# Patient Record
Sex: Female | Born: 1940
Health system: Southern US, Community
[De-identification: ages and names within clinical notes are randomized; demographics above are authoritative.]

## PROBLEM LIST (undated history)

## (undated) DIAGNOSIS — I480 Paroxysmal atrial fibrillation: Secondary | ICD-10-CM

## (undated) DIAGNOSIS — F419 Anxiety disorder, unspecified: Secondary | ICD-10-CM

## (undated) DIAGNOSIS — E079 Disorder of thyroid, unspecified: Secondary | ICD-10-CM

## (undated) DIAGNOSIS — J189 Pneumonia, unspecified organism: Secondary | ICD-10-CM

## (undated) DIAGNOSIS — F329 Major depressive disorder, single episode, unspecified: Secondary | ICD-10-CM

## (undated) DIAGNOSIS — G629 Polyneuropathy, unspecified: Secondary | ICD-10-CM

## (undated) DIAGNOSIS — C9110 Chronic lymphocytic leukemia of B-cell type not having achieved remission: Secondary | ICD-10-CM

## (undated) DIAGNOSIS — R112 Nausea with vomiting, unspecified: Secondary | ICD-10-CM

## (undated) DIAGNOSIS — F32A Depression, unspecified: Secondary | ICD-10-CM

## (undated) DIAGNOSIS — H3530 Unspecified macular degeneration: Secondary | ICD-10-CM

## (undated) DIAGNOSIS — I671 Cerebral aneurysm, nonruptured: Secondary | ICD-10-CM

## (undated) DIAGNOSIS — Z9889 Other specified postprocedural states: Secondary | ICD-10-CM

## (undated) DIAGNOSIS — M1990 Unspecified osteoarthritis, unspecified site: Secondary | ICD-10-CM

## (undated) HISTORY — DX: Paroxysmal atrial fibrillation: I48.0

## (undated) HISTORY — PX: COLONOSCOPY: SHX174

## (undated) HISTORY — DX: Cerebral aneurysm, nonruptured: I67.1

## (undated) HISTORY — PX: THYROIDECTOMY: SHX17

## (undated) HISTORY — PX: CATARACT EXTRACTION: SUR2

## (undated) HISTORY — PX: ANEURYSM COILING: SHX5349

## (undated) HISTORY — DX: Polyneuropathy, unspecified: G62.9

## (undated) HISTORY — DX: Unspecified macular degeneration: H35.30

## (undated) HISTORY — PX: CERVICAL CONE BIOPSY: SUR198

## (undated) HISTORY — PX: TONSILLECTOMY: SUR1361

---

## 1998-04-28 ENCOUNTER — Other Ambulatory Visit: Admission: RE | Admit: 1998-04-28 | Discharge: 1998-04-28 | Payer: Self-pay | Admitting: Obstetrics and Gynecology

## 1998-06-12 ENCOUNTER — Encounter: Payer: Self-pay | Admitting: Obstetrics and Gynecology

## 1998-06-13 ENCOUNTER — Other Ambulatory Visit: Admission: RE | Admit: 1998-06-13 | Discharge: 1998-06-13 | Payer: Self-pay | Admitting: Obstetrics and Gynecology

## 1998-06-14 ENCOUNTER — Ambulatory Visit (HOSPITAL_COMMUNITY): Admission: RE | Admit: 1998-06-14 | Discharge: 1998-06-14 | Payer: Self-pay | Admitting: Obstetrics and Gynecology

## 1998-09-11 ENCOUNTER — Other Ambulatory Visit: Admission: RE | Admit: 1998-09-11 | Discharge: 1998-09-11 | Payer: Self-pay | Admitting: Obstetrics and Gynecology

## 1998-12-14 ENCOUNTER — Other Ambulatory Visit: Admission: RE | Admit: 1998-12-14 | Discharge: 1998-12-14 | Payer: Self-pay | Admitting: Obstetrics and Gynecology

## 1999-03-12 ENCOUNTER — Other Ambulatory Visit: Admission: RE | Admit: 1999-03-12 | Discharge: 1999-03-12 | Payer: Self-pay | Admitting: Obstetrics and Gynecology

## 1999-06-05 ENCOUNTER — Other Ambulatory Visit: Admission: RE | Admit: 1999-06-05 | Discharge: 1999-06-05 | Payer: Self-pay | Admitting: Obstetrics and Gynecology

## 1999-07-04 ENCOUNTER — Encounter: Payer: Self-pay | Admitting: Obstetrics and Gynecology

## 1999-07-04 ENCOUNTER — Encounter: Admission: RE | Admit: 1999-07-04 | Discharge: 1999-07-04 | Payer: Self-pay | Admitting: Obstetrics and Gynecology

## 1999-12-24 ENCOUNTER — Other Ambulatory Visit: Admission: RE | Admit: 1999-12-24 | Discharge: 1999-12-24 | Payer: Self-pay | Admitting: Obstetrics and Gynecology

## 2000-06-09 ENCOUNTER — Other Ambulatory Visit: Admission: RE | Admit: 2000-06-09 | Discharge: 2000-06-09 | Payer: Self-pay | Admitting: Obstetrics and Gynecology

## 2000-07-31 ENCOUNTER — Encounter: Payer: Self-pay | Admitting: Obstetrics and Gynecology

## 2000-07-31 ENCOUNTER — Encounter: Admission: RE | Admit: 2000-07-31 | Discharge: 2000-07-31 | Payer: Self-pay | Admitting: Obstetrics and Gynecology

## 2000-08-05 ENCOUNTER — Encounter: Payer: Self-pay | Admitting: Obstetrics and Gynecology

## 2000-08-05 ENCOUNTER — Encounter: Admission: RE | Admit: 2000-08-05 | Discharge: 2000-08-05 | Payer: Self-pay | Admitting: Obstetrics and Gynecology

## 2001-02-12 ENCOUNTER — Emergency Department (HOSPITAL_COMMUNITY): Admission: EM | Admit: 2001-02-12 | Discharge: 2001-02-12 | Payer: Self-pay | Admitting: Emergency Medicine

## 2001-02-12 ENCOUNTER — Encounter: Payer: Self-pay | Admitting: Emergency Medicine

## 2001-02-15 ENCOUNTER — Emergency Department (HOSPITAL_COMMUNITY): Admission: EM | Admit: 2001-02-15 | Discharge: 2001-02-15 | Payer: Self-pay | Admitting: Emergency Medicine

## 2001-06-10 ENCOUNTER — Other Ambulatory Visit: Admission: RE | Admit: 2001-06-10 | Discharge: 2001-06-10 | Payer: Self-pay | Admitting: Obstetrics and Gynecology

## 2001-08-03 ENCOUNTER — Encounter: Payer: Self-pay | Admitting: *Deleted

## 2001-08-03 ENCOUNTER — Encounter: Admission: RE | Admit: 2001-08-03 | Discharge: 2001-08-03 | Payer: Self-pay | Admitting: *Deleted

## 2002-08-10 ENCOUNTER — Encounter: Payer: Self-pay | Admitting: Obstetrics and Gynecology

## 2002-08-10 ENCOUNTER — Encounter: Admission: RE | Admit: 2002-08-10 | Discharge: 2002-08-10 | Payer: Self-pay | Admitting: Obstetrics and Gynecology

## 2003-08-12 ENCOUNTER — Encounter: Admission: RE | Admit: 2003-08-12 | Discharge: 2003-08-12 | Payer: Self-pay | Admitting: Obstetrics and Gynecology

## 2004-08-14 ENCOUNTER — Encounter: Admission: RE | Admit: 2004-08-14 | Discharge: 2004-08-14 | Payer: Self-pay | Admitting: Obstetrics and Gynecology

## 2004-08-16 ENCOUNTER — Ambulatory Visit (HOSPITAL_COMMUNITY): Admission: RE | Admit: 2004-08-16 | Discharge: 2004-08-16 | Payer: Self-pay | Admitting: Gastroenterology

## 2005-08-15 ENCOUNTER — Encounter: Admission: RE | Admit: 2005-08-15 | Discharge: 2005-08-15 | Payer: Self-pay | Admitting: Obstetrics and Gynecology

## 2005-12-02 ENCOUNTER — Encounter: Admission: RE | Admit: 2005-12-02 | Discharge: 2005-12-02 | Payer: Self-pay | Admitting: Geriatric Medicine

## 2006-08-18 ENCOUNTER — Encounter: Admission: RE | Admit: 2006-08-18 | Discharge: 2006-08-18 | Payer: Self-pay | Admitting: Obstetrics and Gynecology

## 2007-08-04 ENCOUNTER — Ambulatory Visit (HOSPITAL_COMMUNITY): Admission: RE | Admit: 2007-08-04 | Discharge: 2007-08-04 | Payer: Self-pay | Admitting: Neurological Surgery

## 2007-08-10 ENCOUNTER — Encounter: Admission: RE | Admit: 2007-08-10 | Discharge: 2007-08-10 | Payer: Self-pay | Admitting: Neurological Surgery

## 2007-08-19 ENCOUNTER — Inpatient Hospital Stay (HOSPITAL_COMMUNITY): Admission: AD | Admit: 2007-08-19 | Discharge: 2007-08-21 | Payer: Self-pay | Admitting: Radiology

## 2007-08-28 ENCOUNTER — Encounter: Admission: RE | Admit: 2007-08-28 | Discharge: 2007-08-28 | Payer: Self-pay | Admitting: Obstetrics and Gynecology

## 2007-09-07 ENCOUNTER — Encounter: Admission: RE | Admit: 2007-09-07 | Discharge: 2007-09-07 | Payer: Self-pay | Admitting: Radiology

## 2008-04-06 ENCOUNTER — Encounter: Admission: RE | Admit: 2008-04-06 | Discharge: 2008-04-06 | Payer: Self-pay | Admitting: Radiology

## 2008-04-07 ENCOUNTER — Ambulatory Visit (HOSPITAL_COMMUNITY): Admission: RE | Admit: 2008-04-07 | Discharge: 2008-04-07 | Payer: Self-pay | Admitting: Radiology

## 2008-08-09 ENCOUNTER — Encounter: Admission: RE | Admit: 2008-08-09 | Discharge: 2008-08-09 | Payer: Self-pay | Admitting: Obstetrics and Gynecology

## 2008-12-21 ENCOUNTER — Encounter: Admission: RE | Admit: 2008-12-21 | Discharge: 2008-12-21 | Payer: Self-pay | Admitting: Internal Medicine

## 2009-06-02 LAB — HM PAP SMEAR

## 2009-08-03 ENCOUNTER — Encounter: Admission: RE | Admit: 2009-08-03 | Discharge: 2009-08-29 | Payer: Self-pay | Admitting: Ophthalmology

## 2009-08-10 ENCOUNTER — Encounter: Admission: RE | Admit: 2009-08-10 | Discharge: 2009-08-10 | Payer: Self-pay | Admitting: Obstetrics and Gynecology

## 2009-08-18 ENCOUNTER — Encounter: Admission: RE | Admit: 2009-08-18 | Discharge: 2009-08-18 | Payer: Self-pay | Admitting: Obstetrics and Gynecology

## 2009-12-13 ENCOUNTER — Encounter: Admission: RE | Admit: 2009-12-13 | Discharge: 2009-12-13 | Payer: Self-pay | Admitting: Radiology

## 2009-12-13 ENCOUNTER — Ambulatory Visit (HOSPITAL_COMMUNITY): Admission: RE | Admit: 2009-12-13 | Discharge: 2009-12-13 | Payer: Self-pay | Admitting: Radiology

## 2010-01-17 ENCOUNTER — Encounter: Payer: Self-pay | Admitting: Cardiovascular Disease

## 2010-01-18 ENCOUNTER — Encounter: Payer: Self-pay | Admitting: Cardiovascular Disease

## 2010-01-25 ENCOUNTER — Encounter: Payer: Self-pay | Admitting: Cardiovascular Disease

## 2010-02-14 ENCOUNTER — Encounter: Admission: RE | Admit: 2010-02-14 | Discharge: 2010-02-14 | Payer: Self-pay | Admitting: Obstetrics and Gynecology

## 2010-05-18 ENCOUNTER — Encounter: Payer: Self-pay | Admitting: Cardiovascular Disease

## 2010-05-31 ENCOUNTER — Ambulatory Visit: Payer: Self-pay | Admitting: Cardiovascular Disease

## 2010-05-31 DIAGNOSIS — I10 Essential (primary) hypertension: Secondary | ICD-10-CM

## 2010-05-31 DIAGNOSIS — I4891 Unspecified atrial fibrillation: Secondary | ICD-10-CM | POA: Insufficient documentation

## 2010-08-20 ENCOUNTER — Encounter
Admission: RE | Admit: 2010-08-20 | Discharge: 2010-08-20 | Payer: Self-pay | Source: Home / Self Care | Attending: Obstetrics and Gynecology | Admitting: Obstetrics and Gynecology

## 2010-10-04 NOTE — Assessment & Plan Note (Signed)
Summary: new afib   Visit Type:  Initial Consult Primary Provider:  Dr. Rodrigo Ran  CC:  Atrial Fibrillation.  History of Present Illness: This is a 70 year-old woman presenting for initial evaluation of paroxysmal atrial fibrillation. She has been followed by Frans[place] , M.D. at the [facility], but spends a significant amount of time in [place] and would like to establish care here as well. I follow her husband as well.  She has had 2 episodes of atrial fib with RVR dating back to May 2011. She has been anticoagulated with pradaxa and is treated with AV nodal blockers after intolerance to Multaq. 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   The patient feels well. She denies chest pain, dyspnea, orthopnea, PND, edema, or palpitations. She is active but not engaged in regular exercise.    Current Medications (verified): 1)  Toprol Xl 50 Mg Xr24h-Tab (Metoprolol Succinate) .... Take 1 Tablet By Mouth Two Times A Day 2)  Prempro 0.625-5 Mg Tabs (Conj Estrog-Medroxyprogest Ace) .... Take 1 Tablet By Mouth Once A Day 3)  Hydrochlorothiazide 25 Mg Tabs (Hydrochlorothiazide) .... Take One Tablet By Mouth Daily. 4)  Lisinopril 10 Mg Tabs (Lisinopril) .... Take One Tablet By Mouth Daily 5)  Pravastatin Sodium 20 Mg Tabs (Pravastatin Sodium) .... Take One Tablet By Mouth Daily At Bedtime 6)  Lorazepam 1 Mg Tabs (Lorazepam) .... Take 1 Tab By Mouth At Bedtime 7)  Pradaxa 150 Mg Caps (Dabigatran Etexilate Mesylate) .... Take 1 Capsule By Mouth Two Times A Day 8)  Cartia Xt 180 Mg Xr24h-Cap (Diltiazem Hcl Coated Beads) .... Take 1 Capsule By Mouth Once A Day 9)  Ocuvite  Tabs (Multiple Vitamins-Minerals) .... Take 1 Tablet By Mouth Once A Day 10)  Caltrate 600+d 600-400 Mg-Unit Tabs (Calcium Carbonate-Vitamin D) .... Take 1 Tablet By Mouth Two Times A Day 11)  Fish Oil 1000 Mg Caps (Omega-3  Fatty Acids) .... Take 2 Two Times A Day  Allergies (verified): 1)  ! Codeine  Past History:  Past medical, surgical, family and social histories (including risk factors) reviewed, and no changes noted (except as noted below).  Past Medical History: Paroxysmal atrial fibrillation, maintained on oral anticoagulation with Pradaxa Cerebral aneurysm s/p endovascular coiling 2008 Hx of hearing loss. Borderline diabetes Mild  hypertension Macular degeneration Neuropathy.   Past Surgical History: Reviewed history from 05/30/2010 and no changes required.  Significant for a thyroidectomy, cervical cone  surgery in 1975, bilateral cataract surgery.      Family History: Reviewed history from 05/30/2010 and no changes required.  The patient's mother died at age 58, possibly from a   cerebral aneurysm.  Her father died at age 77 of cancer.   Social History: Reviewed history from 05/30/2010 and no changes required.  The patient is married.  She and her husband live in   [place].  They have 2 adult children.  She quit smoking in 1990.  She  smoked a pack and half to 2 packs of cigarettes per day for 15 years.  She does not use alcohol to any significant extent.  She has been a  housewife.   Review of Systems       Negative except as per HPI   Vital Signs:  Patient profile:   70 year old female Height:      65 inches Weight:      167.25 pounds  BMI:     27.93 Pulse rate:   66 / minute Pulse rhythm:   regular Resp:     18 per minute BP sitting:   102 / 70  (left arm) Cuff size:   large  Vitals Entered By: Vikki Ports (May 31, 2010 9:28 AM)  Physical Exam  General:  Pt is well-developed, pleasant woman, alert and oriented, no acute distress HEENT: normal Neck: no thyromegaly           JVP normal, carotid upstrokes normal without bruits Lungs: CTA Chest: equal expansion  CV: Apical impulse nondisplaced, RRR without murmur or gallop Abd: soft, NT, positive BS, no  HSM, no bruit Back: no CVA tenderness Ext: no clubbing, cyanosis, or edema        femoral pulses 2+ without bruits        pedal pulses 2+ and equal Skin: warm, dry, no rash Neuro: CNII-XII intact,strength 5/5 = b/l    Echocardiogram  Procedure date:  01/25/2010  Findings:      Normal LV size and systolic fxn with LVEF 55-60%. Small secundum ASD. Normal RA/RV size. No significant valvular disease.  Moderate LAE.  Nuclear Study  Procedure date:  01/25/2010  Findings:      Normal myocardial perfusion with gated LVEF 79%, breast attenuation artifact.  EKG  Procedure date:  05/31/2010  Findings:      NSR 67 bpm, within normal limits.  Impression & Recommendations:  Problem # 1:  ATRIAL FIBRILLATION (ICD-427.31) The patient is maintaining sinus rhythm on a combination of Toprol and Cartia XT. She is anticoagulated with pradaxa. Recommend continuation of her current therapy without changes at this time. Her secundum ASD does not appear significant, and there is no indication for closure at this point. She is primarily followed by Dr Caryn Section, and I will plan on seeing her back on a yearly basis. She is at least a moderate social drinker and we discussed reduction in alcohol and the impact this can have on AF recurrence.  Her updated medication list for this problem includes:    Toprol Xl 50 Mg Xr24h-tab (Metoprolol succinate) .Marland Kitchen... Take 1 tablet by mouth two times a day  Orders: EKG w/ Interpretation (93000)  Problem # 2:  HYPERTENSION, BENIGN (ICD-401.1) Well controlled.  Her updated medication list for this problem includes:    Toprol Xl 50 Mg Xr24h-tab (Metoprolol succinate) .Marland Kitchen... Take 1 tablet by mouth two times a day    Hydrochlorothiazide 25 Mg Tabs (Hydrochlorothiazide) .Marland Kitchen... Take one tablet by mouth daily.    Lisinopril 10 Mg Tabs (Lisinopril) .Marland Kitchen... Take one tablet by mouth daily    Cartia Xt 180 Mg Xr24h-cap (Diltiazem hcl coated beads) .Marland Kitchen... Take 1 capsule by mouth once  a day  Orders: EKG w/ Interpretation (93000)  BP today: 102/70  Patient Instructions: 1)  Your physician recommends that you continue on your current medications as directed. Please refer to the Current Medication list given to you today. 2)  Your physician wants you to follow-up in: 1 YEAR.   You will receive a reminder letter in the mail two months in advance. If you don't receive a letter, please call our office to schedule the follow-up appointment.

## 2010-10-04 NOTE — Letter (Signed)
Summary: [facility] Office Visit Note   [facility] Office Visit Note   Imported By: Roderic Ovens 06/11/2010 16:50:55  _____________________________________________________________________  External Attachment:    Type:   Image     Comment:   External Document

## 2010-10-04 NOTE — Letter (Signed)
Summary: [facility] Office Visit Note   [facility] Office Visit Note   Imported By: Roderic Ovens 06/11/2010 16:51:36  _____________________________________________________________________  External Attachment:    Type:   Image     Comment:   External Document

## 2011-01-15 NOTE — Discharge Summary (Signed)
Bethany Oconnor, Bethany Oconnor NO.:  [id number]   MEDICAL RECORD NO.:  [id number]          PATIENT TYPE:  INP   LOCATION:  3114                         FACILITY:  [facility]   PHYSICIAN:  Marin Roberts, MDDATE OF BIRTH:  May 02, 1941   DATE OF ADMISSION:  08/19/2007  DATE OF DISCHARGE:  08/21/2007                               DISCHARGE SUMMARY   CHIEF COMPLAINT:  Cerebral aneurysm.   HISTORY OF PRESENT ILLNESS:  This is a pleasant 70 year old female who  was referred to Dr. Marin Roberts through the courtesy of Dr.  Barnett Abu after the patient was found to have a cerebral aneurysm.  The patient has a long history of hearing loss.  She was recently  evaluated by Dr. Suszanne Conners for hearing loss.  She had an MRI performed on  August 06, 2007,  that revealed an anterior communicating artery  aneurysm.  She subsequently had an MRA performed at [facility] on  July 23, 2007, which confirmed the presence of the anterior  communicating artery aneurysm.  A CT angiogram was also performed on  August 04, 2007, that revealed a windsock-type aneurysm emanating from  the expected location of the anterior communicating artery on the right.  There was no definite anterior communicating artery noted.  The aneurysm  measured approximately 10 mm x 4 mm and appeared to have a narrow neck.  It was not felt that the aneurysm was associated with the patient's  hearing loss.  She also reported some mild headaches over the years.  Again, these were not felt to be due to the aneurysm.   The patient was seen in consultation by Dr. Alfredo Batty on August 10, 2007.  She was accompanied by her husband.  Aneurysms were discussed in  general, including treatment options as well as the risks and benefits  of each.  A decision was made to proceed with endovascular coiling.  The  patient was admitted to [facility] on August 19, 2007, for  that procedure.   PAST MEDICAL HISTORY:   Significant for borderline diabetes, mild  hypertension, macular degeneration, peripheral vascular disease, and  neuropathy.   PAST SURGICAL HISTORY:  Significant for a thyroidectomy, cervical cone  surgery in 1975, bilateral cataract surgery.  The patient denies  previous problems with anesthesia.   ALLERGIES:  The patient is INTOLERANT TO CODEINE.   MEDICATIONS ON ADMISSION:  Included Prempro, hydrochlorothiazide,  lisinopril, Ocuvite and Plavix The patient had been started on Plavix  when seen in consultation by Dr. Alfredo Batty in case stenting was required  for the aneurysm.  She also received aspirin on the morning of  admission.   SOCIAL HISTORY:  The patient is married.  She and her husband live in  [place].  They have 2 adult children.  She quit smoking in 1990.  She  smoked a pack and half to 2 packs of cigarettes per day for 15 years.  She does not use alcohol to any significant extent.  She has been a  housewife.   FAMILY HISTORY:  The patient's mother died at age 34, possibly from a  cerebral aneurysm.  Her father died at age 73 of cancer.   HOSPITAL COURSE:  As noted, this patient was admitted to [facility] on August 19, 2007, by Dr. Alfredo Batty to undergo treatment of an  anterior communicating artery aneurysm.  A cerebral angiogram was  performed under conscious sedation followed by coiling of the aneurysm  under general anesthesia performed by Dr. Alfredo Batty.  There were no  immediate or known complications.   Following the procedure, the patient was admitted to the neuro intensive  care unit for close monitoring.  She did have a headache throughout the  night, which did require morphine.  She was seen by Dr. Alfredo Batty the  following day.  The patient was still experiencing headaches.  These  were treated with a Decadron tapering dose.   The patient's potassium was noted to be low.  This was supplemented.  The patient also had some mild nausea and vomiting.   This was treated  with Zofran.  Her symptoms gradually improved over the next day.  She  was kept in the hospital an additional day because of the above-noted  symptoms.  She was given Decadron 6 mg IV q.6 h during her stay with  plans for a tapering dose of prednisone after discharge.   The patient was seen by Dr. Alfredo Batty again on August 21, 2007.  She  appeared much improved at that time and arrangements were made for  discharge later that day.   LABORATORY DATA:  A potassium level on December 18 was 3.5.  A basic  metabolic panel on that morning revealed a potassium of 2.8, BUN of 7,  creatinine 0.79.  GFR was greater than 60.  Glucose was 96.  A CBC on  the 18th revealed hemoglobin 11.6, hematocrit 32.5, WBC 6.4 thousand,  platelets 177,000.  A basic metabolic panel on admission revealed BUN  13, creatinine 0.94.  Potassium was 4.1, glucose 107.  Her GFR was 59.  The patient had a CT scan of the head performed August 20, 2007,  secondary to her headaches.  This revealed satisfactory coiling of the  anterior cerebral artery aneurysm with no hemorrhage or acute  abnormality.   DISCHARGE INSTRUCTIONS:  The patient was told to resume her home  medications.  The Plavix which had been started prior to admission was  discontinued.  Her aspirin was changed to 81 mg daily.  She was to go  home on a tapering dose of prednisone, which was to consist of  prednisone 2 mg b.i.d. x2 days, then prednisone 1 mg b.i.d. x1 day.  After that the prednisone was to be stopped.   The patient was told to continue on her previous diet.  She was given  instructions regarding wound care.  She was told not to drive until seen  by Dr. Alfredo Batty in followup.  She was to avoid any strenuous activity for  at least 2 weeks.  She was to have a repeat cerebral angiogram in 6  months, a repeat MRA in 6 months.   The patient was told to follow up with her primary care physician in 1  to 2 weeks for a repeat  potassium level.  She would see Dr. Alfredo Batty on  Monday, January 5, at 12:45.   DIAGNOSES AT THE TIME OF DISCHARGE:  1. Incidental anterior communicating artery aneurysm estimated at 10      mm x 4 mm, status post endovascular coiling performed by Dr.      Alfredo Batty on  August 19, 2007 under general anesthesia.  2. Long history of hearing loss.  3. Borderline diabetes.  4. Mild hypertension.  5. History of macular degeneration.  6. Peripheral vascular disease.  7. History of neuropathy.  8. Status post multiple surgeries.  9. Remote tobacco history.  10.Hypokalemia, treated and improved.  11.Mild anemia.  12.Headaches, treated with a steroid taper with a negative head CT      this admission.      Delton See, P.A.      Marin Roberts, MD  Electronically Signed    DR/MEDQ  D:  08/21/2007  T:  08/22/2007  Job:  [id number]   cc:   Stefani Dama, M.D.  Hal T. Stoneking, M.D.  Newman Pies, MD

## 2011-01-15 NOTE — H&P (Signed)
Bethany Oconnor, Bethany Oconnor NO.:  [id number]   MEDICAL RECORD NO.:  [id number]          PATIENT TYPE:  OIB   LOCATION:  3172                         FACILITY:  [facility]   PHYSICIAN:  Marin Roberts, MDDATE OF BIRTH:  [birth year]   DATE OF ADMISSION:  08/19/2007  DATE OF DISCHARGE:                              HISTORY & PHYSICAL   CHIEF COMPLAINT:  Cerebral aneurysm.   HISTORY OF PRESENT ILLNESS:  This is a very pleasant 71 year old female  who was referred to Dr. Marin Roberts by Dr. Barnett Abu after  the patient was found to have a cerebral aneurysm.  The patient has had  hearing loss problems for 20-30 years.  She was recently evaluated by  Dr. Suszanne Conners for hearing loss.  She had an MRI performed on August 06, 2007.  This showed a possible anterior communicating artery aneurysm.  She subsequently had an MRA performed at [facility] on July 23, 2007, which confirmed the presence of an anterior communicating artery  aneurysm.  A CT angiogram was performed on August 04, 2007, that  revealed a wind sock-type aneurysm emanating from the expected location  of the anterior communicating artery on the right.  There was no  definite anterior communicating artery noted.  The aneurysm measured  approximately 10 mm x 4 mm and appeared to have a narrow neck.  It was  not felt that the aneurysm was connected with the patient's hearing  loss.  She also reported some mild headaches over the years.  Again,  these were not felt to be associated with the aneurysm itself.   The patient was seen in consultation by Dr. Alfredo Batty on August 10, 2007.  She was accompanied by her husband at that time.  Aneurysms in general  were discussed along with treatment options, including conservative  monitoring, open craniotomy and endovascular coiling and/or stenting.  The risks and benefits of each option were also discussed.  The patient  and her husband have decided to proceed with  coiling.  She is admitted  to [facility] today for the procedure.   PAST MEDICAL HISTORY:  1. Borderline diabetes.  2. Mild hypertension.  3. Macular .  4. Peripheral vascular disease.  5. Neuropathy.   PAST SURGICAL HISTORY:  1. Thyroidectomy.  2. Cervical cone surgery in 1975.  3. Bilateral cataract surgery.  The patient denies any previous      problems with anesthesia.   ALLERGIES:  The patient is intolerant to codeine.   MEDICATIONS ON ADMISSION:  Prempro, hydrochlorothiazide, lisinopril,  Ocuvite and Plavix.  The patient was started on Plavix when she was seen  in consultation by Dr. Alfredo Batty.  She also received aspirin at time of  admission this morning.   SOCIAL HISTORY:  The patient is married.  She and her husband live in  [place].  They have two adult children.  She quit smoking in 1990.  She did smoke one and one half to two pack cigarettes per day for 15  years.  She does not use alcohol to any significant extent.  She has  been a  housewife.   FAMILY HISTORY:  Mother died at age 9, possibly from a cerebral  aneurysm.  Her father died at age 63 from cancer.   LABORATORY DATA:  An INR is 1.0, PTT is 24.  CBC reveals hemoglobin  13.8, hematocrit 39.3, WBC 6,600, platelets 230,000, BUN 13, creatinine  0.94.  Her GFR is greater than 59, potassium 4.1, glucose 107.   REVIEW OF SYSTEMS:  Completely negative except for some recent diarrhea  which she feels is related to anxiety.  She is very hard of hearing.  She wears bilateral hearing aids.  She also reports that she bruises  easily.  The patient denies having diabetes.  The remainder of the  review of systems was negative.   PHYSICAL EXAMINATION:  GENERAL:  A pleasant but anxious 70 year old  white female in no acute distress.  VITAL SIGNS:  Blood pressure 148/88, pulse 88, respirations 18,  temperature 97.7, oxygen saturation 97% on room air.  HEENT is unremarkable.  NECK:  Reveals no bruits.   HEART:  Reveals regular rate and rhythm without murmur.  LUNGS:  Clear.  ABDOMEN:  Soft, nontender.  EXTREMITIES:  Reveal pulses to be intact, although the pulses in the  right lower extremity appear to be stronger than the left.  There is no  significant edema.  NEUROLOGICAL:  Exam is within normal limits except for the fact that her  tongue deviates to the left when protruded.  Her motor strength is  estimated to be 4-5/5 throughout.  Her airway was rated at a 2. ASA  scale was a 2.   IMPRESSION:  1. Anterior cerebral artery aneurysm, unruptured and asymptomatic.  2. Hearing loss times 20-30 years with bilateral hearing aids.  3. Questionable borderline diabetes.  4. Macular degeneration.  5. History of peripheral vascular disease with neuropathy.  6. History of mild hypertension.  7. Status post multiple surgeries as noted above.  8. Remote tobacco history.   PLAN:  As noted, the patient is being admitted to [facility]  today by Dr. Alfredo Batty for treatment of her cerebral aneurysm.  She will  first undergo a cerebral angiogram for further evaluation followed by  coiling of the aneurysm under general anesthesia if felt to be safe and  indicated.      Delton See, P.A.      Marin Roberts, MD  Electronically Signed    DR/MEDQ  D:  08/19/2007  T:  08/19/2007  Job:  [id number]   cc:   Stefani Dama, M.D.  Hal T. Stoneking, M.D.  Newman Pies, MD

## 2011-01-18 NOTE — Op Note (Signed)
NAME:  Bethany Oconnor, Bethany Oconnor NO.:  [id number]   MEDICAL RECORD NO.:  [id number]          PATIENT TYPE:  AMB   LOCATION:  ENDO                         FACILITY:  [facility]   PHYSICIAN:  Danise Edge, M.D.   DATE OF BIRTH:  1940-12-17   DATE OF PROCEDURE:  08/16/2004  DATE OF DISCHARGE:                                 OPERATIVE REPORT   PROCEDURE:  Colonoscopy.   INDICATIONS FOR PROCEDURE:  Mr. Bethany Oconnor is a 70 year old female born  1941-03-12.  Bethany Oconnor submitted stool Hemoccult cards to Dr. Hilbert Bible; 2 of 3 stool Hemoccult cards returned positive.   ENDOSCOPIST:  Danise Edge, M.D.   PREMEDICATION:  Versed 7.5 mg, Demerol 70 mg.   DESCRIPTION OF PROCEDURE:  After obtaining informed consent, Bethany Oconnor was  placed in the left lateral decubitus position. I administered intravenous  Demerol and intravenous Versed to achieve conscious sedation for the  procedure. The patient's blood pressure, oxygen saturation and cardiac  rhythm were monitored throughout the procedure and documented in the medical  record.   Anal inspection and digital rectal exam were normal. The Olympus adjustable  pediatric colonoscope was introduced into the rectum and advanced to the  cecum. Colonic preparation for the exam today was satisfactory.   RECTUM:  Normal.   SIGMOID COLON AND DESCENDING COLON:  Colonic diverticulosis.   SPLENIC FLEXURE:  Normal.   TRANSVERSE COLON:  Normal.   HEPATIC FLEXURE:  Normal.   ASCENDING COLON:  Normal.   CECUM AND ILEOCECAL VALVE:  Normal.   ASSESSMENT:  Normal diagnostic proctocolonoscopy to the cecum.  Colonic  diverticulosis are noted in the left colon. No endoscopic evidence for the  presence of colorectal neoplasia.      MJ/MEDQ  D:  08/16/2004  T:  08/16/2004  Job:  [id number]   cc:   Malachi Pro. Ambrose Mantle, M.D.  [address]  [place] [address]  Fax: [phone]   Hal T. Stoneking, M.D.  [address] [place], [place] [address]  Fax: [phone]

## 2011-05-15 ENCOUNTER — Other Ambulatory Visit: Payer: Self-pay | Admitting: *Deleted

## 2011-05-15 MED ORDER — DILTIAZEM HCL ER COATED BEADS 180 MG PO CP24: 180.0000 mg | ORAL_CAPSULE | Freq: Every day | ORAL | Status: DC

## 2011-05-15 MED ORDER — DABIGATRAN ETEXILATE MESYLATE 150 MG PO CAPS: 150.0000 mg | ORAL_CAPSULE | Freq: Two times a day (BID) | ORAL | Status: DC

## 2011-05-31 LAB — BASIC METABOLIC PANEL
Calcium: 9.2
Creatinine, Ser: 0.84
Potassium: 3.5
Sodium: 140

## 2011-05-31 LAB — CBC
MCHC: 34.1
MCV: 99.3
Platelets: 193
RBC: 3.77 — ABNORMAL LOW
RDW: 13.4
WBC: 6.8

## 2011-06-07 LAB — DIFFERENTIAL
Basophils Absolute: 0
Basophils Relative: 0
Eosinophils Absolute: 0.1 — ABNORMAL LOW
Lymphocytes Relative: 33
Monocytes Absolute: 0.6
Monocytes Relative: 9
Neutrophils Relative %: 56

## 2011-06-07 LAB — PROTIME-INR
INR: 1
Prothrombin Time: 13.1
Prothrombin Time: 13.3

## 2011-06-07 LAB — CBC
HCT: 32.5 — ABNORMAL LOW
Hemoglobin: 11.6 — ABNORMAL LOW
Hemoglobin: 13.8
MCHC: 35.6
MCV: 95
Platelets: 177
RBC: 4.14
RDW: 13.4
WBC: 6.6

## 2011-06-07 LAB — BASIC METABOLIC PANEL
CO2: 25
CO2: 27
Calcium: 9.4
GFR calc Af Amer: >60
GFR calc non Af Amer: 59 — ABNORMAL LOW
Glucose, Bld: 96
Potassium: 4.1

## 2011-06-07 LAB — APTT: aPTT: 24

## 2011-06-10 ENCOUNTER — Other Ambulatory Visit: Payer: Self-pay | Admitting: Obstetrics and Gynecology

## 2011-06-10 ENCOUNTER — Encounter: Payer: Self-pay | Admitting: Cardiovascular Disease

## 2011-06-10 DIAGNOSIS — Z1231 Encounter for screening mammogram for malignant neoplasm of breast: Secondary | ICD-10-CM

## 2011-06-10 LAB — CREATININE, SERUM: GFR calc non Af Amer: 52 — ABNORMAL LOW

## 2011-06-11 ENCOUNTER — Ambulatory Visit (INDEPENDENT_AMBULATORY_CARE_PROVIDER_SITE_OTHER): Payer: BC Managed Care – HMO | Admitting: Cardiovascular Disease

## 2011-06-11 ENCOUNTER — Encounter: Payer: Self-pay | Admitting: Cardiovascular Disease

## 2011-06-11 VITALS — BP 100/60 | HR 58 | Resp 18 | Ht 65.0 in | Wt 161.0 lb

## 2011-06-11 DIAGNOSIS — I10 Essential (primary) hypertension: Secondary | ICD-10-CM

## 2011-06-11 DIAGNOSIS — I4891 Unspecified atrial fibrillation: Secondary | ICD-10-CM

## 2011-06-11 NOTE — Patient Instructions (Signed)
Your physician wants you to follow-up in: 6 MONTHS.  You will receive a reminder letter in the mail two months in advance. If you don't receive a letter, please call our office to schedule the follow-up appointment.  Your physician recommends that you continue on your current medications as directed. Please refer to the Current Medication list given to you today.  

## 2011-06-16 ENCOUNTER — Encounter: Payer: Self-pay | Admitting: Cardiovascular Disease

## 2011-06-16 NOTE — Assessment & Plan Note (Signed)
Blood pressure is well-controlled on multidrug therapy with diltiazem, hydrochlorothiazide, lisinopril, and metoprolol.

## 2011-06-16 NOTE — Progress Notes (Signed)
HPI:  This is a 70 year old woman presented for followup evaluation. She is followed for paroxysmal atrial fibrillation, hypertension, and small secundum ASD. She underwent a 2-D echocardiogram and a nuclear stress test last year with no significant abnormalities other than a small secundum ASD with normal right heart size.    She has paroxysmal atrial fib and was intolerant to multaq. She is anticoagulated with pradaxa. She has remained in sinus rhythm without anti-dysrhythmic therapy.  Overall she is doing well. She denies chest pain, palpitations, or dyspnea. Her main problems of late have been related to vision loss related to macular degeneration.  Outpatient Encounter Prescriptions as of 06/11/2011  Medication Sig Dispense Refill  . beta carotene w/minerals (OCUVITE) tablet Take 1 tablet by mouth daily.        . Calcium Carbonate-Vit D-Min (CALTRATE 600+D PLUS) 600-400 MG-UNIT per tablet Take 1 tablet by mouth 2 (two) times daily.        Marland Kitchen conjugated estrogens-medroxyprogesteron (PREMPHASE) TABS Take 1 tablet by mouth daily.        . dabigatran (PRADAXA) 150 MG CAPS Take 1 capsule (150 mg total) by mouth every 12 (twelve) hours.  180 capsule  2  . diltiazem (CARDIZEM CD) 180 MG 24 hr capsule Take 1 capsule (180 mg total) by mouth daily.  90 capsule  2  . hydrochlorothiazide (HYDRODIURIL) 25 MG tablet Take 25 mg by mouth daily.        Marland Kitchen lisinopril (PRINIVIL,ZESTRIL) 10 MG tablet Take 10 mg by mouth daily.        Marland Kitchen LORazepam (ATIVAN) 1 MG tablet Take 1 mg by mouth at bedtime.        . metoprolol (TOPROL-XL) 50 MG 24 hr tablet Take 50 mg by mouth 2 (two) times daily.        Marland Kitchen omega-3 acid ethyl esters (LOVAZA) 1 G capsule Take 1 g by mouth 4 (four) times daily.        . pravastatin (PRAVACHOL) 20 MG tablet Take 20 mg by mouth daily.        Marland Kitchen DISCONTD: Omega-3 Fatty Acids (FISH OIL) 1000 MG CAPS Take by mouth.          Allergies  Allergen Reactions  . Codeine     Past Medical History    Diagnosis Date  . Paroxysmal atrial fibrillation   . Cerebral aneurysm     s/p endovascular colling 2008  . Hearing loss   . Diabetes mellitus     borderline  . Hypertension     mild  . Macular degeneration   . Neuropathy     ROS: Negative except as per HPI  BP 100/60  Pulse 58  Resp 18  Ht 5\' 5"  (1.651 m)  Wt 161 lb (73.029 kg)  BMI 26.79 kg/m2  PHYSICAL EXAM: Pt is alert and oriented, NAD HEENT: normal Neck: JVP - normal, carotids 2+= without bruits Lungs: CTA bilaterally CV: RRR without murmur or gallop Abd: soft, NT, Positive BS, no hepatomegaly Ext: no C/C/E, distal pulses intact and equal Skin: warm/dry no rash  EKG:  Sinus bradycardia 58 beats per minute, otherwise within normal limits.  ASSESSMENT AND PLAN:

## 2011-06-16 NOTE — Assessment & Plan Note (Signed)
The patient is maintaining sinus rhythm. She is anticoagulated with pradaxa. She will continue her current medical therapy.

## 2011-07-02 ENCOUNTER — Telehealth: Payer: Self-pay | Admitting: Cardiovascular Disease

## 2011-07-02 NOTE — Telephone Encounter (Signed)
The pt said Dr Perini's office called to ask if she was taking Pradaxa because it was not on her medication list.  She made them aware that she does take this medication. The pt wanted to know if she is suppose to be taking this medication.  I made the pt aware that she should be on this medication due to Atrial Fib.

## 2011-07-02 NOTE — Telephone Encounter (Signed)
Pt said she got a call from dr perini about pradaxa please call

## 2011-07-04 ENCOUNTER — Ambulatory Visit
Admission: RE | Admit: 2011-07-04 | Discharge: 2011-07-04 | Disposition: A | Discharge status: Home / Self Care | Payer: BC Managed Care – HMO | Source: Ambulatory Visit | Attending: Internal Medicine | Admitting: Internal Medicine

## 2011-07-04 ENCOUNTER — Other Ambulatory Visit: Payer: Self-pay | Admitting: Internal Medicine

## 2011-07-04 DIAGNOSIS — M542 Cervicalgia: Secondary | ICD-10-CM

## 2011-07-17 LAB — HM MAMMOGRAPHY: HM MAMMO: NORMAL (ref 0–4)

## 2011-08-22 ENCOUNTER — Ambulatory Visit
Admission: RE | Admit: 2011-08-22 | Discharge: 2011-08-22 | Disposition: A | Discharge status: Home / Self Care | Payer: BC Managed Care – HMO | Source: Ambulatory Visit | Attending: Obstetrics and Gynecology | Admitting: Obstetrics and Gynecology

## 2011-08-22 DIAGNOSIS — Z1231 Encounter for screening mammogram for malignant neoplasm of breast: Secondary | ICD-10-CM

## 2011-11-13 ENCOUNTER — Telehealth: Payer: Self-pay | Admitting: Pulmonary Disease

## 2011-11-13 NOTE — Telephone Encounter (Signed)
lmomtcb x1 

## 2011-11-14 NOTE — Telephone Encounter (Signed)
lmomtcb x 2  

## 2011-11-15 NOTE — Telephone Encounter (Signed)
LMTCB x 3 

## 2011-11-18 NOTE — Telephone Encounter (Signed)
We have attempted to contact pt x 3.  Per protocol, will sign off on message and wait for pt to call back.

## 2011-11-27 ENCOUNTER — Other Ambulatory Visit (HOSPITAL_COMMUNITY): Payer: Self-pay | Admitting: Radiology

## 2011-11-27 ENCOUNTER — Other Ambulatory Visit: Payer: Self-pay | Admitting: Radiology

## 2011-11-27 DIAGNOSIS — I671 Cerebral aneurysm, nonruptured: Secondary | ICD-10-CM

## 2011-12-18 ENCOUNTER — Telehealth: Payer: Self-pay | Admitting: Emergency Medicine

## 2011-12-18 ENCOUNTER — Other Ambulatory Visit: Payer: Self-pay | Admitting: Emergency Medicine

## 2011-12-18 DIAGNOSIS — F40240 Claustrophobia: Secondary | ICD-10-CM

## 2011-12-18 MED ORDER — DIAZEPAM 10 MG PO TABS: 10.0000 mg | ORAL_TABLET | Freq: Once | ORAL | Status: AC

## 2011-12-18 NOTE — Telephone Encounter (Signed)
CALLED [facility] AT [facility] [address] AT [phone]

## 2011-12-31 ENCOUNTER — Ambulatory Visit (INDEPENDENT_AMBULATORY_CARE_PROVIDER_SITE_OTHER): Payer: BC Managed Care – HMO | Admitting: Cardiovascular Disease

## 2011-12-31 ENCOUNTER — Encounter: Payer: Self-pay | Admitting: Cardiovascular Disease

## 2011-12-31 ENCOUNTER — Ambulatory Visit: Payer: BC Managed Care – HMO | Admitting: Cardiovascular Disease

## 2011-12-31 VITALS — BP 114/69 | HR 72 | Ht 65.0 in | Wt 162.0 lb

## 2011-12-31 DIAGNOSIS — I4891 Unspecified atrial fibrillation: Secondary | ICD-10-CM

## 2011-12-31 NOTE — Patient Instructions (Signed)
Your physician wants you to follow-up in: 6 months with Dr. Cooper.  You will receive a reminder letter in the mail two months in advance. If you don't receive a letter, please call our office to schedule the follow-up appointment.   

## 2012-01-01 ENCOUNTER — Encounter: Payer: Self-pay | Admitting: Cardiovascular Disease

## 2012-01-01 NOTE — Progress Notes (Signed)
HPI:  This is a 71 year old woman presenting for followup. She has paroxysmal atrial fibrillation, hypertension, and a small secundum ASD. She's had normal right heart size and so her AST has been managed conservatively. The patient is anticoagulated with pradaxa. She recently developed shortness of breath and chest pain and was diagnosed with a pneumothorax while traveling in [place]. She was hospitalized for a few weeks and had a chest tube in place. She sounds like she was very sick at the time, but did not report any cardiac problems. Specifically, she did not have atrial fibrillation during her recent illness. She has mild residual shortness of breath but feels much better. She denies chest pain or pressure. She denies leg swelling. She's had no recent palpitations.  Outpatient Encounter Prescriptions as of 12/31/2011  Medication Sig Dispense Refill  . beta carotene w/minerals (OCUVITE) tablet Take 1 tablet by mouth daily.        . Calcium Carbonate-Vit D-Min (CALTRATE 600+D PLUS) 600-400 MG-UNIT per tablet Take 1 tablet by mouth 2 (two) times daily.        Marland Kitchen conjugated estrogens-medroxyprogesteron (PREMPHASE) TABS Take 1 tablet by mouth daily.        . dabigatran (PRADAXA) 150 MG CAPS Take 1 capsule (150 mg total) by mouth every 12 (twelve) hours.  180 capsule  2  . diazepam (VALIUM) 10 MG tablet Take 1 tablet (10 mg total) by mouth once. Take 1hr prior to MRI  1 tablet  0  . diltiazem (CARDIZEM CD) 180 MG 24 hr capsule Take 1 capsule (180 mg total) by mouth daily.  90 capsule  2  . hydrochlorothiazide (HYDRODIURIL) 25 MG tablet Take 25 mg by mouth daily.        Marland Kitchen lisinopril (PRINIVIL,ZESTRIL) 10 MG tablet Take 10 mg by mouth daily.        Marland Kitchen LORazepam (ATIVAN) 1 MG tablet Take 1 mg by mouth at bedtime.        . metoprolol (TOPROL-XL) 50 MG 24 hr tablet Take by mouth daily.       Marland Kitchen nystatin-triamcinolone (MYCOLOG II) cream As directed      . omega-3 acid ethyl esters (LOVAZA) 1 G capsule Take  1 g by mouth 4 (four) times daily.        . pravastatin (PRAVACHOL) 20 MG tablet Take 20 mg by mouth daily.        Marland Kitchen VIIBRYD 40 MG TABS 1 /2 tab daily        Allergies  Allergen Reactions  . Codeine     Past Medical History  Diagnosis Date  . Paroxysmal atrial fibrillation   . Cerebral aneurysm     s/p endovascular colling 2008  . Hearing loss   . Diabetes mellitus     borderline  . Hypertension     mild  . Macular degeneration   . Neuropathy     ROS: Negative except as per HPI  BP 114/69  Pulse 72  Ht 5\' 5"  (1.651 m)  Wt 73.483 kg (162 lb)  BMI 26.96 kg/m2  PHYSICAL EXAM: Pt is alert and oriented, NAD HEENT: normal Neck: JVP - normal, carotids 2+= without bruits Lungs: CTA bilaterally CV: RRR without murmur or gallop Abd: soft, NT, Positive BS, no hepatomegaly Ext: no C/C/E, distal pulses intact and equal Skin: warm/dry no rash  EKG:  Normal sinus rhythm 72 beats per minute, rightward axis, otherwise within normal limits.  ASSESSMENT AND PLAN: 1. Paroxysmal atrial fibrillation. The patient continues  to tolerate oral anticoagulation without bleeding problems. Remarkably it sounds like she did not have any atrial fibrillation with a recent pneumothorax and associated hospitalization.  2. Hypertension. Patient's blood pressure is well controlled on multidrug therapy.  3. Followup. I would like to see the patient back in 6 months unless cardiac problems arise in the interim.  Tonny Bollman, MD 01/01/2012

## 2012-01-06 ENCOUNTER — Ambulatory Visit (HOSPITAL_COMMUNITY)
Admission: RE | Admit: 2012-01-06 | Discharge: 2012-01-06 | Disposition: A | Discharge status: Home / Self Care | Payer: MEDICARE | Source: Ambulatory Visit | Attending: Radiology | Admitting: Radiology

## 2012-01-06 ENCOUNTER — Ambulatory Visit
Admission: RE | Admit: 2012-01-06 | Discharge: 2012-01-06 | Disposition: A | Discharge status: Home / Self Care | Payer: Medicare Other | Source: Ambulatory Visit | Attending: Radiology | Admitting: Radiology

## 2012-01-06 DIAGNOSIS — I671 Cerebral aneurysm, nonruptured: Secondary | ICD-10-CM

## 2012-01-06 NOTE — Progress Notes (Signed)
Patient ID: Bethany Oconnor, female   DOB: 1940-11-11, 71 y.o.   MRN: [id number]          Established Patient Office Visit - Level II - [phone]  Referring physician: Dr. Danielle Dess  Primary care physician: Dr. 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 She remains free of any complaints related to the aneurysm. She now has headaches or visual disturbances. There is no diplopia. She does suffer from macular degeneration. She well as recently treated for a spontaneous pneumothorax.  Objective: Blood pressure 137/70. Pulse 77. Temperature 97.1.  The review of her MRA demonstrates a tiny, 1 mm recurrence at the coiling site. This does appear slightly different than on prior studies. This could in part be technique related. No significant recurrence is evident.  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 Given the stability of this over time and only a very tiny signal abnormality, this is unlikely to be a significant recurrence. I will recheck an MRA and 2 years and seizure at that time. She is return within the symptoms of headache or visual changes otherwise prior to that time.

## 2012-01-30 ENCOUNTER — Telehealth: Payer: Self-pay | Admitting: Cardiovascular Disease

## 2012-01-30 MED ORDER — DABIGATRAN ETEXILATE MESYLATE 150 MG PO CAPS: 150.0000 mg | ORAL_CAPSULE | Freq: Two times a day (BID) | ORAL | Status: DC

## 2012-01-30 MED ORDER — DILTIAZEM HCL ER COATED BEADS 180 MG PO CP24: 180.0000 mg | ORAL_CAPSULE | Freq: Every day | ORAL | Status: DC

## 2012-01-30 NOTE — Telephone Encounter (Signed)
Please return call to patient at [phone] regarding a different RX for Diltiazem and Pradaxa.

## 2012-01-30 NOTE — Telephone Encounter (Signed)
I spoke with the pt and she needs to get refills on Diltiazem and Pradaxa sent to [facility]. Prescriptions sent through [facility].

## 2012-02-21 ENCOUNTER — Ambulatory Visit: Payer: Medicare Other | Attending: Ophthalmology | Admitting: Occupational Therapy

## 2012-02-21 DIAGNOSIS — H3530 Unspecified macular degeneration: Secondary | ICD-10-CM | POA: Insufficient documentation

## 2012-02-21 DIAGNOSIS — H547 Unspecified visual loss: Secondary | ICD-10-CM | POA: Insufficient documentation

## 2012-02-21 DIAGNOSIS — IMO0001 Reserved for inherently not codable concepts without codable children: Secondary | ICD-10-CM | POA: Insufficient documentation

## 2012-04-07 ENCOUNTER — Other Ambulatory Visit: Payer: Self-pay

## 2012-04-07 MED ORDER — DABIGATRAN ETEXILATE MESYLATE 150 MG PO CAPS: 150.0000 mg | ORAL_CAPSULE | Freq: Two times a day (BID) | ORAL | Status: DC

## 2012-05-19 ENCOUNTER — Ambulatory Visit (INDEPENDENT_AMBULATORY_CARE_PROVIDER_SITE_OTHER): Payer: BC Managed Care – HMO | Admitting: Cardiovascular Disease

## 2012-05-19 ENCOUNTER — Encounter: Payer: Self-pay | Admitting: Cardiovascular Disease

## 2012-05-19 VITALS — BP 120/64 | HR 74 | Ht 65.0 in | Wt 165.0 lb

## 2012-05-19 DIAGNOSIS — I4891 Unspecified atrial fibrillation: Secondary | ICD-10-CM

## 2012-05-19 DIAGNOSIS — Q211 Atrial septal defect: Secondary | ICD-10-CM

## 2012-05-19 DIAGNOSIS — I10 Essential (primary) hypertension: Secondary | ICD-10-CM

## 2012-05-19 NOTE — Progress Notes (Signed)
HPI:  71 year old woman presenting for followup evaluation. The patient has paroxysmal atrial fibrillation and a small ostium secundum ASD. She has been managed medically. She's chronically anticoagulated with Pradaxa, and she has no history of bleeding problems. She's been under a lot of stress with her husband's health difficulties recently, but she feels better since things have evened out for him. She denies chest pain, chest pressure, shortness of breath, palpitations, or leg swelling. She's been compliant with her medications. She has lab work about twice per year that Dr. Laurey Morale office.  Outpatient Encounter Prescriptions as of 05/19/2012  Medication Sig Dispense Refill  . beta carotene w/minerals (OCUVITE) tablet Take 1 tablet by mouth daily.        . Calcium Carbonate-Vit D-Min (CALTRATE 600+D PLUS) 600-400 MG-UNIT per tablet Take 1 tablet by mouth 2 (two) times daily.        Marland Kitchen conjugated estrogens-medroxyprogesteron (PREMPHASE) TABS Take 1 tablet by mouth daily.        . dabigatran (PRADAXA) 150 MG CAPS Take 1 capsule (150 mg total) by mouth every 12 (twelve) hours.  180 capsule  3  . diltiazem (CARDIZEM CD) 180 MG 24 hr capsule Take 1 capsule (180 mg total) by mouth daily.  90 capsule  3  . hydrochlorothiazide (HYDRODIURIL) 25 MG tablet Take 25 mg by mouth daily.        Marland Kitchen lisinopril (PRINIVIL,ZESTRIL) 10 MG tablet Take 10 mg by mouth daily.        Marland Kitchen LORazepam (ATIVAN) 1 MG tablet Take 1 mg by mouth at bedtime.        . metoprolol (TOPROL-XL) 50 MG 24 hr tablet Take by mouth daily.       Marland Kitchen nystatin-triamcinolone (MYCOLOG II) cream As directed      . omega-3 acid ethyl esters (LOVAZA) 1 G capsule Take 1 g by mouth 4 (four) times daily.        . pravastatin (PRAVACHOL) 20 MG tablet Take 20 mg by mouth daily.        Marland Kitchen VIIBRYD 20 MG TABS Take 1 tablet by mouth daily.      Marland Kitchen DISCONTD: VIIBRYD 40 MG TABS 1 /2 tab daily        Allergies  Allergen Reactions  . Codeine Other (See  Comments)    About 50 years ago, took Codeine for scratched cornea while pregnant, and it made her extremely sedated.    Past Medical History  Diagnosis Date  . Paroxysmal atrial fibrillation   . Cerebral aneurysm     s/p endovascular colling 2008  . Hearing loss   . Diabetes mellitus     borderline  . Hypertension     mild  . Macular degeneration   . Neuropathy     ROS: Negative except as per HPI  BP 120/64  Pulse 74  Ht 5\' 5"  (1.651 m)  Wt 74.844 kg (165 lb)  BMI 27.46 kg/m2  PHYSICAL EXAM: Pt is alert and oriented, NAD HEENT: normal Neck: JVP - normal, carotids 2+= without bruits Lungs: CTA bilaterally CV: RRR without murmur or gallop Abd: soft, NT, Positive BS, no hepatomegaly Ext: no C/C/E, distal pulses intact and equal Skin: warm/dry no rash  EKG:  Normal sinus rhythm 74 beats per minute, rightward axis, otherwise within normal limits  ASSESSMENT AND PLAN: 1. Paroxysmal atrial fibrillation. She remains in sinus rhythm. We'll continue her current medical program. She has upcoming blood work and we will request that her labs be sent  just to confirm that her creatinine clearance is normal since she is anticoagulated with dabigatran.   2. Ostium secundum ASD. She's been asymptomatic and does not have evidence of right heart volume overload. She's had an outside echocardiogram, but no previous studies in our system. I would like to check an echo when she returns in 6 months.  3. Hypertension. Blood pressure well controlled on a combination of diltiazem, lisinopril, metoprolol, and hydrochlorothiazide.

## 2012-05-19 NOTE — Patient Instructions (Signed)
Your physician wants you to follow-up in: 6 MONTHS. You will receive a reminder letter in the mail two months in advance. If you don't receive a letter, please call our office to schedule the follow-up appointment.  Your physician has requested that you have an echocardiogram in 6 MONTHS. Echocardiography is a painless test that uses sound waves to create images of your heart. It provides your doctor with information about the size and shape of your heart and how well your heart's chambers and valves are working. This procedure takes approximately one hour. There are no restrictions for this procedure.  Your physician recommends that you continue on your current medications as directed. Please refer to the Current Medication list given to you today.  

## 2012-06-12 ENCOUNTER — Encounter: Payer: Self-pay | Admitting: Cardiovascular Disease

## 2012-07-02 ENCOUNTER — Other Ambulatory Visit: Payer: Self-pay | Admitting: Obstetrics and Gynecology

## 2012-07-02 DIAGNOSIS — Z1231 Encounter for screening mammogram for malignant neoplasm of breast: Secondary | ICD-10-CM

## 2012-08-24 ENCOUNTER — Ambulatory Visit
Admission: RE | Admit: 2012-08-24 | Discharge: 2012-08-24 | Disposition: A | Discharge status: Home / Self Care | Payer: Medicare Other | Source: Ambulatory Visit | Attending: Obstetrics and Gynecology | Admitting: Obstetrics and Gynecology

## 2012-08-24 DIAGNOSIS — Z1231 Encounter for screening mammogram for malignant neoplasm of breast: Secondary | ICD-10-CM

## 2012-11-02 LAB — HM COLONOSCOPY

## 2012-11-30 ENCOUNTER — Telehealth: Payer: Self-pay | Admitting: Emergency Medicine

## 2012-11-30 NOTE — Telephone Encounter (Signed)
PER PT, THE IMAGING FACILITY IN [place] NEEDS TO KNOW WHAT TYPE OF HARDWARE THE PT HAS IN HER BRAIN.  \ I FAXED THE REPORT TO THEM LAST WEEK TO F# [phone]  TODAY I LEFT A MESSAGE FOR MELISSA ON VM TO MAKE HER AWARE THAT ALL OF OUR HARDWARE ARE MRI COMPATIBLE AND HER COILS ARE MADE OF TITANIUM.  THEY ARE GOOD FOR ANY TESLA 3 OR LESS MRI SCANNER.

## 2012-12-24 ENCOUNTER — Other Ambulatory Visit: Payer: Self-pay | Admitting: Cardiovascular Disease

## 2012-12-25 ENCOUNTER — Other Ambulatory Visit: Payer: Self-pay | Admitting: Internal Medicine

## 2012-12-25 DIAGNOSIS — S335XXA Sprain of ligaments of lumbar spine, initial encounter: Secondary | ICD-10-CM

## 2012-12-31 ENCOUNTER — Ambulatory Visit
Admission: RE | Admit: 2012-12-31 | Discharge: 2012-12-31 | Disposition: A | Discharge status: Home / Self Care | Payer: Medicare Other | Source: Ambulatory Visit | Attending: Internal Medicine | Admitting: Internal Medicine

## 2012-12-31 DIAGNOSIS — S335XXA Sprain of ligaments of lumbar spine, initial encounter: Secondary | ICD-10-CM

## 2013-02-03 ENCOUNTER — Ambulatory Visit (INDEPENDENT_AMBULATORY_CARE_PROVIDER_SITE_OTHER): Payer: Medicare Other | Admitting: Cardiovascular Disease

## 2013-02-03 ENCOUNTER — Ambulatory Visit (HOSPITAL_COMMUNITY): Payer: MEDICARE | Attending: Cardiology | Admitting: Radiology

## 2013-02-03 ENCOUNTER — Encounter: Payer: Self-pay | Admitting: Cardiovascular Disease

## 2013-02-03 VITALS — BP 112/68 | HR 71 | Ht 65.0 in | Wt 168.8 lb

## 2013-02-03 DIAGNOSIS — I4891 Unspecified atrial fibrillation: Secondary | ICD-10-CM | POA: Insufficient documentation

## 2013-02-03 DIAGNOSIS — E119 Type 2 diabetes mellitus without complications: Secondary | ICD-10-CM | POA: Insufficient documentation

## 2013-02-03 DIAGNOSIS — Q211 Atrial septal defect: Secondary | ICD-10-CM | POA: Insufficient documentation

## 2013-02-03 DIAGNOSIS — Q2111 Secundum atrial septal defect: Secondary | ICD-10-CM | POA: Insufficient documentation

## 2013-02-03 DIAGNOSIS — I10 Essential (primary) hypertension: Secondary | ICD-10-CM | POA: Insufficient documentation

## 2013-02-03 NOTE — Patient Instructions (Signed)
Your physician wants you to follow-up in: 1 YEAR you will receive a reminder letter in the mail two months in advance. If you don't receive a letter, please call our office to schedule the follow-up appointment.  Your physician recommends that you continue on your current medications as directed. Please refer to the Current Medication list given to you today.

## 2013-02-03 NOTE — Progress Notes (Signed)
Echocardiogram performed.  

## 2013-02-04 ENCOUNTER — Encounter: Payer: Self-pay | Admitting: Cardiovascular Disease

## 2013-02-04 NOTE — Progress Notes (Signed)
HPI:  Bethany Oconnor returns for followup evaluation. She is a 72 year old woman with paroxysmal atrial fibrillation. She has a small ostium secundum ASD without evidence of right heart dilatation. She's most limited by loss of vision. She denies cardiopulmonary symptoms and specifically denies palpitations, dyspnea, chest pain, or edema. She's been compliant with her medical program and is anticoagulated with Pradaxa.  Outpatient Encounter Prescriptions as of 02/03/2013  Medication Sig Dispense Refill  . beta carotene w/minerals (OCUVITE) tablet Take 1 tablet by mouth daily.        . Calcium Carbonate-Vit D-Min (CALTRATE 600+D PLUS) 600-400 MG-UNIT per tablet Take 1 tablet by mouth 2 (two) times daily.        Marland Kitchen CARTIA XT 180 MG 24 hr capsule TAKE ONE CAPSULE BY MOUTH DAILY  90 capsule  3  . conjugated estrogens-medroxyprogesteron (PREMPHASE) TABS Take 1 tablet by mouth daily.        . dabigatran (PRADAXA) 150 MG CAPS Take 1 capsule (150 mg total) by mouth every 12 (twelve) hours.  180 capsule  3  . hydrochlorothiazide (HYDRODIURIL) 25 MG tablet Take 25 mg by mouth daily.        Marland Kitchen lisinopril (PRINIVIL,ZESTRIL) 10 MG tablet Take 10 mg by mouth daily.        Marland Kitchen LORazepam (ATIVAN) 1 MG tablet Take 1 mg by mouth at bedtime.        . metoprolol (TOPROL-XL) 50 MG 24 hr tablet Take by mouth daily.       . pravastatin (PRAVACHOL) 20 MG tablet Take 20 mg by mouth daily.        Marland Kitchen VIIBRYD 20 MG TABS Take 1 tablet by mouth daily.      Marland Kitchen nystatin-triamcinolone (MYCOLOG II) cream As directed      . [DISCONTINUED] omega-3 acid ethyl esters (LOVAZA) 1 G capsule Take 1 g by mouth 4 (four) times daily.         No facility-administered encounter medications on file as of 02/03/2013.    Allergies  Allergen Reactions  . Codeine Other (See Comments)    About 50 years ago, took Codeine for scratched cornea while pregnant, and it made her extremely sedated.    Past Medical History  Diagnosis Date  . Paroxysmal  atrial fibrillation   . Cerebral aneurysm     s/p endovascular colling 2008  . Hearing loss   . Diabetes mellitus     borderline  . Hypertension     mild  . Macular degeneration   . Neuropathy     ROS: Negative except as per HPI  BP 112/68  Pulse 71  Ht 5\' 5"  (1.651 m)  Wt 76.567 kg (168 lb 12.8 oz)  BMI 28.09 kg/m2  PHYSICAL EXAM: Pt is alert and oriented, elderly woman in NAD HEENT: normal Neck: JVP - normal, carotids 2+= without bruits Lungs: CTA bilaterally CV: RRR without murmur or gallop Abd: soft, NT, Positive BS, no hepatomegaly Ext: no C/C/E, distal pulses intact and equal Skin: warm/dry no rash  EKG:  Normal sinus rhythm 71 beats per minute, rightward axis, otherwise within normal limits.  2-D echo: Study Conclusions  - Left ventricle: The cavity size was normal. Wall thickness was normal. Systolic function was normal. The estimated ejection fraction was in the range of 60% to 65%. Wall motion was normal; there were no regional wall motion abnormalities. Doppler parameters are consistent with abnormal left ventricular relaxation (grade 1 diastolic dysfunction). - Atrial septum: No defect or patent foramen  ovale was identified.  ASSESSMENT AND PLAN: 1. Paroxysmal atrial fibrillation. She remains in sinus rhythm. Will send a request for her most recent labs to verify creatinine clearance is within normal limits. Her CHADS-Vasc score is 3 (HTN, age, female gender) and continued anticoagulation is indicated per guideline recommendation.  2. Ostium secundum ASD. Echocardiogram today showed no evidence of significant ASD and normal right-sided function and cardiac chamber size was noted.  3. Essential hypertension. Blood pressure is well controlled.  I'll see her back in one year for follow-up.  Tonny Bollman 02/04/2013 6:02 AM

## 2013-02-10 ENCOUNTER — Other Ambulatory Visit: Payer: Self-pay

## 2013-07-12 ENCOUNTER — Other Ambulatory Visit: Payer: Self-pay

## 2013-07-12 DIAGNOSIS — Z1231 Encounter for screening mammogram for malignant neoplasm of breast: Secondary | ICD-10-CM

## 2013-08-10 ENCOUNTER — Ambulatory Visit (INDEPENDENT_AMBULATORY_CARE_PROVIDER_SITE_OTHER): Payer: Medicare Other | Admitting: Cardiovascular Disease

## 2013-08-10 ENCOUNTER — Encounter: Payer: Self-pay | Admitting: Cardiovascular Disease

## 2013-08-10 ENCOUNTER — Encounter (INDEPENDENT_AMBULATORY_CARE_PROVIDER_SITE_OTHER): Payer: Self-pay

## 2013-08-10 VITALS — BP 120/68 | HR 80 | Ht 65.0 in | Wt 172.0 lb

## 2013-08-10 DIAGNOSIS — I4891 Unspecified atrial fibrillation: Secondary | ICD-10-CM

## 2013-08-10 DIAGNOSIS — I10 Essential (primary) hypertension: Secondary | ICD-10-CM

## 2013-08-10 NOTE — Progress Notes (Signed)
HPI:   72 year old woman presenting for followup evaluation. The patient has paroxysmal atrial fibrillation and a small ostium secundum ASD. She has been managed medically. She's chronically anticoagulated with Pradaxa, and she has no history of bleeding problems.   From a cardiac perspective she seems to be doing fine. She denies chest pain or pressure, dyspnea, palpitations, edema, lightheadedness, or syncope. Her medications are unchanged. Her primary problem is vision loss from macular degeneration. Her vision is now extremely diminished.   She is tolerating anticoagulation with Pradaxa and denies bleeding complications.    Outpatient Encounter Prescriptions as of 08/10/2013  Medication Sig  . beta carotene w/minerals (OCUVITE) tablet Take 1 tablet by mouth daily.    . Calcium Carbonate-Vit D-Min (CALTRATE 600+D PLUS) 600-400 MG-UNIT per tablet Take 1 tablet by mouth 2 (two) times daily.    Marland Kitchen CARTIA XT 180 MG 24 hr capsule TAKE ONE CAPSULE BY MOUTH DAILY  . conjugated estrogens-medroxyprogesteron (PREMPHASE) TABS Take 1 tablet by mouth daily.    . dabigatran (PRADAXA) 150 MG CAPS Take 1 capsule (150 mg total) by mouth every 12 (twelve) hours.  . hydrochlorothiazide (HYDRODIURIL) 25 MG tablet Take 12.5mg  every Monday and Thursday  . lisinopril (PRINIVIL,ZESTRIL) 10 MG tablet Take 10 mg by mouth daily.    Marland Kitchen LORazepam (ATIVAN) 1 MG tablet Take 1 mg by mouth at bedtime.    . metoprolol (TOPROL-XL) 50 MG 24 hr tablet Take by mouth daily.   Marland Kitchen nystatin-triamcinolone (MYCOLOG II) cream As directed  . pravastatin (PRAVACHOL) 40 MG tablet Take 1 tablet (40 mg total) by mouth daily as needed.  Marland Kitchen VIIBRYD 20 MG TABS Take 1 tablet by mouth daily.    Allergies  Allergen Reactions  . Codeine Other (See Comments)    About 50 years ago, took Codeine for scratched cornea while pregnant, and it made her extremely sedated.    Past Medical History  Diagnosis Date  . Paroxysmal atrial fibrillation    . Cerebral aneurysm     s/p endovascular colling 2008  . Hearing loss   . Diabetes mellitus     borderline  . Hypertension     mild  . Macular degeneration   . Neuropathy     ROS: Negative except as per HPI  BP 120/68  Pulse 80  Ht 5\' 5"  (1.651 m)  Wt 172 lb (78.019 kg)  BMI 28.62 kg/m2  PHYSICAL EXAM: Pt is alert and oriented, NAD HEENT: normal Neck: JVP - normal, carotids 2+= without bruits Lungs: CTA bilaterally CV: RRR without murmur or gallop Abd: soft, NT, Positive BS, no hepatomegaly Ext: no C/C/E, distal pulses intact and equal Skin: warm/dry no rash  EKG:  Sinus rhythm 80 bpm, within normal limits  2-D echocardiogram 02/03/2013: Left ventricle: The cavity size was normal. Wall thickness was normal. Systolic function was normal. The estimated ejection fraction was in the range of 60% to 65%. Wall motion was normal; there were no regional wall motion abnormalities. Doppler parameters are consistent with abnormal left ventricular relaxation (grade 1 diastolic dysfunction).  ------------------------------------------------------------ Aortic valve: Trileaflet; mildly thickened leaflets. Cusp separation was normal. Doppler: Transvalvular velocity was within the normal range. There was no stenosis. No regurgitation.  ------------------------------------------------------------ Aorta: The aorta was normal, not dilated, and non-diseased.  ------------------------------------------------------------ Mitral valve: Structurally normal valve. Leaflet separation was normal. Doppler: Transvalvular velocity was within the normal range. There was no evidence for stenosis. No significant regurgitation.  ------------------------------------------------------------ Left atrium: The atrium was normal in size.  ------------------------------------------------------------  Atrial septum: Bubble study not done. No defect or patent foramen ovale was  identified.  ------------------------------------------------------------ Right ventricle: The cavity size was normal. Wall thickness was normal. Systolic function was normal.  ------------------------------------------------------------ Pulmonic valve: Structurally normal valve. Cusp separation was normal. Doppler: Transvalvular velocity was within the normal range. No regurgitation.  ------------------------------------------------------------ Tricuspid valve: Structurally normal valve. Leaflet separation was normal. Doppler: Transvalvular velocity was within the normal range. No regurgitation.  ------------------------------------------------------------ Right atrium: The atrium was normal in size.  ------------------------------------------------------------ Pericardium: The pericardium was normal in appearance.  ------------------------------------------------------------ Post procedure conclusions Ascending Aorta:  - The aorta was normal, not dilated, and non-diseased.   ASSESSMENT AND PLAN: 1. Paroxysmal atrial fibrillation. The patient remains stable in sinus rhythm. She is tolerating anticoagulation without bleeding complications.  2. Ostium secundum ASD. This is been previously documented, but was not seen on our surface echocardiogram from 02/03/2013. Of note, the patient's right-sided cardiac chambers were normal in size with normal function.  3. Hypertension. Blood pressure remains controlled on diltiazem, lisinopril, metoprolol, and hydrochlorothiazide.  Tonny Bollman 08/10/2013 9:08 PM

## 2013-08-10 NOTE — Patient Instructions (Signed)
Your physician wants you to follow-up in: 6 MONTHS with Dr Cooper.  You will receive a reminder letter in the mail two months in advance. If you don't receive a letter, please call our office to schedule the follow-up appointment.  Your physician recommends that you continue on your current medications as directed. Please refer to the Current Medication list given to you today.  

## 2013-08-19 ENCOUNTER — Other Ambulatory Visit: Payer: Self-pay

## 2013-08-19 MED ORDER — CALCIUM CARBONATE-VITAMIN D 600-400 MG-UNIT PO TABS: 1.0000 | ORAL_TABLET | Freq: Every day | ORAL | Status: DC

## 2013-08-19 MED ORDER — OCUVITE PO TABS: 1.0000 | ORAL_TABLET | Freq: Two times a day (BID) | ORAL | Status: DC

## 2013-08-25 ENCOUNTER — Ambulatory Visit
Admission: RE | Admit: 2013-08-25 | Discharge: 2013-08-25 | Disposition: A | Discharge status: Home / Self Care | Payer: MEDICARE | Source: Ambulatory Visit

## 2013-08-25 DIAGNOSIS — Z1231 Encounter for screening mammogram for malignant neoplasm of breast: Secondary | ICD-10-CM

## 2013-12-07 ENCOUNTER — Other Ambulatory Visit (HOSPITAL_COMMUNITY): Payer: Self-pay | Admitting: Radiology

## 2013-12-07 DIAGNOSIS — I671 Cerebral aneurysm, nonruptured: Secondary | ICD-10-CM

## 2013-12-27 ENCOUNTER — Ambulatory Visit (INDEPENDENT_AMBULATORY_CARE_PROVIDER_SITE_OTHER): Payer: BC Managed Care – PPO | Admitting: Surgery

## 2013-12-27 ENCOUNTER — Encounter (INDEPENDENT_AMBULATORY_CARE_PROVIDER_SITE_OTHER): Payer: Self-pay | Admitting: Surgery

## 2013-12-27 VITALS — BP 116/78 | HR 74 | Temp 97.9°F | Resp 14 | Ht 65.0 in | Wt 171.0 lb

## 2013-12-27 DIAGNOSIS — K649 Unspecified hemorrhoids: Secondary | ICD-10-CM

## 2013-12-27 DIAGNOSIS — K602 Anal fissure, unspecified: Secondary | ICD-10-CM

## 2013-12-27 NOTE — Patient Instructions (Signed)
High-Fiber Diet Fiber is found in fruits, vegetables, and grains. A high-fiber diet encourages the addition of more whole grains, legumes, fruits, and vegetables in your diet. The recommended amount of fiber for adult males is 38 g per day. For adult females, it is 25 g per day. Pregnant and lactating women should get 28 g of fiber per day. If you have a digestive or bowel problem, ask your caregiver for advice before adding high-fiber foods to your diet. Eat a variety of high-fiber foods instead of only a select few type of foods.  PURPOSE  To increase stool bulk.  To make bowel movements more regular to prevent constipation.  To lower cholesterol.  To prevent overeating. WHEN IS THIS DIET USED?  It may be used if you have constipation and hemorrhoids.  It may be used if you have uncomplicated diverticulosis (intestine condition) and irritable bowel syndrome.  It may be used if you need help with weight management.  It may be used if you want to add it to your diet as a protective measure against atherosclerosis, diabetes, and cancer. SOURCES OF FIBER  Whole-grain breads and cereals.  Fruits, such as apples, oranges, bananas, berries, prunes, and pears.  Vegetables, such as green peas, carrots, sweet potatoes, beets, broccoli, cabbage, spinach, and artichokes.  Legumes, such split peas, soy, lentils.  Almonds. FIBER CONTENT IN FOODS Starches and Grains / Dietary Fiber (g)  Cheerios, 1 cup / 3 g  Corn Flakes cereal, 1 cup / 0.7 g  Rice crispy treat cereal, 1 cup / 0.3 g  Instant oatmeal (cooked),  cup / 2 g  Frosted wheat cereal, 1 cup / 5.1 g  Brown, long-grain rice (cooked), 1 cup / 3.5 g  White, long-grain rice (cooked), 1 cup / 0.6 g  Enriched macaroni (cooked), 1 cup / 2.5 g Legumes / Dietary Fiber (g)  Baked beans (canned, plain, or vegetarian),  cup / 5.2 g  Kidney beans (canned),  cup / 6.8 g  Pinto beans (cooked),  cup / 5.5 g Breads and Crackers  / Dietary Fiber (g)  Plain or honey graham crackers, 2 squares / 0.7 g  Saltine crackers, 3 squares / 0.3 g  Plain, salted pretzels, 10 pieces / 1.8 g  Whole-wheat bread, 1 slice / 1.9 g  White bread, 1 slice / 0.7 g  Raisin bread, 1 slice / 1.2 g  Plain bagel, 3 oz / 2 g  Flour tortilla, 1 oz / 0.9 g  Corn tortilla, 1 small / 1.5 g  Hamburger or hotdog bun, 1 small / 0.9 g Fruits / Dietary Fiber (g)  Apple with skin, 1 medium / 4.4 g  Sweetened applesauce,  cup / 1.5 g  Banana,  medium / 1.5 g  Grapes, 10 grapes / 0.4 g  Orange, 1 small / 2.3 g  Raisin, 1.5 oz / 1.6 g  Melon, 1 cup / 1.4 g Vegetables / Dietary Fiber (g)  Green beans (canned),  cup / 1.3 g  Carrots (cooked),  cup / 2.3 g  Broccoli (cooked),  cup / 2.8 g  Peas (cooked),  cup / 4.4 g  Mashed potatoes,  cup / 1.6 g  Lettuce, 1 cup / 0.5 g  Corn (canned),  cup / 1.6 g  Tomato,  cup / 1.1 g Document Released: 08/19/2005 Document Revised: 02/18/2012 Document Reviewed: 11/21/2011 Exit[employer] Patient Information 2014 [employer]. Hemorrhoids Hemorrhoids are swollen veins around the rectum or anus. There are two types of hemorrhoids:     Internal hemorrhoids. These occur in the veins just inside the rectum. They may poke through to the outside and become irritated and painful.  External hemorrhoids. These occur in the veins outside the anus and can be felt as a painful swelling or hard lump near the anus. CAUSES  Pregnancy.   Obesity.   Constipation or diarrhea.   Straining to have a bowel movement.   Sitting for long periods on the toilet.  Heavy lifting or other activity that caused you to strain.  Anal intercourse. SYMPTOMS   Pain.   Anal itching or irritation.   Rectal bleeding.   Fecal leakage.   Anal swelling.   One or more lumps around the anus.  DIAGNOSIS  Your caregiver may be able to diagnose hemorrhoids by visual examination. Other  examinations or tests that may be performed include:   Examination of the rectal area with a gloved hand (digital rectal exam).   Examination of anal canal using a small tube (scope).   A blood test if you have lost a significant amount of blood.  A test to look inside the colon (sigmoidoscopy or colonoscopy). TREATMENT Most hemorrhoids can be treated at home. However, if symptoms do not seem to be getting better or if you have a lot of rectal bleeding, your caregiver may perform a procedure to help make the hemorrhoids get smaller or remove them completely. Possible treatments include:   Placing a rubber band at the base of the hemorrhoid to cut off the circulation (rubber band ligation).   Injecting a chemical to shrink the hemorrhoid (sclerotherapy).   Using a tool to burn the hemorrhoid (infrared light therapy).   Surgically removing the hemorrhoid (hemorrhoidectomy).   Stapling the hemorrhoid to block blood flow to the tissue (hemorrhoid stapling).  HOME CARE INSTRUCTIONS   Eat foods with fiber, such as whole grains, beans, nuts, fruits, and vegetables. Ask your doctor about taking products with added fiber in them (fibersupplements).  Increase fluid intake. Drink enough water and fluids to keep your urine clear or pale yellow.   Exercise regularly.   Go to the bathroom when you have the urge to have a bowel movement. Do not wait.   Avoid straining to have bowel movements.   Keep the anal area dry and clean. Use wet toilet paper or moist towelettes after a bowel movement.   Medicated creams and suppositories may be used or applied as directed.   Only take over-the-counter or prescription medicines as directed by your caregiver.   Take warm sitz baths for 15 20 minutes, 3 4 times a day to ease pain and discomfort.   Place ice packs on the hemorrhoids if they are tender and swollen. Using ice packs between sitz baths may be helpful.   Put ice in a  plastic bag.   Place a towel between your skin and the bag.   Leave the ice on for 15 20 minutes, 3 4 times a day.   Do not use a donut-shaped pillow or sit on the toilet for long periods. This increases blood pooling and pain.  SEEK MEDICAL CARE IF:  You have increasing pain and swelling that is not controlled by treatment or medicine.  You have uncontrolled bleeding.  You have difficulty or you are unable to have a bowel movement.  You have pain or inflammation outside the area of the hemorrhoids. MAKE SURE YOU:  Understand these instructions.  Will watch your condition.  Will get help right away if you   are not doing well or get worse. Document Released: 08/16/2000 Document Revised: 08/05/2012 Document Reviewed: 06/23/2012 [employer] Patient Information 2014 [employer].  

## 2013-12-27 NOTE — Progress Notes (Signed)
Patient ID: Bethany Oconnor, female   DOB: October 09, 1940, 73 y.o.   MRN: [id number]  No chief complaint on file.   HPI Bethany Oconnor is a 73 y.o. female.  Patient sent at the request of Dr. Ulanda Edison for hemorrhoid disease. She's been in [place] most of the winter and has had complaints of mild discomfort in her rectum, bleeding, and seepage. She was placed on diltiazem. Her symptoms are not severe but do bother her. She is a hemorrhoid since pregnancy many many years ago. HPI  Past Medical History  Diagnosis Date  . Paroxysmal atrial fibrillation   . Cerebral aneurysm     s/p endovascular colling 2008  . Hearing loss   . Diabetes mellitus     borderline  . Hypertension     mild  . Macular degeneration   . Neuropathy     Past Surgical History  Procedure Laterality Date  . Thyroidectomy    . Cervical cone biopsy    . Cataract extraction      bilateral    Family History  Problem Relation Age of Onset  . Cerebral aneurysm Mother 102    died  . Cancer Father 64    died    Social History History  Substance Use Topics  . Smoking status: Former Smoker    Quit date: 09/02/1988  . Smokeless tobacco: Not on file  . Alcohol Use: No    Allergies  Allergen Reactions  . Codeine Other (See Comments)    About 50 years ago, took Codeine for scratched cornea while pregnant, and it made her extremely sedated.    Current Outpatient Prescriptions  Medication Sig Dispense Refill  . beta carotene w/minerals (OCUVITE) tablet Take 1 tablet by mouth 2 (two) times daily.      . Calcium Carbonate-Vitamin D 600-400 MG-UNIT per tablet Take 1 tablet by mouth daily.      Marland Kitchen CARTIA XT 180 MG 24 hr capsule TAKE ONE CAPSULE BY MOUTH DAILY  90 capsule  3  . conjugated estrogens-medroxyprogesteron (PREMPHASE) TABS Take 1 tablet by mouth daily.        . dabigatran (PRADAXA) 150 MG CAPS Take 1 capsule (150 mg total) by mouth every 12 (twelve) hours.  180 capsule  3  . hydrochlorothiazide  (HYDRODIURIL) 25 MG tablet Take 12.5mg  every Monday and Thursday      . lisinopril (PRINIVIL,ZESTRIL) 10 MG tablet Take 10 mg by mouth daily.        Marland Kitchen LORazepam (ATIVAN) 1 MG tablet Take 1 mg by mouth at bedtime.        . metoprolol (TOPROL-XL) 50 MG 24 hr tablet Take by mouth daily.       Marland Kitchen nystatin-triamcinolone (MYCOLOG II) cream As directed      . pravastatin (PRAVACHOL) 40 MG tablet Take 1 tablet (40 mg total) by mouth daily as needed.      Marland Kitchen VIIBRYD 20 MG TABS Take 1 tablet by mouth daily.       No current facility-administered medications for this visit.    Review of Systems Review of Systems  Constitutional: Negative for fever, chills and unexpected weight change.  HENT: Negative for congestion, hearing loss, sore throat, trouble swallowing and voice change.   Eyes: Negative for visual disturbance.  Respiratory: Negative for cough and wheezing.   Cardiovascular: Negative for chest pain, palpitations and leg swelling.  Gastrointestinal: Positive for anal bleeding and rectal pain. Negative for nausea, vomiting, abdominal pain, diarrhea, constipation, blood in stool  and abdominal distention.  Genitourinary: Negative for frequency, hematuria, vaginal bleeding and difficulty urinating.  Musculoskeletal: Negative for arthralgias.  Skin: Negative for rash and wound.  Neurological: Negative for seizures, syncope and headaches.  Hematological: Negative for adenopathy. Does not bruise/bleed easily.  Psychiatric/Behavioral: Negative for confusion.    Blood pressure 116/78, pulse 74, temperature 97.9 F (36.6 C), resp. rate 14, height 5\' 5"  (1.651 m), weight 171 lb (77.565 kg).  Physical Exam Physical Exam  Constitutional: She is oriented to person, place, and time. She appears well-developed and well-nourished.  HENT:  Head: Normocephalic and atraumatic.  Eyes: Pupils are equal, round, and reactive to light. No scleral icterus.  Neck: Normal range of motion. Neck supple.    Genitourinary:     Musculoskeletal: Normal range of motion.  Neurological: She is alert and oriented to person, place, and time.  Skin: Skin is warm and dry.  Psychiatric: She has a normal mood and affect. Her behavior is normal. Judgment and thought content normal.    Data Reviewed none  Assessment    Anal fissure  Grade 2 - 3  Internal hemorhoids 2 columns    Plan    Symptoms are minimal and she is on Pradaxa. Recommend attempt at medical management. Discussed office-based procedures and hemorrhoidectomy. She is on Pradaxa and this will need to be held prior to the operative intervention. Recommend high fiber diet, Proctofoam, exercise daily, increase water intake and decrease alcohol intake. Return 4 weeks for recheck and potential sclerotherapy or banding. She's been on diltiazem it looks like of asked her to stop that for right now. The fissure appears asymptomatic currently. She does have the decreased rectal tone and  surgery may increase her risk of fecal incontinence.       Jenica Costilow A. Rashidi Loh 12/27/2013, 10:53 AM

## 2013-12-28 ENCOUNTER — Telehealth (INDEPENDENT_AMBULATORY_CARE_PROVIDER_SITE_OTHER): Payer: Self-pay

## 2013-12-28 ENCOUNTER — Other Ambulatory Visit (INDEPENDENT_AMBULATORY_CARE_PROVIDER_SITE_OTHER): Payer: Self-pay

## 2013-12-28 DIAGNOSIS — K649 Unspecified hemorrhoids: Secondary | ICD-10-CM

## 2013-12-28 MED ORDER — PRAMOXINE HCL 1 % RE FOAM: 1.0000 "application " | Freq: Three times a day (TID) | RECTAL | Status: DC | PRN

## 2013-12-28 NOTE — Telephone Encounter (Signed)
Script sent and pt aware

## 2013-12-28 NOTE — Telephone Encounter (Signed)
Message copied by Carlene Coria on Tue Dec 28, 2013  1:25 PM ------      Message from: Joya San      Created: Tue Dec 28, 2013  9:47 AM      Contact: [phone]       Pt called his Pharmacy to pick up a prescription for his wife which is the pt . Some type of medication for her Hems. It was suppose to be called in at the [facility] on [address]. Please advise  ------

## 2014-01-05 ENCOUNTER — Other Ambulatory Visit: Payer: Self-pay | Admitting: Cardiovascular Disease

## 2014-01-10 ENCOUNTER — Other Ambulatory Visit (INDEPENDENT_AMBULATORY_CARE_PROVIDER_SITE_OTHER): Payer: Self-pay | Admitting: Surgery

## 2014-01-13 ENCOUNTER — Other Ambulatory Visit (INDEPENDENT_AMBULATORY_CARE_PROVIDER_SITE_OTHER): Payer: Self-pay

## 2014-01-13 ENCOUNTER — Telehealth (INDEPENDENT_AMBULATORY_CARE_PROVIDER_SITE_OTHER): Payer: Self-pay

## 2014-01-13 DIAGNOSIS — K649 Unspecified hemorrhoids: Secondary | ICD-10-CM

## 2014-01-13 MED ORDER — PRAMOXINE HCL 1 % RE FOAM: 1.0000 "application " | Freq: Three times a day (TID) | RECTAL | Status: DC | PRN

## 2014-01-13 NOTE — Telephone Encounter (Signed)
Called pt to let her know that we had sent the Rx for Proctofoam to her pharmacy. Pt's husband verbalized understanding.

## 2014-01-13 NOTE — Telephone Encounter (Signed)
sure

## 2014-01-13 NOTE — Telephone Encounter (Signed)
Pt would like to get a refill on her Proctofoam.

## 2014-01-18 ENCOUNTER — Other Ambulatory Visit: Payer: Medicare Other

## 2014-01-18 ENCOUNTER — Ambulatory Visit (HOSPITAL_COMMUNITY)
Admission: RE | Admit: 2014-01-18 | Discharge: 2014-01-18 | Disposition: A | Discharge status: Home / Self Care | Payer: MEDICARE | Source: Ambulatory Visit | Attending: Radiology | Admitting: Radiology

## 2014-01-18 ENCOUNTER — Ambulatory Visit
Admission: RE | Admit: 2014-01-18 | Discharge: 2014-01-18 | Disposition: A | Discharge status: Home / Self Care | Payer: MEDICARE | Source: Ambulatory Visit | Attending: Radiology | Admitting: Radiology

## 2014-01-18 DIAGNOSIS — I671 Cerebral aneurysm, nonruptured: Secondary | ICD-10-CM | POA: Insufficient documentation

## 2014-01-18 NOTE — Progress Notes (Signed)
Patient ID: Bethany Oconnor, female   DOB: [birth year], 73 y.o.   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 Her MRA angiogram from 2013 and 2011 demonstrate a 1.5 mm residual aneurysm at the neck of the previously coiled aneurysm. This neck remnant is stable on today's exam.  Patient denies symptoms related to the aneurysm. She does have some progressive loss of vision due to macular degeneration. She is the common more hard of hearing. She denies any headaches.  PHYSICAL EXAMINATION: Cranial nerves II-XII are intact.  ASSESSMENT AND PLAN: Stable 1.5 mm neck remnant associated with a coiled anterior communicating artery aneurysm. This residual or recurrent is remnant has been stable for 4 years and can be considered stable. No routine follow-up is indicated at this time.  I advise the patient that if she began to have any difficulty with diplopia or headaches to return for further evaluation. The patient has contact information for [facility].  I answered all questions from the patient and her husband.

## 2014-01-25 ENCOUNTER — Ambulatory Visit (INDEPENDENT_AMBULATORY_CARE_PROVIDER_SITE_OTHER): Payer: BC Managed Care – PPO | Admitting: Surgery

## 2014-01-25 ENCOUNTER — Encounter (INDEPENDENT_AMBULATORY_CARE_PROVIDER_SITE_OTHER): Payer: Self-pay | Admitting: Surgery

## 2014-01-25 VITALS — BP 124/82 | HR 76 | Temp 97.7°F | Resp 18 | Ht 65.0 in | Wt 171.2 lb

## 2014-01-25 DIAGNOSIS — K649 Unspecified hemorrhoids: Secondary | ICD-10-CM

## 2014-01-25 DIAGNOSIS — K602 Anal fissure, unspecified: Secondary | ICD-10-CM

## 2014-01-25 NOTE — Patient Instructions (Signed)
Continue high fiber diet.  Return if pain,  Discharge  Or other rectal problems.

## 2014-01-25 NOTE — Progress Notes (Signed)
Patient ID: Bethany Oconnor, female   DOB: 05-01-1941, 73 y.o.   MRN: [id number]  Chief Complaint  Patient presents with  . Routine Post Op    recheck hems    HPI Bethany Oconnor is a 73 y.o. female.  Patient sent at the request of Dr. Ulanda Edison for hemorrhoid disease. She's been in [place] most of the winter and has had complaints of mild discomfort in her rectum, bleeding, and seepage. She was placed on diltiazem. Her symptoms are not severe but do bother her. She is a hemorrhoid since pregnancy many many years ago. She returns and is doing about the same.  No significant bleeding or discharge.  HPI  Past Medical History  Diagnosis Date  . Paroxysmal atrial fibrillation   . Cerebral aneurysm     s/p endovascular colling 2008  . Hearing loss   . Diabetes mellitus     borderline  . Hypertension     mild  . Macular degeneration   . Neuropathy     Past Surgical History  Procedure Laterality Date  . Thyroidectomy    . Cervical cone biopsy    . Cataract extraction      bilateral    Family History  Problem Relation Age of Onset  . Cerebral aneurysm Mother 18    died  . Cancer Father 75    died    Social History History  Substance Use Topics  . Smoking status: Former Smoker    Quit date: 09/02/1988  . Smokeless tobacco: Not on file  . Alcohol Use: No    Allergies  Allergen Reactions  . Codeine Other (See Comments)    About 50 years ago, took Codeine for scratched cornea while pregnant, and it made her extremely sedated.    Current Outpatient Prescriptions  Medication Sig Dispense Refill  . beta carotene w/minerals (OCUVITE) tablet Take 1 tablet by mouth 2 (two) times daily.      . Calcium Carbonate-Vitamin D 600-400 MG-UNIT per tablet Take 1 tablet by mouth daily.      Marland Kitchen CARTIA XT 180 MG 24 hr capsule TAKE ONE CAPSULE BY MOUTH EVERY DAY  90 capsule  0  . conjugated estrogens-medroxyprogesteron (PREMPHASE) TABS Take 1 tablet by mouth daily.        . dabigatran  (PRADAXA) 150 MG CAPS Take 1 capsule (150 mg total) by mouth every 12 (twelve) hours.  180 capsule  3  . hydrochlorothiazide (HYDRODIURIL) 25 MG tablet Take 12.5mg  every Monday and Thursday      . lisinopril (PRINIVIL,ZESTRIL) 10 MG tablet Take 10 mg by mouth daily.        Marland Kitchen LORazepam (ATIVAN) 1 MG tablet Take 1 mg by mouth at bedtime.        . metoprolol (TOPROL-XL) 50 MG 24 hr tablet Take by mouth daily.       Marland Kitchen nystatin-triamcinolone (MYCOLOG II) cream As directed      . pramoxine (PROCTOFOAM) 1 % foam Place 1 application rectally 3 (three) times daily as needed for itching.  15 g  0  . pravastatin (PRAVACHOL) 40 MG tablet Take 1 tablet (40 mg total) by mouth daily as needed.      Marland Kitchen VIIBRYD 20 MG TABS Take 1 tablet by mouth daily.       No current facility-administered medications for this visit.    Review of Systems Review of Systems  Constitutional: Negative for fever, chills and unexpected weight change.  HENT: Negative for congestion, hearing loss,  sore throat, trouble swallowing and voice change.   Eyes: Negative for visual disturbance.  Respiratory: Negative for cough and wheezing.   Cardiovascular: Negative for chest pain, palpitations and leg swelling.  Gastrointestinal: Positive for anal bleeding and rectal pain. Negative for nausea, vomiting, abdominal pain, diarrhea, constipation, blood in stool and abdominal distention.  Genitourinary: Negative for frequency, hematuria, vaginal bleeding and difficulty urinating.  Musculoskeletal: Negative for arthralgias.  Skin: Negative for rash and wound.  Neurological: Negative for seizures, syncope and headaches.  Hematological: Negative for adenopathy. Does not bruise/bleed easily.  Psychiatric/Behavioral: Negative for confusion.    Blood pressure 124/82, pulse 76, temperature 97.7 F (36.5 C), temperature source Oral, resp. rate 18, height 5\' 5"  (1.651 m), weight 171 lb 3.2 oz (77.656 kg).  Physical Exam Physical Exam    Constitutional: She is oriented to person, place, and time. She appears well-developed and well-nourished.  HENT:  Head: Normocephalic and atraumatic.  Eyes: Pupils are equal, round, and reactive to light. No scleral icterus.  Neck: Normal range of motion. Neck supple.  Genitourinary:    fissure is slightly smaller.  Soft no mass in anal canal or rectum.  Musculoskeletal: Normal range of motion.  Neurological: She is alert and oriented to person, place, and time.  Skin: Skin is warm and dry.  Psychiatric: She has a normal mood and affect. Her behavior is normal. Judgment and thought content normal.    Data Reviewed none  Assessment    Anal fissure  Grade 2 - 3  Internal hemorhoids 2 columns Stable overall    Plan    She feels fine and is having no significant pain,  Discharge or bleeding.  No indication for surgical intervention. Fissure is healing slowly.  Continue high fiber diet and avoid straining.   Return as needed or if symptoms return.        Cortne Amara A. Shaquia Berkley 01/25/2014, 10:19 AM

## 2014-02-07 ENCOUNTER — Ambulatory Visit: Payer: Medicare Other | Admitting: Nurse Practitioner

## 2014-03-01 ENCOUNTER — Encounter: Payer: Self-pay | Admitting: Cardiovascular Disease

## 2014-03-01 ENCOUNTER — Ambulatory Visit (INDEPENDENT_AMBULATORY_CARE_PROVIDER_SITE_OTHER): Payer: MEDICARE | Admitting: Cardiovascular Disease

## 2014-03-01 VITALS — BP 101/63 | HR 72 | Ht 65.0 in | Wt 171.0 lb

## 2014-03-01 DIAGNOSIS — I10 Essential (primary) hypertension: Secondary | ICD-10-CM

## 2014-03-01 DIAGNOSIS — I4891 Unspecified atrial fibrillation: Secondary | ICD-10-CM

## 2014-03-01 NOTE — Patient Instructions (Signed)
Your physician wants you to follow-up in: 6 MONTHS with Dr Cooper.  You will receive a reminder letter in the mail two months in advance. If you don't receive a letter, please call our office to schedule the follow-up appointment.  Your physician recommends that you continue on your current medications as directed. Please refer to the Current Medication list given to you today.  

## 2014-03-02 ENCOUNTER — Encounter: Payer: Self-pay | Admitting: Cardiovascular Disease

## 2014-03-02 NOTE — Progress Notes (Signed)
    HPI:  73 year old woman presenting for followup evaluation. The patient has paroxysmal atrial fibrillation and a small ostium secundum ASD. She has been managed medically. She's chronically anticoagulated with Pradaxa, and she has no history of bleeding problems. Her most recent echocardiogram one year ago demonstrated normal LV systolic function. There was no clear evidence of color-flow across the interatrial septum. There was no right ventricular enlargement.  From a cardiac perspective, the patient continues to do well. She reports no chest pain, dyspnea, heart palpitations, lightheadedness, or near syncope. She continues to struggle with vision loss related to macular degeneration.   Outpatient Encounter Prescriptions as of 03/01/2014  Medication Sig  . beta carotene w/minerals (OCUVITE) tablet Take 1 tablet by mouth 2 (two) times daily.  . Calcium Carbonate-Vitamin D 600-400 MG-UNIT per tablet Take 1 tablet by mouth daily.  Marland Kitchen CARTIA XT 180 MG 24 hr capsule TAKE ONE CAPSULE BY MOUTH EVERY DAY  . conjugated estrogens-medroxyprogesteron (PREMPHASE) TABS Take 1 tablet by mouth daily.    . dabigatran (PRADAXA) 150 MG CAPS Take 1 capsule (150 mg total) by mouth every 12 (twelve) hours.  . hydrochlorothiazide (HYDRODIURIL) 25 MG tablet Take 12.5mg  every Monday and Thursday  . lisinopril (PRINIVIL,ZESTRIL) 10 MG tablet Take 10 mg by mouth daily.    Marland Kitchen LORazepam (ATIVAN) 1 MG tablet Take 1 mg by mouth at bedtime.    . metoprolol (TOPROL-XL) 50 MG 24 hr tablet Take by mouth daily.   Marland Kitchen nystatin-triamcinolone (MYCOLOG II) cream As directed  . pramoxine (PROCTOFOAM) 1 % foam Place 1 application rectally 3 (three) times daily as needed for itching.  . pravastatin (PRAVACHOL) 40 MG tablet Take 1 tablet (40 mg total) by mouth daily as needed.  Marland Kitchen VIIBRYD 20 MG TABS Take 1 tablet by mouth daily.    Allergies  Allergen Reactions  . Codeine Other (See Comments)    About 50 years ago, took Codeine for  scratched cornea while pregnant, and it made her extremely sedated.    Past Medical History  Diagnosis Date  . Paroxysmal atrial fibrillation   . Cerebral aneurysm     s/p endovascular colling 2008  . Hearing loss   . Diabetes mellitus     borderline  . Hypertension     mild  . Macular degeneration   . Neuropathy    BP 101/63  Pulse 72  Ht 5\' 5"  (1.651 m)  Wt 77.565 kg (171 lb)  BMI 28.46 kg/m2  PHYSICAL EXAM: Pt is alert and oriented, NAD HEENT: normal Neck: JVP - normal, carotids 2+= without bruits Lungs: CTA bilaterally CV: RRR without murmur or gallop Abd: soft, NT, Positive BS, no hepatomegaly Ext: no C/C/E, distal pulses intact and equal Skin: warm/dry no rash  EKG:  Normal sinus rhythm 73 beats per minute, rightward axis, otherwise within normal limits.  ASSESSMENT AND PLAN: 1. Paroxysmal atrial fibrillation. The patient remains stable in sinus rhythm. She is tolerating anticoagulation without bleeding complications.    2. Ostium secundum ASD, small. No evidence of RV enlargement on echocardiography. Continue observation.  3. Hypertension. Blood pressure well controlled on her current medical program.   Sherren Mocha 03/02/2014 5:38 AM

## 2014-04-11 ENCOUNTER — Other Ambulatory Visit: Payer: Self-pay | Admitting: Cardiovascular Disease

## 2014-07-22 ENCOUNTER — Encounter: Payer: Self-pay | Admitting: Cardiovascular Disease

## 2014-07-27 ENCOUNTER — Telehealth: Payer: Self-pay | Admitting: Hematology

## 2014-07-27 NOTE — Telephone Encounter (Signed)
S/W PATIENT AND GAVE NP APPT FOR 12/15 @ 1:30 W/DR. SHADAD.  [facility] PACKET MAILED Garrel Ridgel

## 2014-08-04 ENCOUNTER — Ambulatory Visit (INDEPENDENT_AMBULATORY_CARE_PROVIDER_SITE_OTHER): Payer: MEDICARE | Admitting: Cardiovascular Disease

## 2014-08-04 ENCOUNTER — Encounter: Payer: Self-pay | Admitting: Cardiovascular Disease

## 2014-08-04 VITALS — BP 130/78 | HR 74 | Ht 65.0 in | Wt 176.8 lb

## 2014-08-04 DIAGNOSIS — Q211 Atrial septal defect: Secondary | ICD-10-CM

## 2014-08-04 DIAGNOSIS — Q2111 Secundum atrial septal defect: Secondary | ICD-10-CM

## 2014-08-04 DIAGNOSIS — I10 Essential (primary) hypertension: Secondary | ICD-10-CM

## 2014-08-04 DIAGNOSIS — I4891 Unspecified atrial fibrillation: Secondary | ICD-10-CM

## 2014-08-04 NOTE — Progress Notes (Signed)
Background: The patient has been followed for paroxysmal atrial fibrillation and a small ostium secundum ASD. She has been managed medically. She's chronically anticoagulated with Pradaxa, and she has no history of bleeding problems. Her most recent echocardiogram in 2014 demonstrated normal LV systolic function. There was no clear evidence of color-flow across the interatrial septum. There was no right ventricular enlargement.  HPI:   73 year old woman presenting for follow-up evaluation.  She has no cardiac related complaints today. She continues to struggle with vision loss, understanding that her vision will not improve.  She denies chest pain, chest pressure , edema, or shortness of breath. She does admit to heart palpitations occurring at rest. These episodes have been self-limited without associated symptoms. They occur most commonly when she is lying down.  Studies:   2-D echocardiogram 02/03/2013: Study Conclusions  - Left ventricle: The cavity size was normal. Wall thickness was normal. Systolic function was normal. The estimated ejection fraction was in the range of 60% to 65%. Wall motion was normal; there were no regional wall motion abnormalities. Doppler parameters are consistent with abnormal left ventricular relaxation (grade 1 diastolic dysfunction). - Atrial septum: No defect or patent foramen ovale was identified.  Outpatient Encounter Prescriptions as of 08/04/2014  Medication Sig  . beta carotene w/minerals (OCUVITE) tablet Take 1 tablet by mouth 2 (two) times daily.  . Calcium Carbonate-Vitamin D 600-400 MG-UNIT per tablet Take 1 tablet by mouth daily.  Marland Kitchen CARTIA XT 180 MG 24 hr capsule TAKE 1 CAPSULE DAILY. NEEDS OFFICE VISIT.  Marland Kitchen conjugated estrogens-medroxyprogesteron (PREMPHASE) TABS Take 1 tablet by mouth daily.    . dabigatran (PRADAXA) 150 MG CAPS Take 1 capsule (150 mg total) by mouth every 12 (twelve) hours.  . hydrochlorothiazide (HYDRODIURIL)  25 MG tablet Take 12.5mg  every Monday and Thursday  . lisinopril (PRINIVIL,ZESTRIL) 10 MG tablet Take 10 mg by mouth daily.    Marland Kitchen LORazepam (ATIVAN) 1 MG tablet Take 1 mg by mouth at bedtime.    . metoprolol (TOPROL-XL) 50 MG 24 hr tablet Take by mouth daily.   . pravastatin (PRAVACHOL) 40 MG tablet Take 1 tablet (40 mg total) by mouth daily as needed.  Marland Kitchen VIIBRYD 20 MG TABS Take 1 tablet by mouth daily.  . [DISCONTINUED] nystatin-triamcinolone (MYCOLOG II) cream As directed  . [DISCONTINUED] pramoxine (PROCTOFOAM) 1 % foam Place 1 application rectally 3 (three) times daily as needed for itching. (Patient not taking: Reported on 08/04/2014)  . [DISCONTINUED] PREMPRO 0.625-5 MG per tablet     Allergies  Allergen Reactions  . Codeine Other (See Comments)    About 50 years ago, took Codeine for scratched cornea while pregnant, and it made her extremely sedated.    Past Medical History  Diagnosis Date  . Paroxysmal atrial fibrillation   . Cerebral aneurysm     s/p endovascular colling 2008  . Hearing loss   . Diabetes mellitus     borderline  . Hypertension     mild  . Macular degeneration   . Neuropathy     family history includes Cancer (age of onset: 67) in her father; Cerebral aneurysm (age of onset: 82) in her mother.   ROS: Negative except as per HPI  BP 130/78 mmHg  Pulse 74  Ht 5\' 5"  (1.651 m)  Wt 176 lb 12.8 oz (80.196 kg)  BMI 29.42 kg/m2  PHYSICAL EXAM: Pt is alert and oriented, NAD Neck: JVP - normal, carotids 2+= without bruits Lungs: CTA bilaterally CV: RRR without  murmur or gallop Abd: soft, NT, Positive BS, no hepatomegaly Ext: no C/C/E, distal pulses intact and equal Skin: warm/dry no rash  EKG:   Normal sinus rhythm 74 bpm, within normal limits.  ASSESSMENT AND PLAN: 1. Paroxysmal atrial fibrillation. The patient remains stable in sinus rhythm. She is tolerating anticoagulation without bleeding complications.   2. Ostium secundum ASD, small. No  evidence of RV enlargement or shunt by color-flow Doppler on echocardiography. Continue observation.  3. Hypertension. Blood pressure well controlled on her current medical program.  Sherren Mocha, MD 08/04/2014 11:57 AM

## 2014-08-04 NOTE — Patient Instructions (Signed)
Your physician wants you to follow-up in: 6 MONTHS with Dr Cooper.  You will receive a reminder letter in the mail two months in advance. If you don't receive a letter, please call our office to schedule the follow-up appointment.  Your physician recommends that you continue on your current medications as directed. Please refer to the Current Medication list given to you today.  

## 2014-08-16 ENCOUNTER — Telehealth: Payer: Self-pay | Admitting: Hematology

## 2014-08-16 ENCOUNTER — Other Ambulatory Visit (HOSPITAL_COMMUNITY)
Admission: RE | Admit: 2014-08-16 | Discharge: 2014-08-16 | Disposition: A | Discharge status: Home / Self Care | Payer: MEDICARE | Source: Ambulatory Visit | Attending: Hematology | Admitting: Hematology

## 2014-08-16 ENCOUNTER — Ambulatory Visit (HOSPITAL_BASED_OUTPATIENT_CLINIC_OR_DEPARTMENT_OTHER): Payer: MEDICARE | Admitting: Hematology

## 2014-08-16 ENCOUNTER — Other Ambulatory Visit (HOSPITAL_BASED_OUTPATIENT_CLINIC_OR_DEPARTMENT_OTHER): Payer: MEDICARE

## 2014-08-16 ENCOUNTER — Encounter: Payer: Self-pay | Admitting: Hematology

## 2014-08-16 ENCOUNTER — Ambulatory Visit (HOSPITAL_BASED_OUTPATIENT_CLINIC_OR_DEPARTMENT_OTHER): Payer: MEDICARE

## 2014-08-16 VITALS — BP 124/61 | HR 81 | Temp 98.2°F | Resp 19 | Ht 65.0 in | Wt 173.7 lb

## 2014-08-16 DIAGNOSIS — D72820 Lymphocytosis (symptomatic): Secondary | ICD-10-CM | POA: Insufficient documentation

## 2014-08-16 DIAGNOSIS — I10 Essential (primary) hypertension: Secondary | ICD-10-CM

## 2014-08-16 LAB — COMPREHENSIVE METABOLIC PANEL (CC13)
ALBUMIN: 3.8 g/dL (ref 3.5–5.0)
ALT: 25 U/L (ref 0–55)
AST: 25 U/L (ref 5–34)
Alkaline Phosphatase: 45 U/L (ref 40–150)
Anion Gap: 10 mEq/L (ref 3–11)
BUN: 12.4 mg/dL (ref 7.0–26.0)
CALCIUM: 9.4 mg/dL (ref 8.4–10.4)
CO2: 22 meq/L (ref 22–29)
Chloride: 108 mEq/L (ref 98–109)
Creatinine: 0.9 mg/dL (ref 0.6–1.1)
EGFR: 61 mL/min/{1.73_m2} — ABNORMAL LOW (ref >90)
GLUCOSE: 104 mg/dL (ref 70–140)
POTASSIUM: 4 meq/L (ref 3.5–5.1)
SODIUM: 140 meq/L (ref 136–145)
TOTAL PROTEIN: 6.5 g/dL (ref 6.4–8.3)
Total Bilirubin: 0.43 mg/dL (ref 0.20–1.20)

## 2014-08-16 LAB — CBC & DIFF AND RETIC
BASO%: 0.2 % (ref 0.0–2.0)
BASOS ABS: 0 10*3/uL (ref 0.0–0.1)
EOS%: 0.8 % (ref 0.0–7.0)
Eosinophils Absolute: 0.1 10*3/uL (ref 0.0–0.5)
HEMATOCRIT: 41.2 % (ref 34.8–46.6)
HEMOGLOBIN: 13.4 g/dL (ref 11.6–15.9)
Immature Retic Fract: 5.2 % (ref 1.60–10.00)
LYMPH%: 68.9 % — ABNORMAL HIGH (ref 14.0–49.7)
MCH: 33.1 pg (ref 25.1–34.0)
MCHC: 32.5 g/dL (ref 31.5–36.0)
MCV: 101.6 fL — ABNORMAL HIGH (ref 79.5–101.0)
MONO#: 0.7 10*3/uL (ref 0.1–0.9)
MONO%: 5.1 % (ref 0.0–14.0)
NEUT#: 3.4 10*3/uL (ref 1.5–6.5)
NEUT%: 25 % — AB (ref 38.4–76.8)
Platelets: 198 10*3/uL (ref 145–400)
RBC: 4.05 10*6/uL (ref 3.70–5.45)
RDW: 13.3 % (ref 11.2–14.5)
RETIC CT ABS: 61.97 10*3/uL (ref 33.70–90.70)
Retic %: 1.53 % (ref 0.70–2.10)
WBC: 13.7 10*3/uL — AB (ref 3.9–10.3)
lymph#: 9.5 10*3/uL — ABNORMAL HIGH (ref 0.9–3.3)

## 2014-08-16 LAB — MORPHOLOGY: PLT EST: ADEQUATE

## 2014-08-16 LAB — LACTATE DEHYDROGENASE (CC13): LDH: 172 U/L (ref 125–245)

## 2014-08-16 LAB — DRAW EXTRA CLOT TUBE

## 2014-08-16 NOTE — Progress Notes (Signed)
[place]  [phone] [phone]  Clinic New Consult Note   Patient Care Team: Jerlyn Ly, MD as PCP - General (Internal Medicine) Jerlyn Ly, MD (Internal Medicine) 08/16/2014  CHIEF COMPLAINTS/PURPOSE OF CONSULTATION:  Lymphocytosis   HISTORY OF PRESENTING ILLNESS:  Bethany Oconnor 73 y.o. female is here because of her asymptomatic lymphocytosis which was found on routine blood test.  Per patient, her white count was slightly elevated 1 year ago on the routine lab test. He had repeated lab test on 07/22/2014 by her primary care physician Dr. Joylene Draft. It showed WBC 13.3 K, absolute lymphocyte count 9.4K, hemoglobin 12.4, and platelet count 270 8K. Her TSH was normal. She was then referred to our cancer center for further evaluation.  She feels well in general, has no particular complaints. She denies any fever or chill, no night sweats, no weight loss in the past 5 years. She has macular degeneration, and she cannot see since very well. She denies any particular pain, cough, dyspnea, GI or other symptoms.  MEDICAL HISTORY:  Past Medical History  Diagnosis Date  . Paroxysmal atrial fibrillation   . Cerebral aneurysm     s/p endovascular colling 2008  . Hearing loss   . Diabetes mellitus     borderline  . Hypertension     mild  . Macular degeneration   . Neuropathy   . Cervical cancer     SURGICAL HISTORY: Past Surgical History  Procedure Laterality Date  . Thyroidectomy    . Cervical cone biopsy    . Cataract extraction      bilateral    SOCIAL HISTORY: History   Social History  . Marital Status: Married    Spouse Name: N/A    Number of Children: 2  . Years of Education: N/A   Occupational History  .     Social History Main Topics  . Smoking status: Former Smoker    Quit date: 09/02/1988  . Smokeless tobacco: Not on file  . Alcohol Use: Not on file     Comment: daily for 50 years   . Drug Use: No  . Sexual  Activity: Not on file   Other Topics Concern  . Not on file   Social History Narrative    FAMILY HISTORY: Family History  Problem Relation Age of Onset  . Cerebral aneurysm Mother 31    died  . Cancer Father 82    died    ALLERGIES:  is allergic to codeine.  MEDICATIONS:  Current Outpatient Prescriptions  Medication Sig Dispense Refill  . beta carotene w/minerals (OCUVITE) tablet Take 1 tablet by mouth 2 (two) times daily.    . Calcium Carbonate-Vitamin D 600-400 MG-UNIT per tablet Take 1 tablet by mouth daily.    Marland Kitchen CARTIA XT 180 MG 24 hr capsule TAKE 1 CAPSULE DAILY. NEEDS OFFICE VISIT. 90 capsule 0  . conjugated estrogens-medroxyprogesteron (PREMPHASE) TABS Take 1 tablet by mouth daily.      . dabigatran (PRADAXA) 150 MG CAPS Take 1 capsule (150 mg total) by mouth every 12 (twelve) hours. 180 capsule 3  . hydrochlorothiazide (HYDRODIURIL) 25 MG tablet Take 12.5mg  every Monday and Thursday    . lisinopril (PRINIVIL,ZESTRIL) 10 MG tablet Take 10 mg by mouth daily.      Marland Kitchen LORazepam (ATIVAN) 1 MG tablet Take 1 mg by mouth at bedtime.      . metoprolol (TOPROL-XL) 50 MG 24 hr tablet Take by mouth daily.     Marland Kitchen  pravastatin (PRAVACHOL) 40 MG tablet Take 1 tablet (40 mg total) by mouth daily as needed.    Marland Kitchen VIIBRYD 20 MG TABS Take 1 tablet by mouth daily.     No current facility-administered medications for this visit.    REVIEW OF SYSTEMS:   Constitutional: Denies fevers, chills or abnormal night sweats Eyes: Denies blurriness of vision, double vision or watery eyes Ears, nose, mouth, throat, and face: Denies mucositis or sore throat Respiratory: Denies cough, dyspnea or wheezes Cardiovascular: Denies palpitation, chest discomfort or lower extremity swelling Gastrointestinal:  Denies nausea, heartburn or change in bowel habits Skin: Denies abnormal skin rashes Lymphatics: Denies new lymphadenopathy or easy bruising Neurological:Denies numbness, tingling or new  weaknesses Behavioral/Psych: Mood is stable, no new changes  All other systems were reviewed with the patient and are negative.  PHYSICAL EXAMINATION: ECOG PERFORMANCE STATUS: 0 - Asymptomatic  Filed Vitals:   08/16/14 1409  BP: 124/61  Pulse: 81  Temp: 98.2 F (36.8 C)  Resp: 19   Filed Weights   08/16/14 1409  Weight: 173 lb 11.2 oz (78.79 kg)    GENERAL:alert, no distress and comfortable SKIN: skin color, texture, turgor are normal, no rashes or significant lesions EYES: normal, conjunctiva are pink and non-injected, sclera clear OROPHARYNX:no exudate, no erythema and lips, buccal mucosa, and tongue normal  NECK: supple, thyroid normal size, non-tender, without nodularity LYMPH:  no palpable lymphadenopathy in the cervical, axillary or inguinal LUNGS: clear to auscultation and percussion with normal breathing effort HEART: regular rate & rhythm and no murmurs and no lower extremity edema ABDOMEN:abdomen soft, non-tender and normal bowel sounds Musculoskeletal:no cyanosis of digits and no clubbing  PSYCH: alert & oriented x 3 with fluent speech NEURO: no focal motor/sensory deficits  LABORATORY DATA:  I have reviewed the data as listed     ReviewedResult NoteView in In Basket     CBC & Diff and Retic (Order [id number])      CBC & Diff and Retic  Status: Finalresult Visible to patient:  Not Released Nextappt: 09/14/2014 at 10:15 AM in Oncology ([facility]) Dx:  Lymphocytosis              Ref Range 1:29 PM (08/16/14) 71yr ago (04/07/08) 38yr ago (08/20/07)    WBC 3.9 - 10.3 10e3/uL 13.7 (H) 6.8R 6.4R    NEUT# 1.5 - 6.5 10e3/uL 3.4      HGB 11.6 - 15.9 g/dL 13.4 12.8R 11.6 (L)R    HCT 34.8 - 46.6 % 41.2 37.4R 32.5 (L)R    Platelets 145 - 400 10e3/uL 198 193R 177R    MCV 79.5 - 101.0 fL 101.6 (H) 99.3R 95.8R    MCH 25.1 - 34.0 pg 33.1      MCHC 31.5 - 36.0 g/dL 32.5 34.1R 35.6R    RBC 3.70 - 5.45 10e6/uL 4.05 3.77 (L)R  3.39 (L)R    RDW 11.2 - 14.5 % 13.3 13.4R 13.5R    lymph# 0.9 - 3.3 10e3/uL 9.5 (H)      MONO# 0.1 - 0.9 10e3/uL 0.7      Eosinophils Absolute 0.0 - 0.5 10e3/uL 0.1      Basophils Absolute 0.0 - 0.1 10e3/uL 0.0      NEUT% 38.4 - 76.8 % 25.0 (L)      LYMPH% 14.0 - 49.7 % 68.9 (H)      MONO% 0.0 - 14.0 % 5.1      EOS% 0.0 - 7.0 % 0.8  BASO% 0.0 - 2.0 % 0.2      Retic % 0.70 - 2.10 % 1.53      Retic Ct Abs 33.70 - 90.70 10e3/uL 61.97      Immature Retic Fract 1.60 - 10.00 % 5.20         Morphology  Status: Finalresult Visible to patient:  Not Released Dx:  Lymphocytosis         Ref Range 1:29 PM    Ovalocytes Negative  Few   Smudge Cells Negative  Few   White Cell Comments  Variant Lymphs   PLT EST Adequate  Adequate         Recent Labs  08/16/14 1334  NA 140  K 4.0  CO2 22  GLUCOSE 104  BUN 12.4  CREATININE 0.9  CALCIUM 9.4  PROT 6.5  ALBUMIN 3.8  AST 25  ALT 25  ALKPHOS 45  BILITOT 0.43    RADIOGRAPHIC STUDIES: I have personally reviewed the radiological images as listed and agreed with the findings in the report. No results found.  ASSESSMENT & PLAN:  73 year old Caucasian female with past medical history of brain aneurysm, hypertension, borderline diabetes, macular degeneration, hearing loss, who was incidentally found to have leukocytosis and routine lab since one year ago.  1. 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 I was sent. Cytometry to confirm it. -If she has CLL, this is likely very early stage. She has normal hemoglobin and platelet count, no peripheral adenopathy on physical exam. And she has no symptoms. -I'll obtain a CT of the chest, abdomen and pelvis to further evaluate her adenopathy to ruled out SLL.  -The other differential including infection process, as  intercurrent lymphoma, prolymphocytic leukemia, etc. Since she has had mild leukocytosis over a year, no clinical symptoms of infection, this is unlikely secondary to infection process. I'll wait for  cytometry and the CT scan results to rule out other type of indolent lymphoma.  2. Hypertension and other comorbidity She will continue follow-up with her primary care physician Dr. Joylene Draft.  Plan #1 CT chest abdomen and pelvis with IV contrast to evaluate adenopathy, liver and spleen. #2 return to clinic in 1 month's to review the blood test and the CT scan results.     Orders Placed This Encounter  Procedures  . CT Abdomen W Contrast    Standing Status: Future     Number of Occurrences:      Standing Expiration Date: 08/17/2015    Order Specific Question:  Reason for Exam (SYMPTOM  OR DIAGNOSIS REQUIRED)    Answer:  lymphocytosis, rule out adenopathy    Order Specific Question:  Preferred imaging location?    Answer:  [facility]  . CT Chest W Contrast    Standing Status: Future     Number of Occurrences:      Standing Expiration Date: 08/17/2015    Order Specific Question:  Reason for Exam (SYMPTOM  OR DIAGNOSIS REQUIRED)    Answer:  lymphocytosis, rule out adenopathy    Order Specific Question:  Preferred imaging location?    Answer:  [facility]  . CBC & Diff and Retic    Standing Status: Future     Number of Occurrences: 1     Standing Expiration Date: 08/16/2015  . Morphology    Standing Status: Future  Number of Occurrences: 1     Standing Expiration Date: 08/16/2015  . Draw extra clot tube    Standing Status: Future     Number of Occurrences: 1     Standing Expiration Date: 08/16/2015  . Sedimentation rate    Standing Status: Future     Number of Occurrences: 1     Standing Expiration Date: 08/16/2015  . Comprehensive metabolic panel (Cmet) - [facility]    Standing Status: Future     Number of Occurrences: 1     Standing Expiration Date: 08/16/2015   . Lactate dehydrogenase (LDH) - [facility]    Standing Status: Future     Number of Occurrences: 1     Standing Expiration Date: 08/16/2015  . Flow Cytometry    Standing Status: Future     Number of Occurrences: 1     Standing Expiration Date: 08/16/2015  . Hepatitis B core antibody, total    Standing Status: Future     Number of Occurrences: 1     Standing Expiration Date: 08/16/2015  . Hepatitis C antibody    Standing Status: Future     Number of Occurrences: 1     Standing Expiration Date: 08/16/2015  . Hepatitis B surface antigen    Standing Status: Future     Number of Occurrences: 1     Standing Expiration Date: 08/16/2015  . Hepatitis B surface antibody    Standing Status: Future     Number of Occurrences: 1     Standing Expiration Date: 08/16/2015  . C-reactive protein    Standing Status: Future     Number of Occurrences: 1     Standing Expiration Date: 08/16/2015  . CBC with Differential    Standing Status: Standing     Number of Occurrences: 5     Standing Expiration Date: 08/17/2015  . Lactate dehydrogenase (LDH) - [facility]    Standing Status: Standing     Number of Occurrences: 5     Standing Expiration Date: 08/17/2015  . Comprehensive metabolic panel (Cmet) - [facility]    Standing Status: Standing     Number of Occurrences: 5     Standing Expiration Date: 08/17/2015    All questions were answered. The patient knows to call the clinic with any problems, questions or concerns. I spent 30 minutes counseling the patient face to face. The total time spent in the appointment was 40 minutes and more than 50% was on counseling.     Truitt Merle, MD 08/16/2014 3:19 PM

## 2014-08-16 NOTE — Telephone Encounter (Signed)
Gave avs & cal for Jan. Pt also given Contrast for CT.

## 2014-08-17 LAB — C-REACTIVE PROTEIN: CRP: <0.5 mg/dL (ref <0.60)

## 2014-08-17 LAB — SEDIMENTATION RATE: SED RATE: 1 mm/h (ref 0–22)

## 2014-08-17 LAB — HEPATITIS C ANTIBODY: HCV AB: NEGATIVE

## 2014-08-17 LAB — HEPATITIS B SURFACE ANTIGEN: HEP B S AG: NEGATIVE

## 2014-08-17 LAB — HEPATITIS B SURFACE ANTIBODY,QUALITATIVE: Hep B S Ab: NEGATIVE

## 2014-08-17 LAB — HEPATITIS B CORE ANTIBODY, TOTAL: Hep B Core Total Ab: NONREACTIVE

## 2014-08-19 LAB — FLOW CYTOMETRY

## 2014-09-06 ENCOUNTER — Ambulatory Visit (HOSPITAL_COMMUNITY)
Admission: RE | Admit: 2014-09-06 | Discharge: 2014-09-06 | Disposition: A | Discharge status: Home / Self Care | Payer: Medicare Other | Source: Ambulatory Visit | Attending: Hematology | Admitting: Hematology

## 2014-09-06 DIAGNOSIS — D72820 Lymphocytosis (symptomatic): Secondary | ICD-10-CM

## 2014-09-06 DIAGNOSIS — K5730 Diverticulosis of large intestine without perforation or abscess without bleeding: Secondary | ICD-10-CM | POA: Insufficient documentation

## 2014-09-06 DIAGNOSIS — R079 Chest pain, unspecified: Secondary | ICD-10-CM | POA: Insufficient documentation

## 2014-09-06 DIAGNOSIS — K760 Fatty (change of) liver, not elsewhere classified: Secondary | ICD-10-CM | POA: Insufficient documentation

## 2014-09-06 DIAGNOSIS — R197 Diarrhea, unspecified: Secondary | ICD-10-CM | POA: Insufficient documentation

## 2014-09-06 DIAGNOSIS — Z8541 Personal history of malignant neoplasm of cervix uteri: Secondary | ICD-10-CM | POA: Diagnosis not present

## 2014-09-06 MED ORDER — IOHEXOL 300 MG/ML  SOLN
100.0000 mL | Freq: Once | INTRAMUSCULAR | Status: AC | PRN
  Administered 2014-09-06: 100 mL via INTRAVENOUS

## 2014-09-12 ENCOUNTER — Other Ambulatory Visit: Payer: Self-pay | Admitting: Cardiovascular Disease

## 2014-09-14 ENCOUNTER — Other Ambulatory Visit (HOSPITAL_BASED_OUTPATIENT_CLINIC_OR_DEPARTMENT_OTHER): Payer: Medicare Other

## 2014-09-14 ENCOUNTER — Telehealth: Payer: Self-pay | Admitting: Hematology

## 2014-09-14 ENCOUNTER — Encounter: Payer: Self-pay | Admitting: Hematology

## 2014-09-14 ENCOUNTER — Ambulatory Visit (HOSPITAL_BASED_OUTPATIENT_CLINIC_OR_DEPARTMENT_OTHER): Payer: Medicare Other | Admitting: Hematology

## 2014-09-14 VITALS — BP 139/67 | HR 82 | Temp 97.8°F | Resp 18 | Ht 65.0 in | Wt 174.0 lb

## 2014-09-14 DIAGNOSIS — C9110 Chronic lymphocytic leukemia of B-cell type not having achieved remission: Secondary | ICD-10-CM

## 2014-09-14 DIAGNOSIS — D72820 Lymphocytosis (symptomatic): Secondary | ICD-10-CM

## 2014-09-14 DIAGNOSIS — I10 Essential (primary) hypertension: Secondary | ICD-10-CM

## 2014-09-14 LAB — COMPREHENSIVE METABOLIC PANEL (CC13)
ALK PHOS: 46 U/L (ref 40–150)
ALT: 20 U/L (ref 0–55)
ANION GAP: 9 meq/L (ref 3–11)
AST: 25 U/L (ref 5–34)
Albumin: 3.8 g/dL (ref 3.5–5.0)
BILIRUBIN TOTAL: 0.54 mg/dL (ref 0.20–1.20)
BUN: 13.8 mg/dL (ref 7.0–26.0)
CALCIUM: 8.8 mg/dL (ref 8.4–10.4)
CO2: 24 meq/L (ref 22–29)
CREATININE: 1 mg/dL (ref 0.6–1.1)
Chloride: 110 mEq/L — ABNORMAL HIGH (ref 98–109)
EGFR: 54 mL/min/{1.73_m2} — ABNORMAL LOW (ref >90)
GLUCOSE: 131 mg/dL (ref 70–140)
Potassium: 4 mEq/L (ref 3.5–5.1)
Sodium: 143 mEq/L (ref 136–145)
Total Protein: 6.6 g/dL (ref 6.4–8.3)

## 2014-09-14 LAB — CBC WITH DIFFERENTIAL/PLATELET
BASO%: 0.1 % (ref 0.0–2.0)
Basophils Absolute: 0 10*3/uL (ref 0.0–0.1)
EOS%: 0.7 % (ref 0.0–7.0)
Eosinophils Absolute: 0.1 10*3/uL (ref 0.0–0.5)
HEMATOCRIT: 41.7 % (ref 34.8–46.6)
HGB: 13.9 g/dL (ref 11.6–15.9)
LYMPH#: 9.5 10*3/uL — AB (ref 0.9–3.3)
LYMPH%: 68.7 % — AB (ref 14.0–49.7)
MCH: 33.9 pg (ref 25.1–34.0)
MCHC: 33.3 g/dL (ref 31.5–36.0)
MCV: 101.7 fL — ABNORMAL HIGH (ref 79.5–101.0)
MONO#: 0.6 10*3/uL (ref 0.1–0.9)
MONO%: 4.4 % (ref 0.0–14.0)
NEUT#: 3.6 10*3/uL (ref 1.5–6.5)
NEUT%: 26.1 % — AB (ref 38.4–76.8)
Platelets: 180 10*3/uL (ref 145–400)
RBC: 4.1 10*6/uL (ref 3.70–5.45)
RDW: 13.6 % (ref 11.2–14.5)
WBC: 13.8 10*3/uL — ABNORMAL HIGH (ref 3.9–10.3)

## 2014-09-14 LAB — LACTATE DEHYDROGENASE (CC13): LDH: 148 U/L (ref 125–245)

## 2014-09-14 NOTE — Progress Notes (Deleted)
Ord  [phone] [phone]  Clinic New Consult Note   Patient Care Team: Jerlyn Ly, MD as PCP - General (Internal Medicine) Jerlyn Ly, MD (Internal Medicine) 09/14/2014  CHIEF COMPLAINTS Follow-up CLL, stage 0   HISTORY OF INITIAL PRESENTING ILLNESS:  Bethany Oconnor 74 y.o. female is here because of her asymptomatic lymphocytosis which was found on routine blood test.  Per patient, her white count was slightly elevated 1 year ago on the routine lab test. He had repeated lab test on 07/22/2014 by her primary care physician Dr. Joylene Draft. It showed WBC 13.3 K, absolute lymphocyte count 9.4K, hemoglobin 12.4, and platelet count 270 8K. Her TSH was normal. She was then referred to our cancer center for further evaluation.  She feels well in general, has no particular complaints. She denies any fever or chill, no night sweats, no weight loss in the past 5 years. She has macular degeneration, and she cannot see since very well. She denies any particular pain, cough, dyspnea, GI or other symptoms.  INTERIM HISTORY Bethany Oconnor returns for follow-up. She has been doing well overall, no new complaints. She has good energy level and appetite, weight is stable. She denies any fever chills or night sweats.   MEDICAL HISTORY:  Past Medical History  Diagnosis Date  . Paroxysmal atrial fibrillation   . Cerebral aneurysm     s/p endovascular colling 2008  . Hearing loss   . Diabetes mellitus     borderline  . Hypertension     mild  . Macular degeneration   . Neuropathy   . Cervical cancer     SURGICAL HISTORY: Past Surgical History  Procedure Laterality Date  . Thyroidectomy    . Cervical cone biopsy    . Cataract extraction      bilateral    SOCIAL HISTORY: History   Social History  . Marital Status: Married    Spouse Name: N/A    Number of Children: 2  . Years of Education: N/A   Occupational History  .     Social  History Main Topics  . Smoking status: Former Smoker    Quit date: 09/02/1988  . Smokeless tobacco: Not on file  . Alcohol Use: Not on file     Comment: daily for 50 years   . Drug Use: No  . Sexual Activity: Not on file   Other Topics Concern  . Not on file   Social History Narrative    FAMILY HISTORY: Family History  Problem Relation Age of Onset  . Cerebral aneurysm Mother 68    died  . Cancer Father 77    died    ALLERGIES:  is allergic to codeine.  MEDICATIONS:  Current Outpatient Prescriptions  Medication Sig Dispense Refill  . beta carotene w/minerals (OCUVITE) tablet Take 1 tablet by mouth 2 (two) times daily.    . Calcium Carbonate-Vitamin D 600-400 MG-UNIT per tablet Take 1 tablet by mouth daily.    Marland Kitchen CARTIA XT 180 MG 24 hr capsule TAKE 1 CAPSULE BY MOUTH DAILY. NEEDS OFFICE VISIT. 90 capsule 1  . conjugated estrogens-medroxyprogesteron (PREMPHASE) TABS Take 1 tablet by mouth daily.      . dabigatran (PRADAXA) 150 MG CAPS Take 1 capsule (150 mg total) by mouth every 12 (twelve) hours. 180 capsule 3  . hydrochlorothiazide (HYDRODIURIL) 25 MG tablet Take 12.5mg  every Monday and Thursday    . lisinopril (PRINIVIL,ZESTRIL) 10 MG tablet Take 10 mg by mouth  daily.      Marland Kitchen LORazepam (ATIVAN) 1 MG tablet Take 1 mg by mouth at bedtime.      . metoprolol (TOPROL-XL) 50 MG 24 hr tablet Take by mouth daily.     . pravastatin (PRAVACHOL) 40 MG tablet Take 1 tablet (40 mg total) by mouth daily as needed.    Marland Kitchen PREMPRO 0.625-5 MG per tablet   3  . VIIBRYD 20 MG TABS Take 1 tablet by mouth daily.     No current facility-administered medications for this visit.    REVIEW OF SYSTEMS:   Constitutional: Denies fevers, chills or abnormal night sweats Eyes: Denies blurriness of vision, double vision or watery eyes Ears, nose, mouth, throat, and face: Denies mucositis or sore throat Respiratory: Denies cough, dyspnea or wheezes Cardiovascular: Denies palpitation, chest discomfort  or lower extremity swelling Gastrointestinal:  Denies nausea, heartburn or change in bowel habits Skin: Denies abnormal skin rashes Lymphatics: Denies new lymphadenopathy or easy bruising Neurological:Denies numbness, tingling or new weaknesses Behavioral/Psych: Mood is stable, no new changes  All other systems were reviewed with the patient and are negative.  PHYSICAL EXAMINATION: ECOG PERFORMANCE STATUS: 0 - Asymptomatic  Filed Vitals:   09/14/14 1059  BP: 139/67  Pulse: 82  Temp: 97.8 F (36.6 C)  Resp: 18   Filed Weights   09/14/14 1059  Weight: 174 lb (78.926 kg)    GENERAL:alert, no distress and comfortable SKIN: skin color, texture, turgor are normal, no rashes or significant lesions EYES: normal, conjunctiva are pink and non-injected, sclera clear OROPHARYNX:no exudate, no erythema and lips, buccal mucosa, and tongue normal  NECK: supple, thyroid normal size, non-tender, without nodularity LYMPH:  no palpable lymphadenopathy in the cervical, axillary or inguinal LUNGS: clear to auscultation and percussion with normal breathing effort HEART: regular rate & rhythm and no murmurs and no lower extremity edema ABDOMEN:abdomen soft, non-tender and normal bowel sounds Musculoskeletal:no cyanosis of digits and no clubbing  PSYCH: alert & oriented x 3 with fluent speech NEURO: no focal motor/sensory deficits  LABORATORY DATA:  I have reviewed the data as listed     ReviewedResult NoteView in In Basket     CBC & Diff and Retic (Order [id number])      CBC & Diff and Retic  Status: Finalresult Visible to patient:  Not Released Nextappt: 09/14/2014 at 10:15 AM in Oncology ([facility]) Dx:  Lymphocytosis              Ref Range 1:29 PM (08/16/14) 70yr ago (04/07/08) 40yr ago (08/20/07)    WBC 3.9 - 10.3 10e3/uL 13.7 (H) 6.8R 6.4R    NEUT# 1.5 - 6.5 10e3/uL 3.4      HGB 11.6 - 15.9 g/dL 13.4 12.8R 11.6 (L)R    HCT 34.8 - 46.6 % 41.2  37.4R 32.5 (L)R    Platelets 145 - 400 10e3/uL 198 193R 177R    MCV 79.5 - 101.0 fL 101.6 (H) 99.3R 95.8R    MCH 25.1 - 34.0 pg 33.1      MCHC 31.5 - 36.0 g/dL 32.5 34.1R 35.6R    RBC 3.70 - 5.45 10e6/uL 4.05 3.77 (L)R 3.39 (L)R    RDW 11.2 - 14.5 % 13.3 13.4R 13.5R    lymph# 0.9 - 3.3 10e3/uL 9.5 (H)      MONO# 0.1 - 0.9 10e3/uL 0.7      Eosinophils Absolute 0.0 - 0.5 10e3/uL 0.1      Basophils Absolute 0.0 - 0.1 10e3/uL 0.0  NEUT% 38.4 - 76.8 % 25.0 (L)      LYMPH% 14.0 - 49.7 % 68.9 (H)      MONO% 0.0 - 14.0 % 5.1      EOS% 0.0 - 7.0 % 0.8      BASO% 0.0 - 2.0 % 0.2      Retic % 0.70 - 2.10 % 1.53      Retic Ct Abs 33.70 - 90.70 10e3/uL 61.97      Immature Retic Fract 1.60 - 10.00 % 5.20         Morphology  Status: Finalresult Visible to patient:  Not Released Dx:  Lymphocytosis         Ref Range 1:29 PM    Ovalocytes Negative  Few   Smudge Cells Negative  Few   White Cell Comments  Variant Lymphs   PLT EST Adequate  Adequate         Recent Labs  08/16/14 1334 09/14/14 1029  NA 140 143  K 4.0 4.0  CO2 22 24  GLUCOSE 104 131  BUN 12.4 13.8  CREATININE 0.9 1.0  CALCIUM 9.4 8.8  PROT 6.5 6.6  ALBUMIN 3.8 3.8  AST 25 25  ALT 25 20  ALKPHOS 45 46  BILITOT 0.43 0.54   Flow cytometry 08/16/2014 Peripheral Blood Flow Cytometry - ABNORMAL B-CELL POPULATION DETECTED, SEE COMMENT. Diagnosis Comment: There is an abnormal population of B-cells with expression of B-cell markers including CD23. CD20 expression is dim and light chain expression is too dim for accurate assessment of clonality. There is no significant expression of CD5 or CD10. Markers for hairy cell leukemia are negative. Review of the peripheral blood reveals a lymphocytosis with predominately small lymphocytes with coarse chromatin and scant cytoplasm. 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 The expression of CD23, dim  CD20, dim light chains, and morphology are most suggestive of chronic lymphocytic leukemia, however, CD5 is negative which is rather uncommon. Correlation with cytogenetics may be of some utility.  RADIOGRAPHIC STUDIES: I have personally reviewed the radiological images as listed and agreed with the findings in the report. Ct Chest W Contrast  09/06/2014   CLINICAL DATA:  Lymphocytosis. Left chest pain. Diarrhea. Personal history of cervical carcinoma.  EXAM: CT CHEST AND ABDOMEN WITH CONTRAST  TECHNIQUE: Multidetector CT imaging of the chest and abdomen was performed following the standard protocol during bolus administration of intravenous contrast.  CONTRAST:  122mL OMNIPAQUE IOHEXOL 300 MG/ML  SOLN  COMPARISON:  None.  FINDINGS: CT CHEST FINDINGS  Mediastinum/Hilar Regions: No masses or pathologically enlarged lymph nodes identified.  Other Thoracic Lymphadenopathy:  None.  Lungs: No pulmonary infiltrate or mass identified. Pleural-parenchymal scarring noted in lateral left lung base.  Pleura:  No evidence of effusion or mass.  Vascular/Cardiac:  No acute findings identified.  Other:  None.  Musculoskeletal:  No suspicious bone lesions identified.  CT ABDOMEN FINDINGS  Hepatobiliary: Small cyst noted in the medial segment of the left hepatic lobe. No liver masses are identified. Gallbladder is unremarkable. Mild hepatic steatosis noted.  Pancreas: No mass, inflammatory changes, or other parenchymal abnormality identified.  Spleen:  Within normal limits in size and appearance.  Adrenal Glands:  No mass identified.  Kidneys: No masses identified. Small renal cysts noted bilaterally. No evidence of hydronephrosis.  Stomach/Bowel/Peritoneum: Colonic diverticulosis noted. No evidence of diverticulitis seen involving the visualized portion of the colon. No other inflammatory process identified.  Vascular/Lymphatic: No pathologically enlarged lymph nodes identified.  No other significant abnormality noted.  Other:   None.  Musculoskeletal:  No suspicious bone lesions identified.  IMPRESSION: No evidence of mass, lymphadenopathy, or other acute findings.  Mild hepatic steatosis and colonic diverticulosis incidentally noted.   Electronically Signed   By: Earle Gell M.D.   On: 09/06/2014 10:06   Ct Abdomen W Contrast  09/06/2014   CLINICAL DATA:  Lymphocytosis. Left chest pain. Diarrhea. Personal history of cervical carcinoma.  EXAM: CT CHEST AND ABDOMEN WITH CONTRAST  TECHNIQUE: Multidetector CT imaging of the chest and abdomen was performed following the standard protocol during bolus administration of intravenous contrast.  CONTRAST:  122mL OMNIPAQUE IOHEXOL 300 MG/ML  SOLN  COMPARISON:  None.  FINDINGS: CT CHEST FINDINGS  Mediastinum/Hilar Regions: No masses or pathologically enlarged lymph nodes identified.  Other Thoracic Lymphadenopathy:  None.  Lungs: No pulmonary infiltrate or mass identified. Pleural-parenchymal scarring noted in lateral left lung base.  Pleura:  No evidence of effusion or mass.  Vascular/Cardiac:  No acute findings identified.  Other:  None.  Musculoskeletal:  No suspicious bone lesions identified.  CT ABDOMEN FINDINGS  Hepatobiliary: Small cyst noted in the medial segment of the left hepatic lobe. No liver masses are identified. Gallbladder is unremarkable. Mild hepatic steatosis noted.  Pancreas: No mass, inflammatory changes, or other parenchymal abnormality identified.  Spleen:  Within normal limits in size and appearance.  Adrenal Glands:  No mass identified.  Kidneys: No masses identified. Small renal cysts noted bilaterally. No evidence of hydronephrosis.  Stomach/Bowel/Peritoneum: Colonic diverticulosis noted. No evidence of diverticulitis seen involving the visualized portion of the colon. No other inflammatory process identified.  Vascular/Lymphatic: No pathologically enlarged lymph nodes identified. No other significant abnormality noted.  Other:  None.  Musculoskeletal:  No suspicious  bone lesions identified.  IMPRESSION: No evidence of mass, lymphadenopathy, or other acute findings.  Mild hepatic steatosis and colonic diverticulosis incidentally noted.   Electronically Signed   By: Earle Gell M.D.   On: 09/06/2014 10:06    ASSESSMENT & PLAN:  75 year old Caucasian female with past medical history of brain aneurysm, hypertension, borderline diabetes, macular degeneration, hearing loss, who was incidentally found to have leukocytosis and routine lab since one year ago.  1. Chronic ascitic leukemia (CLL), stage 0  -I reviewed her flow cytometry results with her and her husband. This is most consistent with CLL. She only has mild lymphocytosis at 9.5K, no adenopathy or splenomegaly by physical exam and CAT scan. No anemia and some cytopenia. -She feels well without any symptoms. I recommend observation at this stage. I explained to her she has very early stage disease, and it could be a very intercurrent course and she may not treatment for long time.  -Follow-up her CBC and differential closely.  2. Hypertension and other comorbidity She will continue follow-up with her primary care physician Dr. Joylene Draft.  Plan #1 RTC in 3 month with lab CBC+diff  -I will copy your result to your PCP and GYN     All questions were answered. The patient knows to call the clinic with any problems, questions or concerns. I spent 20 minutes counseling the patient face to face. The total time spent in the appointment was 25 minutes and more than 50% was on counseling.     Truitt Merle, MD 09/14/2014 11:27 AM

## 2014-09-14 NOTE — Progress Notes (Addendum)
[place]  Telephone:(336) [phone] Fax:([phone]  Clinic New Consult Note   Patient Care Team: Jerlyn Ly, MD as PCP - General (Internal Medicine) Jerlyn Ly, MD (Internal Medicine) 09/14/2014  CHIEF COMPLAINTS Follow-up CLL, stage 0   HISTORY OF INITIAL PRESENTING ILLNESS:  Bethany Oconnor 74 y.o. female is here because of her asymptomatic lymphocytosis which was found on routine blood test.  Per patient, her white count was slightly elevated 1 year ago on the routine lab test. He had repeated lab test on 07/22/2014 by her primary care physician Dr. Joylene Draft. It showed WBC 13.3 K, absolute lymphocyte count 9.4K, hemoglobin 12.4, and platelet count 270 8K. Her TSH was normal. She was then referred to our cancer center for further evaluation.  She feels well in general, has no particular complaints. She denies any fever or chill, no night sweats, no weight loss in the past 5 years. She has macular degeneration, and she cannot see since very well. She denies any particular pain, cough, dyspnea, GI or other symptoms.  INTERIM HISTORY Bethany Oconnor returns for follow-up. She has been doing well overall, no new complaints. She has good energy level and appetite, weight is stable. She denies any fever chills or night sweats.   MEDICAL HISTORY:  Past Medical History  Diagnosis Date  . Paroxysmal atrial fibrillation   . Cerebral aneurysm     s/p endovascular colling 2008  . Hearing loss   . Diabetes mellitus     borderline  . Hypertension     mild  . Macular degeneration   . Neuropathy   . Cervical cancer     SURGICAL HISTORY: Past Surgical History  Procedure Laterality Date  . Thyroidectomy    . Cervical cone biopsy    . Cataract extraction      bilateral    SOCIAL HISTORY: History   Social History  . Marital Status: Married    Spouse Name: N/A    Number of Children: 2  . Years of Education: N/A   Occupational History  .     Social  History Main Topics  . Smoking status: Former Smoker    Quit date: 09/02/1988  . Smokeless tobacco: Not on file  . Alcohol Use: Not on file     Comment: daily for 50 years   . Drug Use: No  . Sexual Activity: Not on file   Other Topics Concern  . Not on file   Social History Narrative    FAMILY HISTORY: Family History  Problem Relation Age of Onset  . Cerebral aneurysm Mother 32    died  . Cancer Father 9    died    ALLERGIES:  is allergic to codeine.  MEDICATIONS:  Current Outpatient Prescriptions  Medication Sig Dispense Refill  . beta carotene w/minerals (OCUVITE) tablet Take 1 tablet by mouth 2 (two) times daily.    . Calcium Carbonate-Vitamin D 600-400 MG-UNIT per tablet Take 1 tablet by mouth daily.    Marland Kitchen CARTIA XT 180 MG 24 hr capsule TAKE 1 CAPSULE BY MOUTH DAILY. NEEDS OFFICE VISIT. 90 capsule 1  . conjugated estrogens-medroxyprogesteron (PREMPHASE) TABS Take 1 tablet by mouth daily.      . dabigatran (PRADAXA) 150 MG CAPS Take 1 capsule (150 mg total) by mouth every 12 (twelve) hours. 180 capsule 3  . hydrochlorothiazide (HYDRODIURIL) 25 MG tablet Take 12.5mg  every Monday and Thursday    . lisinopril (PRINIVIL,ZESTRIL) 10 MG tablet Take 10 mg by mouth  daily.      Marland Kitchen LORazepam (ATIVAN) 1 MG tablet Take 1 mg by mouth at bedtime.      . metoprolol (TOPROL-XL) 50 MG 24 hr tablet Take by mouth daily.     . pravastatin (PRAVACHOL) 40 MG tablet Take 1 tablet (40 mg total) by mouth daily as needed.    Marland Kitchen PREMPRO 0.625-5 MG per tablet   3  . VIIBRYD 20 MG TABS Take 1 tablet by mouth daily.     No current facility-administered medications for this visit.    REVIEW OF SYSTEMS:   Constitutional: Denies fevers, chills or abnormal night sweats Eyes: Denies blurriness of vision, double vision or watery eyes Ears, nose, mouth, throat, and face: Denies mucositis or sore throat Respiratory: Denies cough, dyspnea or wheezes Cardiovascular: Denies palpitation, chest discomfort  or lower extremity swelling Gastrointestinal:  Denies nausea, heartburn or change in bowel habits Skin: Denies abnormal skin rashes Lymphatics: Denies new lymphadenopathy or easy bruising Neurological:Denies numbness, tingling or new weaknesses Behavioral/Psych: Mood is stable, no new changes  All other systems were reviewed with the patient and are negative.  PHYSICAL EXAMINATION: ECOG PERFORMANCE STATUS: 0 - Asymptomatic  Filed Vitals:   09/14/14 1059  BP: 139/67  Pulse: 82  Temp: 97.8 F (36.6 C)  Resp: 18   Filed Weights   09/14/14 1059  Weight: 174 lb (78.926 kg)    GENERAL:alert, no distress and comfortable SKIN: skin color, texture, turgor are normal, no rashes or significant lesions EYES: normal, conjunctiva are pink and non-injected, sclera clear OROPHARYNX:no exudate, no erythema and lips, buccal mucosa, and tongue normal  NECK: supple, thyroid normal size, non-tender, without nodularity LYMPH:  no palpable lymphadenopathy in the cervical, axillary or inguinal LUNGS: clear to auscultation and percussion with normal breathing effort HEART: regular rate & rhythm and no murmurs and no lower extremity edema ABDOMEN:abdomen soft, non-tender and normal bowel sounds Musculoskeletal:no cyanosis of digits and no clubbing  PSYCH: alert & oriented x 3 with fluent speech NEURO: no focal motor/sensory deficits  LABORATORY DATA:  I have reviewed the data as listed     ReviewedResult NoteView in In Basket     CBC & Diff and Retic (Order [id number])      CBC & Diff and Retic  Status: Finalresult Visible to patient:  Not Released Nextappt: 09/14/2014 at 10:15 AM in Oncology ([facility]) Dx:  Lymphocytosis              Ref Range 1:29 PM (08/16/14) 32yr ago (04/07/08) 85yr ago (08/20/07)    WBC 3.9 - 10.3 10e3/uL 13.7 (H) 6.8R 6.4R    NEUT# 1.5 - 6.5 10e3/uL 3.4      HGB 11.6 - 15.9 g/dL 13.4 12.8R 11.6 (L)R    HCT 34.8 - 46.6 % 41.2  37.4R 32.5 (L)R    Platelets 145 - 400 10e3/uL 198 193R 177R    MCV 79.5 - 101.0 fL 101.6 (H) 99.3R 95.8R    MCH 25.1 - 34.0 pg 33.1      MCHC 31.5 - 36.0 g/dL 32.5 34.1R 35.6R    RBC 3.70 - 5.45 10e6/uL 4.05 3.77 (L)R 3.39 (L)R    RDW 11.2 - 14.5 % 13.3 13.4R 13.5R    lymph# 0.9 - 3.3 10e3/uL 9.5 (H)      MONO# 0.1 - 0.9 10e3/uL 0.7      Eosinophils Absolute 0.0 - 0.5 10e3/uL 0.1      Basophils Absolute 0.0 - 0.1 10e3/uL 0.0  NEUT% 38.4 - 76.8 % 25.0 (L)      LYMPH% 14.0 - 49.7 % 68.9 (H)      MONO% 0.0 - 14.0 % 5.1      EOS% 0.0 - 7.0 % 0.8      BASO% 0.0 - 2.0 % 0.2      Retic % 0.70 - 2.10 % 1.53      Retic Ct Abs 33.70 - 90.70 10e3/uL 61.97      Immature Retic Fract 1.60 - 10.00 % 5.20         Morphology  Status: Finalresult Visible to patient:  Not Released Dx:  Lymphocytosis         Ref Range 1:29 PM    Ovalocytes Negative  Few   Smudge Cells Negative  Few   White Cell Comments  Variant Lymphs   PLT EST Adequate  Adequate         Recent Labs  08/16/14 1334 09/14/14 1029  NA 140 143  K 4.0 4.0  CO2 22 24  GLUCOSE 104 131  BUN 12.4 13.8  CREATININE 0.9 1.0  CALCIUM 9.4 8.8  PROT 6.5 6.6  ALBUMIN 3.8 3.8  AST 25 25  ALT 25 20  ALKPHOS 45 46  BILITOT 0.43 0.54   Flow cytometry 08/16/2014 Peripheral Blood Flow Cytometry - ABNORMAL B-CELL POPULATION DETECTED, SEE COMMENT. Diagnosis Comment: There is an abnormal population of B-cells with expression of B-cell markers including CD23. CD20 expression is dim and light chain expression is too dim for accurate assessment of clonality. There is no significant expression of CD5 or CD10. Markers for hairy cell leukemia are negative. Review of the peripheral blood reveals a lymphocytosis with predominately small lymphocytes with coarse chromatin and scant cytoplasm. 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 The expression of CD23, dim  CD20, dim light chains, and morphology are most suggestive of chronic lymphocytic leukemia, however, CD5 is negative which is rather uncommon. Correlation with cytogenetics may be of some utility.  RADIOGRAPHIC STUDIES: I have personally reviewed the radiological images as listed and agreed with the findings in the report. Ct Chest W Contrast  09/06/2014   CLINICAL DATA:  Lymphocytosis. Left chest pain. Diarrhea. Personal history of cervical carcinoma.  EXAM: CT CHEST AND ABDOMEN WITH CONTRAST  TECHNIQUE: Multidetector CT imaging of the chest and abdomen was performed following the standard protocol during bolus administration of intravenous contrast.  CONTRAST:  148mL OMNIPAQUE IOHEXOL 300 MG/ML  SOLN  COMPARISON:  None.  FINDINGS: CT CHEST FINDINGS  Mediastinum/Hilar Regions: No masses or pathologically enlarged lymph nodes identified.  Other Thoracic Lymphadenopathy:  None.  Lungs: No pulmonary infiltrate or mass identified. Pleural-parenchymal scarring noted in lateral left lung base.  Pleura:  No evidence of effusion or mass.  Vascular/Cardiac:  No acute findings identified.  Other:  None.  Musculoskeletal:  No suspicious bone lesions identified.  CT ABDOMEN FINDINGS  Hepatobiliary: Small cyst noted in the medial segment of the left hepatic lobe. No liver masses are identified. Gallbladder is unremarkable. Mild hepatic steatosis noted.  Pancreas: No mass, inflammatory changes, or other parenchymal abnormality identified.  Spleen:  Within normal limits in size and appearance.  Adrenal Glands:  No mass identified.  Kidneys: No masses identified. Small renal cysts noted bilaterally. No evidence of hydronephrosis.  Stomach/Bowel/Peritoneum: Colonic diverticulosis noted. No evidence of diverticulitis seen involving the visualized portion of the colon. No other inflammatory process identified.  Vascular/Lymphatic: No pathologically enlarged lymph nodes identified.  No other significant abnormality noted.  Other:   None.  Musculoskeletal:  No suspicious bone lesions identified.  IMPRESSION: No evidence of mass, lymphadenopathy, or other acute findings.  Mild hepatic steatosis and colonic diverticulosis incidentally noted.   Electronically Signed   By: Earle Gell M.D.   On: 09/06/2014 10:06   Ct Abdomen W Contrast  09/06/2014   CLINICAL DATA:  Lymphocytosis. Left chest pain. Diarrhea. Personal history of cervical carcinoma.  EXAM: CT CHEST AND ABDOMEN WITH CONTRAST  TECHNIQUE: Multidetector CT imaging of the chest and abdomen was performed following the standard protocol during bolus administration of intravenous contrast.  CONTRAST:  138mL OMNIPAQUE IOHEXOL 300 MG/ML  SOLN  COMPARISON:  None.  FINDINGS: CT CHEST FINDINGS  Mediastinum/Hilar Regions: No masses or pathologically enlarged lymph nodes identified.  Other Thoracic Lymphadenopathy:  None.  Lungs: No pulmonary infiltrate or mass identified. Pleural-parenchymal scarring noted in lateral left lung base.  Pleura:  No evidence of effusion or mass.  Vascular/Cardiac:  No acute findings identified.  Other:  None.  Musculoskeletal:  No suspicious bone lesions identified.  CT ABDOMEN FINDINGS  Hepatobiliary: Small cyst noted in the medial segment of the left hepatic lobe. No liver masses are identified. Gallbladder is unremarkable. Mild hepatic steatosis noted.  Pancreas: No mass, inflammatory changes, or other parenchymal abnormality identified.  Spleen:  Within normal limits in size and appearance.  Adrenal Glands:  No mass identified.  Kidneys: No masses identified. Small renal cysts noted bilaterally. No evidence of hydronephrosis.  Stomach/Bowel/Peritoneum: Colonic diverticulosis noted. No evidence of diverticulitis seen involving the visualized portion of the colon. No other inflammatory process identified.  Vascular/Lymphatic: No pathologically enlarged lymph nodes identified. No other significant abnormality noted.  Other:  None.  Musculoskeletal:  No suspicious  bone lesions identified.  IMPRESSION: No evidence of mass, lymphadenopathy, or other acute findings.  Mild hepatic steatosis and colonic diverticulosis incidentally noted.   Electronically Signed   By: Earle Gell M.D.   On: 09/06/2014 10:06    ASSESSMENT & PLAN:  74 year old Caucasian female with past medical history of brain aneurysm, hypertension, borderline diabetes, macular degeneration, hearing loss, who was incidentally found to have leukocytosis and routine lab since one year ago.  1. Chronic ascitic leukemia (CLL), stage 0  -I reviewed her flow cytometry results with her and her husband. This is most consistent with CLL. She only has mild lymphocytosis at 9.5K, no adenopathy or splenomegaly by physical exam and CAT scan. No anemia and some cytopenia. -She feels well without any symptoms. I recommend observation at this stage. I explained to her she has very early stage disease, and it could be a very intercurrent course and she may not treatment for long time.  The treatment indication I usually symptomatic CLL, advanced stage no rapid lymphocytes doubling time. -I'll check if she has high risk features, which includes del(17p), del(11q), trisomy 12, and del(13q) by FISH, beta-2 microglobulin, ZAP70, CD 38, IGHV mutation status.  -Follow-up her CBC and differential closely.  2. Hypertension and other comorbidity She will continue follow-up with her primary care physician Dr. Joylene Draft.  Plan #1 RTC in 3 month with lab  -I will copy your result to your PCP and GYN     All questions were answered. The patient knows to call the clinic with any problems, questions or concerns. I spent 20 minutes counseling the patient face to face. The total time spent in the appointment was 25 minutes and more than 50% was on counseling.  Truitt Merle, MD 09/14/2014 11:27 AM

## 2014-09-14 NOTE — Telephone Encounter (Signed)
gv adn printed aptp sched and avs for pt for April °

## 2014-09-15 NOTE — Addendum Note (Signed)
Addended by: Truitt Merle on: 09/15/2014 09:01 AM   Modules accepted: Orders

## 2014-09-15 NOTE — Addendum Note (Signed)
Addended by: Truitt Merle on: 09/15/2014 09:04 AM   Modules accepted: Orders

## 2014-10-05 ENCOUNTER — Telehealth: Payer: Self-pay | Admitting: Hematology

## 2014-10-05 NOTE — Telephone Encounter (Signed)
Patient confirm appointment change d from 04/13 to 04/14. Mailed calendar.

## 2014-12-14 ENCOUNTER — Ambulatory Visit: Payer: Medicare Other | Admitting: Hematology

## 2014-12-14 ENCOUNTER — Other Ambulatory Visit: Payer: Medicare Other

## 2014-12-15 ENCOUNTER — Other Ambulatory Visit (HOSPITAL_BASED_OUTPATIENT_CLINIC_OR_DEPARTMENT_OTHER): Payer: Medicare Other

## 2014-12-15 ENCOUNTER — Ambulatory Visit (HOSPITAL_BASED_OUTPATIENT_CLINIC_OR_DEPARTMENT_OTHER): Payer: Medicare Other | Admitting: Hematology

## 2014-12-15 ENCOUNTER — Telehealth: Payer: Self-pay | Admitting: Hematology

## 2014-12-15 ENCOUNTER — Other Ambulatory Visit (HOSPITAL_COMMUNITY)
Admission: RE | Admit: 2014-12-15 | Discharge: 2014-12-15 | Disposition: A | Discharge status: Home / Self Care | Payer: Medicare Other | Source: Ambulatory Visit | Attending: Hematology | Admitting: Hematology

## 2014-12-15 ENCOUNTER — Encounter: Payer: Self-pay | Admitting: Hematology

## 2014-12-15 VITALS — BP 127/59 | HR 73 | Temp 97.7°F | Resp 17 | Ht 65.0 in | Wt 178.9 lb

## 2014-12-15 DIAGNOSIS — C9110 Chronic lymphocytic leukemia of B-cell type not having achieved remission: Secondary | ICD-10-CM

## 2014-12-15 DIAGNOSIS — D72820 Lymphocytosis (symptomatic): Secondary | ICD-10-CM

## 2014-12-15 DIAGNOSIS — D72829 Elevated white blood cell count, unspecified: Secondary | ICD-10-CM | POA: Insufficient documentation

## 2014-12-15 DIAGNOSIS — I10 Essential (primary) hypertension: Secondary | ICD-10-CM | POA: Diagnosis not present

## 2014-12-15 LAB — CBC WITH DIFFERENTIAL/PLATELET
BASO%: 0.4 % (ref 0.0–2.0)
BASOS ABS: 0.1 10*3/uL (ref 0.0–0.1)
EOS ABS: 0.1 10*3/uL (ref 0.0–0.5)
EOS%: 1 % (ref 0.0–7.0)
HCT: 39.8 % (ref 34.8–46.6)
HEMOGLOBIN: 13 g/dL (ref 11.6–15.9)
LYMPH%: 68.4 % — ABNORMAL HIGH (ref 14.0–49.7)
MCH: 33 pg (ref 25.1–34.0)
MCHC: 32.6 g/dL (ref 31.5–36.0)
MCV: 101.4 fL — AB (ref 79.5–101.0)
MONO#: 0.6 10*3/uL (ref 0.1–0.9)
MONO%: 4.4 % (ref 0.0–14.0)
NEUT%: 25.8 % — ABNORMAL LOW (ref 38.4–76.8)
NEUTROS ABS: 3.5 10*3/uL (ref 1.5–6.5)
Platelets: 203 10*3/uL (ref 145–400)
RBC: 3.93 10*6/uL (ref 3.70–5.45)
RDW: 13.2 % (ref 11.2–14.5)
WBC: 13.5 10*3/uL — ABNORMAL HIGH (ref 3.9–10.3)
lymph#: 9.2 10*3/uL — ABNORMAL HIGH (ref 0.9–3.3)

## 2014-12-15 LAB — TECHNOLOGIST REVIEW

## 2014-12-15 NOTE — Telephone Encounter (Signed)
gave and printed appt sched and avs fo rpt for JULY and OCT

## 2014-12-15 NOTE — Progress Notes (Signed)
[place]  Telephone:(336) [phone] Fax:(336) [phone]  Clinic New Consult Note   Patient Care Team: Crist Infante, MD as PCP - General (Internal Medicine) Crist Infante, MD (Internal Medicine) Newton Pigg, MD as Consulting Physician (Obstetrics and Gynecology) 12/15/2014  CHIEF COMPLAINTS Follow-up CLL, stage 0   HISTORY OF INITIAL PRESENTING ILLNESS:  Bethany Oconnor 74 y.o. female is here because of her asymptomatic lymphocytosis which was found on routine blood test.  Per patient, her white count was slightly elevated 1 year ago on the routine lab test. He had repeated lab test on 07/22/2014 by her primary care physician Dr. Joylene Draft. It showed WBC 13.3 K, absolute lymphocyte count 9.4K, hemoglobin 12.4, and platelet count 270 8K. Her TSH was normal. She was then referred to our cancer center for further evaluation.  She feels well in general, has no particular complaints. She denies any fever or chill, no night sweats, no weight loss in the past 5 years. She has macular degeneration, and she cannot see since very well. She denies any particular pain, cough, dyspnea, GI or other symptoms.  INTERIM HISTORY [name] returns for follow-up. She has been doing well overall, no new complaints. She has good energy level and appetite, weight is stable. She denies any fever chills or night sweats.   MEDICAL HISTORY:  Past Medical History  Diagnosis Date  . Paroxysmal atrial fibrillation   . Cerebral aneurysm     s/p endovascular colling 2008  . Hearing loss   . Diabetes mellitus     borderline  . Hypertension     mild  . Macular degeneration   . Neuropathy   . Cervical cancer     SURGICAL HISTORY: Past Surgical History  Procedure Laterality Date  . Thyroidectomy    . Cervical cone biopsy    . Cataract extraction      bilateral    SOCIAL HISTORY: History   Social History  . Marital Status: Married    Spouse Name: N/A  . Number of Children: 2  . Years  of Education: N/A   Occupational History  .     Social History Main Topics  . Smoking status: Former Smoker    Quit date: 09/02/1988  . Smokeless tobacco: Not on file  . Alcohol Use: Not on file     Comment: daily for 50 years   . Drug Use: No  . Sexual Activity: Not on file   Other Topics Concern  . Not on file   Social History Narrative    FAMILY HISTORY: Family History  Problem Relation Age of Onset  . Cerebral aneurysm Mother 18    died  . Cancer Father 93    died    ALLERGIES:  is allergic to codeine.  MEDICATIONS:  Current Outpatient Prescriptions  Medication Sig Dispense Refill  . beta carotene w/minerals (OCUVITE) tablet Take 1 tablet by mouth 2 (two) times daily.    . Calcium Carbonate-Vitamin D 600-400 MG-UNIT per tablet Take 1 tablet by mouth daily.    Marland Kitchen CARTIA XT 180 MG 24 hr capsule TAKE 1 CAPSULE BY MOUTH DAILY. NEEDS OFFICE VISIT. 90 capsule 1  . conjugated estrogens-medroxyprogesteron (PREMPHASE) TABS Take 1 tablet by mouth daily.      . dabigatran (PRADAXA) 150 MG CAPS Take 1 capsule (150 mg total) by mouth every 12 (twelve) hours. 180 capsule 3  . hydrochlorothiazide (HYDRODIURIL) 25 MG tablet Take 12.5mg  every Monday and Thursday    . lisinopril (PRINIVIL,ZESTRIL) 10 MG  tablet Take 10 mg by mouth daily.      Marland Kitchen LORazepam (ATIVAN) 1 MG tablet Take 1 mg by mouth at bedtime.      . metoprolol (TOPROL-XL) 50 MG 24 hr tablet Take by mouth daily.     . pravastatin (PRAVACHOL) 40 MG tablet Take 1 tablet (40 mg total) by mouth daily as needed.    Marland Kitchen PREMPRO 0.625-5 MG per tablet   3  . VIIBRYD 20 MG TABS Take 1 tablet by mouth daily.     No current facility-administered medications for this visit.    REVIEW OF SYSTEMS:   Constitutional: Denies fevers, chills or abnormal night sweats Eyes: Denies blurriness of vision, double vision or watery eyes Ears, nose, mouth, throat, and face: Denies mucositis or sore throat Respiratory: Denies cough, dyspnea or  wheezes Cardiovascular: Denies palpitation, chest discomfort or lower extremity swelling Gastrointestinal:  Denies nausea, heartburn or change in bowel habits Skin: Denies abnormal skin rashes Lymphatics: Denies new lymphadenopathy or easy bruising Neurological:Denies numbness, tingling or new weaknesses Behavioral/Psych: Mood is stable, no new changes  All other systems were reviewed with the patient and are negative.  PHYSICAL EXAMINATION: ECOG PERFORMANCE STATUS: 0 - Asymptomatic  Filed Vitals:   12/15/14 1025  BP: 127/59  Pulse: 73  Temp: 97.7 F (36.5 C)  Resp: 17   Filed Weights   12/15/14 1025  Weight: 178 lb 14.4 oz (81.149 kg)    GENERAL:alert, no distress and comfortable SKIN: skin color, texture, turgor are normal, no rashes or significant lesions EYES: normal, conjunctiva are pink and non-injected, sclera clear OROPHARYNX:no exudate, no erythema and lips, buccal mucosa, and tongue normal  NECK: supple, thyroid normal size, non-tender, without nodularity LYMPH:  no palpable lymphadenopathy in the cervical, axillary or inguinal LUNGS: clear to auscultation and percussion with normal breathing effort HEART: regular rate & rhythm and no murmurs and no lower extremity edema ABDOMEN:abdomen soft, non-tender and normal bowel sounds Musculoskeletal:no cyanosis of digits and no clubbing  PSYCH: alert & oriented x 3 with fluent speech NEURO: no focal motor/sensory deficits  LABORATORY DATA:  I have reviewed the data as listed CBC Latest Ref Rng 12/15/2014 09/14/2014 08/16/2014  WBC 3.9 - 10.3 10e3/uL 13.5(H) 13.8(H) 13.7(H)  Hemoglobin 11.6 - 15.9 g/dL 13.0 13.9 13.4  Hematocrit 34.8 - 46.6 % 39.8 41.7 41.2  Platelets 145 - 400 10e3/uL 203 180 198    CMP Latest Ref Rng 09/14/2014 08/16/2014 04/07/2008  Glucose 70 - 140 mg/dl 131 104 98  BUN 7.0 - 26.0 mg/dL 13.8 12.4 20  Creatinine 0.6 - 1.1 mg/dL 1.0 0.9 0.84  Sodium 136 - 145 mEq/L 143 140 140  Potassium 3.5 - 5.1  mEq/L 4.0 4.0 3.5  Chloride - - - 106  CO2 22 - 29 mEq/L 24 22 25   Calcium 8.4 - 10.4 mg/dL 8.8 9.4 9.2  Total Protein 6.4 - 8.3 g/dL 6.6 6.5 -  Total Bilirubin 0.20 - 1.20 mg/dL 0.54 0.43 -  Alkaline Phos 40 - 150 U/L 46 45 -  AST 5 - 34 U/L 25 25 -  ALT 0 - 55 U/L 20 25 -     Flow cytometry 08/16/2014 Peripheral Blood Flow Cytometry - ABNORMAL B-CELL POPULATION DETECTED, SEE COMMENT. Diagnosis Comment: There is an abnormal population of B-cells with expression of B-cell markers including CD23. CD20 expression is dim and light chain expression is too dim for accurate assessment of clonality. There is no significant expression of CD5 or CD10. Markers for  hairy cell leukemia are negative. Review of the peripheral blood reveals a lymphocytosis with predominately small lymphocytes with coarse chromatin and scant cytoplasm. 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 The expression of CD23, dim CD20, dim light chains, and morphology are most suggestive of chronic lymphocytic leukemia, however, CD5 is negative which is rather uncommon. Correlation with cytogenetics may be of some utility.  RADIOGRAPHIC STUDIES: I have personally reviewed the radiological images as listed and agreed with the findings in the report.  CT chest, abdomen and pelvis on 09/06/2014 IMPRESSION: No evidence of mass, lymphadenopathy, or other acute findings.  Mild hepatic steatosis and colonic diverticulosis incidentally noted.  ASSESSMENT & PLAN:  74 year old Caucasian female with past medical history of brain aneurysm, hypertension, borderline diabetes, macular degeneration, hearing loss, who was incidentally found to have leukocytosis and routine lab since one year ago.  1. Chronic ascitic leukemia (CLL), stage 0  -I reviewed her flow cytometry results with her and her husband. This is most consistent with CLL. She only has mild lymphocytosis at 9.5K, no adenopathy or  splenomegaly by physical exam and CAT scan. No anemia and thrombocytopenia. -She feels well without any symptoms. I recommend observation at this stage. I explained to her she has very early stage disease, and it could be a very intercurrent course and she may not treatment for long time.  The treatment indication I usually symptomatic CLL, advanced stage no rapid lymphocytes doubling time. -FISH panel includes del(17p), del(11q), trisomy 12, and del(13q) , beta-2 microglobulin, ZAP70, CD 38, IGHV mutation status, was drawn today, results are pending.  -Her WBC IS 13.5, absolute lymphocyte count 9.2 today, no increased from 4 months ago -Follow-up her CBC and differential every 3 month   2. Hypertension and other comorbidity She will continue follow-up with her primary care physician Dr. Joylene Draft.  Plan -LAB CBC and diff very 3 month  -I will see her back in 6 month    All questions were answered. The patient knows to call the clinic with any problems, questions or concerns. I spent 20 minutes counseling the patient face to face. The total time spent in the appointment was 25 minutes and more than 50% was on counseling.     Truitt Merle, MD 12/15/2014 10:33 AM

## 2014-12-19 LAB — BETA 2 MICROGLOBULIN, SERUM: BETA 2 MICROGLOBULIN: 2.15 mg/L (ref <=2.51)

## 2014-12-20 LAB — ZAP-70

## 2014-12-22 LAB — FISH, PERIPHERAL BLOOD

## 2014-12-22 LAB — TISSUE HYBRIDIZATION TO NCBH

## 2015-02-24 ENCOUNTER — Encounter: Payer: Self-pay | Admitting: *Deleted

## 2015-02-27 ENCOUNTER — Ambulatory Visit (INDEPENDENT_AMBULATORY_CARE_PROVIDER_SITE_OTHER): Payer: Medicare Other | Admitting: Cardiovascular Disease

## 2015-02-27 ENCOUNTER — Encounter: Payer: Self-pay | Admitting: Cardiovascular Disease

## 2015-02-27 VITALS — BP 115/66 | HR 68 | Ht 65.0 in | Wt 177.1 lb

## 2015-02-27 DIAGNOSIS — I10 Essential (primary) hypertension: Secondary | ICD-10-CM | POA: Diagnosis not present

## 2015-02-27 DIAGNOSIS — Q2111 Secundum atrial septal defect: Secondary | ICD-10-CM

## 2015-02-27 DIAGNOSIS — I4891 Unspecified atrial fibrillation: Secondary | ICD-10-CM

## 2015-02-27 DIAGNOSIS — Q211 Atrial septal defect: Secondary | ICD-10-CM | POA: Diagnosis not present

## 2015-02-27 NOTE — Patient Instructions (Addendum)
Medication Instructions:  Your physician recommends that you continue on your current medications as directed. Please refer to the Current Medication list given to you today.  Labwork: No new orders.   Testing/Procedures: No new orders.   Follow-Up: Your physician wants you to follow-up in: 6 MONTHS with Dr Cooper.  You will receive a reminder letter in the mail two months in advance. If you don't receive a letter, please call our office to schedule the follow-up appointment.   Any Other Special Instructions Will Be Listed Below (If Applicable).   

## 2015-02-27 NOTE — Progress Notes (Signed)
Cardiology Office Note   Date:  02/27/2015   ID:  Rache, Klimaszewski 01/15/1941, MRN [id number]  PCP:  Jerlyn Ly, MD  Cardiologist:  Sherren Mocha, MD    Chief Complaint  Patient presents with  . Atrial Fibrillation   History of Present Illness: Bethany Oconnor is a 74 y.o. female who presents for follow-up evaluation. The patient has paroxysmal atrial fibrillation and a small ostium secundum ASD. She has been managed medically. She's chronically anticoagulated with Pradaxa, and she has no history of bleeding problems. Her most recent echocardiogram in 2014 demonstrated normal LV systolic function. There was no clear evidence of color-flow across the interatrial septum. There was no right ventricular enlargement.   She's doing ok from a cardiac perspective. She denies CP, dyspnea, edema, or heart palpitations. No lightheadedness. Main problems are related to vision loss and hard of hearing.  Past Medical History  Diagnosis Date  . Paroxysmal atrial fibrillation   . Cerebral aneurysm     s/p endovascular colling 2008  . Hearing loss   . Diabetes mellitus     borderline  . Hypertension     mild  . Macular degeneration   . Neuropathy   . Cervical cancer     Past Surgical History  Procedure Laterality Date  . Thyroidectomy    . Cervical cone biopsy    . Cataract extraction      bilateral    Current Outpatient Prescriptions  Medication Sig Dispense Refill  . beta carotene w/minerals (OCUVITE) tablet Take 1 tablet by mouth 2 (two) times daily.    . Calcium Carbonate-Vitamin D 600-400 MG-UNIT per tablet Take 1 tablet by mouth daily.    Marland Kitchen CARTIA XT 180 MG 24 hr capsule TAKE 1 CAPSULE BY MOUTH DAILY. NEEDS OFFICE VISIT. 90 capsule 1  . conjugated estrogens-medroxyprogesteron (PREMPHASE) TABS Take 1 tablet by mouth daily.      . dabigatran (PRADAXA) 150 MG CAPS Take 1 capsule (150 mg total) by mouth every 12 (twelve) hours. 180 capsule 3  . hydrochlorothiazide  (HYDRODIURIL) 25 MG tablet Take 12.5 mg by mouth 2 (two) times a week. Take 12.5mg  every Monday and Thursday    . lisinopril (PRINIVIL,ZESTRIL) 10 MG tablet Take 10 mg by mouth daily.      Marland Kitchen LORazepam (ATIVAN) 1 MG tablet Take 1 mg by mouth at bedtime.      . metoprolol (TOPROL-XL) 50 MG 24 hr tablet Take by mouth daily.     . pravastatin (PRAVACHOL) 40 MG tablet Take 1 tablet (40 mg total) by mouth daily as needed.    Marland Kitchen PREMPRO 0.625-5 MG per tablet Take 1 tablet by mouth daily.   3  . VIIBRYD 20 MG TABS Take 1 tablet by mouth daily.     No current facility-administered medications for this visit.   Allergies:   Codeine   Social History:  The patient  reports that she quit smoking about 26 years ago. She does not have any smokeless tobacco history on file. She reports that she does not use illicit drugs.   Family History:  The patient's  family history includes Cancer (age of onset: 44) in her father; Cerebral aneurysm (age of onset: 1) in her mother; Heart attack in her brother.    ROS:  Please see the history of present illness.  All other systems are reviewed and negative.    PHYSICAL EXAM: VS:  BP 115/66 mmHg  Pulse 68  Ht 5\' 5"  (1.651 m)  Wt 177 lb 1.9 oz (80.341 kg)  BMI 29.47 kg/m2 , BMI Body mass index is 29.47 kg/(m^2). GEN: Well nourished, well developed, in no acute distress HEENT: normal Neck: no JVD, no masses. No carotid bruits Cardiac: RRR without murmur or gallop                Respiratory:  clear to auscultation bilaterally, normal work of breathing GI: soft, nontender, nondistended, + BS MS: no deformity or atrophy Ext: no pretibial edema, pedal pulses 2+= bilaterally Skin: warm and dry, no rash Neuro:  Strength and sensation are intact Psych: euthymic mood, full affect  EKG:  EKG is ordered today. The ekg ordered today shows NSR 68 bpm, rightward axis, no changes from previous. No ST changes.  Recent Labs: 09/14/2014: ALT 20; BUN 13.8; Creatinine 1.0;  Potassium 4.0; Sodium 143 12/15/2014: HGB 13.0; Platelets 203   Lipid Panel  No results found for: CHOL, TRIG, HDL, CHOLHDL, VLDL, LDLCALC, LDLDIRECT    Wt Readings from Last 3 Encounters:  02/27/15 177 lb 1.9 oz (80.341 kg)  12/15/14 178 lb 14.4 oz (81.149 kg)  09/14/14 174 lb (78.926 kg)     ASSESSMENT AND PLAN: 1.  Paroxysmal atrial fibrillation: The patient is maintaining sinus rhythm. She appears to be stable from a cardiac perspective. She has no bleeding problems on Pradaxa. I will see her back in 6 months.  2. Essential hypertension: Blood pressure is well controlled on a combination of diltiazem, hydrochlorothiazide, lisinopril, and metoprolol succinate.  3. Ostium secundum ASD: No evidence of RV enlargement by echocardiogram. Continued observation is appropriate. The patient is maintained on chronic anticoagulation because of paroxysmal atrial fibrillation.   Current medicines are reviewed with the patient today.  The patient does not have concerns regarding medicines.  Labs/ tests ordered today include:   Orders Placed This Encounter  Procedures  . EKG 12-Lead    Disposition:   FU 6 months  Signed, Sherren Mocha, MD  02/27/2015 11:20 AM    [facility] [address], [place], [place]  [address] Phone: [phone]; Fax: [phone]

## 2015-03-09 ENCOUNTER — Other Ambulatory Visit (HOSPITAL_BASED_OUTPATIENT_CLINIC_OR_DEPARTMENT_OTHER): Payer: Medicare Other

## 2015-03-09 DIAGNOSIS — C9110 Chronic lymphocytic leukemia of B-cell type not having achieved remission: Secondary | ICD-10-CM

## 2015-03-09 DIAGNOSIS — D72820 Lymphocytosis (symptomatic): Secondary | ICD-10-CM

## 2015-03-09 LAB — CBC WITH DIFFERENTIAL/PLATELET
BASO%: 0.2 % (ref 0.0–2.0)
Basophils Absolute: 0 10*3/uL (ref 0.0–0.1)
EOS%: 1.2 % (ref 0.0–7.0)
Eosinophils Absolute: 0.2 10*3/uL (ref 0.0–0.5)
HCT: 41.1 % (ref 34.8–46.6)
HGB: 13.7 g/dL (ref 11.6–15.9)
LYMPH%: 68.3 % — ABNORMAL HIGH (ref 14.0–49.7)
MCH: 33.6 pg (ref 25.1–34.0)
MCHC: 33.4 g/dL (ref 31.5–36.0)
MCV: 100.4 fL (ref 79.5–101.0)
MONO#: 0.6 10*3/uL (ref 0.1–0.9)
MONO%: 4.3 % (ref 0.0–14.0)
NEUT#: 3.3 10*3/uL (ref 1.5–6.5)
NEUT%: 26 % — ABNORMAL LOW (ref 38.4–76.8)
Platelets: 180 10*3/uL (ref 145–400)
RBC: 4.09 10*6/uL (ref 3.70–5.45)
RDW: 13.3 % (ref 11.2–14.5)
WBC: 12.9 10*3/uL — ABNORMAL HIGH (ref 3.9–10.3)
lymph#: 8.8 10*3/uL — ABNORMAL HIGH (ref 0.9–3.3)

## 2015-03-09 LAB — TECHNOLOGIST REVIEW

## 2015-03-10 ENCOUNTER — Other Ambulatory Visit: Payer: Self-pay | Admitting: Cardiovascular Disease

## 2015-06-08 ENCOUNTER — Ambulatory Visit (HOSPITAL_BASED_OUTPATIENT_CLINIC_OR_DEPARTMENT_OTHER): Payer: Medicare Other | Admitting: Hematology

## 2015-06-08 ENCOUNTER — Telehealth: Payer: Self-pay | Admitting: Hematology

## 2015-06-08 ENCOUNTER — Encounter: Payer: Self-pay | Admitting: Hematology

## 2015-06-08 ENCOUNTER — Other Ambulatory Visit (HOSPITAL_BASED_OUTPATIENT_CLINIC_OR_DEPARTMENT_OTHER): Payer: Medicare Other

## 2015-06-08 VITALS — BP 123/57 | HR 77 | Temp 97.9°F | Resp 18 | Ht 65.0 in | Wt 176.3 lb

## 2015-06-08 DIAGNOSIS — C9110 Chronic lymphocytic leukemia of B-cell type not having achieved remission: Secondary | ICD-10-CM

## 2015-06-08 DIAGNOSIS — D72820 Lymphocytosis (symptomatic): Secondary | ICD-10-CM

## 2015-06-08 DIAGNOSIS — I10 Essential (primary) hypertension: Secondary | ICD-10-CM

## 2015-06-08 LAB — CBC WITH DIFFERENTIAL/PLATELET
BASO%: 0.2 % (ref 0.0–2.0)
Basophils Absolute: 0 10*3/uL (ref 0.0–0.1)
EOS%: 1 % (ref 0.0–7.0)
Eosinophils Absolute: 0.1 10*3/uL (ref 0.0–0.5)
HCT: 41 % (ref 34.8–46.6)
HEMOGLOBIN: 13.6 g/dL (ref 11.6–15.9)
LYMPH#: 9.1 10*3/uL — AB (ref 0.9–3.3)
LYMPH%: 71.1 % — ABNORMAL HIGH (ref 14.0–49.7)
MCH: 33.6 pg (ref 25.1–34.0)
MCHC: 33.2 g/dL (ref 31.5–36.0)
MCV: 101.2 fL — ABNORMAL HIGH (ref 79.5–101.0)
MONO#: 0.6 10*3/uL (ref 0.1–0.9)
MONO%: 5 % (ref 0.0–14.0)
NEUT%: 22.7 % — ABNORMAL LOW (ref 38.4–76.8)
NEUTROS ABS: 2.9 10*3/uL (ref 1.5–6.5)
PLATELETS: 168 10*3/uL (ref 145–400)
RBC: 4.05 10*6/uL (ref 3.70–5.45)
RDW: 14 % (ref 11.2–14.5)
WBC: 12.8 10*3/uL — ABNORMAL HIGH (ref 3.9–10.3)

## 2015-06-08 NOTE — Telephone Encounter (Signed)
Gave patient avs report and appointments for December 2016 and April 2017. Per patient three mth lab scheduled for late December due to she will be in [place] in January.

## 2015-06-08 NOTE — Progress Notes (Signed)
[place]  Telephone:([phone]) [phone] [phone]  Clinic Follow up Note   Patient Care Team: Crist Infante, MD as PCP - General (Internal Medicine) Crist Infante, MD (Internal Medicine) Newton Pigg, MD as Consulting Physician (Obstetrics and Gynecology) 06/08/2015  CHIEF COMPLAINTS Follow-up CLL, stage 0   HISTORY OF INITIAL PRESENTING ILLNESS:  Bethany Oconnor 74 y.o. female is here because of her asymptomatic lymphocytosis which was found on routine blood test.  Per patient, her white count was slightly elevated 1 year ago on the routine lab test. He had repeated lab test on 07/22/2014 by her primary care physician Dr. Joylene Draft. It showed WBC 13.3 K, absolute lymphocyte count 9.4K, hemoglobin 12.4, and platelet count 270 8K. Her TSH was normal. She was then referred to our cancer center for further evaluation.  She feels well in general, has no particular complaints. She denies any fever or chill, no night sweats, no weight loss in the past 5 years. She has macular degeneration, and she cannot see since very well. She denies any particular pain, cough, dyspnea, GI or other symptoms.  INTERIM HISTORY Bethany Oconnor returns for follow-up. She is accompanied to the clinic by her husband. She is doing very well, denies any new symptoms. She has good appetite and eating well. She has occasional night sweats, which is chronic and unchanged. No fever or chills. No recent weight loss. She and her husband plans to go to [place] for the winter from December to March.   MEDICAL HISTORY:  Past Medical History  Diagnosis Date  . Paroxysmal atrial fibrillation (HCC)   . Cerebral aneurysm     s/p endovascular colling 2008  . Hearing loss   . Diabetes mellitus     borderline  . Hypertension     mild  . Macular degeneration   . Neuropathy ([place])   . Cervical cancer [facility])     SURGICAL HISTORY: Past Surgical History  Procedure Laterality Date  . Thyroidectomy    . Cervical  cone biopsy    . Cataract extraction      bilateral    SOCIAL HISTORY: Social History   Social History  . Marital Status: Married    Spouse Name: N/A  . Number of Children: 2  . Years of Education: N/A   Occupational History  .     Social History Main Topics  . Smoking status: Former Smoker    Quit date: 09/02/1988  . Smokeless tobacco: Not on file  . Alcohol Use: Not on file     Comment: daily for 50 years   . Drug Use: No  . Sexual Activity: Not on file   Other Topics Concern  . Not on file   Social History Narrative    FAMILY HISTORY: Family History  Problem Relation Age of Onset  . Cerebral aneurysm Mother 33    died  . Cancer Father 80    died  . Heart attack Brother     ALLERGIES:  is allergic to codeine.  MEDICATIONS:  Current Outpatient Prescriptions  Medication Sig Dispense Refill  . beta carotene w/minerals (OCUVITE) tablet Take 1 tablet by mouth 2 (two) times daily.    . Calcium Carbonate-Vitamin D 600-400 MG-UNIT per tablet Take 1 tablet by mouth daily.    Marland Kitchen CARTIA XT 180 MG 24 hr capsule TAKE 1 CAPSULE BY MOUTH DAILY 90 capsule 3  . dabigatran (PRADAXA) 150 MG CAPS Take 1 capsule (150 mg total) by mouth every 12 (twelve) hours. [place]  capsule 3  . hydrochlorothiazide (HYDRODIURIL) 25 MG tablet Take 12.5 mg by mouth 2 (two) times a week. Take 12.5mg  every Monday and Thursday    . lisinopril (PRINIVIL,ZESTRIL) 10 MG tablet Take 10 mg by mouth daily.      Marland Kitchen LORazepam (ATIVAN) 1 MG tablet Take 1 mg by mouth at bedtime.      . metoprolol (TOPROL-XL) 50 MG 24 hr tablet Take by mouth daily.     . pravastatin (PRAVACHOL) 40 MG tablet Take 1 tablet (40 mg total) by mouth daily as needed.    Marland Kitchen PREMPRO 0.625-5 MG per tablet Take 1 tablet by mouth daily.   3  . VIIBRYD 20 MG TABS Take 1 tablet by mouth daily.     No current facility-administered medications for this visit.    REVIEW OF SYSTEMS:   Constitutional: Denies fevers, chills or abnormal night  sweats Eyes: Denies blurriness of vision, double vision or watery eyes Ears, nose, mouth, throat, and face: Denies mucositis or sore throat Respiratory: Denies cough, dyspnea or wheezes Cardiovascular: Denies palpitation, chest discomfort or lower extremity swelling Gastrointestinal:  Denies nausea, heartburn or change in bowel habits Skin: Denies abnormal skin rashes Lymphatics: Denies new lymphadenopathy or easy bruising Neurological:Denies numbness, tingling or new weaknesses Behavioral/Psych: Mood is stable, no new changes  All other systems were reviewed with the patient and are negative.  PHYSICAL EXAMINATION: ECOG PERFORMANCE STATUS: 0 - Asymptomatic  Filed Vitals:   06/08/15 1055  BP: 123/57  Pulse: 77  Temp: 97.9 F (36.6 C)  Resp: 18   Filed Weights   06/08/15 1055  Weight: 176 lb 4.8 oz (79.969 kg)    GENERAL:alert, no distress and comfortable SKIN: skin color, texture, turgor are normal, no rashes or significant lesions EYES: normal, conjunctiva are pink and non-injected, sclera clear OROPHARYNX:no exudate, no erythema and lips, buccal mucosa, and tongue normal  NECK: supple, thyroid normal size, non-tender, without nodularity LYMPH:  no palpable lymphadenopathy in the cervical, axillary or inguinal LUNGS: clear to auscultation and percussion with normal breathing effort HEART: regular rate & rhythm and no murmurs and no lower extremity edema ABDOMEN:abdomen soft, non-tender and normal bowel sounds Musculoskeletal:no cyanosis of digits and no clubbing  PSYCH: alert & oriented x 3 with fluent speech NEURO: no focal motor/sensory deficits  LABORATORY DATA:  I have reviewed the data as listed CBC Latest Ref Rng 06/08/2015 03/09/2015 12/15/2014  WBC 3.9 - 10.3 10e3/uL 12.8(H) 12.9(H) 13.5(H)  Hemoglobin 11.6 - 15.9 g/dL 13.6 13.7 13.0  Hematocrit 34.8 - 46.6 % 41.0 41.1 39.8  Platelets 145 - 400 10e3/uL 168 180 203    CMP Latest Ref Rng 09/14/2014 08/16/2014  04/07/2008  Glucose 70 - 140 mg/dl 131 104 98  BUN 7.0 - 26.0 mg/dL 13.8 12.4 20  Creatinine 0.6 - 1.1 mg/dL 1.0 0.9 0.84  Sodium 136 - 145 mEq/L 143 140 140  Potassium 3.5 - 5.1 mEq/L 4.0 4.0 3.5  Chloride - - - 106  CO2 22 - 29 mEq/L 24 22 25   Calcium 8.4 - 10.4 mg/dL 8.8 9.4 9.2  Total Protein 6.4 - 8.3 g/dL 6.6 6.5 -  Total Bilirubin 0.20 - 1.20 mg/dL 0.54 0.43 -  Alkaline Phos 40 - 150 U/L 46 45 -  AST 5 - 34 U/L 25 25 -  ALT 0 - 55 U/L 20 25 -   Her lymph# is 9.1K today  Flow cytometry 08/16/2014 Peripheral Blood Flow Cytometry - ABNORMAL B-CELL POPULATION DETECTED, SEE COMMENT.  Diagnosis Comment: There is an abnormal population of B-cells with expression of B-cell markers including CD23. CD20 expression is dim and light chain expression is too dim for accurate assessment of clonality. There is no significant expression of CD5 or CD10. Markers for hairy cell leukemia are negative. Review of the peripheral blood reveals a lymphocytosis with predominately small lymphocytes with coarse chromatin and scant cytoplasm. 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 The expression of CD23, dim CD20, dim light chains, and morphology are most suggestive of chronic lymphocytic leukemia, however, CD5 is negative which is rather uncommon. Correlation with cytogenetics may be of some utility.  RADIOGRAPHIC STUDIES: I have personally reviewed the radiological images as listed and agreed with the findings in the report.  CT chest, abdomen and pelvis on 09/06/2014 IMPRESSION: No evidence of mass, lymphadenopathy, or other acute findings.  Mild hepatic steatosis and colonic diverticulosis incidentally noted.  ASSESSMENT & PLAN:  74 year old Caucasian female with past medical history of brain aneurysm, hypertension, borderline diabetes, macular degeneration, hearing loss, who was incidentally found to have leukocytosis on routine lab since 2015.  1.  Chronic ascitic leukemia (CLL), stage 0  -I reviewed her flow cytometry results with her and her husband. This is most consistent with CLL. She only has mild lymphocytosis at 9.5K, no adenopathy or splenomegaly by physical exam and CAT scan. No anemia and thrombocytopenia. -She feels well without any symptoms. I recommend observation at this stage. I explained to her she has very early stage disease, and it could be a very intercurrent course and she may not treatment for long time.  The treatment indications include symptomatic CLL, advanced stage or rapid lymphocytes doubling time. -FISH panel includes del(17p), del(11q), trisomy 12, and del(13q) , beta-2 microglobulin normal, ZAP70 (-)  -Her WBC IS 12.8, absolute lymphocyte count 9.1 today, very stable in the past 9 months -Follow-up her CBC and differential every 3 month   2. Hypertension and other comorbidity She will continue follow-up with her primary care physician Dr. Joylene Draft.  Plan -LAB CBC and diff very 3 month  -I will see her back in 6 month, if counts remain stable, will change her CBC every 6 month  -She will have flu shot at her nursing home.   All questions were answered. The patient knows to call the clinic with any problems, questions or concerns. I spent 15 minutes counseling the patient face to face. The total time spent in the appointment was 20 minutes and more than 50% was on counseling.     Truitt Merle, MD 06/08/2015 11:24 AM

## 2015-08-08 ENCOUNTER — Encounter: Payer: Self-pay | Admitting: Cardiovascular Disease

## 2015-08-08 ENCOUNTER — Ambulatory Visit (INDEPENDENT_AMBULATORY_CARE_PROVIDER_SITE_OTHER): Payer: Medicare Other | Admitting: Cardiovascular Disease

## 2015-08-08 VITALS — BP 118/80 | HR 71 | Ht 65.0 in | Wt 178.0 lb

## 2015-08-08 DIAGNOSIS — Q211 Atrial septal defect: Secondary | ICD-10-CM | POA: Diagnosis not present

## 2015-08-08 DIAGNOSIS — I10 Essential (primary) hypertension: Secondary | ICD-10-CM

## 2015-08-08 DIAGNOSIS — I4891 Unspecified atrial fibrillation: Secondary | ICD-10-CM

## 2015-08-08 DIAGNOSIS — Q2111 Secundum atrial septal defect: Secondary | ICD-10-CM

## 2015-08-08 NOTE — Progress Notes (Signed)
Cardiology Office Note Date:  08/08/2015   ID:  Bethany Oconnor, DOB 01/14/1941, MRN [id number]  PCP:  Jerlyn Ly, MD  Cardiologist:  Sherren Mocha, MD    Chief Complaint  Patient presents with  . Follow-up    6 month  . Atrial Fibrillation     History of Present Illness: Bethany Oconnor is a 73 y.o. female who presents for follow-up evaluation. The patient has paroxysmal atrial fibrillation and a small ostium secundum ASD. She has been managed medically. She's chronically anticoagulated with Pradaxa, and she has no history of bleeding problems. Her most recent echocardiogram in 2014 demonstrated normal LV systolic function. There was no clear evidence of color-flow across the interatrial septum. There was no right ventricular enlargement.   The patient has done well from a cardiac perspective over recent years. She has become increasingly sedentary with progressive vision problems. She has not had any specific cardiac symptoms.Today, she denies symptoms of palpitations, chest pain, shortness of breath, orthopnea, PND, lower extremity edema, dizziness, or syncope. She's here with her husband today. They will leave for [place] after the first of the year and plan to spend the Spring months there.    Past Medical History  Diagnosis Date  . Paroxysmal atrial fibrillation (HCC)   . Cerebral aneurysm     s/p endovascular colling 2008  . Hearing loss   . Diabetes mellitus     borderline  . Hypertension     mild  . Macular degeneration   . Neuropathy ([place])   . Cervical cancer [facility])     Past Surgical History  Procedure Laterality Date  . Thyroidectomy    . Cervical cone biopsy    . Cataract extraction      bilateral    Current Outpatient Prescriptions  Medication Sig Dispense Refill  . beta carotene w/minerals (OCUVITE) tablet Take 1 tablet by mouth 2 (two) times daily.    . Calcium Carbonate-Vitamin D 600-400 MG-UNIT per tablet Take 1 tablet by mouth daily.    Marland Kitchen  CARTIA XT 180 MG 24 hr capsule TAKE 1 CAPSULE BY MOUTH DAILY 90 capsule 3  . dabigatran (PRADAXA) 150 MG CAPS Take 1 capsule (150 mg total) by mouth every 12 (twelve) hours. 180 capsule 3  . hydrochlorothiazide (HYDRODIURIL) 25 MG tablet Take 12.5 mg by mouth 2 (two) times a week. Take 12.5mg  every Monday and Thursday    . lisinopril (PRINIVIL,ZESTRIL) 10 MG tablet Take 10 mg by mouth daily.      Marland Kitchen LORazepam (ATIVAN) 1 MG tablet Take 1 mg by mouth at bedtime.      . metoprolol (TOPROL-XL) 50 MG 24 hr tablet Take by mouth daily.     . pravastatin (PRAVACHOL) 40 MG tablet Take 1 tablet (40 mg total) by mouth daily as needed.    Marland Kitchen PREMPRO 0.625-5 MG per tablet Take 1 tablet by mouth daily.   3  . VIIBRYD 20 MG TABS Take 1 tablet by mouth daily.     No current facility-administered medications for this visit.    Allergies:   Codeine   Social History:  The patient  reports that she quit smoking about 26 years ago. She does not have any smokeless tobacco history on file. She reports that she does not use illicit drugs.   Family History:  The patient's  family history includes Cancer (age of onset: 49) in her father; Cerebral aneurysm (age of onset: 24) in her mother; Heart attack in her brother.  ROS:  Please see the history of present illness.   All other systems are reviewed and negative.   PHYSICAL EXAM: VS:  BP 118/80 mmHg  Pulse 71  Ht 5\' 5"  (1.651 m)  Wt 178 lb (80.74 kg)  BMI 29.62 kg/m2 , BMI Body mass index is 29.62 kg/(m^2). GEN: Well nourished, well developed, pleasant elderly woman in no acute distress HEENT: normal Neck: no JVD, no masses. No carotid bruits Cardiac: RRR without murmur or gallop                Respiratory:  clear to auscultation bilaterally, normal work of breathing GI: soft, nontender, nondistended, + BS MS: no deformity or atrophy Ext: no pretibial edema, pedal pulses 2+= bilaterally Skin: warm and dry, no rash Neuro:  Strength and sensation are  intact Psych: euthymic mood, full affect  EKG:  EKG is ordered today. The ekg ordered today shows normal sinus rhythm 71 bpm, within normal limits.  Recent Labs: 09/14/2014: ALT 20; BUN 13.8; Creatinine 1.0; Potassium 4.0; Sodium 143 06/08/2015: HGB 13.6; Platelets 168   Lipid Panel  No results found for: CHOL, TRIG, HDL, CHOLHDL, VLDL, LDLCALC, LDLDIRECT    Wt Readings from Last 3 Encounters:  08/08/15 178 lb (80.74 kg)  06/08/15 176 lb 4.8 oz (79.969 kg)  02/27/15 177 lb 1.9 oz (80.341 kg)    ASSESSMENT AND PLAN: 1. Paroxysmal atrial fibrillation: The patient is maintaining sinus rhythm. She appears to be stable from a cardiac perspective. She has no bleeding problems on Pradaxa. I will see her back in 6 months.  2. Essential hypertension: well-controlled. meds reviewed - no changes today.  3. Ostium secundum ASD: small defect with no evidence of significant L-R shunt (normal RV size).   Current medicines are reviewed with the patient today.  The patient does not have concerns regarding medicines.  Labs/ tests ordered today include:  No orders of the defined types were placed in this encounter.    Disposition:   FU 6 months  Signed, Sherren Mocha, MD  08/08/2015 10:12 AM    [facility] [address], [place], [place]  [address] Phone: [phone]; Fax: [phone]

## 2015-08-08 NOTE — Patient Instructions (Signed)

## 2015-08-10 ENCOUNTER — Other Ambulatory Visit: Payer: Self-pay | Admitting: Hematology

## 2015-08-11 ENCOUNTER — Telehealth: Payer: Self-pay | Admitting: Hematology

## 2015-08-11 ENCOUNTER — Telehealth: Payer: Self-pay | Admitting: *Deleted

## 2015-08-11 NOTE — Telephone Encounter (Signed)
per pof to sch fera-cld & spoke to pt and gave appt times for fera 12/13 & 12/20

## 2015-08-11 NOTE — Telephone Encounter (Signed)
per pof to sch pt rmt-sent MW email to sch trmt-willc all pt after reply

## 2015-08-11 NOTE — Telephone Encounter (Signed)
Per staff message and POF I have scheduled appts. Advised scheduler of appts. JMW  

## 2015-08-14 ENCOUNTER — Other Ambulatory Visit: Payer: Self-pay | Admitting: *Deleted

## 2015-08-15 ENCOUNTER — Ambulatory Visit: Payer: Medicare Other

## 2015-08-22 ENCOUNTER — Ambulatory Visit: Payer: Medicare Other

## 2015-08-29 ENCOUNTER — Other Ambulatory Visit: Payer: Self-pay | Admitting: *Deleted

## 2015-08-29 DIAGNOSIS — C9110 Chronic lymphocytic leukemia of B-cell type not having achieved remission: Secondary | ICD-10-CM

## 2015-08-30 ENCOUNTER — Other Ambulatory Visit (HOSPITAL_BASED_OUTPATIENT_CLINIC_OR_DEPARTMENT_OTHER): Payer: Medicare Other

## 2015-08-30 DIAGNOSIS — C9110 Chronic lymphocytic leukemia of B-cell type not having achieved remission: Secondary | ICD-10-CM

## 2015-08-30 LAB — CBC WITH DIFFERENTIAL/PLATELET
BASO%: 0.3 % (ref 0.0–2.0)
Basophils Absolute: 0 10*3/uL (ref 0.0–0.1)
EOS ABS: 0.2 10*3/uL (ref 0.0–0.5)
EOS%: 0.9 % (ref 0.0–7.0)
HCT: 40.8 % (ref 34.8–46.6)
HEMOGLOBIN: 13.5 g/dL (ref 11.6–15.9)
LYMPH%: 72.3 % — ABNORMAL HIGH (ref 14.0–49.7)
MCH: 33.3 pg (ref 25.1–34.0)
MCHC: 33.1 g/dL (ref 31.5–36.0)
MCV: 100.5 fL (ref 79.5–101.0)
MONO#: 0.8 10*3/uL (ref 0.1–0.9)
MONO%: 4.9 % (ref 0.0–14.0)
NEUT%: 21.6 % — ABNORMAL LOW (ref 38.4–76.8)
NEUTROS ABS: 3.5 10*3/uL (ref 1.5–6.5)
PLATELETS: 172 10*3/uL (ref 145–400)
RBC: 4.06 10*6/uL (ref 3.70–5.45)
RDW: 13.5 % (ref 11.2–14.5)
WBC: 16.1 10*3/uL — AB (ref 3.9–10.3)
lymph#: 11.6 10*3/uL — ABNORMAL HIGH (ref 0.9–3.3)

## 2015-08-30 LAB — COMPREHENSIVE METABOLIC PANEL
ALK PHOS: 46 U/L (ref 40–150)
ALT: 21 U/L (ref 0–55)
ANION GAP: 9 meq/L (ref 3–11)
AST: 24 U/L (ref 5–34)
Albumin: 3.8 g/dL (ref 3.5–5.0)
BUN: 13.7 mg/dL (ref 7.0–26.0)
CO2: 24 mEq/L (ref 22–29)
CREATININE: 1 mg/dL (ref 0.6–1.1)
Calcium: 9.4 mg/dL (ref 8.4–10.4)
Chloride: 108 mEq/L (ref 98–109)
EGFR: 57 mL/min/{1.73_m2} — AB (ref >90)
Glucose: 102 mg/dl (ref 70–140)
Potassium: 4.5 mEq/L (ref 3.5–5.1)
Sodium: 141 mEq/L (ref 136–145)
TOTAL PROTEIN: 6.7 g/dL (ref 6.4–8.3)
Total Bilirubin: 0.46 mg/dL (ref 0.20–1.20)

## 2015-08-31 ENCOUNTER — Telehealth: Payer: Self-pay | Admitting: *Deleted

## 2015-08-31 ENCOUNTER — Telehealth: Payer: Self-pay | Admitting: Hematology

## 2015-08-31 DIAGNOSIS — C9110 Chronic lymphocytic leukemia of B-cell type not having achieved remission: Secondary | ICD-10-CM

## 2015-08-31 NOTE — Telephone Encounter (Signed)
Bethany Oconnor, please give her a lab order (CBC with diff) and ask her to have it done in [place] in 2 months, and fax the result to [place]. She does not need to repeat lab before she leaves (too close). If she could not find a please to have lab done in [facility], [place] to have it done here when she returns in 3 months. If she develops any symptoms during her stay in [place] (fever, fatigue or dyspnea etc) please go to local ED.   Thanks.   Truitt Merle  08/31/2015

## 2015-08-31 NOTE — Telephone Encounter (Signed)
-----   Message from Truitt Merle, MD sent at 08/30/2015  6:26 PM EST ----- Bethany Oconnor, please call pt and let her know that her lymph# is slightly higher than before, and I would like her to have a repeated CBC+DIFF in 2 month, please send a POF. I will still see her in April as it's scheduled.  Thanks,  Truitt Merle  08/30/2015

## 2015-08-31 NOTE — Telephone Encounter (Signed)
Lvm advising appt 10/31/15 @ 2.15pm.

## 2015-08-31 NOTE — Telephone Encounter (Signed)
Pt returned call & informed of lymph # being slightly elevated & need to repeat CBC/Diff in 2 mo.  Pt reports they had planned to go to [facility] for @ 3 mo, leaving Jan 17 & possible return in March.  She can change her plans if need be but wants to know if she can recheck before going to [facility] or she can come back early but needs to know so she can change her plans. Informed would discuss with Dr Burr Medico & get back with her.

## 2015-08-31 NOTE — Telephone Encounter (Deleted)
-----   Message from Truitt Merle, MD sent at 08/30/2015  6:26 PM EST ----- Janifer Adie, please call pt and let her know that her lymph# is slightly higher than before, and I would like her to have a repeated CBC+DIFF in 2 month, please send a POF. I will still see her in April as it's scheduled.  Thanks,  Truitt Merle  08/30/2015

## 2015-08-31 NOTE — Telephone Encounter (Signed)
Left message on pt's mobile & home # to call back for a message from Dr Burr Medico.

## 2015-09-01 NOTE — Telephone Encounter (Signed)
Left message on pt's mobile #  to call back to discuss labs.

## 2015-09-01 NOTE — Telephone Encounter (Signed)
Called & spoke with pt's husband & given instructions for labs per Dr Burr Medico.  They will pick up a script for labs next week.  CBC/Diff should be done in @ 2 mo.  They do have a PCP in [place] that can help with labs.

## 2015-10-25 ENCOUNTER — Telehealth: Payer: Self-pay | Admitting: Hematology

## 2015-10-25 NOTE — Telephone Encounter (Signed)
I called patient and left a detailed message, regarding her CBC from [facility] on The 20th 2017. Her total WBC was 17.8K, and absolute lymphocytes was 13.8K (was 11.6 3 month ago), slightly increased, will continue monitoring. I will cancel her 2/28 lab appointment with [place], and I encourage her to keep her lab and MD follow up appointments in April 2017.  Truitt Merle  10/25/2015

## 2015-10-27 ENCOUNTER — Other Ambulatory Visit: Payer: Self-pay | Admitting: *Deleted

## 2015-10-31 ENCOUNTER — Other Ambulatory Visit: Payer: Medicare Other

## 2015-12-07 ENCOUNTER — Telehealth: Payer: Self-pay | Admitting: Hematology

## 2015-12-07 ENCOUNTER — Encounter: Payer: Self-pay | Admitting: Hematology

## 2015-12-07 ENCOUNTER — Ambulatory Visit (HOSPITAL_BASED_OUTPATIENT_CLINIC_OR_DEPARTMENT_OTHER): Payer: Medicare Other | Admitting: Hematology

## 2015-12-07 ENCOUNTER — Other Ambulatory Visit (HOSPITAL_BASED_OUTPATIENT_CLINIC_OR_DEPARTMENT_OTHER): Payer: Medicare Other

## 2015-12-07 VITALS — BP 134/64 | HR 81 | Temp 97.8°F | Resp 18 | Ht 65.0 in | Wt 179.6 lb

## 2015-12-07 DIAGNOSIS — I10 Essential (primary) hypertension: Secondary | ICD-10-CM

## 2015-12-07 DIAGNOSIS — C9110 Chronic lymphocytic leukemia of B-cell type not having achieved remission: Secondary | ICD-10-CM

## 2015-12-07 LAB — COMPREHENSIVE METABOLIC PANEL
ALBUMIN: 3.9 g/dL (ref 3.5–5.0)
ALT: 24 U/L (ref 0–55)
AST: 29 U/L (ref 5–34)
Alkaline Phosphatase: 42 U/L (ref 40–150)
Anion Gap: 8 mEq/L (ref 3–11)
BUN: 15.1 mg/dL (ref 7.0–26.0)
CALCIUM: 9.8 mg/dL (ref 8.4–10.4)
CO2: 24 mEq/L (ref 22–29)
Chloride: 109 mEq/L (ref 98–109)
Creatinine: 0.9 mg/dL (ref 0.6–1.1)
EGFR: 62 mL/min/{1.73_m2} — AB (ref >90)
Glucose: 87 mg/dl (ref 70–140)
POTASSIUM: 4.3 meq/L (ref 3.5–5.1)
Sodium: 141 mEq/L (ref 136–145)
Total Bilirubin: 0.45 mg/dL (ref 0.20–1.20)
Total Protein: 6.9 g/dL (ref 6.4–8.3)

## 2015-12-07 LAB — CBC WITH DIFFERENTIAL/PLATELET
BASO%: 0.3 % (ref 0.0–2.0)
BASOS ABS: 0 10*3/uL (ref 0.0–0.1)
EOS%: 0.7 % (ref 0.0–7.0)
Eosinophils Absolute: 0.1 10*3/uL (ref 0.0–0.5)
HEMATOCRIT: 41.4 % (ref 34.8–46.6)
HEMOGLOBIN: 13.7 g/dL (ref 11.6–15.9)
LYMPH#: 12 10*3/uL — AB (ref 0.9–3.3)
LYMPH%: 71.9 % — ABNORMAL HIGH (ref 14.0–49.7)
MCH: 33.3 pg (ref 25.1–34.0)
MCHC: 33 g/dL (ref 31.5–36.0)
MCV: 100.9 fL (ref 79.5–101.0)
MONO#: 0.8 10*3/uL (ref 0.1–0.9)
MONO%: 4.9 % (ref 0.0–14.0)
NEUT#: 3.7 10*3/uL (ref 1.5–6.5)
NEUT%: 22.2 % — AB (ref 38.4–76.8)
Platelets: 169 10*3/uL (ref 145–400)
RBC: 4.11 10*6/uL (ref 3.70–5.45)
RDW: 13.6 % (ref 11.2–14.5)
WBC: 16.6 10*3/uL — ABNORMAL HIGH (ref 3.9–10.3)

## 2015-12-07 LAB — TECHNOLOGIST REVIEW

## 2015-12-07 NOTE — Progress Notes (Signed)
[facility]  [phone] [phone]  Clinic Follow up Note   Patient Care Team: Crist Infante, MD as PCP - General (Internal Medicine) Crist Infante, MD (Internal Medicine) Newton Pigg, MD as Consulting Physician (Obstetrics and Gynecology) 12/07/2015  CHIEF COMPLAINTS Follow-up CLL, stage 0   HISTORY OF INITIAL PRESENTING ILLNESS (08/16/2014):  Bethany Oconnor 75 y.o. female is here because of her asymptomatic lymphocytosis which was found on routine blood test.  Per patient, her white count was slightly elevated 1 year ago on the routine lab test. He had repeated lab test on 07/22/2014 by her primary care physician Dr. Joylene Draft. It showed WBC 13.3 K, absolute lymphocyte count 9.4K, hemoglobin 12.4, and platelet count 270 8K. Her TSH was normal. She was then referred to our cancer center for further evaluation.  She feels well in general, has no particular complaints. She denies any fever or chill, no night sweats, no weight loss in the past 5 years. She has macular degeneration, and she cannot see since very well. She denies any particular pain, cough, dyspnea, GI or other symptoms.  CURRENT THERAPY: Observation  INTERIM HISTORY Bethany Oconnor returns for follow-up. She is accompanied to the clinic by her husband. She is doing well overall, denies any pain, fever, chill, night sweats, or other symptoms. Weight has been stable. She feels well overall, has mild fatigue, no other symptoms.  MEDICAL HISTORY:  Past Medical History  Diagnosis Date  . Paroxysmal atrial fibrillation (HCC)   . Cerebral aneurysm     s/p endovascular colling 2008  . Hearing loss   . Diabetes mellitus     borderline  . Hypertension     mild  . Macular degeneration   . Neuropathy ([place])   . Cervical cancer [facility])     SURGICAL HISTORY: Past Surgical History  Procedure Laterality Date  . Thyroidectomy    . Cervical cone biopsy    . Cataract extraction      bilateral     SOCIAL HISTORY: Social History   Social History  . Marital Status: Married    Spouse Name: N/A  . Number of Children: 2  . Years of Education: N/A   Occupational History  .     Social History Main Topics  . Smoking status: Former Smoker    Quit date: 09/02/1988  . Smokeless tobacco: Not on file  . Alcohol Use: Not on file     Comment: daily for 50 years   . Drug Use: No  . Sexual Activity: Not on file   Other Topics Concern  . Not on file   Social History Narrative    FAMILY HISTORY: Family History  Problem Relation Age of Onset  . Cerebral aneurysm Mother 46    died  . Cancer Father 25    died  . Heart attack Brother     ALLERGIES:  is allergic to codeine.  MEDICATIONS:  Current Outpatient Prescriptions  Medication Sig Dispense Refill  . beta carotene w/minerals (OCUVITE) tablet Take 1 tablet by mouth 2 (two) times daily.    . Calcium Carbonate-Vitamin D 600-400 MG-UNIT per tablet Take 1 tablet by mouth daily.    Marland Kitchen CARTIA XT 180 MG 24 hr capsule TAKE 1 CAPSULE BY MOUTH DAILY 90 capsule 3  . dabigatran (PRADAXA) 150 MG CAPS Take 1 capsule (150 mg total) by mouth every 12 (twelve) hours. 180 capsule 3  . hydrochlorothiazide (HYDRODIURIL) 25 MG tablet Take 12.5 mg by mouth 2 (two) times  a week. Take 12.5mg  every Monday and Thursday    . lisinopril (PRINIVIL,ZESTRIL) 10 MG tablet Take 10 mg by mouth daily.      Marland Kitchen LORazepam (ATIVAN) 1 MG tablet Take 1 mg by mouth at bedtime.      . metoprolol (TOPROL-XL) 50 MG 24 hr tablet Take by mouth daily.     . pravastatin (PRAVACHOL) 40 MG tablet Take 1 tablet (40 mg total) by mouth daily as needed.    Marland Kitchen PREMPRO 0.625-5 MG per tablet Take 1 tablet by mouth daily.   3  . VIIBRYD 20 MG TABS Take 1 tablet by mouth daily.     No current facility-administered medications for this visit.    REVIEW OF SYSTEMS:   Constitutional: Denies fevers, chills or abnormal night sweats Eyes: Denies blurriness of vision, double vision  or watery eyes Ears, nose, mouth, throat, and face: Denies mucositis or sore throat Respiratory: Denies cough, dyspnea or wheezes Cardiovascular: Denies palpitation, chest discomfort or lower extremity swelling Gastrointestinal:  Denies nausea, heartburn or change in bowel habits Skin: Denies abnormal skin rashes Lymphatics: Denies new lymphadenopathy or easy bruising Neurological:Denies numbness, tingling or new weaknesses Behavioral/Psych: Mood is stable, no new changes  All other systems were reviewed with the patient and are negative.  PHYSICAL EXAMINATION: ECOG PERFORMANCE STATUS: 0 - Asymptomatic  Filed Vitals:   12/07/15 1302  BP: 134/64  Pulse: 81  Temp: 97.8 F (36.6 C)  Resp: 18   Filed Weights   12/07/15 1302  Weight: 179 lb 9.6 oz (81.466 kg)    GENERAL:alert, no distress and comfortable SKIN: skin color, texture, turgor are normal, no rashes or significant lesions EYES: normal, conjunctiva are pink and non-injected, sclera clear OROPHARYNX:no exudate, no erythema and lips, buccal mucosa, and tongue normal  NECK: supple, thyroid normal size, non-tender, without nodularity LYMPH:  no palpable lymphadenopathy in the cervical, axillary or inguinal LUNGS: clear to auscultation and percussion with normal breathing effort HEART: regular rate & rhythm and no murmurs and no lower extremity edema ABDOMEN:abdomen soft, non-tender and normal bowel sounds Musculoskeletal:no cyanosis of digits and no clubbing  PSYCH: alert & oriented x 3 with fluent speech NEURO: no focal motor/sensory deficits  LABORATORY DATA:  I have reviewed the data as listed CBC Latest Ref Rng 12/07/2015 08/30/2015 06/08/2015  WBC 3.9 - 10.3 10e3/uL 16.6(H) 16.1(H) 12.8(H)  Hemoglobin 11.6 - 15.9 g/dL 13.7 13.5 13.6  Hematocrit 34.8 - 46.6 % 41.4 40.8 41.0  Platelets 145 - 400 10e3/uL 169 172 168    CMP Latest Ref Rng 08/30/2015 09/14/2014 08/16/2014  Glucose 70 - 140 mg/dl 102 131 104  BUN 7.0 -  26.0 mg/dL 13.7 13.8 12.4  Creatinine 0.6 - 1.1 mg/dL 1.0 1.0 0.9  Sodium 136 - 145 mEq/L 141 143 140  Potassium 3.5 - 5.1 mEq/L 4.5 4.0 4.0  Chloride - - - -  CO2 22 - 29 mEq/L 24 24 22   Calcium 8.4 - 10.4 mg/dL 9.4 8.8 9.4  Total Protein 6.4 - 8.3 g/dL 6.7 6.6 6.5  Total Bilirubin 0.20 - 1.20 mg/dL 0.46 0.54 0.43  Alkaline Phos 40 - 150 U/L 46 46 45  AST 5 - 34 U/L 24 25 25   ALT 0 - 55 U/L 21 20 25      Flow cytometry 08/16/2014 Peripheral Blood Flow Cytometry - ABNORMAL B-CELL POPULATION DETECTED, SEE COMMENT. Diagnosis Comment: There is an abnormal population of B-cells with expression of B-cell markers including CD23. CD20 expression is dim and  light chain expression is too dim for accurate assessment of clonality. There is no significant expression of CD5 or CD10. Markers for hairy cell leukemia are negative. Review of the peripheral blood reveals a lymphocytosis with predominately small lymphocytes with coarse chromatin and scant cytoplasm. 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 The expression of CD23, dim CD20, dim light chains, and morphology are most suggestive of chronic lymphocytic leukemia, however, CD5 is negative which is rather uncommon. Correlation with cytogenetics may be of some utility.  RADIOGRAPHIC STUDIES: I have personally reviewed the radiological images as listed and agreed with the findings in the report.  CT chest, abdomen and pelvis on 09/06/2014 IMPRESSION: No evidence of mass, lymphadenopathy, or other acute findings.  Mild hepatic steatosis and colonic diverticulosis incidentally noted.  ASSESSMENT & PLAN:  75 year old Caucasian female with past medical history of brain aneurysm, hypertension, borderline diabetes, macular degeneration, hearing loss, who was incidentally found to have leukocytosis on routine lab since 2015.  1. Chronic ascitic leukemia (CLL), stage 0  -I reviewed her flow cytometry results  with her and her husband. This is most consistent with CLL. She only has mild lymphocytosis at 9.5K, no adenopathy or splenomegaly by physical exam and CAT scan. No anemia and thrombocytopenia. -She feels well without any symptoms. I recommend observation at this stage. I explained to her she has very early stage disease, and it could be a very intercurrent course and she may not treatment for long time.  The treatment indications include symptomatic CLL, advanced stage or rapid lymphocytes doubling time. -FISH panel includes del(17p), del(11q), trisomy 12, and del(13q) , beta-2 microglobulin normal, ZAP70 (-)  -Her WBC and absolute lymphocyte count has been slowly trending up, ALC 12K today, comapring to 8.8 from 9 months ago  -She is clinically doing well, no anemia or thrombocytopenia. I reviewed her lab results with her. -Follow-up her CBC and differential every 3 month   2. Hypertension and other comorbidity She will continue follow-up with her primary care physician Dr. Joylene Draft.  Plan -LAB CBC and diff very 3 month  -I will see her back in 6 month  All questions were answered. The patient knows to call the clinic with any problems, questions or concerns. I spent 15 minutes counseling the patient face to face. The total time spent in the appointment was 20 minutes and more than 50% was on counseling.     Truitt Merle, MD 12/07/2015 1:08 PM

## 2015-12-07 NOTE — Telephone Encounter (Signed)
Gave and printed appt sched and avs fo rpt for July and OCT °

## 2016-01-31 ENCOUNTER — Encounter: Payer: Self-pay | Admitting: Cardiovascular Disease

## 2016-01-31 ENCOUNTER — Ambulatory Visit (INDEPENDENT_AMBULATORY_CARE_PROVIDER_SITE_OTHER): Payer: Medicare Other | Admitting: Cardiovascular Disease

## 2016-01-31 VITALS — BP 130/70 | HR 78 | Ht 65.0 in | Wt 180.0 lb

## 2016-01-31 DIAGNOSIS — I4891 Unspecified atrial fibrillation: Secondary | ICD-10-CM

## 2016-01-31 DIAGNOSIS — I10 Essential (primary) hypertension: Secondary | ICD-10-CM | POA: Diagnosis not present

## 2016-01-31 NOTE — Patient Instructions (Signed)

## 2016-01-31 NOTE — Progress Notes (Signed)
Cardiology Office Note Date:  01/31/2016   ID:  Bethany Oconnor, DOB 1941-04-14, MRN [id number]  PCP:  Ezequiel Kayser, MD  Cardiologist:  Tonny Bollman, MD    Chief Complaint  Patient presents with  . paroxysmal atrial fibrillation  . essential hypertension  . Ostium secundum ASD   History of Present Illness: Bethany Oconnor is a 75 y.o. female who presents for follow-up evaluation. The patient has paroxysmal atrial fibrillation and a small ostium secundum ASD. She has been managed medically. She's chronically anticoagulated with Pradaxa, and she has no history of bleeding problems. Her most recent echocardiogram in 2014 demonstrated normal LV systolic function. There was no clear evidence of color-flow across the interatrial septum. There was no right ventricular enlargement.   She is doing ok. Lots of stress around her husband's recent diagnosis of cirrhosis. From a cardiac perspective she's doing fine. Today, she denies symptoms of palpitations, chest pain, shortness of breath, orthopnea, PND, lower extremity edema, dizziness, or syncope. She continues to be very sedentary with significant vision loss from macular degeneration.  Past Medical History  Diagnosis Date  . Paroxysmal atrial fibrillation (HCC)   . Cerebral aneurysm     s/p endovascular colling 2008  . Hearing loss   . Diabetes mellitus     borderline  . Hypertension     mild  . Macular degeneration   . Neuropathy (HCC)   . Cervical cancer [facility])     Past Surgical History  Procedure Laterality Date  . Thyroidectomy    . Cervical cone biopsy    . Cataract extraction      bilateral    Current Outpatient Prescriptions  Medication Sig Dispense Refill  . beta carotene w/minerals (OCUVITE) tablet Take 1 tablet by mouth 2 (two) times daily.    . Calcium Carbonate-Vitamin D 600-400 MG-UNIT per tablet Take 1 tablet by mouth daily.    Marland Kitchen CARTIA XT 180 MG 24 hr capsule TAKE 1 CAPSULE BY MOUTH DAILY 90 capsule 3    . dabigatran (PRADAXA) 150 MG CAPS Take 1 capsule (150 mg total) by mouth every 12 (twelve) hours. 180 capsule 3  . hydrochlorothiazide (HYDRODIURIL) 25 MG tablet Take 12.5 mg by mouth 2 (two) times a week. Take 12.5mg  every Monday and Thursday    . lisinopril (PRINIVIL,ZESTRIL) 10 MG tablet Take 10 mg by mouth daily.      Marland Kitchen LORazepam (ATIVAN) 1 MG tablet Take 1 mg by mouth at bedtime.      . metoprolol (TOPROL-XL) 50 MG 24 hr tablet Take 50 mg by mouth daily.     . pravastatin (PRAVACHOL) 40 MG tablet Take 40 mg by mouth daily as needed (cholesterol).     Marland Kitchen PREMPRO 0.625-5 MG per tablet Take 1 tablet by mouth daily.   3  . VIIBRYD 20 MG TABS Take 1 tablet by mouth daily.     No current facility-administered medications for this visit.    Allergies:   Codeine   Social History:  The patient  reports that she quit smoking about 27 years ago. She does not have any smokeless tobacco history on file. She reports that she does not use illicit drugs.   Family History:  The patient's  family history includes Cancer (age of onset: 56) in her father; Cerebral aneurysm (age of onset: 18) in her mother; Heart attack in her brother.    ROS:  Please see the history of present illness.  Otherwise, review of systems is positive  for anxiety.  All other systems are reviewed and negative.    PHYSICAL EXAM: VS:  BP 130/70 mmHg  Pulse 78  Ht 5\' 5"  (1.651 m)  Wt 180 lb (81.647 kg)  BMI 29.95 kg/m2 , BMI Body mass index is 29.95 kg/(m^2). GEN: Well nourished, well developed, in no acute distress HEENT: normal Neck: no JVD, no masses. No carotid bruits Cardiac: RRR without murmur or gallop                Respiratory:  clear to auscultation bilaterally, normal work of breathing GI: soft, nontender, nondistended, + BS MS: no deformity or atrophy Ext: no pretibial edema, pedal pulses 2+= bilaterally Skin: warm and dry, no rash Neuro:  Strength and sensation are intact Psych: euthymic mood, full  affect  EKG:  EKG is ordered today. The ekg ordered today shows NSR 78 bpm, rightward axis, no other abnormalities  Recent Labs: 12/07/2015: ALT 24; BUN 15.1; Creatinine 0.9; HGB 13.7; Platelets 169; Potassium 4.3; Sodium 141   Lipid Panel  No results found for: CHOL, TRIG, HDL, CHOLHDL, VLDL, LDLCALC, LDLDIRECT    Wt Readings from Last 3 Encounters:  01/31/16 180 lb (81.647 kg)  12/07/15 179 lb 9.6 oz (81.466 kg)  08/08/15 178 lb (80.74 kg)    ASSESSMENT AND PLAN: 1.  Paroxysmal atrial fibrillation: CHADS-Vasc = 4 (age 35, female gender, HTN). Continue pradaxa, diltiazem, metoprolol. Maintaining sinus rhythm.  2. Essential HTN: BP controlled.  3. Ostium secundum ASD. Small defect with no signs of right-sided cardiac enlargement.   4. Situational stress: sees Dr Joylene Draft this afternoon. Talked to patient and her husband at length today about issues around his diagnosis of cirrhosis.   Current medicines are reviewed with the patient today.  The patient does not have concerns regarding medicines.  Labs/ tests ordered today include:  Orders Placed This Encounter  Procedures  . EKG 12-Lead    Disposition:   FU 6 months  Signed, Sherren Mocha, MD  01/31/2016 5:52 PM    [facility] [place], [place], [place]  [address] Phone: [phone]; Fax: [phone]

## 2016-02-13 ENCOUNTER — Other Ambulatory Visit: Payer: Self-pay | Admitting: Cardiovascular Disease

## 2016-03-14 ENCOUNTER — Other Ambulatory Visit (HOSPITAL_BASED_OUTPATIENT_CLINIC_OR_DEPARTMENT_OTHER): Payer: Medicare Other

## 2016-03-14 DIAGNOSIS — C9110 Chronic lymphocytic leukemia of B-cell type not having achieved remission: Secondary | ICD-10-CM | POA: Diagnosis not present

## 2016-03-14 LAB — CBC WITH DIFFERENTIAL/PLATELET
BASO%: 0.2 % (ref 0.0–2.0)
Basophils Absolute: 0 10*3/uL (ref 0.0–0.1)
EOS ABS: 0.2 10*3/uL (ref 0.0–0.5)
EOS%: 1.1 % (ref 0.0–7.0)
HEMATOCRIT: 42 % (ref 34.8–46.6)
HGB: 13.9 g/dL (ref 11.6–15.9)
LYMPH%: 70.9 % — AB (ref 14.0–49.7)
MCH: 33.7 pg (ref 25.1–34.0)
MCHC: 33.2 g/dL (ref 31.5–36.0)
MCV: 101.5 fL — AB (ref 79.5–101.0)
MONO#: 0.9 10*3/uL (ref 0.1–0.9)
MONO%: 5.8 % (ref 0.0–14.0)
NEUT#: 3.5 10*3/uL (ref 1.5–6.5)
NEUT%: 22 % — AB (ref 38.4–76.8)
PLATELETS: 175 10*3/uL (ref 145–400)
RBC: 4.14 10*6/uL (ref 3.70–5.45)
RDW: 13.7 % (ref 11.2–14.5)
WBC: 15.8 10*3/uL — ABNORMAL HIGH (ref 3.9–10.3)
lymph#: 11.2 10*3/uL — ABNORMAL HIGH (ref 0.9–3.3)

## 2016-03-14 LAB — TECHNOLOGIST REVIEW

## 2016-03-27 ENCOUNTER — Telehealth: Payer: Self-pay | Admitting: Cardiovascular Disease

## 2016-03-27 NOTE — Telephone Encounter (Signed)
New  Message       The pt wants to speak with a nurse please

## 2016-03-27 NOTE — Telephone Encounter (Signed)
Spoke with pt's husband, Wille Glaser, [place] on file.  Pt is in [facility] and was out to eat last night and fainted and was unresponsive.  Pt was taken to [facility] and was seen by Dr. Romilda Joy.  Husband states Dr. Eulis Foster wanted pt to see Dr. Burt Knack ASAP.  Pt is not planning on coming back to town until Monday but they will come back sooner if Dr. Burt Knack is able to see the pt.  Offered PA appt but husband states they prefer she see Dr. Burt Knack.  Advised husband that I would route this message to Dr. Burt Knack and his nurse for review and advisement.

## 2016-03-28 NOTE — Telephone Encounter (Signed)
Follow-up         The husband is calling back to see if there is available time on Dr. Burt Knack schedule next Wednesday for his wife, the husband states he spoke to [facility] yesterday

## 2016-03-28 NOTE — Telephone Encounter (Signed)
Called pt, unable to leave a message due to the mailbox is full.

## 2016-03-29 NOTE — Telephone Encounter (Signed)
Left message on machine for pt's husband to contact the office in regards to scheduling appointment for pt.

## 2016-04-01 NOTE — Telephone Encounter (Signed)
Will check with Lauren on appt availability

## 2016-04-02 NOTE — Telephone Encounter (Signed)
I spoke with Bethany Oconnor and scheduled the pt to see Dr Burt Knack on 04/04/16 in follow-up.  He does have a note from the pt's hospital visit and will bring this to appointment.

## 2016-04-03 ENCOUNTER — Encounter: Payer: Self-pay | Admitting: Cardiovascular Disease

## 2016-04-04 ENCOUNTER — Encounter: Payer: Self-pay | Admitting: Cardiovascular Disease

## 2016-04-04 ENCOUNTER — Ambulatory Visit (INDEPENDENT_AMBULATORY_CARE_PROVIDER_SITE_OTHER): Payer: Medicare Other | Admitting: Cardiovascular Disease

## 2016-04-04 VITALS — BP 124/70 | HR 77 | Ht 65.0 in | Wt 176.1 lb

## 2016-04-04 DIAGNOSIS — I10 Essential (primary) hypertension: Secondary | ICD-10-CM

## 2016-04-04 DIAGNOSIS — R55 Syncope and collapse: Secondary | ICD-10-CM | POA: Diagnosis not present

## 2016-04-04 DIAGNOSIS — I4891 Unspecified atrial fibrillation: Secondary | ICD-10-CM | POA: Diagnosis not present

## 2016-04-04 NOTE — Progress Notes (Signed)
Cardiology Office Note Date:  04/04/2016   ID:  Bethany Oconnor, DOB 1940/12/30, MRN [id number]  PCP:  Jerlyn Ly, MD  Cardiologist:  Sherren Mocha, MD    Chief Complaint  Patient presents with  . Follow-up   History of Present Illness: Bethany Oconnor is a 75 y.o. female who presents for evaluation of syncope.   The patient was just seen 01/31/2016 for her regular follow-up. She has paroxysmal atrial fibrillation and she is on long-term anticoagulation with Pradaxa. She was doing well at that time. She was out to dinner with her husband when she began to feel dizzy and diaphoretic. She developed nausea. She then states that her head felt too heavy to hold up and she tried to get up and walk to the bathroom because she had the urge to have a bowel movement. When she stood up she collapsed to the ground. She never frankly lost consciousness but she was poorly responsive for a few minutes. She was evaluated at the local emergency department and no problems were found. She was advised to follow-up here.  She has done well since this time. She denies any recurrent symptoms of lightheadedness. She has been healing and drinking normally. She denies chest pain, shortness of breath, or heart palpitations.   Past Medical History:  Diagnosis Date  . Cerebral aneurysm    s/p endovascular colling 2008  . Cervical cancer ([place])   . Diabetes mellitus    borderline  . Hearing loss   . Hypertension    mild  . Macular degeneration   . Neuropathy ([place])   . Paroxysmal atrial fibrillation [facility])     Past Surgical History:  Procedure Laterality Date  . CATARACT EXTRACTION     bilateral  . CERVICAL CONE BIOPSY    . THYROIDECTOMY      Current Outpatient Prescriptions  Medication Sig Dispense Refill  . beta carotene w/minerals (OCUVITE) tablet Take 1 tablet by mouth 2 (two) times daily.    . Calcium Carbonate-Vitamin D 600-400 MG-UNIT per tablet Take 1 tablet by mouth daily.    Marland Kitchen  CARTIA XT 180 MG 24 hr capsule TAKE 1 CAPSULE BY MOUTH DAILY 90 capsule 3  . dabigatran (PRADAXA) 150 MG CAPS Take 1 capsule (150 mg total) by mouth every 12 (twelve) hours. 180 capsule 3  . hydrochlorothiazide (HYDRODIURIL) 25 MG tablet Take 12.5 mg by mouth 2 (two) times a week. Take 12.5mg  every Monday and Thursday    . lisinopril (PRINIVIL,ZESTRIL) 10 MG tablet Take 10 mg by mouth daily.      Marland Kitchen LORazepam (ATIVAN) 1 MG tablet Take 1 mg by mouth at bedtime.      . metoprolol (TOPROL-XL) 50 MG 24 hr tablet Take 50 mg by mouth daily.     . pravastatin (PRAVACHOL) 40 MG tablet Take 40 mg by mouth daily as needed (cholesterol).     Marland Kitchen PREMPRO 0.625-5 MG per tablet Take 1 tablet by mouth daily.   3  . VIIBRYD 20 MG TABS Take 1 tablet by mouth daily.     No current facility-administered medications for this visit.     Allergies:   Codeine   Social History:  The patient  reports that she quit smoking about 27 years ago. She has never used smokeless tobacco. She reports that she does not use drugs.   Family History:  The patient's family history includes Cancer (age of onset: 74) in her father; Cerebral aneurysm (age of onset: 57) in  her mother; Heart attack in her brother.    ROS:  Please see the history of present illness.  All other systems are reviewed and negative.    PHYSICAL EXAM: VS:  BP 124/70 (BP Location: Left Arm, Patient Position: Sitting, Cuff Size: Normal)   Pulse 77   Ht 5\' 5"  (1.651 m)   Wt 176 lb 1.9 oz (79.9 kg)   BMI 29.31 kg/m  , BMI Body mass index is 29.31 kg/m. GEN: Well nourished, well developed, in no acute distress  HEENT: normal  Neck: no JVD, no masses. No carotid bruits Cardiac: RRR without murmur or gallop                Respiratory:  clear to auscultation bilaterally, normal work of breathing GI: soft, nontender, nondistended, + BS MS: no deformity or atrophy  Ext: no pretibial edema, pedal pulses 2+= bilaterally Skin: warm and dry, no rash Neuro:   Strength and sensation are intact Psych: euthymic mood, full affect  EKG:  EKG is ordered today. The ekg ordered today shows normal sinus rhythm 77 bpm, within normal limits.  Recent Labs: 12/07/2015: ALT 24; BUN 15.1; Creatinine 0.9; Potassium 4.3; Sodium 141 03/14/2016: HGB 13.9; Platelets 175   Lipid Panel  No results found for: CHOL, TRIG, HDL, CHOLHDL, VLDL, LDLCALC, LDLDIRECT    Wt Readings from Last 3 Encounters:  04/04/16 176 lb 1.9 oz (79.9 kg)  01/31/16 180 lb (81.6 kg)  12/07/15 179 lb 9.6 oz (81.5 kg)    ASSESSMENT AND PLAN: 1.  Near-syncope: Etiology unclear but typical prodrome of a vasovagal episode. She's had no recurrence. Her emergency department workup was unrevealing. I recommended a 2-D echocardiogram to assess for any changes in her cardiac function. Her last study was 3 years ago. She will continue on her current medicines as her blood pressure and pulse rate are within normal limits today. She was counseled about the importance of alcohol reduction, maintaining adequate fluid hydration, and precautions to take if she begins to feel any prodromal symptoms of syncope.  2. Paroxysmal atrial fibrillation: Maintaining sinus rhythm on diltiazem and metoprolol. Continue Pradaxa for anticoagulation.  3. Ostium secundum ASD: Small defect with no evidence of right sided cardiac enlargement. Follow-up echo as above.  Current medicines are reviewed with the patient today.  The patient does not have concerns regarding medicines.  Labs/ tests ordered today include:  Orders Placed This Encounter  Procedures  . EKG 12-Lead  . ECHOCARDIOGRAM COMPLETE    Disposition:   FU as scheduled in December.   Deatra James, MD  04/04/2016 2:00 PM    [facility] [address], [place], [place]  [address] Phone: [phone]; Fax: [phone]

## 2016-04-04 NOTE — Patient Instructions (Addendum)
Medication Instructions:  Your physician recommends that you continue on your current medications as directed. Please refer to the Current Medication list given to you today.  Labwork: No new orders.   Testing/Procedures: Your physician has requested that you have an echocardiogram. Echocardiography is a painless test that uses sound waves to create images of your heart. It provides your doctor with information about the size and shape of your heart and how well your heart's chambers and valves are working. This procedure takes approximately one hour. There are no restrictions for this procedure.  Follow-Up: Your physician recommends that you schedule a follow-up appointment in: December with Dr Burt Knack  Any Other Special Instructions Will Be Listed Below (If Applicable).  INCREASE Fluid intake.    If you need a refill on your cardiac medications before your next appointment, please call your pharmacy.

## 2016-04-17 ENCOUNTER — Ambulatory Visit (HOSPITAL_COMMUNITY): Payer: Medicare Other | Attending: Cardiology

## 2016-04-17 ENCOUNTER — Other Ambulatory Visit: Payer: Self-pay

## 2016-04-17 DIAGNOSIS — I10 Essential (primary) hypertension: Secondary | ICD-10-CM | POA: Diagnosis not present

## 2016-04-17 DIAGNOSIS — Q211 Atrial septal defect: Secondary | ICD-10-CM | POA: Insufficient documentation

## 2016-04-17 DIAGNOSIS — I340 Nonrheumatic mitral (valve) insufficiency: Secondary | ICD-10-CM | POA: Diagnosis not present

## 2016-04-17 DIAGNOSIS — R55 Syncope and collapse: Secondary | ICD-10-CM

## 2016-04-17 DIAGNOSIS — I071 Rheumatic tricuspid insufficiency: Secondary | ICD-10-CM | POA: Insufficient documentation

## 2016-04-17 DIAGNOSIS — I4891 Unspecified atrial fibrillation: Secondary | ICD-10-CM | POA: Diagnosis not present

## 2016-04-17 DIAGNOSIS — Z87891 Personal history of nicotine dependence: Secondary | ICD-10-CM | POA: Diagnosis not present

## 2016-04-17 DIAGNOSIS — I351 Nonrheumatic aortic (valve) insufficiency: Secondary | ICD-10-CM | POA: Insufficient documentation

## 2016-04-17 DIAGNOSIS — I119 Hypertensive heart disease without heart failure: Secondary | ICD-10-CM | POA: Insufficient documentation

## 2016-04-17 LAB — ECHOCARDIOGRAM COMPLETE
AOASC: 33 cm
CHL CUP LV S' LATERAL: 11.7 cm/s
E decel time: 211 msec
EERAT: 7.69
FS: 26 % — AB (ref 28–44)
IV/PV OW: 0.97
LA diam end sys: 29 mm
LA diam index: 1.55 cm/m2
LA vol A4C: 28 ml
LA vol index: 18.2 mL/m2
LA vol: 34 mL
LASIZE: 29 mm
LDCA: 3.14 cm2
LV E/e'average: 7.69
LV TDI E'MEDIAL: 4.7
LV e' LATERAL: 6.96 cm/s
LVEEMED: 7.69
LVOT SV: 45 mL
LVOT VTI: 14.4 cm
LVOT diameter: 20 mm
LVOT peak vel: 65.3 cm/s
MV Dec: 211
MVPKAVEL: 83 m/s
MVPKEVEL: 53.5 m/s
PW: 11.7 mm — AB (ref 0.6–1.1)
Reg peak vel: 204 cm/s
TDI e' lateral: 6.96
TR max vel: 204 cm/s

## 2016-06-06 ENCOUNTER — Other Ambulatory Visit (HOSPITAL_BASED_OUTPATIENT_CLINIC_OR_DEPARTMENT_OTHER): Payer: Medicare Other

## 2016-06-06 ENCOUNTER — Ambulatory Visit (HOSPITAL_BASED_OUTPATIENT_CLINIC_OR_DEPARTMENT_OTHER): Payer: Medicare Other | Admitting: Hematology

## 2016-06-06 ENCOUNTER — Telehealth: Payer: Self-pay | Admitting: Hematology

## 2016-06-06 ENCOUNTER — Encounter: Payer: Self-pay | Admitting: Hematology

## 2016-06-06 VITALS — BP 119/63 | HR 71 | Temp 98.2°F | Resp 17 | Ht 65.0 in | Wt 175.1 lb

## 2016-06-06 DIAGNOSIS — C9110 Chronic lymphocytic leukemia of B-cell type not having achieved remission: Secondary | ICD-10-CM

## 2016-06-06 DIAGNOSIS — I4891 Unspecified atrial fibrillation: Secondary | ICD-10-CM | POA: Diagnosis not present

## 2016-06-06 DIAGNOSIS — I10 Essential (primary) hypertension: Secondary | ICD-10-CM

## 2016-06-06 LAB — CBC WITH DIFFERENTIAL/PLATELET
BASO%: 0.1 % (ref 0.0–2.0)
Basophils Absolute: 0 10*3/uL (ref 0.0–0.1)
EOS%: 0.8 % (ref 0.0–7.0)
Eosinophils Absolute: 0.2 10*3/uL (ref 0.0–0.5)
HCT: 41.2 % (ref 34.8–46.6)
HGB: 14 g/dL (ref 11.6–15.9)
LYMPH%: 76.3 % — AB (ref 14.0–49.7)
MCH: 33.7 pg (ref 25.1–34.0)
MCHC: 34 g/dL (ref 31.5–36.0)
MCV: 99 fL (ref 79.5–101.0)
MONO#: 1 10*3/uL — AB (ref 0.1–0.9)
MONO%: 4.9 % (ref 0.0–14.0)
NEUT%: 17.9 % — AB (ref 38.4–76.8)
NEUTROS ABS: 3.7 10*3/uL (ref 1.5–6.5)
PLATELETS: 182 10*3/uL (ref 145–400)
RBC: 4.16 10*6/uL (ref 3.70–5.45)
RDW: 13.7 % (ref 11.2–14.5)
WBC: 20.9 10*3/uL — ABNORMAL HIGH (ref 3.9–10.3)
lymph#: 16 10*3/uL — ABNORMAL HIGH (ref 0.9–3.3)

## 2016-06-06 LAB — TECHNOLOGIST REVIEW

## 2016-06-06 NOTE — Progress Notes (Signed)
[place]  [phone] [phone]  Clinic Follow up Note   Patient Care Team: Crist Infante, MD as PCP - General (Internal Medicine) Crist Infante, MD (Internal Medicine) Newton Pigg, MD as Consulting Physician (Obstetrics and Gynecology) 06/06/2016  CHIEF COMPLAINTS Follow-up CLL, stage 0   HISTORY OF INITIAL PRESENTING ILLNESS (08/16/2014):  Bethany Oconnor 75 y.o. female is here because of her asymptomatic lymphocytosis which was found on routine blood test.  Per patient, her white count was slightly elevated 1 year ago on the routine lab test. He had repeated lab test on 07/22/2014 by her primary care physician Dr. Joylene Draft. It showed WBC 13.3 K, absolute lymphocyte count 9.4K, hemoglobin 12.4, and platelet count 270 8K. Her TSH was normal. She was then referred to our cancer center for further evaluation.  She feels well in general, has no particular complaints. She denies any fever or chill, no night sweats, no weight loss in the past 5 years. She has macular degeneration, and she cannot see since very well. She denies any particular pain, cough, dyspnea, GI or other symptoms.  CURRENT THERAPY: Observation  INTERIM HISTORY Bethany Oconnor returns for follow-up. She is accompanied to the clinic by her husband. [facility]. She is doing very well, denies any pain, fever, night sweats, or other symptoms. She has mild fatigue, stable, she functions very well at home. [facility].   MEDICAL HISTORY:  Past Medical History:  Diagnosis Date  . Cerebral aneurysm    s/p endovascular colling 2008  . Cervical cancer ([place])   . Diabetes mellitus    borderline  . Hearing loss   . Hypertension    mild  . Macular degeneration   . Neuropathy ([place])   . Paroxysmal atrial fibrillation ([place])     SURGICAL HISTORY: Past Surgical History:  Procedure Laterality Date  . CATARACT EXTRACTION     bilateral  .  CERVICAL CONE BIOPSY    . THYROIDECTOMY      SOCIAL HISTORY: Social History   Social History  . Marital status: Married    Spouse name: N/A  . Number of children: 2  . Years of education: N/A   Occupational History  .  Retired   Social History Main Topics  . Smoking status: Former Smoker    Quit date: 09/02/1988  . Smokeless tobacco: Never Used  . Alcohol use Not on file     Comment: daily for 50 years   . Drug use: No  . Sexual activity: Not on file   Other Topics Concern  . Not on file   Social History Narrative  . No narrative on file    FAMILY HISTORY: Family History  Problem Relation Age of Onset  . Cancer Father 49    died  . Cerebral aneurysm Mother 60    died  . Heart attack Brother     ALLERGIES:  is allergic to codeine.  MEDICATIONS:  Current Outpatient Prescriptions  Medication Sig Dispense Refill  . beta carotene w/minerals (OCUVITE) tablet Take 1 tablet by mouth 2 (two) times daily.    . Calcium Carbonate-Vitamin D 600-400 MG-UNIT per tablet Take 1 tablet by mouth daily.    Marland Kitchen CARTIA XT 180 MG 24 hr capsule TAKE 1 CAPSULE BY MOUTH DAILY 90 capsule 3  . dabigatran (PRADAXA) 150 MG CAPS Take 1 capsule (150 mg total) by mouth every 12 (twelve) hours. 180 capsule 3  .  hydrochlorothiazide (HYDRODIURIL) 25 MG tablet Take 12.5 mg by mouth 2 (two) times a week. Take 12.5mg  every Monday and Thursday    . lisinopril (PRINIVIL,ZESTRIL) 10 MG tablet Take 10 mg by mouth daily.      Marland Kitchen LORazepam (ATIVAN) 1 MG tablet Take 1 mg by mouth at bedtime.      . metoprolol (TOPROL-XL) 50 MG 24 hr tablet Take 50 mg by mouth daily.     . pravastatin (PRAVACHOL) 40 MG tablet Take 40 mg by mouth daily as needed (cholesterol).     Marland Kitchen PREMPRO 0.625-5 MG per tablet Take 1 tablet by mouth daily.   3  . VIIBRYD 20 MG TABS Take 1 tablet by mouth daily.     No current facility-administered medications for this visit.     REVIEW OF SYSTEMS:   Constitutional: Denies fevers, chills  or abnormal night sweats Eyes: Denies blurriness of vision, double vision or watery eyes Ears, nose, mouth, throat, and face: Denies mucositis or sore throat Respiratory: Denies cough, dyspnea or wheezes Cardiovascular: Denies palpitation, chest discomfort or lower extremity swelling Gastrointestinal:  Denies nausea, heartburn or change in bowel habits Skin: Denies abnormal skin rashes Lymphatics: Denies new lymphadenopathy or easy bruising Neurological:Denies numbness, tingling or new weaknesses Behavioral/Psych: Mood is stable, no new changes  All other systems were reviewed with the patient and are negative.  PHYSICAL EXAMINATION: ECOG PERFORMANCE STATUS: 0 - Asymptomatic  Vitals:   06/06/16 1318  BP: 119/63  Pulse: 71  Resp: 17  Temp: 98.2 F (36.8 C)   Filed Weights   06/06/16 1318  Weight: 175 lb 1.6 oz (79.4 kg)    GENERAL:alert, no distress and comfortable SKIN: skin color, texture, turgor are normal, no rashes or significant lesions EYES: normal, conjunctiva are pink and non-injected, sclera clear OROPHARYNX:no exudate, no erythema and lips, buccal mucosa, and tongue normal  NECK: supple, thyroid normal size, non-tender, without nodularity LYMPH:  no palpable lymphadenopathy in the cervical, axillary or inguinal LUNGS: clear to auscultation and percussion with normal breathing effort HEART: regular rate & rhythm and no murmurs and no lower extremity edema ABDOMEN:abdomen soft, non-tender and normal bowel sounds Musculoskeletal:no cyanosis of digits and no clubbing  PSYCH: alert & oriented x 3 with fluent speech NEURO: no focal motor/sensory deficits  LABORATORY DATA:  I have reviewed the data as listed CBC Latest Ref Rng & Units 06/06/2016 03/14/2016 12/07/2015  WBC 3.9 - 10.3 10e3/uL 20.9(H) 15.8(H) 16.6(H)  Hemoglobin 11.6 - 15.9 g/dL 14.0 13.9 13.7  Hematocrit 34.8 - 46.6 % 41.2 42.0 41.4  Platelets 145 - 400 10e3/uL 182 175 169    CMP Latest Ref Rng & Units  12/07/2015 08/30/2015 09/14/2014  Glucose 70 - 140 mg/dl 87 102 131  BUN 7.0 - 26.0 mg/dL 15.1 13.7 13.8  Creatinine 0.6 - 1.1 mg/dL 0.9 1.0 1.0  Sodium 136 - 145 mEq/L 141 141 143  Potassium 3.5 - 5.1 mEq/L 4.3 4.5 4.0  Chloride - - - -  CO2 22 - 29 mEq/L 24 24 24   Calcium 8.4 - 10.4 mg/dL 9.8 9.4 8.8  Total Protein 6.4 - 8.3 g/dL 6.9 6.7 6.6  Total Bilirubin 0.20 - 1.20 mg/dL 0.45 0.46 0.54  Alkaline Phos 40 - 150 U/L 42 46 46  AST 5 - 34 U/L 29 24 25   ALT 0 - 55 U/L 24 21 20      Flow cytometry 08/16/2014 Peripheral Blood Flow Cytometry - ABNORMAL B-CELL POPULATION DETECTED, SEE COMMENT. Diagnosis Comment: There  is an abnormal population of B-cells with expression of B-cell markers including CD23. CD20 expression is dim and light chain expression is too dim for accurate assessment of clonality. There is no significant expression of CD5 or CD10. Markers for hairy cell leukemia are negative. Review of the peripheral blood reveals a lymphocytosis with predominately small lymphocytes with coarse chromatin and scant cytoplasm. 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 The expression of CD23, dim CD20, dim light chains, and morphology are most suggestive of chronic lymphocytic leukemia, however, CD5 is negative which is rather uncommon. Correlation with cytogenetics may be of some utility.  RADIOGRAPHIC STUDIES: I have personally reviewed the radiological images as listed and agreed with the findings in the report.  CT chest, abdomen and pelvis on 09/06/2014 IMPRESSION: No evidence of mass, lymphadenopathy, or other acute findings.  Mild hepatic steatosis and colonic diverticulosis incidentally noted.  ASSESSMENT & PLAN:  75 year old Caucasian female with past medical history of brain aneurysm, hypertension, borderline diabetes, macular degeneration, hearing loss, who was incidentally found to have leukocytosis on routine lab since 2015.  1.  Chronic ascitic leukemia (CLL), stage 0  -I reviewed her flow cytometry results with her and her husband. This is most consistent with CLL. She only has mild lymphocytosis at 9.5K, no adenopathy or splenomegaly by physical exam and CAT scan. No anemia and thrombocytopenia. -She feels well without any symptoms. I recommend observation at this stage. I explained to her she has very early stage disease, and it could be a very intercurrent course and she may not treatment for long time.  The treatment indications include symptomatic CLL, advanced stage or rapid lymphocytes doubling time. -FISH panel includes del(17p), del(11q), trisomy 12, and del(13q) , beta-2 microglobulin normal, ZAP70 (-)  -Her WBC and absolute lymphocyte count has been trending up, ALC 16K today, comapring to 8.8 from one year ago, we'll follow up closely. -She is clinically doing well, no anemia or thrombocytopenia. I reviewed her lab results with her. -Follow-up her CBC and differential every 2 months   2. Hypertension and other comorbidity She will continue follow-up with her primary care physician Dr. Joylene Draft.  3. AF -she is on Pradaxa, follow-up with her cardiologist.  4. Immunizations -We discussed that CLLpatients has compromised immune system, I strongly encouraged her to keep her vaccination up-to-date -[facility].  Plan -LAB CBC and diff in 2 months at Dr. Silvestre Mesi office (she is scheduled), I'll send a message to Dr. Joylene Draft -I will see her back in 4 month with lab   CC: Drs. Perini and [place]  All questions were answered. The patient knows to call the clinic with any problems, questions or concerns. I spent 15 minutes counseling the patient face to face. The total time spent in the appointment was 20 minutes and more than 50% was on counseling.     Truitt Merle, MD 06/06/2016 1:27 PM

## 2016-06-06 NOTE — Telephone Encounter (Signed)
Avs report and appointment schedule given to patient, per 06/06/16 los. ° °

## 2016-08-19 ENCOUNTER — Ambulatory Visit (INDEPENDENT_AMBULATORY_CARE_PROVIDER_SITE_OTHER): Payer: Medicare Other | Admitting: Cardiovascular Disease

## 2016-08-19 ENCOUNTER — Encounter: Payer: Self-pay | Admitting: Cardiovascular Disease

## 2016-08-19 VITALS — BP 132/70 | HR 80 | Ht 65.5 in | Wt 178.8 lb

## 2016-08-19 DIAGNOSIS — I4891 Unspecified atrial fibrillation: Secondary | ICD-10-CM | POA: Diagnosis not present

## 2016-08-19 DIAGNOSIS — I10 Essential (primary) hypertension: Secondary | ICD-10-CM

## 2016-08-19 NOTE — Progress Notes (Signed)
Cardiology Office Note Date:  08/19/2016   ID:  Bethany Oconnor, DOB 12-01-1940, MRN [id number]  PCP:  Jerlyn Ly, MD  Cardiologist:  Sherren Mocha, MD    Chief Complaint  Patient presents with  . Atrial Fibrillation     History of Present Illness: Bethany Oconnor is a 75 y.o. female who presents for follow-up evaluation. The patient has paroxysmal atrial fibrillation and a small ostium secundum ASD. She has been managed medically. She's chronically anticoagulated with Pradaxa, and she has no history of bleeding problems. Her most recent echocardiogram in 2014 demonstrated normal LV systolic function. Right ventricular function has been normal and there is no evidence of RA/RV dilatation by serial echo studies.   The patient denies any cardiac symptoms. She is under a lot of stress with her husband's health. He has developed hepatic cirrhosis. The patient continues to be limited by loss of her vision more than anything else.  Past Medical History:  Diagnosis Date  . Cerebral aneurysm    s/p endovascular colling 2008  . Cervical cancer ([place])   . Diabetes mellitus    borderline  . Hearing loss   . Hypertension    mild  . Macular degeneration   . Neuropathy ([place])   . Paroxysmal atrial fibrillation [facility])     Past Surgical History:  Procedure Laterality Date  . CATARACT EXTRACTION     bilateral  . CERVICAL CONE BIOPSY    . THYROIDECTOMY      Current Outpatient Prescriptions  Medication Sig Dispense Refill  . beta carotene w/minerals (OCUVITE) tablet Take 1 tablet by mouth 2 (two) times daily.    . Calcium Carbonate-Vitamin D 600-400 MG-UNIT per tablet Take 1 tablet by mouth daily.    Marland Kitchen CARTIA XT 180 MG 24 hr capsule TAKE 1 CAPSULE BY MOUTH DAILY 90 capsule 3  . dabigatran (PRADAXA) 150 MG CAPS Take 1 capsule (150 mg total) by mouth every 12 (twelve) hours. 180 capsule 3  . hydrochlorothiazide (HYDRODIURIL) 25 MG tablet Take 12.5 mg by mouth 2 (two) times a week.  Take 12.5mg  every Monday and Thursday    . lisinopril (PRINIVIL,ZESTRIL) 10 MG tablet Take 10 mg by mouth daily.      Marland Kitchen LORazepam (ATIVAN) 1 MG tablet Take 1 mg by mouth at bedtime.      . metoprolol (TOPROL-XL) 50 MG 24 hr tablet Take 50 mg by mouth daily.     . pravastatin (PRAVACHOL) 40 MG tablet Take 40 mg by mouth daily as needed (cholesterol).     Marland Kitchen PREMPRO 0.625-5 MG per tablet Take 1 tablet by mouth daily.   3  . VIIBRYD 20 MG TABS Take 1 tablet by mouth daily.     No current facility-administered medications for this visit.     Allergies:   Codeine   Social History:  The patient  reports that she quit smoking about 27 years ago. She has never used smokeless tobacco. She reports that she does not use drugs.   Family History:  The patient's  family history includes Cancer (age of onset: 49) in her father; Cerebral aneurysm (age of onset: 63) in her mother; Heart attack in her brother.    ROS:  Please see the history of present illness.    All other systems are reviewed and negative.    PHYSICAL EXAM: VS:  BP 132/70   Pulse 80   Ht 5' 5.5" (1.664 m)   Wt 178 lb 12.8 oz (81.1 kg)  BMI 29.30 kg/m  , BMI Body mass index is 29.3 kg/m. GEN: Well nourished, well developed, in no acute distress  HEENT: normal  Neck: no JVD, no masses. No carotid bruits Cardiac: RRR without murmur or gallop                Respiratory:  clear to auscultation bilaterally, normal work of breathing GI: soft, nontender, nondistended, + BS MS: no deformity or atrophy  Ext: no pretibial edema, pedal pulses 2+= bilaterally Skin: warm and dry, no rash Neuro:  Strength and sensation are intact Psych: euthymic mood, full affect  EKG:  EKG is not ordered today.  Recent Labs: 12/07/2015: ALT 24; BUN 15.1; Creatinine 0.9; Potassium 4.3; Sodium 141 06/06/2016: HGB 14.0; Platelets 182   Lipid Panel  No results found for: CHOL, TRIG, HDL, CHOLHDL, VLDL, LDLCALC, LDLDIRECT    Wt Readings from Last 3  Encounters:  08/19/16 178 lb 12.8 oz (81.1 kg)  06/06/16 175 lb 1.6 oz (79.4 kg)  04/04/16 176 lb 1.9 oz (79.9 kg)     ASSESSMENT AND PLAN: 1.  Paroxysmal atrial fibrillation. Continue dabigatran for anticoagulation.   This patients CHA2DS2-VASc Score and unadjusted Ischemic Stroke Rate (% per year) is equal to 4.8 % stroke rate/year from a score of 4  Above score calculated as 1 point each if present [CHF, HTN, DM, Vascular=MI/PAD/Aortic Plaque, Age if 65-74, or Female] Above score calculated as 2 points each if present [Age > 75, or Stroke/TIA/TE]  2. Essential hypertension: Blood pressure well controlled  3. Ostium secundum ASD: Small defect with no signs of hemodynamic significance has her right-sided cardiac chambers are not enlarged.   Current medicines are reviewed with the patient today.  The patient does not have concerns regarding medicines.  Labs/ tests ordered today include:  No orders of the defined types were placed in this encounter.   Disposition:   FU 6 months  Signed, Sherren Mocha, MD  08/19/2016 1:08 PM    [facility] [place], [place], [place]  [address] Phone: [phone]; Fax: [phone]

## 2016-08-19 NOTE — Patient Instructions (Signed)

## 2016-10-08 NOTE — Progress Notes (Signed)
[facility]  [phone] [phone]  Clinic Follow up Note   Patient Care Team: Crist Infante, MD as PCP - General (Internal Medicine) Crist Infante, MD (Internal Medicine) Newton Pigg, MD as Consulting Physician (Obstetrics and Gynecology) Sherren Mocha, MD as Consulting Physician (Cardiology) Truitt Merle, MD as Consulting Physician (Hematology) 10/10/2016  CHIEF COMPLAINTS Follow-up CLL, stage 0   HISTORY OF INITIAL PRESENTING ILLNESS (08/16/2014):  Bethany Oconnor 76 y.o. female is here because of her asymptomatic lymphocytosis which was found on routine blood test.  Per patient, her white count was slightly elevated 1 year ago on the routine lab test. He had repeated lab test on 07/22/2014 by her primary care physician Dr. Joylene Draft. It showed WBC 13.3 K, absolute lymphocyte count 9.4K, hemoglobin 12.4, and platelet count 270 8K. Her TSH was normal. She was then referred to our cancer center for further evaluation.  She feels well in general, has no particular complaints. She denies any fever or chill, no night sweats, no weight loss in the past 5 years. She has macular degeneration, and she cannot see since very well. She denies any particular pain, cough, dyspnea, GI or other symptoms.  CURRENT THERAPY: Observation  INTERIM HISTORY Bethany Oconnor returns for follow-up. She has not been doing well. She found out her husband has cirrhosis of the liver and kidney failure. He has been in the hospital since Tuesday. Otherwise, she feels fine. Denies fever, night sweats, fatigue, or any other concerns.   MEDICAL HISTORY:  Past Medical History:  Diagnosis Date  . Cerebral aneurysm    s/p endovascular colling 2008  . Cervical cancer ([place])   . Diabetes mellitus    borderline  . Hearing loss   . Hypertension    mild  . Macular degeneration   . Neuropathy ([place])   . Paroxysmal atrial fibrillation ([place])     SURGICAL HISTORY: Past Surgical History:    Procedure Laterality Date  . CATARACT EXTRACTION     bilateral  . CERVICAL CONE BIOPSY    . THYROIDECTOMY      SOCIAL HISTORY: Social History   Social History  . Marital status: Married    Spouse name: N/A  . Number of children: 2  . Years of education: N/A   Occupational History  .  Retired   Social History Main Topics  . Smoking status: Former Smoker    Quit date: 09/02/1988  . Smokeless tobacco: Never Used  . Alcohol use Not on file     Comment: daily for 50 years   . Drug use: No  . Sexual activity: Not on file   Other Topics Concern  . Not on file   Social History Narrative  . No narrative on file    FAMILY HISTORY: Family History  Problem Relation Age of Onset  . Cancer Father 73    died  . Cerebral aneurysm Mother 53    died  . Heart attack Brother     ALLERGIES:  is allergic to codeine.  MEDICATIONS:  Current Outpatient Prescriptions  Medication Sig Dispense Refill  . beta carotene w/minerals (OCUVITE) tablet Take 1 tablet by mouth 2 (two) times daily.    . Calcium Carbonate-Vitamin D 600-400 MG-UNIT per tablet Take 1 tablet by mouth daily.    Marland Kitchen CARTIA XT 180 MG 24 hr capsule TAKE 1 CAPSULE BY MOUTH DAILY 90 capsule 3  . dabigatran (PRADAXA) 150 MG CAPS Take 1 capsule (150 mg total) by mouth every 12 (twelve)  hours. 180 capsule 3  . hydrochlorothiazide (HYDRODIURIL) 25 MG tablet Take 12.5 mg by mouth 2 (two) times a week. Take 12.5mg  every Monday and Thursday    . lisinopril (PRINIVIL,ZESTRIL) 10 MG tablet Take 10 mg by mouth daily.      Marland Kitchen LORazepam (ATIVAN) 1 MG tablet Take 1 mg by mouth at bedtime.      . metoprolol (TOPROL-XL) 50 MG 24 hr tablet Take 50 mg by mouth daily.     . pravastatin (PRAVACHOL) 40 MG tablet Take 40 mg by mouth daily as needed (cholesterol).     Marland Kitchen PREMPRO 0.625-5 MG per tablet Take 1 tablet by mouth daily.   3  . VIIBRYD 20 MG TABS Take 1 tablet by mouth daily.     No current facility-administered medications for this  visit.     REVIEW OF SYSTEMS:   Constitutional: Denies fevers, chills or abnormal night sweats Eyes: Denies blurriness of vision, double vision or watery eyes Ears, nose, mouth, throat, and face: Denies mucositis or sore throat Respiratory: Denies cough, dyspnea or wheezes Cardiovascular: Denies palpitation, chest discomfort or lower extremity swelling Gastrointestinal:  Denies nausea, heartburn or change in bowel habits Skin: Denies abnormal skin rashes Lymphatics: Denies new lymphadenopathy or easy bruising Neurological:Denies numbness, tingling or new weaknesses Behavioral/Psych: Mood is stable, no new changes (+) anxiety over husband's illnesses All other systems were reviewed with the patient and are negative.  PHYSICAL EXAMINATION: ECOG PERFORMANCE STATUS: 0 - Asymptomatic  Vitals:   10/10/16 1319  BP: (!) 153/57  Pulse: 75  Resp: 18  Temp: 97.9 F (36.6 C)   Filed Weights   10/10/16 1319  Weight: 175 lb 4.8 oz (79.5 kg)   GENERAL:alert, no distress and comfortable SKIN: skin color, texture, turgor are normal, no rashes or significant lesions EYES: normal, conjunctiva are pink and non-injected, sclera clear OROPHARYNX:no exudate, no erythema and lips, buccal mucosa, and tongue normal  NECK: supple, thyroid normal size, non-tender, without nodularity LYMPH:  no palpable lymphadenopathy in the cervical, axillary or inguinal LUNGS: clear to auscultation and percussion with normal breathing effort HEART: regular rate & rhythm and no murmurs and no lower extremity edema ABDOMEN:abdomen soft, non-tender and normal bowel sounds Musculoskeletal:no cyanosis of digits and no clubbing  PSYCH: alert & oriented x 3 with fluent speech NEURO: no focal motor/sensory deficits  LABORATORY DATA:  I have reviewed the data as listed CBC Latest Ref Rng & Units 10/10/2016 06/06/2016 03/14/2016  WBC 3.9 - 10.3 10e3/uL 21.5(H) 20.9(H) 15.8(H)  Hemoglobin 11.6 - 15.9 g/dL 13.5 14.0 13.9    Hematocrit 34.8 - 46.6 % 40.6 41.2 42.0  Platelets 145 - 400 10e3/uL 171 182 175    CMP Latest Ref Rng & Units 10/10/2016 12/07/2015 08/30/2015  Glucose 70 - 140 mg/dl 95 87 102  BUN 7.0 - 26.0 mg/dL 9.6 15.1 13.7  Creatinine 0.6 - 1.1 mg/dL 0.9 0.9 1.0  Sodium 136 - 145 mEq/L 141 141 141  Potassium 3.5 - 5.1 mEq/L 4.2 4.3 4.5  Chloride - - - -  CO2 22 - 29 mEq/L 24 24 24   Calcium 8.4 - 10.4 mg/dL 9.7 9.8 9.4  Total Protein 6.4 - 8.3 g/dL 6.7 6.9 6.7  Total Bilirubin 0.20 - 1.20 mg/dL 0.57 0.45 0.46  Alkaline Phos 40 - 150 U/L 55 42 46  AST 5 - 34 U/L 29 29 24   ALT 0 - 55 U/L 26 24 21      Flow cytometry 08/16/2014 Peripheral Blood Flow  Cytometry - ABNORMAL B-CELL POPULATION DETECTED, SEE COMMENT. Diagnosis Comment: There is an abnormal population of B-cells with expression of B-cell markers including CD23. CD20 expression is dim and light chain expression is too dim for accurate assessment of clonality. There is no significant expression of CD5 or CD10. Markers for hairy cell leukemia are negative. Review of the peripheral blood reveals a lymphocytosis with predominately small lymphocytes with coarse chromatin and scant cytoplasm. 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 The expression of CD23, dim CD20, dim light chains, and morphology are most suggestive of chronic lymphocytic leukemia, however, CD5 is negative which is rather uncommon. Correlation with cytogenetics may be of some utility.  RADIOGRAPHIC STUDIES: I have personally reviewed the radiological images as listed and agreed with the findings in the report.  CT chest, abdomen and pelvis on 09/06/2014 IMPRESSION: No evidence of mass, lymphadenopathy, or other acute findings.  Mild hepatic steatosis and colonic diverticulosis incidentally noted.  ASSESSMENT & PLAN:  76 y.o. Caucasian female with past medical history of brain aneurysm, hypertension, borderline diabetes, macular  degeneration, hearing loss, who was incidentally found to have leukocytosis on routine lab since 2015.  1. Chronic ascitic leukemia (CLL), stage 0  -I previously reviewed her flow cytometry results with her and her husband. This is most consistent with CLL. She only has mild lymphocytosis at 9.5K, no adenopathy or splenomegaly by physical exam and CAT scan. No anemia and thrombocytopenia. -She feels well without any symptoms. I recommend observation at this stage. I explained to her she has very early stage disease, and it could be a very intercurrent course and she may not treatment for long time.  The treatment indications include symptomatic CLL, advanced stage or rapid lymphocytes doubling time. -FISH panel includes del(17p), del(11q), trisomy 12, and del(13q) , beta-2 microglobulin normal, ZAP70 (-)  -She is clinically doing well, her absolute lymphocyte cell count has been trending up, but it has not doubled in the past 2 years, no anemia or thrombocytopenia. I reviewed her lab results with her. -No need for treatment at this point, we'll continue monitoring. -Follow-up her CBC and differential every 3 months   2. Hypertension and other comorbidity She will continue follow-up with her primary care physician Dr. Joylene Draft.  3. AF -she is on Pradaxa, follow-up with her cardiologist.  4. Immunizations -We discussed that CLLpatients has compromised immune system, I strongly encouraged her to keep her vaccination up-to-date -She has received flu shot and pneumonia [facility].  Plan -Labs every 3 months.  -I will see her back in 6 months with labs -CC: Dr. Joylene Draft   All questions were answered. The patient knows to call the clinic with any problems, questions or concerns.  I spent 15 minutes counseling the patient face to face. The total time spent in the appointment was 20 minutes and more than 50% was on counseling.  This document serves as a record of services  personally performed by Truitt Merle, MD. It was created on her behalf by Martinique Casey, a trained medical scribe. The creation of this record is based on the scribe's personal observations and the provider's statements to them. This document has been checked and approved by the attending provider.  I have reviewed the above documentation for accuracy and completeness and I agree with the above.   Truitt Merle, MD 10/10/2016 11:10 PM

## 2016-10-10 ENCOUNTER — Other Ambulatory Visit (HOSPITAL_BASED_OUTPATIENT_CLINIC_OR_DEPARTMENT_OTHER): Payer: Medicare Other

## 2016-10-10 ENCOUNTER — Ambulatory Visit (HOSPITAL_BASED_OUTPATIENT_CLINIC_OR_DEPARTMENT_OTHER): Payer: Medicare Other | Admitting: Hematology

## 2016-10-10 ENCOUNTER — Telehealth: Payer: Self-pay | Admitting: Hematology

## 2016-10-10 VITALS — BP 153/57 | HR 75 | Temp 97.9°F | Resp 18 | Ht 65.5 in | Wt 175.3 lb

## 2016-10-10 DIAGNOSIS — C9110 Chronic lymphocytic leukemia of B-cell type not having achieved remission: Secondary | ICD-10-CM

## 2016-10-10 DIAGNOSIS — I4891 Unspecified atrial fibrillation: Secondary | ICD-10-CM | POA: Diagnosis not present

## 2016-10-10 DIAGNOSIS — I10 Essential (primary) hypertension: Secondary | ICD-10-CM | POA: Diagnosis not present

## 2016-10-10 LAB — CBC WITH DIFFERENTIAL/PLATELET
BASO%: 0.1 % (ref 0.0–2.0)
Basophils Absolute: 0 10*3/uL (ref 0.0–0.1)
EOS%: 0.4 % (ref 0.0–7.0)
Eosinophils Absolute: 0.1 10*3/uL (ref 0.0–0.5)
HCT: 40.6 % (ref 34.8–46.6)
HGB: 13.5 g/dL (ref 11.6–15.9)
LYMPH%: 77.6 % — ABNORMAL HIGH (ref 14.0–49.7)
MCH: 33.6 pg (ref 25.1–34.0)
MCHC: 33.3 g/dL (ref 31.5–36.0)
MCV: 101 fL (ref 79.5–101.0)
MONO#: 0.9 10*3/uL (ref 0.1–0.9)
MONO%: 4.1 % (ref 0.0–14.0)
NEUT%: 17.8 % — ABNORMAL LOW (ref 38.4–76.8)
NEUTROS ABS: 3.8 10*3/uL (ref 1.5–6.5)
Platelets: 171 10*3/uL (ref 145–400)
RBC: 4.02 10*6/uL (ref 3.70–5.45)
RDW: 13.7 % (ref 11.2–14.5)
WBC: 21.5 10*3/uL — AB (ref 3.9–10.3)
lymph#: 16.7 10*3/uL — ABNORMAL HIGH (ref 0.9–3.3)

## 2016-10-10 LAB — COMPREHENSIVE METABOLIC PANEL
ALT: 26 U/L (ref 0–55)
AST: 29 U/L (ref 5–34)
Albumin: 3.9 g/dL (ref 3.5–5.0)
Alkaline Phosphatase: 55 U/L (ref 40–150)
Anion Gap: 9 mEq/L (ref 3–11)
BUN: 9.6 mg/dL (ref 7.0–26.0)
CO2: 24 mEq/L (ref 22–29)
Calcium: 9.7 mg/dL (ref 8.4–10.4)
Chloride: 109 mEq/L (ref 98–109)
Creatinine: 0.9 mg/dL (ref 0.6–1.1)
EGFR: 62 mL/min/{1.73_m2} — ABNORMAL LOW (ref >90)
Glucose: 95 mg/dl (ref 70–140)
POTASSIUM: 4.2 meq/L (ref 3.5–5.1)
SODIUM: 141 meq/L (ref 136–145)
TOTAL PROTEIN: 6.7 g/dL (ref 6.4–8.3)
Total Bilirubin: 0.57 mg/dL (ref 0.20–1.20)

## 2016-10-10 LAB — TECHNOLOGIST REVIEW

## 2016-10-10 NOTE — Telephone Encounter (Signed)
Pt confirmed and received avs °

## 2016-12-19 ENCOUNTER — Emergency Department (HOSPITAL_COMMUNITY): Payer: Medicare Other

## 2016-12-19 ENCOUNTER — Encounter (HOSPITAL_COMMUNITY): Payer: Self-pay | Admitting: Emergency Medicine

## 2016-12-19 ENCOUNTER — Emergency Department (HOSPITAL_COMMUNITY)
Admission: EM | Admit: 2016-12-19 | Discharge: 2016-12-20 | Disposition: A | Discharge status: Other Acute Inpatient Hospital | Payer: Medicare Other | Attending: Emergency Medicine | Admitting: Emergency Medicine

## 2016-12-19 DIAGNOSIS — Z87891 Personal history of nicotine dependence: Secondary | ICD-10-CM | POA: Diagnosis not present

## 2016-12-19 DIAGNOSIS — R4689 Other symptoms and signs involving appearance and behavior: Secondary | ICD-10-CM

## 2016-12-19 DIAGNOSIS — R4589 Other symptoms and signs involving emotional state: Secondary | ICD-10-CM | POA: Diagnosis not present

## 2016-12-19 DIAGNOSIS — Z8541 Personal history of malignant neoplasm of cervix uteri: Secondary | ICD-10-CM | POA: Diagnosis not present

## 2016-12-19 DIAGNOSIS — Z79899 Other long term (current) drug therapy: Secondary | ICD-10-CM | POA: Insufficient documentation

## 2016-12-19 DIAGNOSIS — E1140 Type 2 diabetes mellitus with diabetic neuropathy, unspecified: Secondary | ICD-10-CM | POA: Insufficient documentation

## 2016-12-19 DIAGNOSIS — N390 Urinary tract infection, site not specified: Secondary | ICD-10-CM | POA: Diagnosis not present

## 2016-12-19 DIAGNOSIS — F01518 Vascular dementia, unspecified severity, with other behavioral disturbance: Secondary | ICD-10-CM

## 2016-12-19 DIAGNOSIS — I10 Essential (primary) hypertension: Secondary | ICD-10-CM | POA: Diagnosis not present

## 2016-12-19 DIAGNOSIS — F0151 Vascular dementia with behavioral disturbance: Secondary | ICD-10-CM | POA: Diagnosis not present

## 2016-12-19 DIAGNOSIS — F918 Other conduct disorders: Secondary | ICD-10-CM | POA: Diagnosis not present

## 2016-12-19 DIAGNOSIS — F03918 Unspecified dementia, unspecified severity, with other behavioral disturbance: Secondary | ICD-10-CM | POA: Diagnosis present

## 2016-12-19 DIAGNOSIS — Z049 Encounter for examination and observation for unspecified reason: Secondary | ICD-10-CM

## 2016-12-19 DIAGNOSIS — F0391 Unspecified dementia with behavioral disturbance: Secondary | ICD-10-CM | POA: Diagnosis present

## 2016-12-19 LAB — URINALYSIS, ROUTINE W REFLEX MICROSCOPIC
Bilirubin Urine: NEGATIVE
GLUCOSE, UA: NEGATIVE mg/dL
KETONES UR: 5 mg/dL — AB
NITRITE: NEGATIVE
Protein, ur: 30 mg/dL — AB
Specific Gravity, Urine: 1.016 (ref 1.005–1.030)
pH: 5 (ref 5.0–8.0)

## 2016-12-19 LAB — RAPID URINE DRUG SCREEN, HOSP PERFORMED
Amphetamines: NOT DETECTED
Barbiturates: NOT DETECTED
Benzodiazepines: POSITIVE — AB
Cocaine: NOT DETECTED
Opiates: NOT DETECTED
Tetrahydrocannabinol: NOT DETECTED

## 2016-12-19 LAB — CBC WITH DIFFERENTIAL/PLATELET
Basophils Absolute: 0 K/uL (ref 0.0–0.1)
Basophils Relative: 0 %
Eosinophils Absolute: 0.2 K/uL (ref 0.0–0.7)
Eosinophils Relative: 1 %
HCT: 39.1 % (ref 36.0–46.0)
Hemoglobin: 13.3 g/dL (ref 12.0–15.0)
Lymphocytes Relative: 62 %
Lymphs Abs: 9.3 K/uL — ABNORMAL HIGH (ref 0.7–4.0)
MCH: 32.6 pg (ref 26.0–34.0)
MCHC: 34 g/dL (ref 30.0–36.0)
MCV: 95.8 fL (ref 78.0–100.0)
Monocytes Absolute: 0.8 K/uL (ref 0.1–1.0)
Monocytes Relative: 5 %
Neutro Abs: 4.9 K/uL (ref 1.7–7.7)
Neutrophils Relative %: 32 %
Platelets: 219 K/uL (ref 150–400)
RBC: 4.08 MIL/uL (ref 3.87–5.11)
RDW: 13.8 % (ref 11.5–15.5)
WBC: 15.2 K/uL — ABNORMAL HIGH (ref 4.0–10.5)

## 2016-12-19 LAB — COMPREHENSIVE METABOLIC PANEL WITH GFR
ALT: 20 U/L (ref 14–54)
AST: 38 U/L (ref 15–41)
Albumin: 3.7 g/dL (ref 3.5–5.0)
Alkaline Phosphatase: 54 U/L (ref 38–126)
Anion gap: 11 (ref 5–15)
BUN: 11 mg/dL (ref 6–20)
CO2: 20 mmol/L — ABNORMAL LOW (ref 22–32)
Calcium: 8.5 mg/dL — ABNORMAL LOW (ref 8.9–10.3)
Chloride: 112 mmol/L — ABNORMAL HIGH (ref 101–111)
Creatinine, Ser: 0.87 mg/dL (ref 0.44–1.00)
GFR calc Af Amer: >60 mL/min
GFR calc non Af Amer: >60 mL/min
Glucose, Bld: 113 mg/dL — ABNORMAL HIGH (ref 65–99)
Potassium: 4.1 mmol/L (ref 3.5–5.1)
Sodium: 143 mmol/L (ref 135–145)
Total Bilirubin: 0.8 mg/dL (ref 0.3–1.2)
Total Protein: 6.7 g/dL (ref 6.5–8.1)

## 2016-12-19 LAB — ETHANOL: Alcohol, Ethyl (B): <5 mg/dL

## 2016-12-19 LAB — PATHOLOGIST SMEAR REVIEW

## 2016-12-19 MED ORDER — ZIPRASIDONE MESYLATE 20 MG IM SOLR
10.0000 mg | Freq: Once | INTRAMUSCULAR | Status: AC
  Administered 2016-12-19: 10 mg via INTRAMUSCULAR
  Filled 2016-12-19: qty 20

## 2016-12-19 MED ORDER — DILTIAZEM HCL ER COATED BEADS 180 MG PO CP24
180.0000 mg | ORAL_CAPSULE | Freq: Every day | ORAL | Status: DC
  Administered 2016-12-19 – 2016-12-20 (×2): 180 mg via ORAL
  Filled 2016-12-19 (×2): qty 1

## 2016-12-19 MED ORDER — LISINOPRIL 10 MG PO TABS
10.0000 mg | ORAL_TABLET | Freq: Every day | ORAL | Status: DC
  Administered 2016-12-19 – 2016-12-20 (×2): 10 mg via ORAL
  Filled 2016-12-19 (×2): qty 1

## 2016-12-19 MED ORDER — CEPHALEXIN 500 MG PO CAPS
500.0000 mg | ORAL_CAPSULE | Freq: Four times a day (QID) | ORAL | Status: DC
  Administered 2016-12-19 – 2016-12-20 (×4): 500 mg via ORAL
  Filled 2016-12-19 (×4): qty 1

## 2016-12-19 MED ORDER — CALCIUM CARBONATE-VITAMIN D 600-400 MG-UNIT PO TABS: 1.0000 | ORAL_TABLET | Freq: Every day | ORAL | Status: DC

## 2016-12-19 MED ORDER — NICOTINE 21 MG/24HR TD PT24: 21.0000 mg | MEDICATED_PATCH | Freq: Every day | TRANSDERMAL | Status: DC | PRN

## 2016-12-19 MED ORDER — ALUM & MAG HYDROXIDE-SIMETH 200-200-20 MG/5ML PO SUSP: 30.0000 mL | ORAL | Status: DC | PRN

## 2016-12-19 MED ORDER — CALCIUM CARBONATE-VITAMIN D 500-200 MG-UNIT PO TABS
1.0000 | ORAL_TABLET | Freq: Every day | ORAL | Status: DC
  Administered 2016-12-19: 1 via ORAL
  Filled 2016-12-19: qty 1

## 2016-12-19 MED ORDER — LATANOPROST 0.005 % OP SOLN
1.0000 [drp] | Freq: Every day | OPHTHALMIC | Status: DC
  Administered 2016-12-19: 1 [drp] via OPHTHALMIC
  Filled 2016-12-19: qty 2.5

## 2016-12-19 MED ORDER — HALOPERIDOL LACTATE 5 MG/ML IJ SOLN
5.0000 mg | Freq: Once | INTRAMUSCULAR | Status: AC
  Administered 2016-12-19: 5 mg via INTRAMUSCULAR
  Filled 2016-12-19: qty 1

## 2016-12-19 MED ORDER — METOPROLOL SUCCINATE ER 50 MG PO TB24
50.0000 mg | ORAL_TABLET | Freq: Every day | ORAL | Status: DC
  Administered 2016-12-19 – 2016-12-20 (×2): 50 mg via ORAL
  Filled 2016-12-19 (×2): qty 1

## 2016-12-19 MED ORDER — MEDROXYPROGESTERONE ACETATE 5 MG PO TABS
5.0000 mg | ORAL_TABLET | Freq: Every day | ORAL | Status: DC
  Administered 2016-12-19 – 2016-12-20 (×2): 5 mg via ORAL
  Filled 2016-12-19 (×2): qty 1

## 2016-12-19 MED ORDER — PRAVASTATIN SODIUM 40 MG PO TABS
40.0000 mg | ORAL_TABLET | Freq: Every day | ORAL | Status: DC | PRN
  Filled 2016-12-19: qty 1

## 2016-12-19 MED ORDER — CONJ ESTROG-MEDROXYPROGEST ACE 0.625-5 MG PO TABS: 1.0000 | ORAL_TABLET | Freq: Every day | ORAL | Status: DC

## 2016-12-19 MED ORDER — HYDROCHLOROTHIAZIDE 25 MG PO TABS
12.5000 mg | ORAL_TABLET | ORAL | Status: DC
  Administered 2016-12-19: 12.5 mg via ORAL
  Filled 2016-12-19: qty 0.5

## 2016-12-19 MED ORDER — ACETAMINOPHEN 325 MG PO TABS: 650.0000 mg | ORAL_TABLET | ORAL | Status: DC | PRN

## 2016-12-19 MED ORDER — DIPHENHYDRAMINE HCL 50 MG/ML IJ SOLN
50.0000 mg | Freq: Once | INTRAMUSCULAR | Status: AC
  Administered 2016-12-19: 50 mg via INTRAMUSCULAR
  Filled 2016-12-19: qty 1

## 2016-12-19 MED ORDER — ESTROGENS CONJUGATED 0.625 MG PO TABS
0.6250 mg | ORAL_TABLET | Freq: Every day | ORAL | Status: DC
  Administered 2016-12-19 – 2016-12-20 (×2): 0.625 mg via ORAL
  Filled 2016-12-19 (×2): qty 1

## 2016-12-19 MED ORDER — ZOLPIDEM TARTRATE 5 MG PO TABS: 5.0000 mg | ORAL_TABLET | Freq: Every evening | ORAL | Status: DC | PRN

## 2016-12-19 MED ORDER — VILAZODONE HCL 40 MG PO TABS
40.0000 mg | ORAL_TABLET | Freq: Every day | ORAL | Status: DC
  Administered 2016-12-19 – 2016-12-20 (×2): 40 mg via ORAL
  Filled 2016-12-19 (×2): qty 1

## 2016-12-19 MED ORDER — LORAZEPAM 1 MG PO TABS: 1.0000 mg | ORAL_TABLET | Freq: Three times a day (TID) | ORAL | Status: DC | PRN

## 2016-12-19 MED ORDER — TETANUS-DIPHTH-ACELL PERTUSSIS 5-2.5-18.5 LF-MCG/0.5 IM SUSP
0.5000 mL | Freq: Once | INTRAMUSCULAR | Status: AC
  Administered 2016-12-19: 0.5 mL via INTRAMUSCULAR
  Filled 2016-12-19: qty 0.5

## 2016-12-19 MED ORDER — DABIGATRAN ETEXILATE MESYLATE 150 MG PO CAPS
150.0000 mg | ORAL_CAPSULE | Freq: Two times a day (BID) | ORAL | Status: DC
  Administered 2016-12-19 – 2016-12-20 (×2): 150 mg via ORAL
  Filled 2016-12-19 (×3): qty 1

## 2016-12-19 MED ORDER — ONDANSETRON HCL 4 MG PO TABS: 4.0000 mg | ORAL_TABLET | Freq: Three times a day (TID) | ORAL | Status: DC | PRN

## 2016-12-19 MED ORDER — OLANZAPINE 10 MG PO TABS
10.0000 mg | ORAL_TABLET | Freq: Every day | ORAL | Status: DC
  Administered 2016-12-19: 10 mg via ORAL
  Filled 2016-12-19: qty 1
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
## 2016-12-19 NOTE — ED Notes (Signed)
Pt given breakfast tray

## 2016-12-19 NOTE — ED Notes (Signed)
Halfway through blood draw by me pt began screaming help and saying "you're lucky you're not within striking distance".  I was only able to get a light green and lavender, not the dark green for an ethanol level.  Will make new attempt after Tdap and Geodon shots.
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
## 2016-12-19 NOTE — ED Notes (Signed)
SPOKE WITH THE SON ERIC RUTHERFORD, HE WAS GIVEN AN UPDATE ON THE PLANS FOR THE PT.
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
## 2016-12-19 NOTE — ED Provider Notes (Signed)
17: 30-I was asked by nursing to help with psychiatric holding orders.  Apparently the patient has been seen by TTS, and they are considering placement in a Geri psych facility.  The patient was seen earlier by ED providers, had screening evaluation testing done and was treated with antipsychotic medications for sedation.   Medications  haloperidol lactate (HALDOL) injection 5 mg (5 mg Intramuscular Given 12/19/16 0445)  diphenhydrAMINE (BENADRYL) injection 50 mg (50 mg Intramuscular Given 12/19/16 0445)  Tdap (BOOSTRIX) injection 0.5 mL (0.5 mLs Intramuscular Given 12/19/16 0702)  ziprasidone (GEODON) injection 10 mg (10 mg Intramuscular Given 12/19/16 0703)    Patient Vitals for the past 24 hrs:  BP Temp Temp src Pulse Resp SpO2 Height Weight  12/19/16 1734 (!) 152/88 - - (!) 113 16 95 % - -  12/19/16 1304 (!) 158/83 - - (!) 103 17 95 % - -  12/19/16 1150 - - - - - - 5\' 4"  (1.626 m) 175 lb (79.4 kg)  12/19/16 1052 (!) 160/87 - - 88 18 98 % - -  12/19/16 0704 (!) 142/87 - - 95 17 97 % - -  12/19/16 0303 (!) 126/94 97.9 F (36.6 C) Oral (!) 105 (!) 22 97 % - -   Results for orders placed or performed during the hospital encounter of 12/19/16  CBC with Differential  Result Value Ref Range   WBC 15.2 (H) 4.0 - 10.5 K/uL   RBC 4.08 3.87 - 5.11 MIL/uL   Hemoglobin 13.3 12.0 - 15.0 g/dL   HCT 39.1 36.0 - 46.0 %   MCV 95.8 78.0 - 100.0 fL   MCH 32.6 26.0 - 34.0 pg   MCHC 34.0 30.0 - 36.0 g/dL   RDW 13.8 11.5 - 15.5 %   Platelets 219 150 - 400 K/uL   Neutrophils Relative % 32 %   Lymphocytes Relative 62 %   Monocytes Relative 5 %   Eosinophils Relative 1 %   Basophils Relative 0 %   Neutro Abs 4.9 1.7 - 7.7 K/uL   Lymphs Abs 9.3 (H) 0.7 - 4.0 K/uL   Monocytes Absolute 0.8 0.1 - 1.0 K/uL   Eosinophils Absolute 0.2 0.0 - 0.7 K/uL   Basophils Absolute 0.0 0.0 - 0.1 K/uL   WBC Morphology ABSOLUTE LYMPHOCYTOSIS   Comprehensive metabolic panel  Result Value Ref Range   Sodium 143 135 -  145 mmol/L   Potassium 4.1 3.5 - 5.1 mmol/L   Chloride 112 (H) 101 - 111 mmol/L   CO2 20 (L) 22 - 32 mmol/L   Glucose, Bld 113 (H) 65 - 99 mg/dL   BUN 11 6 - 20 mg/dL   Creatinine, Ser 0.87 0.44 - 1.00 mg/dL   Calcium 8.5 (L) 8.9 - 10.3 mg/dL   Total Protein 6.7 6.5 - 8.1 g/dL   Albumin 3.7 3.5 - 5.0 g/dL   AST 38 15 - 41 U/L   ALT 20 14 - 54 U/L   Alkaline Phosphatase 54 38 - 126 U/L   Total Bilirubin 0.8 0.3 - 1.2 mg/dL   GFR calc non Af Amer >60 >60 mL/min   GFR calc Af Amer >60 >60 mL/min   Anion gap 11 5 - 15  Rapid urine drug screen (hospital performed)  Result Value Ref Range   Opiates NONE DETECTED NONE DETECTED   Cocaine NONE DETECTED NONE DETECTED   Benzodiazepines POSITIVE (A) NONE DETECTED   Amphetamines NONE DETECTED NONE DETECTED   Tetrahydrocannabinol NONE DETECTED NONE DETECTED  Barbiturates NONE DETECTED NONE DETECTED  Urinalysis, Routine w reflex microscopic  Result Value Ref Range   Color, Urine YELLOW YELLOW   APPearance HAZY (A) CLEAR   Specific Gravity, Urine 1.016 1.005 - 1.030   pH 5.0 5.0 - 8.0   Glucose, UA NEGATIVE NEGATIVE mg/dL   Hgb urine dipstick MODERATE (A) NEGATIVE   Bilirubin Urine NEGATIVE NEGATIVE   Ketones, ur 5 (A) NEGATIVE mg/dL   Protein, ur 30 (A) NEGATIVE mg/dL   Nitrite NEGATIVE NEGATIVE   Leukocytes, UA TRACE (A) NEGATIVE   RBC / HPF 6-30 0 - 5 RBC/hpf   WBC, UA 6-30 0 - 5 WBC/hpf   Bacteria, UA RARE (A) NONE SEEN   Squamous Epithelial / LPF 0-5 (A) NONE SEEN   Mucous PRESENT   Ethanol  Result Value Ref Range   Alcohol, Ethyl (B) <5 <5 mg/dL  Pathologist smear review  Result Value Ref Range   Path Review Reviewed By Violet Baldy, M.D.     Medical decision making-combative behavior, with dementia, and abnormal urinalysis.  Suspect UTI, contributing to [place] disorder.  Patient has been evaluated by TTS.  Will initiate antibiotic treatment, and continue to monitor.  Patient would be stable for treatment in a Geri  psych facility, for worsening behavioral problems associated with urinary tract infection.        Daleen Bo, MD 12/20/16 [phone]

## 2016-12-19 NOTE — BHH Counselor (Signed)
Clinician faxed pt's chest x-ray to [place], [place], [facility], [facility] Triage Specialist [phone]

## 2016-12-19 NOTE — BH Assessment (Signed)
[place] Assessment Progress Note  Per Corena Pilgrim, MD, this pt requires psychiatric hospitalization at this time.  The following facilities have been contacted to seek placement for this pt, with results as noted:  Beds available, information sent, decision pending:  Psychologist, counselling   At capacity:  [facility], [place] Triage Specialist [phone]

## 2016-12-19 NOTE — ED Notes (Signed)
Bed: WA29 Expected date:  Expected time:  Means of arrival:  Comments: 

## 2016-12-19 NOTE — ED Notes (Signed)
Pt given phone to call husband 

## 2016-12-19 NOTE — BHH Counselor (Signed)
[facility] received a call from [place] at [facility], requesting a chest X-ray for the pt. Clinician discussed request with Larene [place], RN. [place] fax number: [phone].      Edd Fabian, MS, [facility], [facility] Triage Specialist [phone]
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
## 2016-12-20 DIAGNOSIS — Z87891 Personal history of nicotine dependence: Secondary | ICD-10-CM

## 2016-12-20 DIAGNOSIS — F0391 Unspecified dementia with behavioral disturbance: Secondary | ICD-10-CM | POA: Diagnosis present

## 2016-12-20 DIAGNOSIS — R4589 Other symptoms and signs involving emotional state: Secondary | ICD-10-CM

## 2016-12-20 DIAGNOSIS — R4689 Other symptoms and signs involving appearance and behavior: Secondary | ICD-10-CM | POA: Insufficient documentation

## 2016-12-20 DIAGNOSIS — F0151 Vascular dementia with behavioral disturbance: Secondary | ICD-10-CM

## 2016-12-20 DIAGNOSIS — F03918 Unspecified dementia, unspecified severity, with other behavioral disturbance: Secondary | ICD-10-CM | POA: Diagnosis present

## 2016-12-20 LAB — URINE CULTURE: Culture: NO GROWTH

## 2016-12-20 NOTE — BH Assessment (Signed)
[place] Assessment Progress Note  Per Corena Pilgrim, MD, this pt requires psychiatric hospitalization at this time.  He also has determined that pt meets criteria for IVC, which he has initiated.  IVC documents have been faxed to [facility], and at [facility] confirms receipt.  As of this writing, service of Findings and Custody Order is pending.  At [place] calls from [facility] to report that pt has been accepted to their facility by Dr Geanie Kenning.  Dr Darleene Cleaver concurs with this decision.  Pt's nurse, Caren Griffins, has been notified, and agrees to call report to [phone].  Pt is to be transported via [facility].  Jalene Mullet, [place] Triage Specialist [phone]

## 2016-12-20 NOTE — BHH Counselor (Signed)
Clinician received a call from [place] at [facility] expressing that the pt is under review.    Edd Fabian, MS, [facility], [facility] Triage Specialist [phone]

## 2016-12-20 NOTE — ED Notes (Signed)
Sheriff reported they were very busy today and it would probably be a while before patient would be transported.  He reported he would call before he arrived.

## 2016-12-20 NOTE — ED Notes (Signed)
Report called to [place] unit, Zella Richer RN.  Sheriff called for transport.

## 2016-12-20 NOTE — ED Notes (Signed)
Patient wandering out of room from time to time.  She forgets she is in the hospital and needs redirection.  No aggressive behavior noted.

## 2016-12-20 NOTE — Consult Note (Signed)
[employer] Psychiatry Consult   Reason for Consult:  Aggression, dementia Referring Physician:  EDP Patient Identification: Bethany Oconnor MRN:  [id number] Principal Diagnosis: Dementia with behavioral disturbance Diagnosis:   Patient Active Problem List   Diagnosis Date Noted  . Dementia with behavioral disturbance [F03.91] 12/20/2016    Priority: High  . 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 HYPERTENSION, BENIGN [I10] 05/31/2010  . ATRIAL FIBRILLATION [I48.91] 05/31/2010    Total Time spent with patient: 45 minutes  Subjective:   Bethany Oconnor is a 76 y.o. female patient admitted with dementia with aggression.  HPI:  76 yo female who presented to the ED after severe agitation, barricading herself in her room at her facility.  Patient has very poor vision and hearing.  Her behaviors have gotten out of control.  Remains confused with wandering.  No suicidal/homicidal ideation or hallucinations on assessment.  Appropriate for geropsych for agitation management.  Past Psychiatric History: dementia  Risk to Self: Suicidal Ideation: No Suicidal Intent: No Is patient at risk for suicide?: No Suicidal Plan?: No Access to Means: No What has been your use of drugs/alcohol within the last 12 months?:  (patient denies ) How many times?:  (0) Other Self Harm Risks:  (no self harm risk) Triggers for Past Attempts: Other (Comment) (no triggers for past attempts ) Intentional Self Injurious Behavior: None Risk to Others: Homicidal Ideation: No Thoughts of Harm to Others: No Current Homicidal Intent: No Current Homicidal Plan: No Access to Homicidal Means: No Identified Victim:  (n/a) History of harm to others?: No Assessment of Violence: None Noted Violent Behavior Description:  (patient is calm and cooperative ) Does patient have access to weapons?: No Criminal Charges Pending?:  No Does patient have a court date: No Prior Inpatient Therapy: Prior Inpatient Therapy: No Prior Therapy Dates:  (n/a) Prior Therapy Facilty/Provider(s):  (n/a) Reason for Treatment:  (n/a) Prior Outpatient Therapy: Prior Outpatient Therapy: No Prior Therapy Dates:  (n/a) Prior Therapy Facilty/Provider(s):  (n/a) Reason for Treatment:  (n/a) Does patient have an ACCT team?: No Does patient have Intensive In-House Services?  : No Does patient have [facility] services? : No Does patient have P[facility] services?: No  Past Medical History:  Past Medical History:  Diagnosis Date  . Cerebral aneurysm    s/p endovascular colling 2008  . Cervical cancer ([place])   . Diabetes mellitus    borderline  . Hearing loss   . Hypertension    mild  . Macular degeneration   . Neuropathy   . Paroxysmal atrial fibrillation [facility])     Past Surgical History:  Procedure Laterality Date  . CATARACT EXTRACTION     bilateral  . CERVICAL CONE BIOPSY    . THYROIDECTOMY     Family History:  Family History  Problem Relation Age of Onset  . Cancer Father 44    died  . Cerebral aneurysm Mother 6    died  . Heart attack Brother    Family Psychiatric  History: unknown Social History:  History  Alcohol use Not on file    Comment: daily for 50 years      History  Drug Use No    Social History   Social History  . Marital status: Married    Spouse name: N/A  . Number of children: 2  . Years of education: N/A   Occupational History  .  Retired  Social History Main Topics  . Smoking status: Former Smoker    Quit date: 09/02/1988  . Smokeless tobacco: Never Used  . Alcohol use None     Comment: daily for 50 years   . Drug use: No  . Sexual activity: Not Asked   Other Topics Concern  . None   Social History Narrative  . None   Additional Social History:    Allergies:   Allergies  Allergen Reactions  . Codeine Other (See Comments)    About 50 years ago, took Codeine for scratched  cornea while pregnant, and it made her extremely sedated.    Labs:  Results for orders placed or performed during the hospital encounter of 12/19/16 (from the past 48 hour(s))  Rapid urine drug screen (hospital performed)     Status: Abnormal   Collection Time: 12/19/16  5:35 AM  Result Value Ref Range   Opiates NONE DETECTED NONE DETECTED   Cocaine NONE DETECTED NONE DETECTED   Benzodiazepines POSITIVE (A) NONE DETECTED   Amphetamines NONE DETECTED NONE DETECTED   Tetrahydrocannabinol NONE DETECTED NONE DETECTED   Barbiturates NONE DETECTED NONE DETECTED    Comment:        DRUG SCREEN FOR MEDICAL PURPOSES ONLY.  IF CONFIRMATION IS NEEDED FOR ANY PURPOSE, NOTIFY LAB WITHIN 5 DAYS.        LOWEST DETECTABLE LIMITS FOR URINE DRUG SCREEN Drug Class       Cutoff (ng/mL) Amphetamine      1000 Barbiturate      200 Benzodiazepine   093 Tricyclics       267 Opiates          300 Cocaine          300 THC              50   Urinalysis, Routine w reflex microscopic     Status: Abnormal   Collection Time: 12/19/16  5:35 AM  Result Value Ref Range   Color, Urine YELLOW YELLOW   APPearance HAZY (A) CLEAR   Specific Gravity, Urine 1.016 1.005 - 1.030   pH 5.0 5.0 - 8.0   Glucose, UA NEGATIVE NEGATIVE mg/dL   Hgb urine dipstick MODERATE (A) NEGATIVE   Bilirubin Urine NEGATIVE NEGATIVE   Ketones, ur 5 (A) NEGATIVE mg/dL   Protein, ur 30 (A) NEGATIVE mg/dL   Nitrite NEGATIVE NEGATIVE   Leukocytes, UA TRACE (A) NEGATIVE   RBC / HPF 6-30 0 - 5 RBC/hpf   WBC, UA 6-30 0 - 5 WBC/hpf   Bacteria, UA RARE (A) NONE SEEN   Squamous Epithelial / LPF 0-5 (A) NONE SEEN   Mucous PRESENT   Urine culture     Status: None   Collection Time: 12/19/16  5:35 AM  Result Value Ref Range   Specimen Description URINE, RANDOM    Special Requests NONE    Culture      NO GROWTH Performed at [facility], [address], [place], [place] [address]    Report Status 12/20/2016 FINAL   CBC with  Differential     Status: Abnormal   Collection Time: 12/19/16  6:40 AM  Result Value Ref Range   WBC 15.2 (H) 4.0 - 10.5 K/uL   RBC 4.08 3.87 - 5.11 MIL/uL   Hemoglobin 13.3 12.0 - 15.0 g/dL   HCT 39.1 36.0 - 46.0 %   MCV 95.8 78.0 - 100.0 fL   MCH 32.6 26.0 - 34.0 pg   MCHC  34.0 30.0 - 36.0 g/dL   RDW 13.8 11.5 - 15.5 %   Platelets 219 150 - 400 K/uL   Neutrophils Relative % 32 %   Lymphocytes Relative 62 %   Monocytes Relative 5 %   Eosinophils Relative 1 %   Basophils Relative 0 %   Neutro Abs 4.9 1.7 - 7.7 K/uL   Lymphs Abs 9.3 (H) 0.7 - 4.0 K/uL   Monocytes Absolute 0.8 0.1 - 1.0 K/uL   Eosinophils Absolute 0.2 0.0 - 0.7 K/uL   Basophils Absolute 0.0 0.0 - 0.1 K/uL   WBC Morphology ABSOLUTE LYMPHOCYTOSIS   Comprehensive metabolic panel     Status: Abnormal   Collection Time: 12/19/16  6:40 AM  Result Value Ref Range   Sodium 143 135 - 145 mmol/L   Potassium 4.1 3.5 - 5.1 mmol/L   Chloride 112 (H) 101 - 111 mmol/L   CO2 20 (L) 22 - 32 mmol/L   Glucose, Bld 113 (H) 65 - 99 mg/dL   BUN 11 6 - 20 mg/dL   Creatinine, Ser 0.87 0.44 - 1.00 mg/dL   Calcium 8.5 (L) 8.9 - 10.3 mg/dL   Total Protein 6.7 6.5 - 8.1 g/dL   Albumin 3.7 3.5 - 5.0 g/dL   AST 38 15 - 41 U/L   ALT 20 14 - 54 U/L   Alkaline Phosphatase 54 38 - 126 U/L   Total Bilirubin 0.8 0.3 - 1.2 mg/dL   GFR calc non Af Amer >60 >60 mL/min   GFR calc Af Amer >60 >60 mL/min    Comment: (NOTE) The eGFR has been calculated using the CKD EPI equation. This calculation has not been validated in all clinical situations. eGFR's persistently <60 mL/min signify possible Chronic Kidney Disease.    Anion gap 11 5 - 15  Pathologist smear review     Status: None   Collection Time: 12/19/16  6:40 AM  Result Value Ref Range   Path Review Reviewed By Violet Baldy, M.D.     Comment: 04.19.18 ATYPICAL        Absolute lymphocytosis. Suggest immunophenotyping if a new, persistent finding.          Ethanol     Status:  None   Collection Time: 12/19/16  9:35 AM  Result Value Ref Range   Alcohol, Ethyl (B) <5 <5 mg/dL    Comment:        LOWEST DETECTABLE LIMIT FOR SERUM ALCOHOL IS 5 mg/dL FOR MEDICAL PURPOSES ONLY     Current Facility-Administered Medications  Medication Dose Route Frequency Provider Last Rate Last Dose  . acetaminophen (TYLENOL) tablet 650 mg  650 mg Oral Q4H PRN Daleen Bo, MD      . alum & mag hydroxide-simeth (MAALOX/MYLANTA) 200-200-20 MG/5ML suspension 30 mL  30 mL Oral PRN Daleen Bo, MD      . calcium-vitamin D (OSCAL WITH D) 500-200 MG-UNIT per tablet 1 tablet  1 tablet Oral Q breakfast Daleen Bo, MD   1 tablet at 12/19/16 2049  . cephALEXin (KEFLEX) capsule 500 mg  500 mg Oral Q6H Daleen Bo, MD   500 mg at 12/20/16 5456  . dabigatran (PRADAXA) capsule 150 mg  150 mg Oral Q12H Daleen Bo, MD   150 mg at 12/20/16 0928  . diltiazem (CARDIZEM CD) 24 hr capsule 180 mg  180 mg Oral Daily Daleen Bo, MD   180 mg at 12/20/16 0927  . estrogens (conjugated) (PREMARIN) tablet 0.625 mg  0.625 mg Oral  Daily Daleen Bo, MD   0.625 mg at 12/20/16 1093   And  . medroxyPROGESTERone (PROVERA) tablet 5 mg  5 mg Oral Daily Daleen Bo, MD   5 mg at 12/20/16 2355  . hydrochlorothiazide (HYDRODIURIL) tablet 12.5 mg  12.5 mg Oral Once per day on Mon Thu Elliott Wentz, MD   12.5 mg at 12/19/16 2051  . latanoprost (XALATAN) 0.005 % ophthalmic solution 1 drop  1 drop Both Eyes QHS Daleen Bo, MD   1 drop at 12/19/16 2052  . lisinopril (PRINIVIL,ZESTRIL) tablet 10 mg  10 mg Oral Daily Daleen Bo, MD   10 mg at 12/20/16 0930  . metoprolol succinate (TOPROL-XL) 24 hr tablet 50 mg  50 mg Oral Daily Daleen Bo, MD   50 mg at 12/20/16 0929  . nicotine (NICODERM CQ - dosed in mg/24 hours) patch 21 mg  21 mg Transdermal Daily PRN Daleen Bo, MD      . OLANZapine (ZYPREXA) tablet 10 mg  10 mg Oral QHS Daleen Bo, MD   10 mg at 12/19/16 2050  . ondansetron (ZOFRAN) tablet 4  mg  4 mg Oral Q8H PRN Daleen Bo, MD      . pravastatin (PRAVACHOL) tablet 40 mg  40 mg Oral Daily PRN Daleen Bo, MD      . Vilazodone HCl (VIIBRYD) TABS 40 mg  40 mg Oral Daily Daleen Bo, MD   40 mg at 12/20/16 0930   Current Outpatient Prescriptions  Medication Sig Dispense Refill  . beta carotene w/minerals (OCUVITE) tablet Take 1 tablet by mouth 2 (two) times daily.    . Calcium Carbonate-Vitamin D 600-400 MG-UNIT per tablet Take 1 tablet by mouth daily.    Marland Kitchen CARTIA XT 180 MG 24 hr capsule TAKE 1 CAPSULE BY MOUTH DAILY 90 capsule 3  . dabigatran (PRADAXA) 150 MG CAPS Take 1 capsule (150 mg total) by mouth every 12 (twelve) hours. 180 capsule 3  . hydrochlorothiazide (HYDRODIURIL) 25 MG tablet Take 12.5 mg by mouth 2 (two) times a week. Take 12.'5mg'$  every Monday and Thursday    . latanoprost (XALATAN) 0.005 % ophthalmic solution Place 1 drop into both eyes at bedtime.    Marland Kitchen lisinopril (PRINIVIL,ZESTRIL) 10 MG tablet Take 10 mg by mouth daily.      Marland Kitchen LORazepam (ATIVAN) 1 MG tablet Take 1 mg by mouth 3 (three) times daily as needed for anxiety.     . metoprolol (TOPROL-XL) 50 MG 24 hr tablet Take 50 mg by mouth daily.     . Multiple Vitamin (MULTIVITAMIN WITH MINERALS) TABS tablet Take 1 tablet by mouth daily.    Marland Kitchen OLANZapine (ZYPREXA) 10 MG tablet Take 10 mg by mouth at bedtime.    . pravastatin (PRAVACHOL) 40 MG tablet Take 40 mg by mouth daily as needed (cholesterol).     Marland Kitchen PREMPRO 0.625-5 MG per tablet Take 1 tablet by mouth daily.   3  . Vilazodone HCl (VIIBRYD) 40 MG TABS Take 40 mg by mouth daily.    . nitrofurantoin (MACRODANTIN) 100 MG capsule Take 100 mg by mouth 2 (two) times daily.      Musculoskeletal: Strength & Muscle Tone: within normal limits Gait & Station: normal Patient leans: N/A  Psychiatric Specialty Exam: Physical Exam  Constitutional: She is oriented to person, place, and time. She appears well-developed and well-nourished.  HENT:  Head:  Normocephalic.  Neck: Normal range of motion.  Respiratory: Effort normal.  Musculoskeletal: Normal range of motion.  Neurological: She  is alert and oriented to person, place, and time.  Skin: Skin is warm and dry.  Psychiatric: Her speech is normal and behavior is normal. Her affect is labile. Thought content is delusional. Cognition and memory are impaired. She expresses impulsivity.    Review of Systems  Psychiatric/Behavioral: Positive for memory loss.  All other systems reviewed and are negative.   Blood pressure 115/74, pulse 83, temperature 98.3 F (36.8 C), temperature source Oral, resp. rate 20, height '5\' 4"'$  (1.626 m), weight 79.4 kg (175 lb), SpO2 97 %.Body mass index is 30.04 kg/m.  General Appearance: Casual  Eye Contact:  Good  Speech:  Normal Rate  Volume:  Normal  Mood:  Euthymic  Affect:  Congruent  Thought Process:  Coherent and Descriptions of Associations: Intact  Orientation:  Other:  self  Thought Content:  Delusions  Suicidal Thoughts:  No  Homicidal Thoughts:  No  Memory:  Immediate;   Fair Recent;   Fair Remote;   Fair  Judgement:  Impaired  Insight:  Lacking  Psychomotor Activity:  Normal  Concentration:  Concentration: Fair and Attention Span: Poor  Recall:  [employer] of Knowledge:  Fair  Language:  Good  Akathisia:  No  Handed:  Right  AIMS (if indicated):     Assets:  Housing Intimacy Leisure Time Physical Health Resilience Social Support  ADL's:  Intact  Cognition:  Impaired,  Moderate  Sleep:        Treatment Plan Summary: Daily contact with patient to assess and evaluate symptoms and progress in treatment, Medication management and Plan dementia with behavioral disturbance:  -Crisis stabilization -Medication management:  Continue medical medications along with Zyprexa 10 mg at bedtime for psychosis and Viibryd 40 mg daily for depression -Individual counseling  Disposition: Recommend psychiatric Inpatient admission when medically  cleared.  Waylan Boga, NP 12/20/2016 11:52 AM  Patient seen face-to-face for psychiatric evaluation, chart reviewed and case discussed with the physician extender and developed treatment plan. Reviewed the information documented and agree with the treatment plan. Corena Pilgrim, MD

## 2016-12-21 LAB — BASIC METABOLIC PANEL
BUN: 12 mg/dL (ref 4–21)
Creatinine: 0.9 mg/dL (ref 0.5–1.1)
Glucose: 113 mg/dL
Potassium: 4.3 mmol/L (ref 3.4–5.3)
SODIUM: 142 mmol/L (ref 137–147)

## 2016-12-21 LAB — CBC AND DIFFERENTIAL
HCT: 39 % (ref 36–46)
Hemoglobin: 12.7 g/dL (ref 12.0–16.0)
PLATELETS: 207 10*3/uL (ref 150–399)
WBC: 18.2 10^3/mL

## 2016-12-21 LAB — LIPID PANEL
CHOLESTEROL: 148 mg/dL (ref 0–200)
HDL: 39 mg/dL (ref 35–70)
LDL Cholesterol: 76 mg/dL
TRIGLYCERIDES: 165 mg/dL — AB (ref 40–160)

## 2016-12-21 LAB — HEPATIC FUNCTION PANEL
ALT: 28 U/L (ref 7–35)
AST: 53 U/L — AB (ref 13–35)
Alkaline Phosphatase: 57 U/L (ref 25–125)
BILIRUBIN, TOTAL: 0.6 mg/dL

## 2016-12-21 LAB — HEMOGLOBIN A1C: Hemoglobin A1C: 5.5

## 2016-12-21 LAB — TSH: TSH: 2.63 u[IU]/mL (ref 0.41–5.90)

## 2016-12-23 LAB — BASIC METABOLIC PANEL
BUN: 16 mg/dL (ref 4–21)
Creatinine: 0.9 mg/dL (ref 0.5–1.1)
GLUCOSE: 100 mg/dL
POTASSIUM: 4.6 mmol/L (ref 3.4–5.3)
Sodium: 143 mmol/L (ref 137–147)

## 2016-12-27 LAB — BASIC METABOLIC PANEL
BUN: 15 mg/dL (ref 4–21)
CREATININE: 1 mg/dL (ref 0.5–1.1)
Glucose: 95 mg/dL
Potassium: 4.3 mmol/L (ref 3.4–5.3)
Sodium: 143 mmol/L (ref 137–147)

## 2016-12-27 LAB — HEPATIC FUNCTION PANEL
ALT: 22 U/L (ref 7–35)
AST: 21 U/L (ref 13–35)
Alkaline Phosphatase: 56 U/L (ref 25–125)
Bilirubin, Total: 0.4 mg/dL

## 2016-12-27 LAB — CBC AND DIFFERENTIAL
HCT: 39 % (ref 36–46)
Hemoglobin: 12.7 g/dL (ref 12.0–16.0)
PLATELETS: 207 10*3/uL (ref 150–399)
WBC: 15.2 10*3/mL

## 2017-01-02 ENCOUNTER — Non-Acute Institutional Stay (SKILLED_NURSING_FACILITY): Payer: Medicare Other | Admitting: Internal Medicine

## 2017-01-02 ENCOUNTER — Encounter: Payer: Self-pay | Admitting: Internal Medicine

## 2017-01-02 DIAGNOSIS — R2681 Unsteadiness on feet: Secondary | ICD-10-CM

## 2017-01-02 DIAGNOSIS — F418 Other specified anxiety disorders: Secondary | ICD-10-CM | POA: Diagnosis not present

## 2017-01-02 DIAGNOSIS — G309 Alzheimer's disease, unspecified: Secondary | ICD-10-CM

## 2017-01-02 DIAGNOSIS — E785 Hyperlipidemia, unspecified: Secondary | ICD-10-CM

## 2017-01-02 DIAGNOSIS — F0281 Dementia in other diseases classified elsewhere with behavioral disturbance: Secondary | ICD-10-CM | POA: Diagnosis not present

## 2017-01-02 DIAGNOSIS — G4700 Insomnia, unspecified: Secondary | ICD-10-CM

## 2017-01-02 DIAGNOSIS — I4891 Unspecified atrial fibrillation: Secondary | ICD-10-CM | POA: Diagnosis not present

## 2017-01-02 DIAGNOSIS — R531 Weakness: Secondary | ICD-10-CM | POA: Diagnosis not present

## 2017-01-02 DIAGNOSIS — C9190 Lymphoid leukemia, unspecified not having achieved remission: Secondary | ICD-10-CM | POA: Diagnosis not present

## 2017-01-02 DIAGNOSIS — C9110 Chronic lymphocytic leukemia of B-cell type not having achieved remission: Secondary | ICD-10-CM
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
## 2017-01-07 ENCOUNTER — Other Ambulatory Visit: Payer: Medicare Other

## 2017-01-29 ENCOUNTER — Encounter: Payer: Self-pay | Admitting: Cardiovascular Disease

## 2017-01-31 ENCOUNTER — Other Ambulatory Visit: Payer: Self-pay | Admitting: Cardiovascular Disease

## 2017-02-19 ENCOUNTER — Ambulatory Visit: Payer: Medicare Other | Admitting: Cardiovascular Disease

## 2017-02-20 ENCOUNTER — Encounter: Payer: Self-pay | Admitting: Cardiovascular Disease

## 2017-04-06 ENCOUNTER — Telehealth: Payer: Self-pay

## 2017-04-06 NOTE — Telephone Encounter (Signed)
Called patients cell and the voicemail is full,called the home number and spoke with daughter? And she put down the new appt on the pts calendar

## 2017-04-08 ENCOUNTER — Other Ambulatory Visit: Payer: Medicare Other

## 2017-04-08 ENCOUNTER — Ambulatory Visit: Payer: Medicare Other | Admitting: Hematology
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
## 2017-04-25 ENCOUNTER — Other Ambulatory Visit (HOSPITAL_BASED_OUTPATIENT_CLINIC_OR_DEPARTMENT_OTHER): Payer: Medicare Other

## 2017-04-25 ENCOUNTER — Telehealth: Payer: Self-pay | Admitting: Hematology

## 2017-04-25 ENCOUNTER — Ambulatory Visit (HOSPITAL_BASED_OUTPATIENT_CLINIC_OR_DEPARTMENT_OTHER): Payer: Medicare Other | Admitting: Hematology

## 2017-04-25 ENCOUNTER — Encounter: Payer: Self-pay | Admitting: Hematology

## 2017-04-25 VITALS — BP 120/54 | HR 69 | Temp 97.7°F | Resp 17 | Ht 64.0 in | Wt 171.9 lb

## 2017-04-25 DIAGNOSIS — C9110 Chronic lymphocytic leukemia of B-cell type not having achieved remission: Secondary | ICD-10-CM

## 2017-04-25 DIAGNOSIS — I4891 Unspecified atrial fibrillation: Secondary | ICD-10-CM

## 2017-04-25 DIAGNOSIS — F329 Major depressive disorder, single episode, unspecified: Secondary | ICD-10-CM | POA: Diagnosis not present

## 2017-04-25 DIAGNOSIS — I10 Essential (primary) hypertension: Secondary | ICD-10-CM

## 2017-04-25 DIAGNOSIS — Z7901 Long term (current) use of anticoagulants: Secondary | ICD-10-CM | POA: Diagnosis not present

## 2017-04-25 LAB — CBC WITH DIFFERENTIAL/PLATELET
BASO%: 0.3 % (ref 0.0–2.0)
BASOS ABS: 0.1 10*3/uL (ref 0.0–0.1)
EOS ABS: 0.1 10*3/uL (ref 0.0–0.5)
EOS%: 0.6 % (ref 0.0–7.0)
HCT: 40 % (ref 34.8–46.6)
HGB: 13.2 g/dL (ref 11.6–15.9)
LYMPH%: 69.6 % — AB (ref 14.0–49.7)
MCH: 32.1 pg (ref 25.1–34.0)
MCHC: 33 g/dL (ref 31.5–36.0)
MCV: 97.3 fL (ref 79.5–101.0)
MONO#: 0.8 10*3/uL (ref 0.1–0.9)
MONO%: 4.4 % (ref 0.0–14.0)
NEUT#: 4.3 10*3/uL (ref 1.5–6.5)
NEUT%: 25.1 % — ABNORMAL LOW (ref 38.4–76.8)
PLATELETS: 196 10*3/uL (ref 145–400)
RBC: 4.11 10*6/uL (ref 3.70–5.45)
RDW: 14.4 % (ref 11.2–14.5)
WBC: 17.2 10*3/uL — ABNORMAL HIGH (ref 3.9–10.3)
lymph#: 12 10*3/uL — ABNORMAL HIGH (ref 0.9–3.3)

## 2017-04-25 LAB — COMPREHENSIVE METABOLIC PANEL
ALBUMIN: 3.3 g/dL — AB (ref 3.5–5.0)
ALK PHOS: 60 U/L (ref 40–150)
ALT: 14 U/L (ref 0–55)
ANION GAP: 7 meq/L (ref 3–11)
AST: 16 U/L (ref 5–34)
BUN: 13.9 mg/dL (ref 7.0–26.0)
CO2: 25 meq/L (ref 22–29)
Calcium: 9.4 mg/dL (ref 8.4–10.4)
Chloride: 109 mEq/L (ref 98–109)
Creatinine: 0.9 mg/dL (ref 0.6–1.1)
EGFR: 61 mL/min/{1.73_m2} — AB (ref >90)
GLUCOSE: 136 mg/dL (ref 70–140)
POTASSIUM: 3.8 meq/L (ref 3.5–5.1)
SODIUM: 141 meq/L (ref 136–145)
Total Bilirubin: 0.41 mg/dL (ref 0.20–1.20)
Total Protein: 6.7 g/dL (ref 6.4–8.3)

## 2017-04-25 LAB — TECHNOLOGIST REVIEW

## 2017-04-25 NOTE — Telephone Encounter (Signed)
Gave patient avs report and appointments for November 2018 and February 2019.

## 2017-06-09 ENCOUNTER — Ambulatory Visit (INDEPENDENT_AMBULATORY_CARE_PROVIDER_SITE_OTHER): Payer: Medicare Other | Admitting: Cardiovascular Disease

## 2017-06-09 ENCOUNTER — Encounter: Payer: Self-pay | Admitting: Cardiovascular Disease

## 2017-06-09 VITALS — BP 120/60 | HR 73 | Ht 65.0 in | Wt 170.1 lb

## 2017-06-09 DIAGNOSIS — I480 Paroxysmal atrial fibrillation: Secondary | ICD-10-CM

## 2017-06-09 NOTE — Progress Notes (Signed)
Cardiology Office Note Date:  06/09/2017   ID:  Gabreille, [place] Dec 23, 1940, MRN [id number]  PCP:  Crist Infante, MD  Cardiologist:  Sherren Mocha, MD    Chief Complaint  Patient presents with  . Follow-up    A Fib     History of Present Illness: Bethany Oconnor is a 76 y.o. female who presents for follow-up evaluation.   She's here with her caregiver today. Her husband, Wille Glaser, passed away in Feb 05, 2023. She had a lot of problems with depression since his death. She's seen a psychiatrist and has had trouble tolerating medications that have been prescribed. She couldn't tolerate seroquel. She's currently weaning off of Zoloft.   From a cardiac perspective she's doing ok. Today, she denies symptoms of chest pain, shortness of breath, orthopnea, PND, lower extremity edema, or syncope. She's had some palpitations. She complains of dizziness but no syncope.  She now has a 24 hour caregiver.    Past Medical History:  Diagnosis Date  . Cerebral aneurysm    s/p endovascular colling 2008  . Cervical cancer ([place])   . Diabetes mellitus    borderline  . Hearing loss   . Hypertension    mild  . Macular degeneration   . Neuropathy   . Paroxysmal atrial fibrillation [facility])     Past Surgical History:  Procedure Laterality Date  . CATARACT EXTRACTION     bilateral  . CERVICAL CONE BIOPSY    . THYROIDECTOMY      Current Outpatient Prescriptions  Medication Sig Dispense Refill  . acetaminophen (TYLENOL) 325 MG tablet Take 650 mg by mouth every 6 (six) hours as needed (pain).    Marland Kitchen beta carotene w/minerals (OCUVITE) tablet Take 1 tablet by mouth 2 (two) times daily.    . Calcium Carbonate-Vitamin D 600-400 MG-UNIT per tablet Take 1 tablet by mouth daily.    . dabigatran (PRADAXA) 150 MG CAPS Take 1 capsule (150 mg total) by mouth every 12 (twelve) hours. 180 capsule 3  . diltiazem (CARDIZEM CD) 180 MG 24 hr capsule Take 180 mg by mouth daily.    . diphenhydrAMINE (BENADRYL) 25 mg  capsule Take 25 mg by mouth every 6 (six) hours as needed for itching.    . donepezil (ARICEPT) 5 MG tablet Take 5 mg by mouth at bedtime.    . hydrochlorothiazide (HYDRODIURIL) 25 MG tablet Take 12.5 mg by mouth 2 (two) times a week. Take 12.5mg  every Monday and Thursday    . lamoTRIgine (LAMICTAL) 25 MG tablet Take 25 mg by mouth 2 (two) times daily.    Marland Kitchen lisinopril (PRINIVIL,ZESTRIL) 10 MG tablet Take 10 mg by mouth daily.      Marland Kitchen LORazepam (ATIVAN) 1 MG tablet Take 1 mg by mouth at bedtime.     . Melatonin 5 MG TABS Take 5 mg by mouth at bedtime.    . memantine (NAMENDA) 5 MG tablet Take 5 mg by mouth daily.    . metoprolol (TOPROL-XL) 50 MG 24 hr tablet Take 50 mg by mouth daily.     . Multiple Vitamins-Minerals (MULTIVITAMINS THER. W/MINERALS) TABS tablet Take 1 tablet by mouth daily.    . pravastatin (PRAVACHOL) 40 MG tablet Take 40 mg by mouth daily.     Marland Kitchen PREMPRO 0.625-5 MG per tablet Take 1 tablet by mouth daily.   3  . Probiotic Product (ALIGN) 4 MG CAPS Take 1 capsule by mouth daily.    . risperiDONE (RISPERDAL) 0.5 MG tablet Take  0.5 mg by mouth 2 (two) times daily.    . sertraline (ZOLOFT) 25 MG tablet Take 25 mg by mouth daily. For 7 days. Then take 12.5 mg for 7 days 06/13/17-06/19/17. Discontinue on 06/20/17.    Marland Kitchen traZODone (DESYREL) 50 MG tablet Take 50 mg by mouth at bedtime as needed for sleep.    . Vilazodone HCl 20 MG TABS Take 1 tablet by mouth daily.    Addison Lank Hazel (PREPARATION H EX) Apply 1 application topically 4 (four) times daily as needed (hemmoroids).     No current facility-administered medications for this visit.    Allergies:   Codeine   Social History:  The patient  reports that she quit smoking about 28 years ago. She has never used smokeless tobacco. She reports that she does not drink alcohol or use drugs.   Family History:  The patient's family history includes Cancer (age of onset: 70) in her father; Cerebral aneurysm (age of onset: 21) in her mother;  Heart attack in her brother.   ROS:  Please see the history of present illness.  Otherwise, review of systems is positive for chills, hearing loss, vision loss, diarrhea, depression, blood in urine, rash, dizziness, easy bruising, excessive sweating, excessive fatigue, palpitations, nausea, anxiety, difficulty urinating, balance problems, headaches.  All other systems are reviewed and negative.    PHYSICAL EXAM: VS:  BP 120/60   Pulse 73   Ht 5\' 5"  (1.651 m)   Wt 170 lb 1.9 oz (77.2 kg)   BMI 28.31 kg/m  , BMI Body mass index is 28.31 kg/m. GEN: Well nourished, well developed, pleasant elderly woman in no acute distress  HEENT: normal  Neck: no JVD, no masses. No carotid bruits Cardiac: RRR without murmur or gallop                Respiratory:  clear to auscultation bilaterally, normal work of breathing GI: soft, nontender, nondistended, + BS MS: no deformity or atrophy  Ext: no pretibial edema, pedal pulses 2+= bilaterally Skin: warm and dry, no rash Neuro:  Strength and sensation are intact Psych: euthymic mood, full affect  EKG:  EKG is ordered today. The ekg ordered today shows NSR 73 bpm, LPFB, otherwise within normal limits  Recent Labs: 12/21/2016: TSH 2.63 04/25/2017: ALT 14; BUN 13.9; Creatinine 0.9; HGB 13.2; Platelets 196; Potassium 3.8; Sodium 141   Lipid Panel     Component Value Date/Time   CHOL 148 12/21/2016   TRIG 165 (A) 12/21/2016   HDL 39 12/21/2016   LDLCALC 76 12/21/2016      Wt Readings from Last 3 Encounters:  06/09/17 170 lb 1.9 oz (77.2 kg)  04/25/17 171 lb 14.4 oz (78 kg)  12/19/16 175 lb (79.4 kg)     Cardiac Studies Reviewed: Echo 04-17-2016: Study Conclusions  - Left ventricle: The cavity size was normal. There was mild   concentric hypertrophy. Systolic function was normal. The   estimated ejection fraction was in the range of 50% to 55%. Wall   motion was normal; there were no regional wall motion   abnormalities. Doppler parameters  are consistent with abnormal   left ventricular relaxation (grade 1 diastolic dysfunction).   There was no evidence of elevated ventricular filling pressure by   Doppler parameters. - Aortic valve: Trileaflet; normal thickness leaflets. There was   mild regurgitation. - Aortic root: The aortic root was normal in size. - Ascending aorta: The ascending aorta was normal in size. - Mitral valve:  Structurally normal valve. There was trivial   regurgitation. - Left atrium: The atrium was normal in size. - Right ventricle: The cavity size was normal. Wall thickness was   normal. Systolic function was normal. - Right atrium: The atrium was normal in size. - Tricuspid valve: There was trivial regurgitation. - Pulmonic valve: There was no regurgitation. - Pulmonary arteries: Systolic pressure was within the normal   range. - Inferior vena cava: The vessel was normal in size. - Pericardium, extracardiac: There was no pericardial effusion.  ASSESSMENT AND PLAN: 1.  Paroxysmal atrial fibrillation: appears to be maintaining sinus rhythm. Tolerating dabigatran for anticoagulation. She's noted microscopic hematuria and had a recent UTI. She understands that chronic anticoagulation will predispose her to hematuria in the setting of UTI. Advised her to touch base if this becomes more problematic. She is to undergo urologic evaluation later this week.   This patients CHA2DS2-VASc Score and unadjusted Ischemic Stroke Rate (% per year) is equal to 4.8 % stroke rate/year from a score of 4  Above score calculated as 1 point each if present [CHF, HTN, DM, Vascular=MI/PAD/Aortic Plaque, Age if 65-74, or Female] Above score calculated as 2 points each if present [Age > 75, or Stroke/TIA/TE]  2. Essential HTN: BP controlled on current Rx. Medications reviewed today.   3. ASD, secundum type: small defect no evidence of RV volume overload on serial echo studies, asymptomatic.  Current medicines are reviewed  with the patient today.  The patient does not have concerns regarding medicines.  Labs/ tests ordered today include:  No orders of the defined types were placed in this encounter.  Disposition:   FU 6 months  Signed, Sherren Mocha, MD  06/09/2017 6:07 PM    [address], [place], [place]  [address] Phone: [phone]; Fax: [phone]

## 2017-06-09 NOTE — Patient Instructions (Signed)

## 2017-06-11 ENCOUNTER — Other Ambulatory Visit: Payer: Self-pay

## 2017-06-11 DIAGNOSIS — I480 Paroxysmal atrial fibrillation: Secondary | ICD-10-CM

## 2017-06-16 ENCOUNTER — Emergency Department (HOSPITAL_COMMUNITY)
Admission: EM | Admit: 2017-06-16 | Discharge: 2017-06-17 | Disposition: A | Discharge status: Home / Self Care | Payer: Medicare Other | Attending: Emergency Medicine | Admitting: Emergency Medicine

## 2017-06-16 ENCOUNTER — Emergency Department (HOSPITAL_COMMUNITY): Payer: Medicare Other

## 2017-06-16 ENCOUNTER — Encounter (HOSPITAL_COMMUNITY): Payer: Self-pay | Admitting: Emergency Medicine

## 2017-06-16 DIAGNOSIS — I480 Paroxysmal atrial fibrillation: Secondary | ICD-10-CM | POA: Insufficient documentation

## 2017-06-16 DIAGNOSIS — I10 Essential (primary) hypertension: Secondary | ICD-10-CM | POA: Insufficient documentation

## 2017-06-16 DIAGNOSIS — Z87891 Personal history of nicotine dependence: Secondary | ICD-10-CM | POA: Diagnosis not present

## 2017-06-16 DIAGNOSIS — Z8541 Personal history of malignant neoplasm of cervix uteri: Secondary | ICD-10-CM | POA: Diagnosis not present

## 2017-06-16 DIAGNOSIS — R531 Weakness: Secondary | ICD-10-CM | POA: Diagnosis not present

## 2017-06-16 DIAGNOSIS — Z79899 Other long term (current) drug therapy: Secondary | ICD-10-CM | POA: Diagnosis not present

## 2017-06-16 DIAGNOSIS — R42 Dizziness and giddiness: Secondary | ICD-10-CM | POA: Insufficient documentation

## 2017-06-16 DIAGNOSIS — E119 Type 2 diabetes mellitus without complications: Secondary | ICD-10-CM | POA: Diagnosis not present

## 2017-06-16 LAB — URINALYSIS, ROUTINE W REFLEX MICROSCOPIC
BILIRUBIN URINE: NEGATIVE
Glucose, UA: NEGATIVE mg/dL
Ketones, ur: NEGATIVE mg/dL
Nitrite: POSITIVE — AB
PH: 5 (ref 5.0–8.0)
Protein, ur: NEGATIVE mg/dL
SPECIFIC GRAVITY, URINE: 1.013 (ref 1.005–1.030)
Squamous Epithelial / LPF: NONE SEEN

## 2017-06-16 LAB — CBC
HCT: 39.5 % (ref 36.0–46.0)
Hemoglobin: 13.5 g/dL (ref 12.0–15.0)
MCH: 32.5 pg (ref 26.0–34.0)
MCHC: 34.2 g/dL (ref 30.0–36.0)
MCV: 95 fL (ref 78.0–100.0)
PLATELETS: 178 10*3/uL (ref 150–400)
RBC: 4.16 MIL/uL (ref 3.87–5.11)
RDW: 14.6 % (ref 11.5–15.5)
WBC: 23.1 10*3/uL — ABNORMAL HIGH (ref 4.0–10.5)

## 2017-06-16 LAB — CBG MONITORING, ED: Glucose-Capillary: 150 mg/dL — ABNORMAL HIGH (ref 65–99)

## 2017-06-16 LAB — BASIC METABOLIC PANEL
Anion gap: 10 (ref 5–15)
BUN: 11 mg/dL (ref 6–20)
CALCIUM: 8.9 mg/dL (ref 8.9–10.3)
CHLORIDE: 107 mmol/L (ref 101–111)
CO2: 23 mmol/L (ref 22–32)
CREATININE: 0.89 mg/dL (ref 0.44–1.00)
GFR calc Af Amer: >60 mL/min (ref >60)
GFR calc non Af Amer: >60 mL/min (ref >60)
Glucose, Bld: 97 mg/dL (ref 65–99)
Potassium: 4 mmol/L (ref 3.5–5.1)
Sodium: 140 mmol/L (ref 135–145)

## 2017-06-16 LAB — I-STAT TROPONIN, ED: Troponin i, poc: 0.01 ng/mL (ref 0.00–0.08)

## 2017-06-16 MED ORDER — DEXTROSE 5 % IV SOLN
1.0000 g | Freq: Once | INTRAVENOUS | Status: AC
  Administered 2017-06-16: 1 g via INTRAVENOUS
  Filled 2017-06-16: qty 10

## 2017-06-16 NOTE — ED Provider Notes (Signed)
[facility] DEPT Provider Note   CSN: [id number] Arrival date & time: 06/16/17  1425     History   Chief Complaint Chief Complaint  Patient presents with  . Dizziness    HPI Bethany Oconnor is a 76 y.o. female.  HPI 76 year old Caucasian female past medical history significant for diabetes, hypertension, macular degeneration, paroxysmal atrial fibrillation currently anticoagulation, dementia, CLL currently followed by oncology that presents to the emergency department today with complaints of ongoing dizziness, blurry vision, weakness, balance issues. Patient states that she was started on Zoloft August 2018 after her husband passed away for depression. The patient states that 05/13/2017 her dose was doubled. Since that time she has been feeling her symptoms. Patient is concerned that this may be related to her symptoms. She has seen her primary care doctor for her symptoms. Currently weaning her off the Zoloft. She is suppose to be taking her last does this week. Patient states that her symptoms are persistent. She describes the dizziness as a lightheadedness and tunnel vision. Denies room spinning sensation. Nothing makes her symptoms better or worse. She also reports persistent blurry vision along with ataxia that she says is secondary to the blurry vision. Patient also reports a rash that is pruritic nature to her chest and back that started 2 days ago. She states that she cannot take her symptoms anymore this evening so she asked the nurse she can be sent to the ED for evaluation. Patient denies any associated headache, chest pain, shortness of breath, abdominal pain, nausea, emesis, neck pain, back pain. Denies any urinary symptoms. She does report having a history of hematuria that is followed by [facility].   Pt denies any fever, chill, ha, congestion, neck pain, cp, sob, cough, abd pain, n/v/d, urinary symptoms, change in bowel habits, melena,  hematochezia, lower extremity paresthesias.  Past Medical History:  Diagnosis Date  . Cerebral aneurysm    s/p endovascular colling 2008  . Cervical cancer ([place])   . Diabetes mellitus    borderline  . Hearing loss   . Hypertension    mild  . Macular degeneration   . Neuropathy   . Paroxysmal atrial fibrillation [facility])     Patient Active Problem List   Diagnosis Date Noted  . Dementia with behavioral disturbance 12/20/2016  . Aggressive behavior   . CLL (chronic lymphocytic leukemia) ([employer]) 09/14/2014  . 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 HYPERTENSION, BENIGN 05/31/2010  . ATRIAL FIBRILLATION 05/31/2010    Past Surgical History:  Procedure Laterality Date  . CATARACT EXTRACTION     bilateral  . CERVICAL CONE BIOPSY    . THYROIDECTOMY      OB History    No data available       Home Medications    Prior to Admission medications   Medication Sig Start Date End Date Taking? Authorizing Provider  acetaminophen (TYLENOL) 325 MG tablet Take 650 mg by mouth every 6 (six) hours as needed (pain).    [provider]  beta carotene w/minerals (OCUVITE) tablet Take 1 tablet by mouth 2 (two) times daily. 08/19/13   Sherren Mocha, MD  Calcium Carbonate-Vitamin D 600-400 MG-UNIT per tablet Take 1 tablet by mouth daily. 08/19/13   Sherren Mocha, MD  dabigatran (PRADAXA) 150 MG CAPS Take 1 capsule (150 mg total) by mouth every 12 (twelve) hours. 04/07/12   Sherren Mocha, MD  diltiazem (CARDIZEM CD) 180 MG 24 hr capsule Take 180 mg by mouth  daily.    [provider]  diphenhydrAMINE (BENADRYL) 25 mg capsule Take 25 mg by mouth every 6 (six) hours as needed for itching.    [provider]  donepezil (ARICEPT) 5 MG tablet Take 5 mg by mouth at bedtime.    [provider]  hydrochlorothiazide (HYDRODIURIL) 25 MG tablet Take 12.5 mg by mouth 2 (two) times a week. Take 12.5mg  every Monday and Thursday 02/10/13   Sherren Mocha, MD    lamoTRIgine (LAMICTAL) 25 MG tablet Take 25 mg by mouth 2 (two) times daily. 06/04/17   [provider]  lisinopril (PRINIVIL,ZESTRIL) 10 MG tablet Take 10 mg by mouth daily.      [provider]  LORazepam (ATIVAN) 1 MG tablet Take 1 mg by mouth at bedtime.     [provider]  Melatonin 5 MG TABS Take 5 mg by mouth at bedtime.    [provider]  memantine (NAMENDA) 5 MG tablet Take 5 mg by mouth daily.    [provider]  metoprolol (TOPROL-XL) 50 MG 24 hr tablet Take 50 mg by mouth daily.     [provider]  Multiple Vitamins-Minerals (MULTIVITAMINS THER. W/MINERALS) TABS tablet Take 1 tablet by mouth daily.    [provider]  pravastatin (PRAVACHOL) 40 MG tablet Take 40 mg by mouth daily.  02/10/13   Sherren Mocha, MD  PREMPRO 0.625-5 MG per tablet Take 1 tablet by mouth daily.  08/22/14   [provider]  Probiotic Product (ALIGN) 4 MG CAPS Take 1 capsule by mouth daily.    [provider]  risperiDONE (RISPERDAL) 0.5 MG tablet Take 0.5 mg by mouth 2 (two) times daily.    [provider]  sertraline (ZOLOFT) 25 MG tablet Take 25 mg by mouth daily. For 7 days. Then take 12.5 mg for 7 days 06/13/17-06/19/17. Discontinue on 06/20/17.    [provider]  traZODone (DESYREL) 50 MG tablet Take 50 mg by mouth at bedtime as needed for sleep.    [provider]  Vilazodone HCl 20 MG TABS Take 1 tablet by mouth daily.    [provider]  Verlee Monte (PREPARATION H EX) Apply 1 application topically 4 (four) times daily as needed (hemmoroids).    [provider]    Family History Family History  Problem Relation Age of Onset  . Cancer Father 39       died  . Cerebral aneurysm Mother 67       died  . Heart attack Brother     Social History Social History  Substance Use Topics  . Smoking status: Former Smoker    Quit date: 09/02/1988  . Smokeless tobacco: Never Used   . Alcohol use No     Comment: daily for 50 years      Allergies   Codeine   Review of Systems Review of Systems  Constitutional: Negative for chills and fever.  HENT: Negative for congestion.   Eyes: Positive for visual disturbance (blurry vision).  Respiratory: Negative for cough and shortness of breath.   Cardiovascular: Negative for chest pain, palpitations and leg swelling.  Gastrointestinal: Negative for abdominal pain, diarrhea, nausea and vomiting.  Genitourinary: Negative for dysuria, flank pain, frequency, hematuria, urgency, vaginal bleeding and vaginal discharge.  Musculoskeletal: Negative for arthralgias and myalgias.  Skin: Negative for rash.  Neurological: Positive for dizziness and light-headedness. Negative for syncope, facial asymmetry, speech difficulty, weakness, numbness and headaches.  Psychiatric/Behavioral: Negative for sleep disturbance.  The patient is not nervous/anxious.      Physical Exam Updated Vital Signs BP 133/90   Pulse 70   Temp 97.8 F (36.6 C) (Oral)   Resp 17   Ht 5\' 5"  (1.651 m)   Wt 77.1 kg (170 lb)   SpO2 95%   BMI 28.29 kg/m   Physical Exam  Constitutional: She is oriented to person, place, and time. She appears well-developed and well-nourished.  Non-toxic appearance. No distress.  HENT:  Head: Normocephalic and atraumatic.  Nose: Nose normal.  Mouth/Throat: Oropharynx is clear and moist.  Eyes: Pupils are equal, round, and reactive to light. Conjunctivae and EOM are normal. Right eye exhibits no discharge. Left eye exhibits no discharge.  Neck: Normal range of motion. Neck supple.  Cardiovascular: Normal rate, regular rhythm, normal heart sounds and intact distal pulses.  Exam reveals no gallop and no friction rub.   No murmur heard. Pulmonary/Chest: Effort normal and breath sounds normal. No respiratory distress. She has no wheezes. She has no rales. She exhibits no tenderness.  Abdominal: Soft. Bowel sounds are normal.  There is no tenderness. There is no rebound and no guarding.  Musculoskeletal: Normal range of motion. She exhibits no tenderness.  Lymphadenopathy:    She has no cervical adenopathy.  Neurological: She is alert and oriented to person, place, and time.  The patient is alert, attentive, and oriented x 3. Speech is clear. Cranial nerve II-VII grossly intact. Negative pronator drift. Sensation intact. Strength 5/5 in all extremities. Reflexes 2+ and symmetric at biceps, triceps, knees, and ankles. Rapid alternating movement and fine finger movements intact. Romberg and gait not assessed at this time due to weakness   Skin: Skin is warm and dry. Capillary refill takes less than 2 seconds.  Erythematous pruritic rash to the chest and back. No vesicular lesions noted.  Psychiatric: Her behavior is normal. Judgment and thought content normal.  Nursing note and vitals reviewed.    ED Treatments / Results  Labs (all labs ordered are listed, but only abnormal results are displayed) Labs Reviewed  CBC - Abnormal; Notable for the following:       Result Value   WBC 23.1 (*)    All other components within normal limits  URINALYSIS, ROUTINE W REFLEX MICROSCOPIC - Abnormal; Notable for the following:    Hgb urine dipstick MODERATE (*)    Nitrite POSITIVE (*)    Leukocytes, UA TRACE (*)    Bacteria, UA MANY (*)    All other components within normal limits  CBG MONITORING, ED - Abnormal; Notable for the following:    Glucose-Capillary 150 (*)    All other components within normal limits  URINE CULTURE  BASIC METABOLIC PANEL  I-STAT TROPONIN, ED    EKG  EKG Interpretation  Date/Time:  Monday June 16 2017 14:55:35 EDT Ventricular Rate:  76 PR Interval:    QRS Duration: 114 QT Interval:  409 QTC Calculation: 460 R Axis:   -159 Text Interpretation:  Sinus rhythm Borderline intraventricular conduction delay Low voltage, precordial leads When compared with ECG of 12/20/2016, No significant  change was found Confirmed by Delora Fuel ([id number]) on 06/16/2017 5:01:07 PM       Radiology Dg Chest 2 View  Result Date: 06/16/2017 CLINICAL DATA:  Dizziness, lightheadedness and weakness EXAM: CHEST  2 VIEW COMPARISON:  12/19/2016 FINDINGS: The heart size and mediastinal contours are within normal limits. No pneumonic consolidation, effusion or pneumothorax. Mild degenerative changes are seen along the upper thoracic  spine. IMPRESSION: No active cardiopulmonary disease. Electronically Signed   By: Ashley Royalty M.D.   On: 06/16/2017 23:07   Ct Head Wo Contrast  Result Date: 06/16/2017 CLINICAL DATA:  Dizziness, lightheadedness, and weakness after medication does changes. EXAM: CT HEAD WITHOUT CONTRAST TECHNIQUE: Contiguous axial images were obtained from the base of the skull through the vertex without intravenous contrast. COMPARISON:  12/19/2016 FINDINGS: Brain: Diffuse cerebral atrophy. Ventricular dilatation likely due to central atrophy. Low-attenuation changes throughout the deep white matter likely representing small vessel ischemic change. No mass effect or midline shift. No abnormal extra-axial fluid collections. Gray-white matter junctions are distinct. Basal cisterns are not effaced. No acute intracranial hemorrhage. Vascular: Metallic coils in the suprasellar region consistent with previous aneurysm coils. Skull: The calvarium appears intact. Sinuses/Orbits: Paranasal sinuses and mastoid air cells are clear. Other: None. IMPRESSION: No acute intracranial abnormalities. Chronic atrophy and small vessel ischemic changes. Old aneurysm coiling in the suprasellar region. Electronically Signed   By: Lucienne Capers M.D.   On: 06/16/2017 23:01    Procedures Procedures (including critical care time)  Medications Ordered in ED Medications  cefTRIAXone (ROCEPHIN) 1 g in dextrose 5 % 50 mL IVPB (0 g Intravenous Stopped 06/17/17 0025)     Initial Impression / Assessment and Plan / ED  Course  I have reviewed the triage vital signs and the nursing notes.  Pertinent labs & imaging results that were available during my care of the patient were reviewed by me and considered in my medical decision making (see chart for details).     Patient presents to the ED with complaints of ongoing dizziness, lightheadedness, intermittent blurry vision after doubling her Zoloft approximately one month ago. Patient has been seen her primary care doctor for same symptoms and is currently being weaned off of her antidepressant. Patient states that she has not been able to take this today and came to the ED for evaluation.  Patient is overall well-appearing and nontoxic. Vital signs are reassuring.  Patient has no focal neuro deficit on exam. Abdomen is nontender to palpation. Lungs clear to auscultation. Orthostatics were unremarkable.  The patient does have a leukocytosis of 23,000 however patient has a history of CLL and this appears to be chronic and stable for patient mildly elevated today. She is followed by oncology with recent visit. BMP reassuring. Troponin is negative. EKG shows no ischemic changes. Chest x-ray is unremarkable. UA does show signs of urinary tract infection. We'll treat with IV antibiotics and sent home on oral antibiotics. This may be causing some of patient's symptoms however the patient states she recently saw her urologist and had a normal urine. The symptoms have been ongoing for the past month. Pt does reports some urinary urgency. Pt is afebrile. No tachycardia or hypotension. Does not meet SIRS or SEPSIS. Doubt urosepsis.   CT scan of head was ordered that was unremarkable.  Patient denies any chest pain or shortness of breath. Presentation does not seem consistent with PE or ACS.  Patient has follow-up with her primary care doctor in 2 days. Encourage patient to continue weaning herself off of the antidepressant as this may be causing her symptoms given that her  symptoms started after her does was doubled. Pt does report a rash that seems non specific. No signs of difficulties breathing or swallowing. Managing secretions and tolerating airway. Encourage patient to use Benadryl for the itching. Not consistent with Stevens-Johnson syndrome.  Pt is hemodynamically stable, in NAD, & able to  ambulate in the ED. Evaluation does not show pathology that would require ongoing emergent intervention or inpatient treatment. I explained the diagnosis to the patient. Pain has been managed & has no complaints prior to dc. Pt is comfortable with above plan and is stable for discharge at this time. All questions were answered prior to disposition. Strict return precautions for f/u to the ED were discussed. Encouraged follow up with PCP.  Pt seen and eval by Dr. Roderic Palau who is agreeable to the above plan.  Final Clinical Impressions(s) / ED Diagnoses   Final diagnoses:  Dizziness  Weakness    New Prescriptions Discharge Medication List as of 06/17/2017 12:33 AM    START taking these medications   Details  cephALEXin (KEFLEX) 500 MG capsule Take 1 capsule (500 mg total) by mouth 2 (two) times daily., Starting Tue 06/17/2017, Print         Doristine Devoid, PA-C 06/17/17 Luevenia Maxin    Milton Ferguson, MD 06/19/17 [phone]

## 2017-06-16 NOTE — ED Notes (Signed)
Tried to obtain a urine sample, Pt missed the Hat. Will try again, Pt aware a sample is needed.

## 2017-06-16 NOTE — ED Notes (Signed)
Pt ambulate from the bed to the door and back to the bed with-out and help

## 2017-06-16 NOTE — ED Triage Notes (Signed)
Per EMS pt lives independently at [facility]; recently had zoloft dose change; complaint of dizziness, lightheadedness, and weakness.

## 2017-06-16 NOTE — ED Notes (Signed)
Requested urine from patient. 

## 2017-06-17 MED ORDER — CEPHALEXIN 500 MG PO CAPS: 500.0000 mg | ORAL_CAPSULE | Freq: Two times a day (BID) | ORAL | 0 refills | Status: DC

## 2017-06-17 NOTE — Discharge Instructions (Signed)
Your lab work has been reassuring. Your imaging of your head and chest were normal. Your urine does show signs of infection. Have been given antibiotic. Please take as prescribed. This may be causing her symptoms. However make she follow up with the primary care Dr. Sherlynn Stalls visit in 2 days. They may want to order further testing. Continue to take herself off the zoloft. Return to the ed if symptoms return.

## 2017-06-19 LAB — URINE CULTURE

## 2017-06-20 ENCOUNTER — Telehealth: Payer: Self-pay

## 2017-06-20 NOTE — Telephone Encounter (Signed)
Post ED Visit - Positive Culture Follow-up  Culture report reviewed by antimicrobial stewardship pharmacist:  [x]  Elenor Quinones, Pharm.D. []  Heide Guile, Pharm.D., BCPS AQ-ID []  Parks Neptune, Pharm.D., BCPS []  Alycia Rossetti, Pharm.D., BCPS []  [place], Pharm.D., BCPS, AAHIVP []  Legrand Como, Pharm.D., BCPS, AAHIVP []  Salome Arnt, PharmD, BCPS []  Dimitri Ped, PharmD, BCPS []  Vincenza Hews, PharmD, BCPS  Positive urine culture Treated with Cephalexin, organism sensitive to the same and no further patient follow-up is required at this time.  Genia Del 06/20/2017, 11:12 AM

## 2017-06-24 ENCOUNTER — Other Ambulatory Visit: Payer: Self-pay | Admitting: Internal Medicine

## 2017-06-24 DIAGNOSIS — H539 Unspecified visual disturbance: Secondary | ICD-10-CM

## 2017-06-24 DIAGNOSIS — I671 Cerebral aneurysm, nonruptured: Secondary | ICD-10-CM

## 2017-06-24 DIAGNOSIS — R42 Dizziness and giddiness: Secondary | ICD-10-CM

## 2017-06-26 ENCOUNTER — Other Ambulatory Visit: Payer: Self-pay | Admitting: Internal Medicine

## 2017-06-26 DIAGNOSIS — H539 Unspecified visual disturbance: Secondary | ICD-10-CM

## 2017-06-26 DIAGNOSIS — R42 Dizziness and giddiness: Secondary | ICD-10-CM

## 2017-06-26 DIAGNOSIS — I671 Cerebral aneurysm, nonruptured: Secondary | ICD-10-CM

## 2017-07-01 ENCOUNTER — Ambulatory Visit
Admission: RE | Admit: 2017-07-01 | Discharge: 2017-07-01 | Disposition: A | Discharge status: Home / Self Care | Payer: Medicare Other | Source: Ambulatory Visit | Attending: Internal Medicine | Admitting: Internal Medicine

## 2017-07-01 DIAGNOSIS — R42 Dizziness and giddiness: Secondary | ICD-10-CM

## 2017-07-01 DIAGNOSIS — H539 Unspecified visual disturbance: Secondary | ICD-10-CM

## 2017-07-01 DIAGNOSIS — I671 Cerebral aneurysm, nonruptured: Secondary | ICD-10-CM | POA: Insufficient documentation

## 2017-07-08 ENCOUNTER — Encounter: Payer: Self-pay | Admitting: Physician Assistant

## 2017-07-09 ENCOUNTER — Ambulatory Visit
Admission: RE | Admit: 2017-07-09 | Discharge: 2017-07-09 | Disposition: A | Discharge status: Home / Self Care | Payer: Medicare Other | Source: Ambulatory Visit | Attending: Internal Medicine | Admitting: Internal Medicine

## 2017-07-09 DIAGNOSIS — R42 Dizziness and giddiness: Secondary | ICD-10-CM

## 2017-07-09 DIAGNOSIS — I671 Cerebral aneurysm, nonruptured: Secondary | ICD-10-CM

## 2017-07-09 DIAGNOSIS — H539 Unspecified visual disturbance: Secondary | ICD-10-CM

## 2017-07-18 ENCOUNTER — Encounter (INDEPENDENT_AMBULATORY_CARE_PROVIDER_SITE_OTHER): Payer: Self-pay

## 2017-07-18 ENCOUNTER — Ambulatory Visit (INDEPENDENT_AMBULATORY_CARE_PROVIDER_SITE_OTHER): Payer: Medicare Other | Admitting: Physician Assistant

## 2017-07-18 ENCOUNTER — Encounter: Payer: Self-pay | Admitting: Physician Assistant

## 2017-07-18 VITALS — BP 120/55 | Ht 64.0 in | Wt 176.0 lb

## 2017-07-18 DIAGNOSIS — K625 Hemorrhage of anus and rectum: Secondary | ICD-10-CM | POA: Diagnosis not present

## 2017-07-18 DIAGNOSIS — K644 Residual hemorrhoidal skin tags: Secondary | ICD-10-CM

## 2017-07-18 NOTE — Patient Instructions (Signed)
You will be scheduled for an appointment at [facility]. Please arrive 15 minutes early for registration. Make certain to bring a list of current medications, including any over the counter medications or vitamins. Also bring your co-pay if you have one as well as your insurance cards. [facility] is located at [address]. Should you need to reschedule your appointment, please contact them at [phone].

## 2017-07-18 NOTE — Progress Notes (Addendum)
Chief Complaint: Rectal bleeding  HPI:    Bethany Oconnor is a 76 year old Caucasian female with a past medical history as listed below including cervical cancer, who was referred to me by Crist Infante, MD for a complaint of rectal bleeding.      Per review of chart it appears she saw Dr. Wynetta Emery for what may have been a colonoscopy in 2005.  We do not have record of this today.    Today, the patient presents to clinic from [place] facility with her nurse.  Patient tells me that she has been having a rough year.  Her husband passed away in 2023/02/01 and since then she has been battling stress and depression.  With this she has been trying multiple antipsychotic medications which have not been helping her and "all have side effects".  Per her nurse the patient has also been battling a UTI which has been resilient.  The patient has been off and on multiple antibiotics and just now has stopped her last one with clear of her UTI symptoms.  Patient tells me she typically uses stool softeners for a normal bowel movement.      About 7-10 days ago, she started with some rectal bleeding, noting some bright red blood on the toilet paper.  Patient also tells me that she noticed that "my hemorrhoids were out down there".  Patient tells me she tried to "push these back in and they would stay for a little while but come back out".  She denies any pain associated with this. Patient did call her primary care provider and was started on what sounds like Hydrocortisone cream.  Patient has done this twice a day for about a week and this is helping.  Patient tells me that she has seen no further bleeding but "I want to know what to do about these".    Patient does describe vague history of hemorrhoid banding maybe 20 years ago. She does confirm a colonoscopy with Dr. Wynetta Emery in the past.    Patient denies fever, chills, change in bowel habits, weight loss, anorexia, nausea, vomiting, heartburn or reflux.  Past  Medical History:  Diagnosis Date  . Cerebral aneurysm    s/p endovascular colling 2008  . Cervical cancer ([place])   . Diabetes mellitus    borderline  . Hearing loss   . Hypertension    mild  . Macular degeneration   . Neuropathy   . Paroxysmal atrial fibrillation [facility])     Past Surgical History:  Procedure Laterality Date  . CATARACT EXTRACTION     bilateral  . CERVICAL CONE BIOPSY    . THYROIDECTOMY      Current Outpatient Medications  Medication Sig Dispense Refill  . acetaminophen (TYLENOL) 325 MG tablet Take 650 mg by mouth every 6 (six) hours as needed (pain).    . Calcium Carbonate-Vitamin D 600-400 MG-UNIT per tablet Take 1 tablet by mouth daily.    . dabigatran (PRADAXA) 150 MG CAPS Take 1 capsule (150 mg total) by mouth every 12 (twelve) hours. 180 capsule 3  . diltiazem (CARDIZEM CD) 180 MG 24 hr capsule Take 180 mg by mouth daily.    . diphenhydrAMINE (BENADRYL) 25 mg capsule Take 25 mg by mouth every 6 (six) hours as needed for itching.    . donepezil (ARICEPT) 5 MG tablet Take 5 mg by mouth at bedtime.    . hydrocortisone (ANUSOL-HC) 25 MG suppository Place 25 mg 2 (two) times daily rectally.    Marland Kitchen  lamoTRIgine (LAMICTAL) 25 MG tablet Take 25 mg by mouth 2 (two) times daily.    Marland Kitchen LORazepam (ATIVAN) 0.5 MG tablet Take 0.5 mg by mouth every 8 (eight) hours as needed for anxiety.    . Melatonin 5 MG TABS Take 5 mg by mouth at bedtime.    . memantine (NAMENDA) 5 MG tablet Take 5 mg by mouth daily.    . Multiple Vitamins-Minerals (MULTIVITAMINS THER. W/MINERALS) TABS tablet Take 1 tablet by mouth daily.    . Multiple Vitamins-Minerals (PRESERVISION AREDS) CAPS Take 1 capsule daily by mouth.    . pravastatin (PRAVACHOL) 40 MG tablet Take 40 mg by mouth daily.     Marland Kitchen PREMPRO 0.625-5 MG per tablet Take 1 tablet by mouth daily.   3  . Probiotic Product (ALIGN) 4 MG CAPS Take 1 capsule by mouth daily.    . sertraline (ZOLOFT) 25 MG tablet Take 25 mg by mouth daily. For 7 days.  Then take 12.5 mg for 7 days 06/13/17-06/19/17. Discontinue on 06/20/17.    Addison Lank Hazel (PREPARATION H EX) Apply 1 application topically 4 (four) times daily as needed (hemmoroids).     No current facility-administered medications for this visit.     Allergies as of 07/18/2017 - Review Complete 07/18/2017  Allergen Reaction Noted  . Ambien [zolpidem tartrate] Other (See Comments) 06/16/2017  . Fioricet-codeine [butalbital-apap-caff-cod] Other (See Comments) 06/16/2017  . Macrobid [nitrofurantoin] Other (See Comments) 06/16/2017  . Codeine Other (See Comments) 05/31/2010    Family History  Problem Relation Age of Onset  . Cancer Father 69       died  . Cerebral aneurysm Mother 59       died  . Heart attack Brother     Social History   Socioeconomic History  . Marital status: Married    Spouse name: Not on file  . Number of children: 2  . Years of education: Not on file  . Highest education level: Not on file  Social Needs  . Financial resource strain: Not on file  . Food insecurity - worry: Not on file  . Food insecurity - inability: Not on file  . Transportation needs - medical: Not on file  . Transportation needs - non-medical: Not on file  Occupational History    Employer: RETIRED  Tobacco Use  . Smoking status: Former Smoker    Last attempt to quit: 09/02/1988    Years since quitting: 28.8  . Smokeless tobacco: Never Used  Substance and Sexual Activity  . Alcohol use: No    Alcohol/week: 0.6 - 1.2 oz    Types: 1 - 2 Glasses of wine per week    Comment: daily for 50 years   . Drug use: No  . Sexual activity: Not on file  Other Topics Concern  . Not on file  Social History Narrative  . Not on file    Review of Systems:    Constitutional: No weight loss, fever or chills Skin: No rash  Cardiovascular: No chest pain Respiratory: No SOB Gastrointestinal: See HPI and otherwise negative Genitourinary: No dysuria  Neurological: No headache Musculoskeletal:  No new muscle or joint pain Hematologic: No bruising Psychiatric: No history of depression or anxiety   Physical Exam:  Vital signs: BP (!) 120/55   Ht 5\' 4"  (1.626 m)   Wt 176 lb (79.8 kg)   BMI 30.21 kg/m    Constitutional:   Pleasant Elderly Caucasian female appears to be in NAD, Well developed, Well  nourished, alert and cooperative Head:  Normocephalic and atraumatic. Eyes:   PEERL, EOMI. No icterus. Conjunctiva pink. Ears:  Normal auditory acuity. Neck:  Supple Throat: Oral cavity and pharynx without inflammation, swelling or lesion.  Respiratory: Respirations even and unlabored. Lungs clear to auscultation bilaterally.   No wheezes, crackles, or rhonchi.  Cardiovascular: Normal S1, S2. No MRG. Regular rate and rhythm. No peripheral edema, cyanosis or pallor.  Gastrointestinal:  Soft, nondistended, nontender. No rebound or guarding. Normal bowel sounds. No appreciable masses or hepatomegaly. Rectal:  External exam: Inflamed external hemorrhoids, cauliflower like tissue (?condyloma?) Msk:  Symmetrical without gross deformities. Without edema, no deformity or joint abnormality.  Uses a walker to ambulate Neurologic:  Alert and  oriented x4;  grossly normal neurologically.  Skin:   Dry and intact without significant lesions or rashes. Psychiatric:  Demonstrates good judgement and reason without abnormal affect or behaviors.  RELEVANT LABS AND IMAGING: CBC    Component Value Date/Time   WBC 23.1 (H) 06/16/2017 2149   RBC 4.16 06/16/2017 2149   HGB 13.5 06/16/2017 2149   HGB 13.2 04/25/2017 1113   HCT 39.5 06/16/2017 2149   HCT 40.0 04/25/2017 1113   PLT 178 06/16/2017 2149   PLT 196 04/25/2017 1113   MCV 95.0 06/16/2017 2149   MCV 97.3 04/25/2017 1113   MCH 32.5 06/16/2017 2149   MCHC 34.2 06/16/2017 2149   RDW 14.6 06/16/2017 2149   RDW 14.4 04/25/2017 1113   LYMPHSABS 12.0 (H) 04/25/2017 1113   MONOABS 0.8 04/25/2017 1113   EOSABS 0.1 04/25/2017 1113   BASOSABS 0.1  04/25/2017 1113    CMP     Component Value Date/Time   NA 140 06/16/2017 2149   NA 141 04/25/2017 1113   K 4.0 06/16/2017 2149   K 3.8 04/25/2017 1113   CL 107 06/16/2017 2149   CO2 23 06/16/2017 2149   CO2 25 04/25/2017 1113   GLUCOSE 97 06/16/2017 2149   GLUCOSE 136 04/25/2017 1113   BUN 11 06/16/2017 2149   BUN 13.9 04/25/2017 1113   CREATININE 0.89 06/16/2017 2149   CREATININE 0.9 04/25/2017 1113   CALCIUM 8.9 06/16/2017 2149   CALCIUM 9.4 04/25/2017 1113   PROT 6.7 04/25/2017 1113   ALBUMIN 3.3 (L) 04/25/2017 1113   AST 16 04/25/2017 1113   ALT 14 04/25/2017 1113   ALKPHOS 60 04/25/2017 1113   BILITOT 0.41 04/25/2017 1113   GFRNONAA >60 06/16/2017 2149   GFRAA >60 06/16/2017 2149    Assessment: 1.  Rectal bleeding: Likely from tissue below, +/- internal hemorrhoids 2.  External hemorrhoids: Visible at time of exam today, though tissue was cauliflower like, question possible condyloma  Plan: 1.  Case was reviewed with Dr. Carlean Purl at the time of patient's appointment.  He also did a rectal exam.  Tissue is concerning for possible condyloma.  Discussed this with the patient.  Would recommend that she see a colorectal surgeon in regards to possible removal of this tissue vs biopsy. 2.  Referred patient to [facility]. 3.  Patient to follow in clinic with [place] as needed in the future.  Bethany Newer, PA-C [facility] 07/18/2017, 12:38 PM  Cc: Crist Infante, MD   Agree with Ms. Trenese Haft's evaluation and management. Probably inflamed prolapsed hemorrhoid but not sure. Even if it is would benefit from removal.  Gatha Mayer, MD, [facility]

## 2017-07-22 ENCOUNTER — Telehealth: Payer: Self-pay | Admitting: Physician Assistant

## 2017-07-22 NOTE — Telephone Encounter (Signed)
Spoke to [facility], they do have the referral and have her registered and they should be caught up tomorrow so they can call and schedule her. Spoke to patients caregiver and she verbalized understanding.

## 2017-07-22 NOTE — Telephone Encounter (Signed)
Desiree can you check on this please?

## 2017-07-23 NOTE — Telephone Encounter (Signed)
Received fax from [place], appt scheduled for 08/04/17 with Dr. Dema Severin.

## 2017-07-28 ENCOUNTER — Other Ambulatory Visit (HOSPITAL_BASED_OUTPATIENT_CLINIC_OR_DEPARTMENT_OTHER): Payer: Medicare Other

## 2017-07-28 DIAGNOSIS — C9110 Chronic lymphocytic leukemia of B-cell type not having achieved remission: Secondary | ICD-10-CM | POA: Diagnosis not present

## 2017-07-28 LAB — CBC WITH DIFFERENTIAL/PLATELET
BASO%: 0.1 % (ref 0.0–2.0)
BASOS ABS: 0 10*3/uL (ref 0.0–0.1)
EOS ABS: 0.1 10*3/uL (ref 0.0–0.5)
EOS%: 0.4 % (ref 0.0–7.0)
HCT: 40.8 % (ref 34.8–46.6)
HGB: 13.6 g/dL (ref 11.6–15.9)
LYMPH%: 76.3 % — AB (ref 14.0–49.7)
MCH: 32.2 pg (ref 25.1–34.0)
MCHC: 33.3 g/dL (ref 31.5–36.0)
MCV: 96.7 fL (ref 79.5–101.0)
MONO#: 0.8 10*3/uL (ref 0.1–0.9)
MONO%: 3.8 % (ref 0.0–14.0)
NEUT#: 3.9 10*3/uL (ref 1.5–6.5)
NEUT%: 19.4 % — AB (ref 38.4–76.8)
PLATELETS: 171 10*3/uL (ref 145–400)
RBC: 4.22 10*6/uL (ref 3.70–5.45)
RDW: 14.3 % (ref 11.2–14.5)
WBC: 19.8 10*3/uL — AB (ref 3.9–10.3)
lymph#: 15.1 10*3/uL — ABNORMAL HIGH (ref 0.9–3.3)

## 2017-07-30 ENCOUNTER — Other Ambulatory Visit: Payer: Self-pay | Admitting: Cardiovascular Disease

## 2017-08-04 ENCOUNTER — Ambulatory Visit: Payer: Self-pay | Admitting: Surgery

## 2017-08-04 NOTE — H&P (Signed)
History of Present Illness Bethany Gave M. [name] Oconnor; 08/04/2017 3:13 PM) Patient words: CC: Anal tissue prolapse, bleeding, discomfort HPI: 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 Has been going on for a couple months. Whatever protrudes seems to always be out. She takes Pradaxa for her atrial fibrillation - Dr. Burt Knack is her cardiologist. She was seen by GI for this and referred here for further evaluation. She is not taking a daily fiber supplement. Does have occasional constipation and takes colace.  Has tried to take hydrocortisone cream to no avail. Recently lost her husband 12/2016 and has been battling depression since. Occasional issues with fecal incontinence - particularly gas and liquid stool. Does not wear diaper.  PMH: Atrial fibrillation (taking oral anticoagulation per patient - ?Pradaxa) hypertension, depression, hyperlipidemia (managed on pravastatin) PSH: Denies prior anorectal procedures FHx: Denies FHx of malignancy Social: Denies use of tobacco/EtOH/drugs ROS: A comprehensive 10 system review of systems was completed with the patient and pertinent findings as noted above.  The patient is a 76 year old female.   Allergies Alean Rinne, [place]; 08/04/2017 2:47 PM) Ambien *HYPNOTICS/SEDATIVES/SLEEP DISORDER AGENTS*   Macrobid *URINARY ANTI-INFECTIVES*   Codeine Phosphate *ANALGESICS - OPIOID*   Fioricet *ANALGESICS - NonNarcotic*   Allergies Reconciled    Medication History Alean Rinne, RMA; 08/04/2017 2:48 PM) LORazepam  (0.5MG  Tablet, Oral) Active. LORazepam  (1MG  Tablet, Oral) Active. DilTIAZem HCl ER Coated Beads  (180MG  Capsule ER 24HR, Oral) Active. Hydrocortisone Acetate  (25MG  Suppository, Rectal) Active. LamoTRIgine  (25MG  Tablet, Oral) Active. Latanoprost  (0.005% Solution, Ophthalmic) Active. Memantine HCl  (5MG  Tablet, Oral) Active. Monurol  (3GM Packet, Oral) Active. Pradaxa   (150MG  Capsule, Oral) Active. Pravastatin Sodium  (40MG  Tablet, Oral) Active. PredniSONE  (20MG  Tablet, Oral) Active. Prempro  (0.625-2.5MG  Tablet, Oral) Active. Proctozone-HC  (2.5% Cream, Rectal) Active. Sertraline HCl  (50MG  Tablet, Oral) Active. Shingrix  (50MCG For Suspension, Intramuscular) Active. Triamcinolone Acetonide  (0.1% Cream, External) Active. Medications Reconciled     Review of Systems Bethany Gave M. Bethany Oconnor; 08/04/2017 3:11 PM) General Not Present- Chills and Fever. Skin Not Present- Bruising and Jaundice. HEENT Not Present- Head Injury and Headache. Neck Not Present- Neck Mass and Neck Pain. Respiratory Not Present- Chronic Cough and Decreased Exercise Tolerance. Cardiovascular Not Present- Chest Pain and Difficulty Breathing Lying Down. Gastrointestinal Present- Hemorrhoids. Not Present- Abdominal Pain. Musculoskeletal Present- Joint Pain. Not Present- Joint Redness. Neurological Not Present- Difficulty Speaking and Dizziness. Psychiatric Present- Depression. Not Present- Anxiety. Endocrine Not Present- Excessive Sweating and Excessive Thirst. Hematology Not Present- Abnormal Bleeding and Blood Clots.  Vitals Alean Rinne RMA; 08/04/2017 2:45 PM) 08/04/2017 2:44 PM Weight: 175.5 lb   Height: 65 in  Body Surface Area: 1.87 m   Body Mass Index: 29.2 kg/m   Temp.: 98.7 F    Pulse: 96 (Regular)    BP: 140/80 (Sitting, Left Arm, Standard)       Physical Exam Bethany Gave M. Bethany Oconnor; 08/04/2017 3:19 PM) The physical exam findings are as follows: Note: Constitutional: No acute distress; conversant; no deformities Eyes: Moist conjunctiva; no lid lag; anicteric sclerae; pupils equal round and reactive to light Neck: Trachea midline; no palpable thyromegaly Lungs: Normal respiratory effort; no tactile fremitus CV: Regular rate and rhythm; no palpable thrill; no pitting edema GI: Abdomen soft, nontender, nondistended; no palpable  hepatosplenomegaly Anorectal: Posteriorly has tags and left lateral what appears to be a prolapsed internal hemorrhoid, grade III,  wart-like growths on the tag and prolapsed hemorrhoid. Mildly decreased sphincter tone. Anoscopy: Left posterior and right posterior skin lesion, ?condyloma and associated internal prolapsing hemorrhoid. No visible fissures or ulceration MSK: Normal gait; no clubbing/cyanosis Psychiatric: Appropriate affect; alert and oriented 3 Lymphatic: No palpable cervical or axillary lymphadenopathy **A chaperone, Bethany Oconnor, was present for the entire physical exam    Assessment & Plan Bethany Gave M. Bethany Oconnor; 08/04/2017 3:19 PM) HEMORRHOID (K64.9) Impression: [name] is a very pleasant 76yo lady here with what appears to be a grade III internal hemorrhoid and associated wart-like growth on the hemorrhoid and a skin tag -She will need to be seen back by her cardiologist given her stated history of taking an oral anticoagulant for atrial fibrillation -The anatomy physiology of the anal canal as well as pathophysiology of hemorrhoids and condyloma was discussed at length. -The planned procedure, material risks (including, but not limited to, pain, bleeding, infection, scarring, need for blood transfusion, damage to anal sphincter, incontinence of gas and/or stool, need for additional procedures, recurrence, pneumonia, heart attack, stroke, death) benefits and alternatives to surgery were discussed at length. I noted a good probability that the procedure would help improve their symptoms. The patient's questions were answered to their satisfaction and they elected to proceed with surgery. Current Plans ANOSCOPY, DIAGNOSTIC [phone]) (Anoscopy: Left posterior and right posterior skin lesion, ?condyloma and associated internal prolapsing hemorrhoid. No visible fissures or ulceration) Signed electronically by Ileana Roup, Oconnor (08/04/2017 3:20 PM)

## 2017-08-06 ENCOUNTER — Telehealth: Payer: Self-pay | Admitting: Cardiovascular Disease

## 2017-08-06 NOTE — Telephone Encounter (Signed)
° °  [facility] Pre-operative Risk Assessment    Request for surgical clearance:  1. What type of surgery is being performed? Rectal procedure  2. When is this surgery scheduled? TBD  3. Are there any medications that need to be held prior to surgery and how long? If there is any medications that need to be held, please inform us.    4. Practice name and name of physician performing surgery? [facility], [place], Dr. Nadeen Landau  5. What is your office phone and fax number? PH#: [phone], FAX# [phone], ATTN: Alean Rinne, RMA  6. Anesthesia type (None, local, MAC, general) ? General    Bethany Oconnor 08/06/2017, 9:44 AM  _________________________________________________________________   (provider comments below)

## 2017-08-07 NOTE — Telephone Encounter (Signed)
Pt takes Pradaxa for afib with CHADS2VASc score of 4 (age - 2, sex, HTN). CrCl is 62mL/min. Recommend holding Pradaxa for 2 days prior to procedure.

## 2017-08-09 NOTE — Telephone Encounter (Signed)
   Chart reviewed as part of pre-operative protocol coverage. Patient was contacted 08/09/2017 in reference to pre-operative risk assessment for pending surgery as outlined below.  Bethany Oconnor was last seen on 06/09/17 by Dr. Burt Knack.  Since that day, Bethany Oconnor has done well from cardiac perspective. RCRI calculated at 0.04% risk of major cardiac event. She lives at [place] and is not particularly active but is able to achieve 4.6 METS with everyday life without any angina or dyspnea. Therefore, based on ACC/AHA guidelines, the patient would be at acceptable risk for the planned procedure without further cardiovascular testing.   Dr. Antionette Char note alludes to ASD. Will forward request to him to inquire about SBE prophylaxis. Dr. Burt Knack please forward your reply to P CV DIV PREOP only. Once this info is know, bundled rec with pharmacy info will need to be routed to requesting party.  Charlie Pitter, PA-C 08/09/2017, 2:30 PM

## 2017-08-13 NOTE — Telephone Encounter (Signed)
Thanks Dayna. No SBE prophylaxis indicated. May proceed as planned.

## 2017-08-14 NOTE — Telephone Encounter (Signed)
   Primary Cardiologist: No primary care provider on file.  Chart reviewed as part of pre-operative protocol coverage. Given past medical history, time since last visit, and with telephone note documenting the patient has done well from a cardiac perspective with the ability to achieve 4.6 METs, based on ACC/AHA guidelines, Bethany Oconnor would be at acceptable risk for the planned procedure without further cardiovascular testing. Per our clinical pharmacist, the patient will need to hold Pradaxa for 2 days prior to the procedure. Resumption of Pradaxa is deferred to the treating physician. Please resume Pradaxa when safely able. Per Dr. Burt Knack, no SBE prophylaxis is indicated.   I will route this recommendation to the requesting party via Epic fax function and remove from pre-op pool.  Please call with questions.  Christell Faith, PA-C 08/14/2017, 3:13 PM

## 2017-08-29 NOTE — Patient Instructions (Signed)
Bethany Oconnor  08/29/2017   Your procedure is scheduled on: 09-03-17  Report to [facility] Main  Entrance Take [place]  elevators to 3rd floor to  [place] at      [phone].    Call this number if you have problems the morning of surgery [phone]   Remember: ONLY 1 PERSON MAY GO WITH YOU TO SHORT STAY TO GET  READY MORNING OF YOUR SURGERY.  Do not eat food or drink liquids :After Midnight.     FOLLOW A CLEAR LIQUID DIET THE DAY OF YOUR BOWEL PREP TO PREVENT DEHYDRATION             FOLLOW BOWEL PREP PER MD INSTRUCTIONS   Take these medicines the morning of surgery with A SIP OF WATER: Prevastatin, Namenda, Diltiazem, Wellbutrin                                You may not have any metal on your body including hair pins and              piercings  Do not wear jewelry, make-up, lotions, powders or perfumes, deodorant             Do not wear nail polish.  Do not shave  48 hours prior to surgery.     Do not bring valuables to the hospital. [place].  Contacts, dentures or bridgework may not be worn into surgery.       Patients discharged the day of surgery will not be allowed to drive home.  Name and phone number of your driver:  Special Instructions: N/A              Please read over the following fact sheets you were given: _____________________________________________________________________          [facility] - Preparing for Surgery Before surgery, you can play an important role.  Because skin is not sterile, your skin needs to be as free of germs as possible.  You can reduce the number of germs on your skin by washing with CHG (chlorahexidine gluconate) soap before surgery.  CHG is an antiseptic cleaner which kills germs and bonds with the skin to continue killing germs even after washing. Please DO NOT use if you have an allergy to CHG or antibacterial soaps.  If your skin becomes  reddened/irritated stop using the CHG and inform your nurse when you arrive at Short Stay. Do not shave (including legs and underarms) for at least 48 hours prior to the first CHG shower.  You may shave your face/neck. Please follow these instructions carefully:  1.  Shower with CHG Soap the night before surgery and the  morning of Surgery.  2.  If you choose to wash your hair, wash your hair first as usual with your  normal  shampoo.  3.  After you shampoo, rinse your hair and body thoroughly to remove the  shampoo.                           4.  Use CHG as you would any other liquid soap.  You can apply chg directly  to the skin and wash  Gently with a scrungie or clean washcloth.  5.  Apply the CHG Soap to your body ONLY FROM THE NECK DOWN.   Do not use on face/ open                           Wound or open sores. Avoid contact with eyes, ears mouth and genitals (private parts).                       Wash face,  Genitals (private parts) with your normal soap.             6.  Wash thoroughly, paying special attention to the area where your surgery  will be performed.  7.  Thoroughly rinse your body with warm water from the neck down.  8.  DO NOT shower/wash with your normal soap after using and rinsing off  the CHG Soap.                9.  Pat yourself dry with a clean towel.            10.  Wear clean pajamas.            11.  Place clean sheets on your bed the night of your first shower and do not  sleep with pets. Day of Surgery : Do not apply any lotions/deodorants the morning of surgery.  Please wear clean clothes to the hospital/surgery center.  FAILURE TO FOLLOW THESE INSTRUCTIONS MAY RESULT IN THE CANCELLATION OF YOUR SURGERY PATIENT SIGNATURE_________________________________  NURSE SIGNATURE__________________________________  ________________________________________________________________________    CLEAR LIQUID DIET   Foods Allowed                                                                      Foods Excluded  Coffee and tea, regular and decaf                             liquids that you cannot  Plain Jell-O in any flavor                                             see through such as: Fruit ices (not with fruit pulp)                                     milk, soups, orange juice  Iced Popsicles                                    All solid food Carbonated beverages, regular and diet                                    Cranberry, grape and apple juices Sports drinks like Gatorade Lightly seasoned clear broth or consume(fat free) Sugar, honey syrup  Sample Menu Breakfast                                Lunch                                     Supper Cranberry juice                    Beef broth                            Chicken broth Jell-O                                     Grape juice                           Apple juice Coffee or tea                        Jell-O                                      Popsicle                                                Coffee or tea                        Coffee or tea  _____________________________________________________________________    Incentive Spirometer  An incentive spirometer is a tool that can help keep your lungs clear and active. This tool measures how well you are filling your lungs with each breath. Taking long deep breaths may help reverse or decrease the chance of developing breathing (pulmonary) problems (especially infection) following:  A long period of time when you are unable to move or be active. BEFORE THE PROCEDURE   If the spirometer includes an indicator to show your best effort, your nurse or respiratory therapist will set it to a desired goal.  If possible, sit up straight or lean slightly forward. Try not to slouch.  Hold the incentive spirometer in an upright position. INSTRUCTIONS FOR USE  1. Sit on the edge of your bed if possible, or sit up as far as you  can in bed or on a chair. 2. Hold the incentive spirometer in an upright position. 3. Breathe out normally. 4. Place the mouthpiece in your mouth and seal your lips tightly around it. 5. Breathe in slowly and as deeply as possible, raising the piston or the ball toward the top of the column. 6. Hold your breath for 3-5 seconds or for as long as possible. Allow the piston or ball to fall to the bottom of the column. 7. Remove the mouthpiece from your mouth and breathe out normally. 8. Rest for a few seconds and repeat Steps 1 through 7 at least 10 times every 1-2 hours when you are awake. Take your time and take a few normal breaths between  deep breaths. 9. The spirometer may include an indicator to show your best effort. Use the indicator as a goal to work toward during each repetition. 10. After each set of 10 deep breaths, practice coughing to be sure your lungs are clear. If you have an incision (the cut made at the time of surgery), support your incision when coughing by placing a pillow or rolled up towels firmly against it. Once you are able to get out of bed, walk around indoors and cough well. You may stop using the incentive spirometer when instructed by your caregiver.  RISKS AND COMPLICATIONS  Take your time so you do not get dizzy or light-headed.  If you are in pain, you may need to take or ask for pain medication before doing incentive spirometry. It is harder to take a deep breath if you are having pain. AFTER USE  Rest and breathe slowly and easily.  It can be helpful to keep track of a log of your progress. Your caregiver can provide you with a simple table to help with this. If you are using the spirometer at home, follow these instructions: [place] IF:   You are having difficultly using the spirometer.  You have trouble using the spirometer as often as instructed.  Your pain medication is not giving enough relief while using the spirometer.  You develop  fever of 100.5 F (38.1 C) or higher. SEEK IMMEDIATE MEDICAL CARE IF:   You cough up bloody sputum that had not been present before.  You develop fever of 102 F (38.9 C) or greater.  You develop worsening pain at or near the incision site. MAKE SURE YOU:   Understand these instructions.  Will watch your condition.  Will get help right away if you are not doing well or get worse. Document Released: 12/30/2006 Document Revised: 11/11/2011 Document Reviewed: 03/02/2007 [facility] Patient Information 2014 [place], [place].   ________________________________________________________________________

## 2017-09-01 ENCOUNTER — Ambulatory Visit: Payer: Self-pay | Admitting: Gastroenterology

## 2017-09-01 ENCOUNTER — Ambulatory Visit (HOSPITAL_COMMUNITY)
Admission: RE | Admit: 2017-09-01 | Discharge: 2017-09-01 | Disposition: A | Discharge status: Home / Self Care | Payer: Medicare Other | Source: Ambulatory Visit | Attending: Anesthesiology | Admitting: Anesthesiology

## 2017-09-01 ENCOUNTER — Encounter (HOSPITAL_COMMUNITY): Payer: Self-pay

## 2017-09-01 ENCOUNTER — Encounter (HOSPITAL_COMMUNITY)
Admission: RE | Admit: 2017-09-01 | Discharge: 2017-09-01 | Disposition: A | Discharge status: Home / Self Care | Payer: Medicare Other | Source: Ambulatory Visit | Attending: Surgery | Admitting: Surgery

## 2017-09-01 ENCOUNTER — Other Ambulatory Visit: Payer: Self-pay

## 2017-09-01 DIAGNOSIS — Z01818 Encounter for other preprocedural examination: Secondary | ICD-10-CM

## 2017-09-01 DIAGNOSIS — K629 Disease of anus and rectum, unspecified: Secondary | ICD-10-CM | POA: Diagnosis not present

## 2017-09-01 DIAGNOSIS — Z01812 Encounter for preprocedural laboratory examination: Secondary | ICD-10-CM | POA: Insufficient documentation

## 2017-09-01 HISTORY — DX: Anxiety disorder, unspecified: F41.9

## 2017-09-01 HISTORY — DX: Depression, unspecified: F32.A

## 2017-09-01 HISTORY — DX: Pneumonia, unspecified organism: J18.9

## 2017-09-01 HISTORY — DX: Major depressive disorder, single episode, unspecified: F32.9

## 2017-09-01 LAB — CBC WITH DIFFERENTIAL/PLATELET
Basophils Absolute: 0 10*3/uL (ref 0.0–0.1)
Basophils Relative: 0 %
EOS PCT: 1 %
Eosinophils Absolute: 0.2 10*3/uL (ref 0.0–0.7)
HCT: 41.1 % (ref 36.0–46.0)
HEMOGLOBIN: 13.7 g/dL (ref 12.0–15.0)
LYMPHS PCT: 76 %
Lymphs Abs: 15.6 10*3/uL — ABNORMAL HIGH (ref 0.7–4.0)
MCH: 32.3 pg (ref 26.0–34.0)
MCHC: 33.3 g/dL (ref 30.0–36.0)
MCV: 96.9 fL (ref 78.0–100.0)
MONO ABS: 0.8 10*3/uL (ref 0.1–1.0)
MONOS PCT: 4 %
NEUTROS PCT: 19 %
Neutro Abs: 3.9 10*3/uL (ref 1.7–7.7)
PLATELETS: 182 10*3/uL (ref 150–400)
RBC: 4.24 MIL/uL (ref 3.87–5.11)
RDW: 14.4 % (ref 11.5–15.5)
WBC: 20.5 10*3/uL — AB (ref 4.0–10.5)

## 2017-09-01 LAB — COMPREHENSIVE METABOLIC PANEL
ALT: 16 U/L (ref 14–54)
AST: 28 U/L (ref 15–41)
Albumin: 3.6 g/dL (ref 3.5–5.0)
Alkaline Phosphatase: 58 U/L (ref 38–126)
Anion gap: 7 (ref 5–15)
BUN: 12 mg/dL (ref 6–20)
CHLORIDE: 110 mmol/L (ref 101–111)
CO2: 23 mmol/L (ref 22–32)
CREATININE: 0.88 mg/dL (ref 0.44–1.00)
Calcium: 9 mg/dL (ref 8.9–10.3)
GFR calc Af Amer: >60 mL/min (ref >60)
Glucose, Bld: 138 mg/dL — ABNORMAL HIGH (ref 65–99)
Potassium: 4.2 mmol/L (ref 3.5–5.1)
Sodium: 140 mmol/L (ref 135–145)
Total Bilirubin: 0.6 mg/dL (ref 0.3–1.2)
Total Protein: 6.5 g/dL (ref 6.5–8.1)

## 2017-09-01 LAB — HEMOGLOBIN A1C
HEMOGLOBIN A1C: 5.7 % — AB (ref 4.8–5.6)
Mean Plasma Glucose: 116.89 mg/dL

## 2017-09-01 LAB — PATHOLOGIST SMEAR REVIEW

## 2017-09-01 NOTE — Progress Notes (Signed)
Instructions for day of surgery medications Faxed to home Care services office [phone]. Per CNA Zeb Comfort with pt. At preop this is for the medication services provided for pt.

## 2017-09-01 NOTE — Progress Notes (Signed)
cxr 09-16-16 epic  ekg 06-16-17 epic   echo 04-17-17 epic  Clearance Christell Faith PA-C  cards 08-14-17 epic note

## 2017-09-01 NOTE — Progress Notes (Signed)
Cbc done 09-01-17 routed to Dr. Dema Severin via epic

## 2017-09-03 ENCOUNTER — Ambulatory Visit (HOSPITAL_COMMUNITY): Payer: Medicare Other | Admitting: Anesthesiology

## 2017-09-03 ENCOUNTER — Ambulatory Visit (HOSPITAL_COMMUNITY)
Admission: RE | Admit: 2017-09-03 | Discharge: 2017-09-03 | Disposition: A | Discharge status: Home / Self Care | Payer: Medicare Other | Source: Ambulatory Visit | Attending: Surgery | Admitting: Surgery

## 2017-09-03 ENCOUNTER — Encounter (HOSPITAL_COMMUNITY)
Admission: RE | Disposition: A | Discharge status: Home / Self Care | Payer: Self-pay | Source: Ambulatory Visit | Attending: Surgery

## 2017-09-03 ENCOUNTER — Encounter (HOSPITAL_COMMUNITY): Payer: Self-pay | Admitting: Anesthesiology

## 2017-09-03 DIAGNOSIS — F329 Major depressive disorder, single episode, unspecified: Secondary | ICD-10-CM | POA: Insufficient documentation

## 2017-09-03 DIAGNOSIS — Z881 Allergy status to other antibiotic agents status: Secondary | ICD-10-CM | POA: Diagnosis not present

## 2017-09-03 DIAGNOSIS — Z888 Allergy status to other drugs, medicaments and biological substances status: Secondary | ICD-10-CM | POA: Diagnosis not present

## 2017-09-03 DIAGNOSIS — Z885 Allergy status to narcotic agent status: Secondary | ICD-10-CM | POA: Diagnosis not present

## 2017-09-03 DIAGNOSIS — E785 Hyperlipidemia, unspecified: Secondary | ICD-10-CM | POA: Diagnosis not present

## 2017-09-03 DIAGNOSIS — K5900 Constipation, unspecified: Secondary | ICD-10-CM | POA: Insufficient documentation

## 2017-09-03 DIAGNOSIS — A630 Anogenital (venereal) warts: Secondary | ICD-10-CM | POA: Diagnosis not present

## 2017-09-03 DIAGNOSIS — I10 Essential (primary) hypertension: Secondary | ICD-10-CM | POA: Insufficient documentation

## 2017-09-03 DIAGNOSIS — K648 Other hemorrhoids: Secondary | ICD-10-CM | POA: Diagnosis not present

## 2017-09-03 DIAGNOSIS — Z79899 Other long term (current) drug therapy: Secondary | ICD-10-CM | POA: Insufficient documentation

## 2017-09-03 DIAGNOSIS — I4891 Unspecified atrial fibrillation: Secondary | ICD-10-CM | POA: Insufficient documentation

## 2017-09-03 DIAGNOSIS — Z8679 Personal history of other diseases of the circulatory system: Secondary | ICD-10-CM | POA: Insufficient documentation

## 2017-09-03 DIAGNOSIS — K623 Rectal prolapse: Secondary | ICD-10-CM | POA: Insufficient documentation

## 2017-09-03 DIAGNOSIS — D013 Carcinoma in situ of anus and anal canal: Secondary | ICD-10-CM | POA: Diagnosis not present

## 2017-09-03 DIAGNOSIS — K644 Residual hemorrhoidal skin tags: Secondary | ICD-10-CM | POA: Insufficient documentation

## 2017-09-03 DIAGNOSIS — K649 Unspecified hemorrhoids: Secondary | ICD-10-CM | POA: Diagnosis present

## 2017-09-03 HISTORY — PX: EVALUATION UNDER ANESTHESIA WITH HEMORRHOIDECTOMY: SHX5624

## 2017-09-03 LAB — APTT: aPTT: 24 seconds (ref 24–36)

## 2017-09-03 SURGERY — EXAM UNDER ANESTHESIA WITH HEMORRHOIDECTOMY
Anesthesia: General | Site: Rectum

## 2017-09-03 MED ORDER — TRAMADOL HCL 50 MG PO TABS: 50.0000 mg | ORAL_TABLET | Freq: Four times a day (QID) | ORAL | 0 refills | Status: AC | PRN

## 2017-09-03 MED ORDER — PROPOFOL 10 MG/ML IV BOLUS
INTRAVENOUS | Status: DC | PRN
  Administered 2017-09-03: 100 mg via INTRAVENOUS

## 2017-09-03 MED ORDER — ONDANSETRON HCL 4 MG/2ML IJ SOLN
INTRAMUSCULAR | Status: DC | PRN
  Administered 2017-09-03: 4 mg via INTRAVENOUS

## 2017-09-03 MED ORDER — 0.9 % SODIUM CHLORIDE (POUR BTL) OPTIME
TOPICAL | Status: DC | PRN
  Administered 2017-09-03: 1000 mL

## 2017-09-03 MED ORDER — ROCURONIUM BROMIDE 10 MG/ML (PF) SYRINGE
PREFILLED_SYRINGE | INTRAVENOUS | Status: DC | PRN
  Administered 2017-09-03: 40 mg via INTRAVENOUS

## 2017-09-03 MED ORDER — SUGAMMADEX SODIUM 200 MG/2ML IV SOLN
INTRAVENOUS | Status: AC
Start: 2017-09-03 — End: ?
  Filled 2017-09-03: qty 2

## 2017-09-03 MED ORDER — CHLORHEXIDINE GLUCONATE CLOTH 2 % EX PADS: 6.0000 | MEDICATED_PAD | Freq: Once | CUTANEOUS | Status: DC

## 2017-09-03 MED ORDER — CEFOTETAN DISODIUM-DEXTROSE 2-2.08 GM-%(50ML) IV SOLR
2.0000 g | INTRAVENOUS | Status: AC
  Administered 2017-09-03: 2 g via INTRAVENOUS
  Filled 2017-09-03: qty 50

## 2017-09-03 MED ORDER — BUPIVACAINE LIPOSOME 1.3 % IJ SUSP
20.0000 mL | Freq: Once | INTRAMUSCULAR | Status: AC
  Administered 2017-09-03: 20 mL
  Filled 2017-09-03: qty 20

## 2017-09-03 MED ORDER — LIDOCAINE 2% (20 MG/ML) 5 ML SYRINGE
INTRAMUSCULAR | Status: DC | PRN
  Administered 2017-09-03: 50 mg via INTRAVENOUS

## 2017-09-03 MED ORDER — DEXAMETHASONE SODIUM PHOSPHATE 10 MG/ML IJ SOLN
INTRAMUSCULAR | Status: AC
  Filled 2017-09-03: qty 1

## 2017-09-03 MED ORDER — LACTATED RINGERS IV SOLN
INTRAVENOUS | Status: DC
  Administered 2017-09-03: 08:00:00 via INTRAVENOUS

## 2017-09-03 MED ORDER — FENTANYL CITRATE (PF) 100 MCG/2ML IJ SOLN
INTRAMUSCULAR | Status: DC | PRN
  Administered 2017-09-03 (×2): 25 ug via INTRAVENOUS
  Administered 2017-09-03: 50 ug via INTRAVENOUS

## 2017-09-03 MED ORDER — ROCURONIUM BROMIDE 50 MG/5ML IV SOSY
PREFILLED_SYRINGE | INTRAVENOUS | Status: AC
  Filled 2017-09-03: qty 5

## 2017-09-03 MED ORDER — FENTANYL CITRATE (PF) 100 MCG/2ML IJ SOLN: 25.0000 ug | INTRAMUSCULAR | Status: DC | PRN

## 2017-09-03 MED ORDER — DABIGATRAN ETEXILATE MESYLATE 150 MG PO CAPS: 150.0000 mg | ORAL_CAPSULE | Freq: Two times a day (BID) | ORAL | 3 refills | Status: DC

## 2017-09-03 MED ORDER — BUPIVACAINE-EPINEPHRINE 0.25% -1:200000 IJ SOLN
INTRAMUSCULAR | Status: DC | PRN
  Administered 2017-09-03: 30 mL

## 2017-09-03 MED ORDER — BUPIVACAINE-EPINEPHRINE 0.25% -1:200000 IJ SOLN
INTRAMUSCULAR | Status: AC
  Filled 2017-09-03: qty 1

## 2017-09-03 MED ORDER — SUGAMMADEX SODIUM 200 MG/2ML IV SOLN
INTRAVENOUS | Status: DC | PRN
  Administered 2017-09-03: 160 mg via INTRAVENOUS

## 2017-09-03 MED ORDER — PROPOFOL 10 MG/ML IV BOLUS
INTRAVENOUS | Status: AC
  Filled 2017-09-03: qty 20

## 2017-09-03 MED ORDER — LIDOCAINE 2% (20 MG/ML) 5 ML SYRINGE
INTRAMUSCULAR | Status: AC
  Filled 2017-09-03: qty 5

## 2017-09-03 MED ORDER — ONDANSETRON HCL 4 MG/2ML IJ SOLN
INTRAMUSCULAR | Status: AC
  Filled 2017-09-03: qty 2

## 2017-09-03 MED ORDER — FENTANYL CITRATE (PF) 100 MCG/2ML IJ SOLN
INTRAMUSCULAR | Status: AC
  Filled 2017-09-03: qty 2

## 2017-09-03 MED ORDER — DEXAMETHASONE SODIUM PHOSPHATE 10 MG/ML IJ SOLN
INTRAMUSCULAR | Status: DC | PRN
  Administered 2017-09-03: 10 mg via INTRAVENOUS

## 2017-09-03 MED ORDER — BACITRACIN-NEOMYCIN-POLYMYXIN 400-5-5000 EX OINT
TOPICAL_OINTMENT | CUTANEOUS | Status: AC
  Filled 2017-09-03: qty 1

## 2017-09-03 SURGICAL SUPPLY — 30 items
BLADE SURG 15 STRL LF DISP TIS (BLADE) ×1 IMPLANT
BLADE SURG 15 STRL SS (BLADE) ×2
BRIEF STRETCH FOR OB PAD LRG (UNDERPADS AND DIAPERS) ×3 IMPLANT
DECANTER SPIKE VIAL GLASS SM (MISCELLANEOUS) ×3 IMPLANT
DRAPE LAPAROTOMY T 102X78X121 (DRAPES) ×3 IMPLANT
ELECT PENCIL ROCKER SW 15FT (MISCELLANEOUS) ×3 IMPLANT
ELECT REM PT RETURN 15FT ADLT (MISCELLANEOUS) ×3 IMPLANT
GAUZE SPONGE 4X4 12PLY STRL (GAUZE/BANDAGES/DRESSINGS) ×3 IMPLANT
GAUZE SPONGE 4X4 16PLY XRAY LF (GAUZE/BANDAGES/DRESSINGS) ×3 IMPLANT
GLOVE BIO SURGEON STRL SZ 6.5 (GLOVE) ×2 IMPLANT
GLOVE BIO SURGEONS STRL SZ 6.5 (GLOVE) ×1
GLOVE BIOGEL PI IND STRL 7.0 (GLOVE) ×1 IMPLANT
GLOVE BIOGEL PI INDICATOR 7.0 (GLOVE) ×2
GOWN STRL REUS W/TWL 2XL LVL3 (GOWN DISPOSABLE) ×3 IMPLANT
GOWN STRL REUS W/TWL XL LVL3 (GOWN DISPOSABLE) ×6 IMPLANT
KIT BASIN OR (CUSTOM PROCEDURE TRAY) ×3 IMPLANT
LUBRICANT JELLY K Y 4OZ (MISCELLANEOUS) ×3 IMPLANT
NEEDLE HYPO 22GX1.5 SAFETY (NEEDLE) ×3 IMPLANT
PACK BASIC VI WITH GOWN DISP (CUSTOM PROCEDURE TRAY) ×3 IMPLANT
SHEARS HARMONIC 9CM CVD (BLADE) IMPLANT
SPONGE SURGIFOAM ABS GEL 100 (HEMOSTASIS) ×3 IMPLANT
SUT CHROMIC 2 0 SH (SUTURE) IMPLANT
SUT CHROMIC 3 0 SH 27 (SUTURE) IMPLANT
SUT MNCRL AB 4-0 PS2 18 (SUTURE) ×3 IMPLANT
SUT VIC AB 3-0 SH 27 (SUTURE) ×4
SUT VIC AB 3-0 SH 27XBRD (SUTURE) ×2 IMPLANT
SYR 20CC LL (SYRINGE) ×3 IMPLANT
TOWEL OR 17X26 10 PK STRL BLUE (TOWEL DISPOSABLE) ×3 IMPLANT
TOWEL OR NON WOVEN STRL DISP B (DISPOSABLE) ×3 IMPLANT
YANKAUER SUCT BULB TIP 10FT TU (MISCELLANEOUS) ×3 IMPLANT

## 2017-09-03 NOTE — Anesthesia Procedure Notes (Signed)
Procedure Name: Intubation Date/Time: 09/03/2017 10:12 AM Performed by: Anne Fu, CRNA Pre-anesthesia Checklist: Patient identified, Emergency Drugs available, Suction available, Patient being monitored and Timeout performed Patient Re-evaluated:Patient Re-evaluated prior to induction Oxygen Delivery Method: Circle system utilized Preoxygenation: Pre-oxygenation with 100% oxygen Induction Type: IV induction Ventilation: Mask ventilation without difficulty Laryngoscope Size: Mac and 4 Grade View: Grade I Tube type: Oral Tube size: 7.5 mm Number of attempts: 1 Airway Equipment and Method: Stylet Placement Confirmation: ETT inserted through vocal cords under direct vision,  positive ETCO2 and breath sounds checked- equal and bilateral Secured at: 19 cm Tube secured with: Tape Dental Injury: Teeth and Oropharynx as per pre-operative assessment

## 2017-09-03 NOTE — Op Note (Signed)
09/03/2017  10:57 AM  PATIENT:  Bethany Oconnor  77 y.o. female  Patient Care Team: Crist Infante, MD as PCP - General (Internal Medicine) Crist Infante, MD (Internal Medicine) Newton Pigg, MD as Consulting Physician (Obstetrics and Gynecology) Sherren Mocha, MD as Consulting Physician (Cardiology) Truitt Merle, MD as Consulting Physician (Hematology)  PRE-OPERATIVE DIAGNOSIS:  ANAL LESION, HEMORRHOIDS  POST-OPERATIVE DIAGNOSIS:  ANAL LESION, HEMORRHOIDS  PROCEDURE:   1. Exam under anesthesia with hemorrhoidectomy - right posterior  SURGEON:  Sharon Mt. Sabri Teal, MD  ASSISTANT: None  ANESTHESIA:   general  COUNTS:  Sponge, needle and instrument counts were reported correct x2 at the conclusion of the operation.  EBL: 10cc  DRAINS: None  SPECIMEN: Right posterior hemorrhoidal tissue  FINDINGS: Right posterior hemorrhoid column with exophytic tissue below the dentate - entire internal+external component removed. 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 Has been going on for a couple months. Tissue that protrudes seems to always be out. She takes Pradaxa for her atrial fibrillation - Dr. Burt Knack is her cardiologist. She was seen by GI for this and referred here for further evaluation. She is not taking a daily fiber supplement. Does have occasional constipation and takes colace. Has tried to take hydrocortisone cream to no avail. Recently lost her husband 12/2016 and has been battling depression since. Occasional issues with fecal incontinence - particularly gas and liquid stool. Does not wear diaper. She has been off her pradaxa for ~3 days now. On exam, there was left posterior skin and associated hemorrhoid;  also a small rectal prolapse. 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  The anatomy and physiology of the anal was described in detail using pictures/diagrams. The pathophysiology of hemorrhoids and condyloma was detailed. The planned procedure, material risks (including, but not limited to, pain, bleeding, need for blood transfusion infection, worsening of fecal continence, damage to surrounding structures, need for additional procedures, heart attack, stroke, death), benefits and alternatives were explained. The patient's questions were answered to their satisfaction and they elected to proceed with surgery.  DESCRIPTION: The patient was identified in preop holding and taken to the OR where SCDs were placed. General endotracheal anesthesia was induced without difficulty. The patient was then rolled into the prone position on the OR table, prepped and draped in the usual sterile fashion. A surgical timeout was performed indicating the correct patient, procedure, positioning and need for preoperative antibiotics.   A digital rectal exam confirmed no rectal masses. Externally, there was the skin tag + hemorrhoid tissue. A Hill Ferguson retractor was placed and circumferential inspection of the anal canal only notable for the right posterior hemorrhoidal tissue and a small rectal mucosal prolapse. A perianal/pudendal block was performed with Exparel+Marcaine with epinepherine. The hemorrhoidal tissue was identified and grasped with a Debakey. This was then excised with electrocautery, including the external skin tag +condylomatous like tissue - this was then passed off as a specimen. The hemorrhoidal pedicle was then ligated with a 3-0 vicryl suture. Hemostasis was verified. The mucosa down to the anal verge was closed with a running locking 3-0 vicryl suture. The anoderm was then approximated with a running 4-0 vicryl suture. The anal canal was irrigated with sterile saline and hemostasis verified. A  piece of proctofoam  was then coated with bacitracin and placed in the anal canal. The wound was dressed with 4x4 gauze. The patient was then awakened from general anesthesia after being rolled back onto a stretcher. She was extubated and transported to PACU in satisfactory condition.  Sharon Mt. Dema Severin, M.D. [facility]  Note: This dictation was prepared with Dragon/digital dictation along with [employer]. Any transcriptional errors that result from this process are unintentional.

## 2017-09-03 NOTE — Anesthesia Postprocedure Evaluation (Signed)
Anesthesia Post Note  Patient: Bethany Oconnor  Procedure(s) Performed: EXAM UNDER ANESTHESIA, EXCISION OF ANAL LESION, POSSIBLE HEMORRHOIDECTOMY (N/A Rectum)     Patient location during evaluation: PACU Anesthesia Type: General Level of consciousness: awake and alert, patient cooperative and oriented Pain management: pain level controlled Vital Signs Assessment: post-procedure vital signs reviewed and stable Respiratory status: spontaneous breathing, nonlabored ventilation and respiratory function stable Cardiovascular status: blood pressure returned to baseline and stable Postop Assessment: no apparent nausea or vomiting Anesthetic complications: no    Last Vitals:  Vitals:   09/03/17 1150 09/03/17 1159  BP: (!) 156/83 140/69  Pulse: 78   Resp: (!) 21 20  Temp: 36.4 C 36.4 C  SpO2: 91% 91%    Last Pain:  Vitals:   09/03/17 1150  TempSrc:   PainSc: 0-No pain                 Dhanya Bogle,E. Kasumi Ditullio

## 2017-09-03 NOTE — H&P (Signed)
H&P Update  H&P reviewed from 08/04/17. No changes  PLAN  -OR for excision of hemorrhoid+condylomatous like tag  -Position: Prone -The planned procedure, material risks (including but not limited to pain, bleeding, need for blood transfusion, infection, recurrence, worsening of fecal continence, damage to surrounding structures, need for additional procedures) benefits and alternatives were discussed. Her questions were answered to her satisfaction and she elected to proceed with surgery -Plan to hold blood thinner for 5-7 days after surgery. If no bleeding with BMs at 5days, ok to resume - all of this was discussed with the patient and her friend who has been helping her, Barnetta Chapel.  Sharon Mt. Dema Severin, M.D. General and Colorectal Surgery [facility]

## 2017-09-03 NOTE — Transfer of Care (Signed)
Immediate Anesthesia Transfer of Care Note  Patient: Bethany Oconnor  Procedure(s) Performed: Procedure(s): EXAM UNDER ANESTHESIA, EXCISION OF ANAL LESION, POSSIBLE HEMORRHOIDECTOMY (N/A)  Patient Location: PACU  Anesthesia Type:General  Level of Consciousness:  sedated, patient cooperative and responds to stimulation  Airway & Oxygen Therapy:Patient Spontanous Breathing and Patient connected to face mask oxgen  Post-op Assessment:  Report given to PACU RN and Post -op Vital signs reviewed and stable  Post vital signs:  Reviewed and stable  Last Vitals:  Vitals:   09/03/17 0758  BP: (!) 144/70  Pulse: 73  Resp: 16  Temp: 36.6 C  SpO2: 83%    Complications: No apparent anesthesia complications

## 2017-09-03 NOTE — Discharge Instructions (Addendum)
ANORECTAL SURGERY: POST OP INSTRUCTIONS  1. DIET: Follow a light bland diet the first 24 hours after arrival home, such as soup, liquids, crackers, etc.  Be sure to include lots of fluids daily.  Avoid fast food or heavy meals as your are more likely to get nauseated.  Eat a low fat diet the next few days after surgery.    2. Take your usually prescribed home medications unless otherwise directed.  3. PAIN CONTROL: a. It is helpful to take an over-the-counter pain medication regularly for the first few days/weeks.  Choose from the following that works best for you: i. Ibuprofen (Advil, etc) Three 200mg  tabs every 6 hours as needed. ii. Acetaminophen (Tylenol, etc) 500-650mg  every 6 hours as needed iii. NOTE: You may take both of these medications together - most patients find it most helpful when alternating between the two (i.e. Ibuprofen at 6am, tylenol at 9am, ibuprofen at 12pm ...) b. A  prescription for pain medication may have been prescribed for you at discharge.  Take your pain medication as prescribed.  i. If you are having problems/concerns with the prescription medicine, please call us for further advice.  4. Avoid getting constipated.  Between the surgery and the pain medications, it is common to experience some constipation.  Increasing fluid intake (64oz of water per day) and taking a fiber supplement (such as Metamucil, Citrucel, FiberCon) 1-2 times a day regularly will usually help prevent this problem from occurring.  Take Miralax (over the counter) 1-2x/day while taking a narcotic pain medication. If no bowel movement after 48hours, you may additionally take a laxative like a bottle of Milk of Magnesia which can be purchased over the counter. Avoid enemas if possible as these are often painful.   5. Watch out for diarrhea.  If you have many loose bowel movements, simplify your diet to bland foods.  Stop any stool softeners and decrease your fiber supplement. If this worsens or does  not improve, please call us.  6. Wash / shower every day.  If you were discharged with a dressing, you may remove this the day after your surgery. You may shower normally, getting soap/water on your wound, particularly after bowel movements.  7. Soaking in a warm bath filled a couple inches ("Sitz bath") is a great way to clean the area after a bowel movement and many patients find it is a way to soothe the area.  8. ACTIVITIES as tolerated:   a. You may resume regular (light) daily activities beginning the next day--such as daily self-care, walking, climbing stairs--gradually increasing activities as tolerated.  If you can walk 30 minutes without difficulty, it is safe to try more intense activity such as jogging, treadmill, bicycling, low-impact aerobics, etc. b. Refrain from any heavy lifting or straining for the first 2 weeks after your procedure, particularly if your surgery was for hemorrhoids. c. Avoid activities that make your pain worse d. You may drive when you are no longer taking prescription pain medication, you can comfortably wear a seatbelt, and you can safely maneuver your car and apply brakes.   Pradaxa: You may resume your blood thinner in 1 week (7 days).  9. FOLLOW UP in our office a. Please call [facility] at [phone] to set up an appointment to see your surgeon in the office for a follow-up appointment approximately 2 weeks after your surgery. b. Make sure that you call for this appointment the day you arrive home to insure a convenient appointment time.  9. If you have disability or family leave forms that need to be completed, you may have them completed by your primary care physician's office; for return to work instructions, please ask our office staff and they will be happy to assist you in obtaining this documentation   When to call us [phone]: 1. Poor pain control 2. Reactions / problems with new medications (rash/itching, etc)  3. Fever over 101.5 F (38.5  C) 4. Inability to urinate 5. Nausea/vomiting 6. Worsening swelling or bruising 7. Continued bleeding from incision. 8. Increased pain, redness, or drainage from the incision  The clinic staff is available to answer your questions during regular business hours (8:30am-5pm).  Please dont hesitate to call and ask to speak to one of our nurses for clinical concerns.   A surgeon from [facility] is always on call at the hospitals   If you have a medical emergency, go to the nearest emergency room or call 911.   [facility], [address], [place], [place], [place]  [address] ? MAIN: [phone] FAX [phone] [email or web address]

## 2017-09-03 NOTE — Anesthesia Preprocedure Evaluation (Addendum)
Anesthesia Evaluation  Patient identified by MRN, date of birth, ID band Patient awake    Reviewed: Allergy & Precautions, NPO status , Patient's Chart, lab work & pertinent test results  History of Anesthesia Complications Negative for: history of anesthetic complications  Airway Mallampati: II  TM Distance: >3 FB Neck ROM: Full    Dental  (+) Dental Advisory Given, Caps   Pulmonary former smoker (quit 1990),    Pulmonary exam normal breath sounds clear to auscultation       Cardiovascular hypertension, Pt. 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)  Rhythm:Irregular Rate:Normal  '17 ECHO: EF 50-55%, mild MR   Neuro/Psych Anxiety Depression namendaCerebral aneurysm: s/p coiling    GI/Hepatic negative GI ROS, Neg liver ROS,   Endo/Other  negative endocrine ROS  Renal/GU negative Renal ROS     Musculoskeletal   Abdominal   Peds  Hematology  (+) Blood dyscrasia (CLL), , pradaxa   Anesthesia Other Findings   Reproductive/Obstetrics                            Anesthesia Physical Anesthesia Plan  ASA: III  Anesthesia Plan: MAC   Post-op Pain Management:    Induction: Intravenous  PONV Risk Score and Plan: 2 and Ondansetron, Dexamethasone and Treatment may vary due to age or medical condition  Airway Management Planned: Oral ETT  Additional Equipment:   Intra-op Plan:   Post-operative Plan: Extubation in OR  Informed Consent: I have reviewed the patients History and Physical, chart, labs and discussed the procedure including the risks, benefits and alternatives for the proposed anesthesia with the patient or authorized representative who has indicated his/her understanding and acceptance.   Dental advisory given  Plan Discussed with: CRNA and Surgeon  Anesthesia Plan Comments: (Plan routine  monitors, GETA)        Anesthesia Quick Evaluation

## 2017-10-21 ENCOUNTER — Ambulatory Visit: Payer: Medicare Other | Attending: Internal Medicine | Admitting: Physical Therapy

## 2017-10-21 ENCOUNTER — Encounter: Payer: Self-pay | Admitting: Physical Therapy

## 2017-10-21 ENCOUNTER — Other Ambulatory Visit: Payer: Self-pay

## 2017-10-21 DIAGNOSIS — R42 Dizziness and giddiness: Secondary | ICD-10-CM | POA: Insufficient documentation

## 2017-10-21 DIAGNOSIS — R2689 Other abnormalities of gait and mobility: Secondary | ICD-10-CM | POA: Diagnosis present

## 2017-10-22 NOTE — Therapy (Signed)
[facility] [address] [place], [place], [address] Phone: [phone]   Fax:  [phone]  Physical Therapy Evaluation  Patient Details  Name: Bethany Oconnor MRN: [id number] Date of Birth: 1941/02/16 Referring Provider: Dr. Crist Infante   Encounter Date: 10/21/2017  PT End of Session - 10/22/17 1450    Visit Number  1    Authorization Type  [employer] Medicare    PT Start Time  1025    PT Stop Time  1402    PT Time Calculation (min)  45 min       Past Medical History:  Diagnosis Date  . Anxiety   . Cerebral aneurysm    s/p endovascular colling 2008  . Depression   . Hearing loss   . Macular degeneration   . Neuropathy   . Paroxysmal atrial fibrillation (HCC)   . Pneumonia    08-04-17    Past Surgical History:  Procedure Laterality Date  . ANEURYSM COILING    . CATARACT EXTRACTION     bilateral  . CERVICAL CONE BIOPSY    . EVALUATION UNDER ANESTHESIA WITH HEMORRHOIDECTOMY N/A 09/03/2017   Procedure: EXAM UNDER ANESTHESIA, EXCISION OF ANAL LESION, POSSIBLE HEMORRHOIDECTOMY;  Surgeon: Ileana Roup, MD;  Location: [facility];  Service: General;  Laterality: N/A;  . THYROIDECTOMY    . TONSILLECTOMY      There were no vitals filed for this visit.   Subjective Assessment - 10/22/17 1436    Subjective  Pt reports dizziness is constant and does not know where it is coming from:  saw Dr. Redmond Baseman at end of Jan. and  states he ruled out any problem with her ears;  he referred her to vestibular rehab; she is accompanied by caregiver as she has macular degeneration;  pt reports she woke up one maorning in mid. Jan. and noticed a signficant decline in her vision - went to her eye doctor who treats her macular degeneration and he found no change in vision    Pertinent History  Macular degeneration; depression since husband's death in 03-Apr-2017 with trial of different anti-depressant medications which have possibly had  side effect of dizziness    Patient Stated Goals  try to reduce the dizziness         [facility] PT Assessment - 10/22/17 0001      Assessment   Medical Diagnosis  Vertigo    Referring Provider  Dr. Crist Infante      Precautions   Precautions  Fall      Balance Screen   Has the patient fallen in the past 6 months  Yes    How many times?  1 pt states she passed out about a week ago    Has the patient had a decrease in activity level because of a fear of falling?   Yes    Is the patient reluctant to leave their home because of a fear of falling?   No      Home Environment   Living Environment  Assisted living    [place] - 2 wheels    Additional Comments  pt lives at [facility]   Level of Independence  Needs assistance with ADLs;Independent with household mobility with device;Needs assistance with homemaking    Comments  pt has 24 hour care due to blindness         Vestibular Assessment - 10/22/17 0001  Vestibular Assessment   General Observation  Pt is a 77 yr old female with macular degeneration who reports she is having constant dizziness and states she has had a signficant change /loss of vision since mid-Jan. 2019; this is the same time frame that her Wellbutrin medication dosage was increased from 150 to 300 mg per day; etiology of vertigo is unknown at this time      Symptom Behavior   Type of Dizziness  Blurred vision    Frequency of Dizziness  constant    Duration of Dizziness  constant    Aggravating Factors  No known aggravating factors    Relieving Factors  No known relieving factors      Occulomotor Exam   Occulomotor Alignment  Abnormal    Spontaneous  Absent    Smooth Pursuits  Comment pt unable to track moving pen    Saccades  -- unable to track moving pen      Positional Testing   Dix-Hallpike  Dix-Hallpike Right;Dix-Hallpike Left    Sidelying Test  Sidelying Right      Dix-Hallpike Right   Dix-Hallpike Right  Duration  no increased spinning vertigo noted    Dix-Hallpike Right Symptoms  No nystagmus      Dix-Hallpike Left   Dix-Hallpike Left Duration  no nystagmus noted - no incr. vertiog reported    Dix-Hallpike Left Symptoms  No nystagmus      Sidelying Right   Sidelying Right Duration  no incr. spinning vertigo reported    Sidelying Right Symptoms  No nystagmus      Sidelying Left   Sidelying Left Duration  no change in vertigo reported    Sidelying Left Symptoms  No nystagmus         Objective measurements completed on examination: See above findings.              PT Education - 10/22/17 1449    Education provided  Yes    Education Details  eval results with pt and caregiver; instructed pt to discuss correlation of incr. Wellbutrin dosage with c/o incr. visual deficits/dizziness    Person(s) Educated  Patient;Caregiver(s)    Methods  Explanation    Comprehension  Verbalized understanding                  Plan - 10/22/17 1451    Clinical Impression Statement  Pt is a 77 yr old lady with c/o constant dizziness since last summer and recent decline in visual deficits that she says started in mid-January 2019; her Wellbutrin dosage was increased from 150 to 300 mg during this same time frame.  Pt unable to visually track object due to macular degeneration and no positions or movements increase the vertigo; pt states vertigo is constant and atributes it to her visual deficits.  Vertigo does not appear to be of vestibular dysfunction etiology at this time but may be side effect of some of her medications.  Pt is unable to perform oculomotor exercise and oculomotor testing was unable to be completed as she is unable to perform visual tracking.   Pt would benefit from PT for balance and gait training but she states whe wishes to receive PT at [facility] as they have PT on site and she does not want to travel to [facility] for the same service    History and Personal Factors  relevant to plan of care:  Depression; dementia;  Macular Degeneration with pt reporting she is blind    Clinical  Presentation  Evolving    Clinical Presentation due to:  depression/side effects of medications?  Visual deficits due to macular degeneration; vertigo etiology is unknown    Clinical Decision Making  Moderate    Clinical Impairments Affecting Rehab Potential  dementia; visual deficits due to macular degeneration    PT Frequency  One time visit eval only as pt wants to receive PT at [facility]    PT Treatment/Interventions  Vestibular;Neuromuscular re-education;ADLs/Self Care Home Management;Patient/family education    PT Next Visit Plan  N/A - pt wishes to pursue getting PT at [facility] so that she will not have to travel to [facility] - eval only    Consulted and Agree with Plan of Care  Patient;Other (Comment)       Patient will benefit from skilled therapeutic intervention in order to improve the following deficits and impairments:  Dizziness, Decreased balance, Difficulty walking  Visit Diagnosis: Dizziness and giddiness - Plan: PT plan of care cert/re-cert  Other abnormalities of gait and mobility - Plan: PT plan of care cert/re-cert     Pt to pursue receiving PT at [facility] per her request in order to not have to travel to receive PT - as they have this service on site   Problem List Patient Active Problem List   Diagnosis Date Noted  . Cerebral aneurysm   . Dementia with behavioral disturbance 12/20/2016  . Aggressive behavior   . CLL (chronic lymphocytic leukemia) ([place]) 09/14/2014  . 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 HYPERTENSION, BENIGN 05/31/2010  . ATRIAL FIBRILLATION 05/31/2010    [place], PT 10/22/2017, 3:08 PM  [facility] [address] [place], [place], [address] Phone: [phone]   Fax:  [phone]  Name: JOVANNI ECKHART MRN: [id number] Date  of Birth: 1941/03/09

## 2017-10-22 NOTE — Progress Notes (Signed)
[facility]  [phone] [phone]  Clinic Follow up Note   Patient Care Team: Crist Infante, MD as PCP - General (Internal Medicine) Crist Infante, MD (Internal Medicine) Newton Pigg, MD as Consulting Physician (Obstetrics and Gynecology) Sherren Mocha, MD as Consulting Physician (Cardiology) Truitt Merle, MD as Consulting Physician (Hematology) 10/24/2017  CHIEF COMPLAINTS Follow-up CLL, stage 0  HISTORY OF INITIAL PRESENTING ILLNESS (08/16/2014):  Bethany Oconnor 77 y.o. female is here because of her asymptomatic lymphocytosis which was found on routine blood test.  Per patient, her white count was slightly elevated 1 year ago on the routine lab test. He had repeated lab test on 07/22/2014 by her primary care physician Dr. Joylene Draft. It showed WBC 13.3 K, absolute lymphocyte count 9.4K, hemoglobin 12.4, and platelet count 270 8K. Her TSH was normal. She was then referred to our cancer center for further evaluation.  She feels well in general, has no particular complaints. She denies any fever or chill, no night sweats, no weight loss in the past 5 years. She has macular degeneration, and she cannot see since very well. She denies any particular pain, cough, dyspnea, GI or other symptoms.  CURRENT THERAPY: Observation  INTERIM HISTORY Bethany Oconnor returns for follow-up. She presents with her caregiver from [facility] where she lives. She reports she is still very depressed due to her husband's death in 31-Jan-2017, decreased vision, and dizziness. She recently had a hemmoroid surgery and she states she is still uncomfortable. She has a follow up with Dr. Dema Severin after this.   On review of systems, pt denies fever, or any other complaints at this time. Pertinent positives are listed and detailed within the above HPI.   MEDICAL HISTORY:  Past Medical History:  Diagnosis Date  . Anxiety   . Cerebral aneurysm    s/p endovascular colling 2008  .  Depression   . Hearing loss   . Macular degeneration   . Neuropathy   . Paroxysmal atrial fibrillation (HCC)   . Pneumonia    08-04-17   SURGICAL HISTORY: Past Surgical History:  Procedure Laterality Date  . ANEURYSM COILING    . CATARACT EXTRACTION     bilateral  . CERVICAL CONE BIOPSY    . EVALUATION UNDER ANESTHESIA WITH HEMORRHOIDECTOMY N/A 09/03/2017   Procedure: EXAM UNDER ANESTHESIA, EXCISION OF ANAL LESION, POSSIBLE HEMORRHOIDECTOMY;  Surgeon: Ileana Roup, MD;  Location: [facility];  Service: General;  Laterality: N/A;  . THYROIDECTOMY    . TONSILLECTOMY     SOCIAL HISTORY: Social History   Socioeconomic History  . Marital status: Widowed    Spouse name: Not on file  . Number of children: 2  . Years of education: Not on file  . Highest education level: Not on file  Social Needs  . Financial resource strain: Not on file  . Food insecurity - worry: Not on file  . Food insecurity - inability: Not on file  . Transportation needs - medical: Not on file  . Transportation needs - non-medical: Not on file  Occupational History    Employer: RETIRED  Tobacco Use  . Smoking status: Former Smoker    Last attempt to quit: 09/02/1988    Years since quitting: 29.1  . Smokeless tobacco: Never Used  Substance and Sexual Activity  . Alcohol use: No    Alcohol/week: 0.6 - 1.2 oz    Types: 1 - 2 Glasses of wine per week    Comment: daily for 50  years  No longer drinks 09-01-17  . Drug use: No  . Sexual activity: Not Currently  Other Topics Concern  . Not on file  Social History Narrative  . Not on file    FAMILY HISTORY: Family History  Problem Relation Age of Onset  . Cancer Father 98       died  . Cerebral aneurysm Mother 31       died  . Heart attack Brother     ALLERGIES:  is allergic to [employer] tartrate]; fioricet-codeine [butalbital-apap-caff-cod]; macrobid [nitrofurantoin]; codeine; and keflex [cephalexin].  MEDICATIONS:  Current Outpatient  Medications  Medication Sig Dispense Refill  . acetaminophen (TYLENOL) 325 MG tablet Take 650 mg by mouth every 6 (six) hours as needed (pain).    Marland Kitchen buPROPion (WELLBUTRIN XL) 150 MG 24 hr tablet Take 300 mg by mouth daily.     . Calcium Carbonate-Vitamin D 600-400 MG-UNIT per tablet Take 1 tablet by mouth daily.    . COMBIGAN 0.2-0.5 % ophthalmic solution Apply 2 drops to eye 2 (two) times daily. Left Eye    . dabigatran (PRADAXA) 150 MG CAPS capsule Take 1 capsule (150 mg total) by mouth every 12 (twelve) hours. 180 capsule 3  . diltiazem (CARDIZEM CD) 180 MG 24 hr capsule TAKE ONE CAPSULE EACH DAY 90 capsule 3  . donepezil (ARICEPT) 5 MG tablet Take 5 mg by mouth at bedtime.    Marland Kitchen latanoprost (XALATAN) 0.005 % ophthalmic solution Place 2 drops into the left eye 2 (two) times daily.    Marland Kitchen LORazepam (ATIVAN) 0.5 MG tablet Take 0.5 mg by mouth every 8 (eight) hours as needed for anxiety.    . Melatonin 5 MG TABS Take 5 mg by mouth at bedtime.    . memantine (NAMENDA) 5 MG tablet Take 5 mg by mouth daily.    . Multiple Vitamins-Minerals (MULTIVITAMINS THER. W/MINERALS) TABS tablet Take 1 tablet by mouth daily.    . Multiple Vitamins-Minerals (PRESERVISION AREDS) CAPS Take 1 capsule daily by mouth.    . pravastatin (PRAVACHOL) 40 MG tablet Take 40 mg by mouth daily.     Marland Kitchen PREMPRO 0.625-5 MG per tablet Take 1 tablet by mouth daily.   3  . Probiotic Product (ALIGN) 4 MG CAPS Take 1 capsule by mouth daily.     No current facility-administered medications for this visit.     REVIEW OF SYSTEMS:  Constitutional: Denies fevers, chills or abnormal night sweats Eyes: (+) decreased vision Ears, nose, mouth, throat, and face: Denies mucositis or sore throat Respiratory: Denies cough, dyspnea or wheezes Cardiovascular: Denies palpitation, chest discomfort or lower extremity swelling Gastrointestinal:  Denies nausea, heartburn or change in bowel habits Skin: Denies abnormal skin rashes Lymphatics: Denies  new lymphadenopathy or easy bruising Neurological:Denies numbness, tingling or new weaknesses (+) dizziness Behavioral/Psych: (+) depression All other systems were reviewed with the patient and are negative.  PHYSICAL EXAMINATION:  ECOG PERFORMANCE STATUS: 3  Vitals:   10/24/17 1305  BP: 137/64  Pulse: 79  Resp: 20  Temp: 97.7 F (36.5 C)  SpO2: 100%   Filed Weights   10/24/17 1305  Weight: 176 lb 9.6 oz (80.1 kg)   GENERAL:alert, no distress and comfortable SKIN: skin color, texture, turgor are normal, no rashes or significant lesions EYES: normal, conjunctiva are pink and non-injected, sclera clear OROPHARYNX:no exudate, no erythema and lips, buccal mucosa, and tongue normal  NECK: supple, thyroid normal size, non-tender, without nodularity LYMPH:  no palpable lymphadenopathy in the  cervical, axillary or inguinal LUNGS: clear to auscultation and percussion with normal breathing effort HEART: regular rate & rhythm and no murmurs and no lower extremity edema ABDOMEN:abdomen soft, non-tender and normal bowel sounds Musculoskeletal:no cyanosis of digits and no clubbing  PSYCH: alert & oriented x 3 with fluent speech NEURO: no focal motor/sensory deficits  LABORATORY DATA:  I have reviewed the data as listed CBC Latest Ref Rng & Units 10/24/2017 09/01/2017 07/28/2017  WBC 3.9 - 10.3 K/uL 17.5(H) 20.5(H) 19.8(H)  Hemoglobin 11.6 - 15.9 g/dL 12.9 13.7 13.6  Hematocrit 34.8 - 46.6 % 39.3 41.1 40.8  Platelets 145 - 400 K/uL 193 182 171    CMP Latest Ref Rng & Units 10/24/2017 09/01/2017 06/16/2017  Glucose 70 - 140 mg/dL 130 138(H) 97  BUN 7 - 26 mg/dL 9 12 11   Creatinine 0.60 - 1.10 mg/dL 0.83 0.88 0.89  Sodium 136 - 145 mmol/L 140 140 140  Potassium 3.5 - 5.1 mmol/L 4.0 4.2 4.0  Chloride 98 - 109 mmol/L 106 110 107  CO2 22 - 29 mmol/L 25 23 23   Calcium 8.4 - 10.4 mg/dL 9.3 9.0 8.9  Total Protein 6.4 - 8.3 g/dL 6.4 6.5 -  Total Bilirubin 0.2 - 1.2 mg/dL 0.3 0.6 -    Alkaline Phos 40 - 150 U/L 67 58 -  AST 5 - 34 U/L 14 28 -  ALT 0 - 55 U/L 15 16 -     ALC 11.9K today   Flow cytometry 08/16/2014 Peripheral Blood Flow Cytometry - ABNORMAL B-CELL POPULATION DETECTED, SEE COMMENT. Diagnosis Comment: There is an abnormal population of B-cells with expression of B-cell markers including CD23. CD20 expression is dim and light chain expression is too dim for accurate assessment of clonality. There is no significant expression of CD5 or CD10. Markers for hairy cell leukemia are negative. Review of the peripheral blood reveals a lymphocytosis with predominately small lymphocytes with coarse chromatin and scant cytoplasm. 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 The expression of CD23, dim CD20, dim light chains, and morphology are most suggestive of chronic lymphocytic leukemia, however, CD5 is negative which is rather uncommon. Correlation with cytogenetics may be of some utility.  RADIOGRAPHIC STUDIES: I have personally reviewed the radiological images as listed and agreed with the findings in the report.  CT chest, abdomen and pelvis on 09/06/2014 IMPRESSION: No evidence of mass, lymphadenopathy, or other acute findings.  Mild hepatic steatosis and colonic diverticulosis incidentally noted.  ASSESSMENT & PLAN:  77 y.o. Caucasian female with past medical history of brain aneurysm, hypertension, borderline diabetes, macular degeneration, hearing loss, who was incidentally found to have leukocytosis on routine lab since 2015.  1. Chronic ascitic leukemia (CLL), stage 0  -I previously reviewed her flow cytometry results with her and her husband. This is most consistent with CLL. She previously only had mild lymphocytosis at 9.5K, no adenopathy or splenomegaly by physical exam and CAT scan. No anemia and thrombocytopenia. -She feels well without any symptoms. I previously recommended observation at this stage. I  explained to her she has very early stage disease, and it could be a very intercurrent course and she may not treatment for long time.  The treatment indications include symptomatic CLL, advanced stage or rapid lymphocytes doubling time. -FISH panel includes del(17p), del(11q), trisomy 12, and del(13q) , beta-2 microglobulin normal, ZAP70 (-)  -She is clinically doing well except that her depression, I reviewed her lab results, her [place] today is 11.9K,  slightly lower than last time, no anemia or thrombocytopenia. I reviewed her lab results with her. Her physical exam was also unremarkable -No need for treatment at this point, we'll continue monitoring. -Will have her f/u here in 6 months for routine f/u and lab to continue monitoring.   2. Hypertension and other comorbidity She will continue follow-up with her primary care physician Dr. Joylene Draft.  3. AF -she is on Pradaxa, follow-up with her cardiologist.  4. Immunizations -We discussed that CLL patients has compromised immune system, I strongly encouraged her to keep her vaccination up-to-date -She has received flu shot and pneumonia [facility].  5. Depression  -Related to her husband's recent death -Beginning Zoloft on 05/21/17 , encouraged her to maintain close f/u w/ her PCP and Psychiatrist.  -she has care-giver and sitter at home   [place] reviewed; stable.  -Lab and F/u in 6 months with Lacie   All questions were answered. The patient knows to call the clinic with any problems, questions or concerns.  I spent 15 minutes counseling the patient face to face. The total time spent in the appointment was 20 minutes and more than 50% was on counseling.  This document serves as a record of services personally performed by Truitt Merle, MD. It was created on her behalf by Theresia Bough, a trained medical scribe. The creation of this record is based on the scribe's personal observations and the provider's statements to  them.   I have reviewed the above documentation for accuracy and completeness, and I agree with the above.    Truitt Merle, MD 10/24/2017

## 2017-10-24 ENCOUNTER — Inpatient Hospital Stay: Payer: Medicare Other | Attending: Hematology | Admitting: Hematology

## 2017-10-24 ENCOUNTER — Inpatient Hospital Stay: Payer: Medicare Other

## 2017-10-24 ENCOUNTER — Encounter: Payer: Self-pay | Admitting: Hematology

## 2017-10-24 ENCOUNTER — Telehealth: Payer: Self-pay

## 2017-10-24 VITALS — BP 137/64 | HR 79 | Temp 97.7°F | Resp 20 | Ht 65.0 in | Wt 176.6 lb

## 2017-10-24 DIAGNOSIS — C9110 Chronic lymphocytic leukemia of B-cell type not having achieved remission: Secondary | ICD-10-CM | POA: Insufficient documentation

## 2017-10-24 DIAGNOSIS — F329 Major depressive disorder, single episode, unspecified: Secondary | ICD-10-CM

## 2017-10-24 DIAGNOSIS — Z87891 Personal history of nicotine dependence: Secondary | ICD-10-CM | POA: Insufficient documentation

## 2017-10-24 DIAGNOSIS — I10 Essential (primary) hypertension: Secondary | ICD-10-CM

## 2017-10-24 LAB — COMPREHENSIVE METABOLIC PANEL
ALBUMIN: 3.4 g/dL — AB (ref 3.5–5.0)
ALT: 15 U/L (ref 0–55)
AST: 14 U/L (ref 5–34)
Alkaline Phosphatase: 67 U/L (ref 40–150)
Anion gap: 9 (ref 3–11)
BILIRUBIN TOTAL: 0.3 mg/dL (ref 0.2–1.2)
BUN: 9 mg/dL (ref 7–26)
CHLORIDE: 106 mmol/L (ref 98–109)
CO2: 25 mmol/L (ref 22–29)
Calcium: 9.3 mg/dL (ref 8.4–10.4)
Creatinine, Ser: 0.83 mg/dL (ref 0.60–1.10)
GFR calc Af Amer: >60 mL/min (ref >60)
GFR calc non Af Amer: >60 mL/min (ref >60)
GLUCOSE: 130 mg/dL (ref 70–140)
POTASSIUM: 4 mmol/L (ref 3.5–5.1)
SODIUM: 140 mmol/L (ref 136–145)
TOTAL PROTEIN: 6.4 g/dL (ref 6.4–8.3)

## 2017-10-24 LAB — CBC WITH DIFFERENTIAL/PLATELET
Basophils Absolute: 0.1 10*3/uL (ref 0.0–0.1)
Basophils Relative: 0 %
Eosinophils Absolute: 0.1 10*3/uL (ref 0.0–0.5)
Eosinophils Relative: 1 %
HEMATOCRIT: 39.3 % (ref 34.8–46.6)
HEMOGLOBIN: 12.9 g/dL (ref 11.6–15.9)
LYMPHS PCT: 68 %
Lymphs Abs: 11.9 10*3/uL — ABNORMAL HIGH (ref 0.9–3.3)
MCH: 32 pg (ref 25.1–34.0)
MCHC: 33 g/dL (ref 31.5–36.0)
MCV: 97.2 fL (ref 79.5–101.0)
MONO ABS: 0.9 10*3/uL (ref 0.1–0.9)
MONOS PCT: 5 %
NEUTROS ABS: 4.5 10*3/uL (ref 1.5–6.5)
NEUTROS PCT: 26 %
Platelets: 193 10*3/uL (ref 145–400)
RBC: 4.04 MIL/uL (ref 3.70–5.45)
RDW: 14.5 % (ref 11.2–14.5)
WBC: 17.5 10*3/uL — ABNORMAL HIGH (ref 3.9–10.3)

## 2017-10-24 NOTE — Telephone Encounter (Signed)
Printed avs and calender of upcoming appointment. Per 2/22 los 

## 2017-11-11 LAB — HEPATIC FUNCTION PANEL
ALT: 13 (ref 7–35)
AST: 16 (ref 13–35)
Alkaline Phosphatase: 65 (ref 25–125)
BILIRUBIN, TOTAL: 0.5

## 2017-11-11 LAB — MICROALBUMIN, URINE: MICROALB UR: 7

## 2017-11-11 LAB — BASIC METABOLIC PANEL
BUN: 12 (ref 4–21)
Creatinine: 0.8 (ref 0.5–1.1)
Glucose: 90
POTASSIUM: 3.8 (ref 3.4–5.3)
SODIUM: 142 (ref 137–147)

## 2017-11-11 LAB — CBC AND DIFFERENTIAL
HEMATOCRIT: 42 (ref 36–46)
Hemoglobin: 13.8 (ref 12.0–16.0)
PLATELETS: 213 (ref 150–399)
WBC: 19.5

## 2017-11-11 LAB — VITAMIN D 25 HYDROXY (VIT D DEFICIENCY, FRACTURES): Vit D, 25-Hydroxy: 41.7

## 2017-11-11 LAB — LIPID PANEL
Cholesterol: 146 (ref 0–200)
HDL: 42 (ref 35–70)
LDL CALC: 63
TRIGLYCERIDES: 207 — AB (ref 40–160)

## 2017-11-11 LAB — TSH: TSH: 1.78 (ref 0.41–5.90)

## 2017-11-11 LAB — HM DEXA SCAN

## 2017-11-11 LAB — HEMOGLOBIN A1C: Hgb A1c MFr Bld: 5.6 (ref 4.0–6.0)

## 2017-12-18 ENCOUNTER — Encounter (INDEPENDENT_AMBULATORY_CARE_PROVIDER_SITE_OTHER): Payer: Self-pay | Admitting: Surgery

## 2017-12-18 ENCOUNTER — Ambulatory Visit (INDEPENDENT_AMBULATORY_CARE_PROVIDER_SITE_OTHER): Payer: Medicare Other | Admitting: Surgery

## 2017-12-18 ENCOUNTER — Ambulatory Visit (INDEPENDENT_AMBULATORY_CARE_PROVIDER_SITE_OTHER): Payer: Medicare Other

## 2017-12-18 VITALS — Ht 65.0 in | Wt 176.6 lb

## 2017-12-18 DIAGNOSIS — M5442 Lumbago with sciatica, left side: Secondary | ICD-10-CM | POA: Diagnosis not present

## 2017-12-18 DIAGNOSIS — R2681 Unsteadiness on feet: Secondary | ICD-10-CM

## 2017-12-18 DIAGNOSIS — R42 Dizziness and giddiness: Secondary | ICD-10-CM | POA: Diagnosis not present

## 2017-12-18 DIAGNOSIS — M5441 Lumbago with sciatica, right side: Secondary | ICD-10-CM

## 2017-12-18 NOTE — Progress Notes (Signed)
Office Visit Note   Patient: Bethany Oconnor           Date of Birth: Jan 07, 1941           MRN: [id number] Visit Date: 12/18/2017              Requested by: Crist Infante, MD [address] [place], [place] [address] PCP: Crist Infante, MD   Assessment & Plan: Visit Diagnoses:  1. Acute midline low back pain with bilateral sciatica   2. Unsteady gait   3. Dizziness     Plan: With patient's ongoing symptoms I will schedule lumbar MRI to rule out HNP/stenosis.  Follow-up with Dr. Louanne Skye after completion to discuss results and further treatment options.  Patient also complained of dizziness and she should discuss this with her primary care physician.  Follow-Up Instructions: Return in about 3 weeks (around 01/08/2018) for With Dr. Louanne Skye to review lumbar MRI.   Orders:  Orders Placed This Encounter  Procedures  . XR Lumbar Spine 2-3 Views  . MR Lumbar Spine w/o contrast   No orders of the defined types were placed in this encounter.     Procedures: No procedures performed   Clinical Data: No additional findings.   Subjective: Chief Complaint  Patient presents with  . Lower Back - Pain    HPI 77 year old female is being seen at the request of Dr. Hardie Shackleton for low back pain and lower extremity radiculopathy.  States that she has had increased symptoms x2 months and this is worsening.  Pain across low back that radiates down the right greater than left buttock and into her feet.  Numbness and tingling also in both legs.  Walking distances have greatly decreased over the last several months due to her symptoms.  No previous surgery.  Has not had epidurals.  Patient ambulates with a rolling walker. Review of Systems No current cardiac pulmonary GI GU issues  Objective: Vital Signs: Ht 5\' 5"  (1.651 m)   Wt 176 lb 9.6 oz (80.1 kg)   BMI 29.39 kg/m   Physical Exam  Constitutional: She is oriented to person, place, and time. No distress.  HENT:  Head: Normocephalic and  atraumatic.  Eyes: EOM are normal.  Pulmonary/Chest: No respiratory distress.  Musculoskeletal:  Gait is somewhat antalgic.  Positive lumbar paraspinal tenderness.  Positive right greater than left sciatic notch tenderness.  Positive right straight leg raise.  Negative logroll bilateral hips.  Question trace right anterior tib weakness.  Bilateral calves nontender.  Neurological: She is alert and oriented to person, place, and time.  Skin: Skin is warm and dry.  Psychiatric: She has a normal mood and affect.    Ortho Exam  Specialty Comments:  No specialty comments available.  Imaging: No results found.   PMFS History: Patient Active Problem List   Diagnosis Date Noted  . Cerebral aneurysm   . Dementia with behavioral disturbance 12/20/2016  . Aggressive behavior   . CLL (chronic lymphocytic leukemia) ([place]) 09/14/2014  . 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 HYPERTENSION, BENIGN 05/31/2010  . ATRIAL FIBRILLATION 05/31/2010   Past Medical History:  Diagnosis Date  . Anxiety   . Cerebral aneurysm    s/p endovascular colling 2008  . Depression   . Hearing loss   . Macular degeneration   . Neuropathy   . Paroxysmal atrial fibrillation (HCC)   . Pneumonia    08-04-17    Family History  Problem Relation Age of Onset  .  Cancer Father 50       died  . Cerebral aneurysm Mother 43       died  . Heart attack Brother     Past Surgical History:  Procedure Laterality Date  . ANEURYSM COILING    . CATARACT EXTRACTION     bilateral  . CERVICAL CONE BIOPSY    . EVALUATION UNDER ANESTHESIA WITH HEMORRHOIDECTOMY N/A 09/03/2017   Procedure: EXAM UNDER ANESTHESIA, EXCISION OF ANAL LESION, POSSIBLE HEMORRHOIDECTOMY;  Surgeon: Ileana Roup, MD;  Location: [facility];  Service: General;  Laterality: N/A;  . THYROIDECTOMY    . TONSILLECTOMY     Social History   Occupational History    Employer: RETIRED  Tobacco Use  . Smoking status: Former Smoker    Last  attempt to quit: 09/02/1988    Years since quitting: 29.3  . Smokeless tobacco: Never Used  Substance and Sexual Activity  . Alcohol use: No    Alcohol/week: 0.6 - 1.2 oz    Types: 1 - 2 Glasses of wine per week    Comment: daily for 50 years  No longer drinks 09-01-17  . Drug use: No  . Sexual activity: Not Currently

## 2017-12-21 ENCOUNTER — Other Ambulatory Visit: Payer: Self-pay

## 2017-12-21 ENCOUNTER — Emergency Department (HOSPITAL_COMMUNITY)
Admission: EM | Admit: 2017-12-21 | Discharge: 2017-12-21 | Disposition: A | Discharge status: Home / Self Care | Payer: Medicare Other | Attending: Emergency Medicine | Admitting: Emergency Medicine

## 2017-12-21 ENCOUNTER — Emergency Department (HOSPITAL_COMMUNITY): Payer: Medicare Other

## 2017-12-21 DIAGNOSIS — M5116 Intervertebral disc disorders with radiculopathy, lumbar region: Secondary | ICD-10-CM | POA: Insufficient documentation

## 2017-12-21 DIAGNOSIS — F0391 Unspecified dementia with behavioral disturbance: Secondary | ICD-10-CM | POA: Diagnosis not present

## 2017-12-21 DIAGNOSIS — M5127 Other intervertebral disc displacement, lumbosacral region: Secondary | ICD-10-CM

## 2017-12-21 DIAGNOSIS — Z87891 Personal history of nicotine dependence: Secondary | ICD-10-CM | POA: Diagnosis not present

## 2017-12-21 DIAGNOSIS — Z79899 Other long term (current) drug therapy: Secondary | ICD-10-CM | POA: Diagnosis not present

## 2017-12-21 DIAGNOSIS — I10 Essential (primary) hypertension: Secondary | ICD-10-CM | POA: Insufficient documentation

## 2017-12-21 DIAGNOSIS — M5489 Other dorsalgia: Secondary | ICD-10-CM | POA: Diagnosis present

## 2017-12-21 MED ORDER — OXYCODONE-ACETAMINOPHEN 5-325 MG PO TABS: 1.0000 | ORAL_TABLET | Freq: Four times a day (QID) | ORAL | 0 refills | Status: DC | PRN

## 2017-12-21 MED ORDER — OXYCODONE-ACETAMINOPHEN 5-325 MG PO TABS
1.0000 | ORAL_TABLET | Freq: Once | ORAL | Status: AC
  Administered 2017-12-21: 1 via ORAL
  Filled 2017-12-21: qty 1

## 2017-12-21 MED ORDER — ONDANSETRON 4 MG PO TBDP
4.0000 mg | ORAL_TABLET | Freq: Once | ORAL | Status: AC
  Administered 2017-12-21: 4 mg via ORAL
  Filled 2017-12-21: qty 1

## 2017-12-21 MED ORDER — DIAZEPAM 2 MG PO TABS
2.0000 mg | ORAL_TABLET | Freq: Once | ORAL | Status: AC
  Administered 2017-12-21: 2 mg via ORAL
  Filled 2017-12-21: qty 1

## 2017-12-21 NOTE — ED Triage Notes (Signed)
Ruptured disc at L4-L5? Pt was at [facility] and scheduled for MRI in 2 weeks. Pt unable to wait due to pain for scheduled imaging

## 2017-12-21 NOTE — ED Provider Notes (Signed)
[facility] DEPT Provider Note   CSN: [id number] Arrival date & time: 12/21/17  0754     History   Chief Complaint Chief Complaint  Patient presents with  . Back Pain    HPI Bethany Oconnor is a 77 y.o. female.  HPI Patient reports back pain for several weeks.  This is in the low back is a deep aching.  Worse to the right.  Pain radiates into both legs but right greater than left.  Patient reports she feels weak in the legs but has a hard time differentiating whether or not the weakness is on one side or the other.  She reports when she walks she has to be more careful.  Her physician has been evaluating her and got some x-rays.  She reports she was told that she had a ruptured disc.  She reports she was told that if it got much worse over Easter weekend to go to the emergency department to get an MRI.  Patient has been taking tramadol 100 mg with acetaminophen 1000 mg.  She gets some pain relief but remains in pain.  She denies any bowel or bladder incontinence.  No urinary retention.  No abdominal pain.  No nausea no vomiting.  No upper extremity symptoms. Past Medical History:  Diagnosis Date  . Anxiety   . Cerebral aneurysm    s/p endovascular colling 2008  . Depression   . Hearing loss   . Macular degeneration   . Neuropathy   . Paroxysmal atrial fibrillation (HCC)   . Pneumonia    08-04-17    Patient Active Problem List   Diagnosis Date Noted  . Cerebral aneurysm   . Dementia with behavioral disturbance 12/20/2016  . Aggressive behavior   . 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 HYPERTENSION, BENIGN 05/31/2010  . ATRIAL FIBRILLATION 05/31/2010    Past Surgical History:  Procedure Laterality Date  . ANEURYSM COILING    . CATARACT EXTRACTION     bilateral  . CERVICAL CONE BIOPSY    . EVALUATION UNDER ANESTHESIA WITH HEMORRHOIDECTOMY N/A 09/03/2017   Procedure:  EXAM UNDER ANESTHESIA, EXCISION OF ANAL LESION, POSSIBLE HEMORRHOIDECTOMY;  Surgeon: Ileana Roup, MD;  Location: [facility];  Service: General;  Laterality: N/A;  . THYROIDECTOMY    . TONSILLECTOMY       OB History   None      Home Medications    Prior to Admission medications   Medication Sig Start Date End Date Taking? Authorizing Provider  acetaminophen (TYLENOL) 325 MG tablet Take 650 mg by mouth every 6 (six) hours as needed (pain).    [provider]  buPROPion (WELLBUTRIN XL) 150 MG 24 hr tablet Take 300 mg by mouth daily.     [provider]  Calcium Carbonate-Vitamin D 600-400 MG-UNIT per tablet Take 1 tablet by mouth daily. 08/19/13   Sherren Mocha, MD  COMBIGAN 0.2-0.5 % ophthalmic solution Apply 2 drops to eye 2 (two) times daily. Left Eye 09/12/17   [provider]  dabigatran (PRADAXA) 150 MG CAPS capsule Take 1 capsule (150 mg total) by mouth every 12 (twelve) hours. 09/10/17   Ileana Roup, MD  diltiazem (CARDIZEM CD) 180 MG 24 hr capsule TAKE ONE CAPSULE EACH DAY 07/31/17   Sherren Mocha, MD  donepezil (ARICEPT) 5 MG tablet Take 5 mg by mouth at bedtime.    [provider]  latanoprost Ivin Poot)  0.005 % ophthalmic solution Place 2 drops into the left eye 2 (two) times daily.    [provider]  LORazepam (ATIVAN) 0.5 MG tablet Take 0.5 mg by mouth every 8 (eight) hours as needed for anxiety.    [provider]  Melatonin 5 MG TABS Take 5 mg by mouth at bedtime.    [provider]  memantine (NAMENDA) 5 MG tablet Take 5 mg by mouth daily.    [provider]  Multiple Vitamins-Minerals (MULTIVITAMINS THER. W/MINERALS) TABS tablet Take 1 tablet by mouth daily.    [provider]  Multiple Vitamins-Minerals (PRESERVISION AREDS) CAPS Take 1 capsule daily by mouth.    [provider]  oxyCODONE-acetaminophen (PERCOCET) 5-325 MG tablet Take 1-2 tablets by mouth every 6 (six)  hours as needed. 12/21/17   Charlesetta Shanks, MD  pravastatin (PRAVACHOL) 40 MG tablet Take 40 mg by mouth daily.  02/10/13   Sherren Mocha, MD  PREMPRO 0.625-5 MG per tablet Take 1 tablet by mouth daily.  08/22/14   [provider]  Probiotic Product (ALIGN) 4 MG CAPS Take 1 capsule by mouth daily.    [provider]    Family History Family History  Problem Relation Age of Onset  . Cancer Father 39       died  . Cerebral aneurysm Mother 37       died  . Heart attack Brother     Social History Social History   Tobacco Use  . Smoking status: Former Smoker    Last attempt to quit: 09/02/1988    Years since quitting: 29.3  . Smokeless tobacco: Never Used  Substance Use Topics  . Alcohol use: No    Alcohol/week: 0.6 - 1.2 oz    Types: 1 - 2 Glasses of wine per week    Comment: daily for 50 years  No longer drinks 09-01-17  . Drug use: No     Allergies   Ambien [zolpidem tartrate]; Fioricet-codeine [butalbital-apap-caff-cod]; Macrobid [nitrofurantoin]; Codeine; and Keflex [cephalexin]   Review of Systems Review of Systems 10 Systems reviewed and are negative for acute change except as noted in the HPI.   Physical Exam Updated Vital Signs BP (!) 150/80 (BP Location: Left Arm)   Pulse 80   Temp 98.5 F (36.9 C) (Oral)   Resp 16   Ht 5\' 5"  (1.651 m)   Wt 79.8 kg (176 lb)   SpO2 96%   BMI 29.29 kg/m   Physical Exam  Constitutional: She is oriented to person, place, and time. She appears well-developed and well-nourished.  HENT:  Head: Normocephalic and atraumatic.  Eyes: EOM are normal.  Neck: Neck supple.  Patient endorses tenderness along the trapezius on both sides of the cervical spine.  Cervical spine nontender.  Cardiovascular: Normal rate, regular rhythm, normal heart sounds and intact distal pulses.  Pulmonary/Chest: Effort normal and breath sounds normal.  Abdominal: Soft. Bowel sounds are normal. She exhibits no distension. There is no  tenderness.  Musculoskeletal: Normal range of motion. She exhibits tenderness. She exhibits no edema.  Patient endorses tenderness to palpation of the low lumbar\sacrum bony elements with pain particularly reproducible to the right along the SI joint and top of the iliac crest posteriorly.  No palpable anomaly.  Positive straight leg raise on the right.  Bilateral dorsalis pedis pulses 2+ symmetric.  No peripheral edema.  Calves are soft and nontender.  Skin condition of lower legs very good.  Neurological: She is alert and  oriented to person, place, and time. She has normal strength. Coordination normal. GCS eye subscore is 4. GCS verbal subscore is 5. GCS motor subscore is 6.  5\5 motor strength for dorsiflexion and extension bilaterally.  Patient can hold and elevate each leg independently off of the bed.  Skin: Skin is warm, dry and intact.  Psychiatric: She has a normal mood and affect.     ED Treatments / Results  Labs (all labs ordered are listed, but only abnormal results are displayed) Labs Reviewed - No data to display  EKG None  Radiology Mr Lumbar Spine Wo Contrast  Result Date: 12/21/2017 CLINICAL DATA:  Lumbar radiculopathy. Worsening pain. Question of disc herniation at L4-5. EXAM: MRI LUMBAR SPINE WITHOUT CONTRAST TECHNIQUE: Multiplanar, multisequence MR imaging of the lumbar spine was performed. No intravenous contrast was administered. COMPARISON:  12/31/2012 FINDINGS: Segmentation:  Standard. Alignment: Mild lumbar dextroscoliosis with apex at L2-3. Trace retrolisthesis with slight kyphosis at L2-3. Vertebrae: Heterogeneous bone marrow signal diffusely without suspicious focal lesion identified. No fracture or significant marrow edema. Chronic multilevel degenerative endplate changes, mildly progressed on the left at L2-3. Conus medullaris and cauda equina: Conus extends to the upper L1 level. Conus and cauda equina appear normal. Paraspinal and other soft tissues: Unchanged  17 mm T2 hyperintense lesion in the interpolar right kidney compatible with a cyst. Disc levels: Disc desiccation throughout the lumbar spine. Progressive, moderate to severe left-sided disc space narrowing at L2-3 with similar appearance of moderate disc space narrowing at L4-5 and L5-S1. T12-L1: Mild facet arthrosis without disc herniation or stenosis. L1-2: Moderate right and mild left facet hypertrophy without disc herniation or stenosis, unchanged. L2-3: Circumferential disc bulging and moderate facet and ligamentum flavum hypertrophy result in mild spinal stenosis, mild right and moderate left lateral recess stenosis, and minimal left neural foraminal narrowing. Disc space narrowing and degenerative endplate changes have mildly progressed although the volume of bulging disc posteriorly has slightly decreased, and the degree of stenosis at this level has not significantly changed. Small facet joint effusions. L3-4: Circumferential disc bulging and moderate facet and ligamentum flavum hypertrophy result in mild spinal stenosis and mild-to-moderate bilateral lateral recess stenosis without significant neural foraminal stenosis, unchanged. Small left facet joint effusion. L4-5: Circumferential disc bulging, a new small right subarticular disc extrusion with slight superior migration, and moderate right and mild left facet and ligamentum flavum hypertrophy result in moderate to severe right and moderate left lateral recess stenosis and moderate right and mild left neural foraminal stenosis. Right lateral recess stenosis has progressed and may affect the right L5 nerve root. Right neural foraminal stenosis is similar to the prior study and may affect the right L4 nerve root. L5-S1: Circumferential disc bulging, a right paracentral disc protrusion, and a right foraminal and extraforaminal disc protrusion result in moderate right and mild left lateral recess stenosis and severe right greater than left neural foraminal  stenosis. Right lateral recess and right neural foraminal stenosis have not significantly changed and may result in L5 and S1 nerve root impingement. Left neural foraminal stenosis has worsened and may result in left L5 nerve root impingement. No significant spinal stenosis. IMPRESSION: 1. New small right subarticular disc extrusion at L4-5 with increased right lateral recess stenosis and potential L5 nerve root impingement. 2. Increased, severe left neural foraminal stenosis an unchanged severe right neural foraminal stenosis at L5-S1. 3. Unchanged right paracentral disc protrusion with right lateral recess stenosis at L5-S1. 4. Mildly progressive disc degeneration at L2-3  with similar mild spinal stenosis and mild right and moderate left lateral recess stenosis. Electronically Signed   By: Logan Bores M.D.   On: 12/21/2017 10:05    Procedures Procedures (including critical care time)  Medications Ordered in ED Medications  oxyCODONE-acetaminophen (PERCOCET/ROXICET) 5-325 MG per tablet 1 tablet (1 tablet Oral Given 12/21/17 0905)  ondansetron (ZOFRAN-ODT) disintegrating tablet 4 mg (4 mg Oral Given 12/21/17 0905)  diazepam (VALIUM) tablet 2 mg (2 mg Oral Given 12/21/17 0905)     Initial Impression / Assessment and Plan / ED Course  I have reviewed the triage vital signs and the nursing notes.  Pertinent labs & imaging results that were available during my care of the patient were reviewed by me and considered in my medical decision making (see chart for details).      Final Clinical Impressions(s) / ED Diagnoses   Final diagnoses:  Herniated nucleus pulposus of lumbosacral region  She is well pain controlled in the emergency department.  After completing study she is up and ambulatory with steady gait.  No weakness.  No incoordination or appearance of increased fall risk.  She is using her walker at baseline with fairly brisk and steady gait.  Patient is stable for discharge.  ED Discharge  Orders        Ordered    oxyCODONE-acetaminophen (PERCOCET) 5-325 MG tablet  Every 6 hours PRN     12/21/17 1039       Charlesetta Shanks, MD 12/29/17 [phone]

## 2017-12-21 NOTE — Discharge Instructions (Addendum)
Call your family doctor on Monday to review your MRI results and discuss referral to a neurosurgeon.

## 2017-12-21 NOTE — ED Notes (Signed)
Patient transported to MRI 

## 2017-12-25 ENCOUNTER — Other Ambulatory Visit (INDEPENDENT_AMBULATORY_CARE_PROVIDER_SITE_OTHER): Payer: Self-pay | Admitting: Specialist

## 2017-12-25 ENCOUNTER — Telehealth (INDEPENDENT_AMBULATORY_CARE_PROVIDER_SITE_OTHER): Payer: Self-pay

## 2017-12-25 DIAGNOSIS — M48062 Spinal stenosis, lumbar region with neurogenic claudication: Secondary | ICD-10-CM

## 2017-12-25 NOTE — Progress Notes (Signed)
ref

## 2017-12-25 NOTE — Telephone Encounter (Signed)
Patient called stating that she is having severe back pain and would like to know if she could get an injection in her back until she can see Dr. Louanne Skye on 01/22/18?  Patient completed MRI on 12/21/17.  CB# is [phone].  Please advise.  Thank you.

## 2017-12-25 NOTE — Telephone Encounter (Signed)
Patient called stating that she is having severe back pain and would like to know if she could get an injection in her back until she can see Dr. Louanne Skye on 01/22/18?  Patient completed MRI on 12/21/17.

## 2017-12-26 ENCOUNTER — Encounter (INDEPENDENT_AMBULATORY_CARE_PROVIDER_SITE_OTHER): Payer: Self-pay | Admitting: Specialist

## 2017-12-26 ENCOUNTER — Ambulatory Visit (INDEPENDENT_AMBULATORY_CARE_PROVIDER_SITE_OTHER): Payer: Medicare Other | Admitting: Specialist

## 2017-12-26 ENCOUNTER — Telehealth (INDEPENDENT_AMBULATORY_CARE_PROVIDER_SITE_OTHER): Payer: Self-pay

## 2017-12-26 ENCOUNTER — Telehealth: Payer: Self-pay

## 2017-12-26 VITALS — BP 134/76 | HR 71 | Ht 65.0 in | Wt 176.0 lb

## 2017-12-26 DIAGNOSIS — M5126 Other intervertebral disc displacement, lumbar region: Secondary | ICD-10-CM | POA: Diagnosis not present

## 2017-12-26 DIAGNOSIS — M48062 Spinal stenosis, lumbar region with neurogenic claudication: Secondary | ICD-10-CM

## 2017-12-26 DIAGNOSIS — R29898 Other symptoms and signs involving the musculoskeletal system: Secondary | ICD-10-CM | POA: Diagnosis not present

## 2017-12-26 MED ORDER — GABAPENTIN 100 MG PO CAPS: ORAL_CAPSULE | ORAL | 0 refills | Status: DC

## 2017-12-26 MED ORDER — METHYLPREDNISOLONE 4 MG PO TBPK: ORAL_TABLET | ORAL | 0 refills | Status: DC

## 2017-12-26 MED ORDER — DIAZEPAM 5 MG PO TABS
2.5000 mg | ORAL_TABLET | Freq: Four times a day (QID) | ORAL | 0 refills | Status: DC | PRN
Start: 2017-12-26 — End: 2018-02-16

## 2017-12-26 NOTE — Telephone Encounter (Signed)
Dr. Louanne Skye states the patient will need to have ESI for 2 pinched nerves and wants to see if she can come off Pradaxa. He saw the 12/5 note where the patient was cleared for another procedure. He requests Dr. Burt Knack contact him to let him know. Cell: [phone]

## 2017-12-26 NOTE — Telephone Encounter (Signed)
[facility] pharmacist with [facility] would like clarification on Rx for Diazepam that was prescribed today by Dr. Louanne Skye.  Stated that patient is already on 2 different strengths of Lorazepam and would like to make sure that Dr. Louanne Skye is aware.  Cb# is [phone].  Please advise.  Thank You.

## 2017-12-26 NOTE — Telephone Encounter (Signed)
Dr Louanne Skye is going to call the pharmacy and discuss this with them

## 2017-12-26 NOTE — Progress Notes (Signed)
Office Visit Note   Patient: Bethany Oconnor           Date of Birth: 02-15-1941           MRN: [id number] Visit Date: 12/26/2017              Requested by: Crist Infante, MD [address] [place], [place] [address] PCP: Crist Infante, MD   Assessment & Plan: Visit Diagnoses:  1. Herniation of lumbar intervertebral disc   2. Spinal stenosis of lumbar region with neurogenic claudication   3. Weakness of right leg     Plan:Avoid bending, stooping and avoid lifting weights greater than 10 lbs. Avoid prolong standing and walking. Avoid frequent bending and stooping  No lifting greater than 10 lbs. May use ice or moist heat for pain. Weight loss is of benefit. Handicap license is approved. Dr. Romona Curls or [place] secretary/Assistant will call to arrange for epidural steroid injection,  You will need to stop the PRADAXA 2 days prior to the epidural steriod shot and then have the injection and restart the pradaxa the day following the injection Right  L4-5 and L5-S1 interlaminar ESIs.  See me 3-4 days post injection .    Follow-Up Instructions: No follow-ups on file.   Orders:  No orders of the defined types were placed in this encounter.  No orders of the defined types were placed in this encounter.     Procedures: No procedures performed   Clinical Data: No additional findings.   Subjective: Chief Complaint  Patient presents with  . Lower Back - Follow-up    MRI Review--Lumbar Spine    77 year old female with a 2 week history of low back pain with radiation into the right leg. The pain worsened and she is not able to get comfortable and she is squirming when sitting in a Car or in chair. She has been trying to do her yoga but even deep breathing causes pain in the back and right buttock feels like an ice pick in the right buttock. She saw Dr. Joylene Draft and he  Referred her to [facility]. She saw Benjiman Core PA-C and he recommended an MRI, but she could  not wait and went to the ER at [facility] on Easter 5 days ago and saw the  MDs and had an MRI there. The results of the study indicate right L4-5 and right L5-S1 HNP with stenosis. The right leg has pulsating in the right groin, right anterior and posterior calf and  Into right dorsal and plantar foot with numbness and paresthesias. She began experiencing neck pain also in the last one week with pain in t the neck with extension of neck and side to side Turning of the neck. 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    Review of Systems  Constitutional: Negative.   HENT: Negative.   Eyes: Negative.   Respiratory: Negative.   Cardiovascular: Negative.   Gastrointestinal: Negative.   Endocrine: Negative.   Genitourinary: Negative.   Musculoskeletal: Negative.   Skin: Negative.   Allergic/Immunologic: Negative.   Neurological: Negative.   Hematological: Negative.   Psychiatric/Behavioral: Negative.      Objective: Vital Signs: BP 134/76 (BP Location: Left Arm, Patient Position: Sitting)   Pulse 71   Ht 5\' 5"  (1.651 m)   Wt 176 lb (79.8 kg)   BMI 29.29 kg/m   Physical Exam  Constitutional: She is oriented to person, place, and time. She appears  well-developed and well-nourished.  HENT:  Head: Normocephalic and atraumatic.  Eyes: Pupils are equal, round, and reactive to light. EOM are normal.  Neck: Normal range of motion. Neck supple.  Pulmonary/Chest: Effort normal and breath sounds normal.  Abdominal: Soft. Bowel sounds are normal.  Neurological: She is alert and oriented to person, place, and time.  Skin: Skin is warm and dry.  Psychiatric: She has a normal mood and affect. Her behavior is normal. Judgment and thought content normal.    Back Exam   Tenderness  The patient is experiencing tenderness in the lumbar.  Range of Motion  Extension: abnormal  Flexion: normal  Lateral bend right: normal  Lateral bend left: normal  Rotation right:  normal  Rotation left: normal   Muscle Strength  Right Quadriceps:  5/5  Left Quadriceps:  5/5  Right Hamstrings:  5/5  Left Hamstrings:  5/5   Tests  Straight leg raise right: negative Straight leg raise left: negative  Reflexes  Patellar: normal Achilles: normal Babinski's sign: normal   Other  Toe walk: normal Heel walk: normal Sensation: normal Gait: normal  Erythema: no back redness Scars: absent      Specialty Comments:  No specialty comments available.  Imaging: No results found.   PMFS History: Patient Active Problem List   Diagnosis Date Noted  . Cerebral aneurysm   . Dementia with behavioral disturbance 12/20/2016  . Aggressive behavior   . CLL (chronic lymphocytic leukemia) ([place]) 09/14/2014  . 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 HYPERTENSION, BENIGN 05/31/2010  . ATRIAL FIBRILLATION 05/31/2010   Past Medical History:  Diagnosis Date  . Anxiety   . Cerebral aneurysm    s/p endovascular colling 2008  . Depression   . Hearing loss   . Macular degeneration   . Neuropathy   . Paroxysmal atrial fibrillation (HCC)   . Pneumonia    08-04-17    Family History  Problem Relation Age of Onset  . Cancer Father 16       died  . Cerebral aneurysm Mother 83       died  . Heart attack Brother     Past Surgical History:  Procedure Laterality Date  . ANEURYSM COILING    . CATARACT EXTRACTION     bilateral  . CERVICAL CONE BIOPSY    . EVALUATION UNDER ANESTHESIA WITH HEMORRHOIDECTOMY N/A 09/03/2017   Procedure: EXAM UNDER ANESTHESIA, EXCISION OF ANAL LESION, POSSIBLE HEMORRHOIDECTOMY;  Surgeon: Ileana Roup, MD;  Location: [facility];  Service: General;  Laterality: N/A;  . THYROIDECTOMY    . TONSILLECTOMY     Social History   Occupational History    Employer: RETIRED  Tobacco Use  . Smoking status: Former Smoker    Last attempt to quit: 09/02/1988    Years since quitting: 29.3  . Smokeless tobacco: Never Used    Substance and Sexual Activity  . Alcohol use: No    Alcohol/week: 0.6 - 1.2 oz    Types: 1 - 2 Glasses of wine per week    Comment: daily for 50 years  No longer drinks 09-01-17  . Drug use: No  . Sexual activity: Not Currently

## 2017-12-26 NOTE — Telephone Encounter (Signed)
This is fine. Ok to hold pradaxa as requested. thanks

## 2017-12-26 NOTE — Patient Instructions (Addendum)
Avoid bending, stooping and avoid lifting weights greater than 10 lbs. Avoid prolong standing and walking. Avoid frequent bending and stooping  No lifting greater than 10 lbs. May use ice or moist heat for pain. Weight loss is of benefit. Handicap license is approved. Dr. Romona Curls or [place] secretary/Assistant will call to arrange for epidural steroid injection,  You will need to stop the PRADAXA 2 days prior to the epidural steriod shot and then have the injection and restart the pradaxa the day following the injection Right  L4-5 and L5-S1 interlaminar ESIs.  See me 3-4 days post injection .   Discussion with Maryann at [facility] and Mrs. Kulinski is on lorazepam  0.5mg  and 1.0mg  tablets at night and tid, so I will discontinue the valium and  Change the gabapentin to 100 mg po every 6 hours for pain. Stop the valium and Use gabapentin for pain. jen 12/26/2017 at 1642.

## 2017-12-29 ENCOUNTER — Ambulatory Visit (INDEPENDENT_AMBULATORY_CARE_PROVIDER_SITE_OTHER): Payer: Self-pay

## 2017-12-29 ENCOUNTER — Telehealth (INDEPENDENT_AMBULATORY_CARE_PROVIDER_SITE_OTHER): Payer: Self-pay | Admitting: *Deleted

## 2017-12-29 ENCOUNTER — Encounter (INDEPENDENT_AMBULATORY_CARE_PROVIDER_SITE_OTHER): Payer: Self-pay | Admitting: Physical Medicine and Rehabilitation

## 2017-12-29 ENCOUNTER — Ambulatory Visit (INDEPENDENT_AMBULATORY_CARE_PROVIDER_SITE_OTHER): Payer: Medicare Other | Admitting: Physical Medicine and Rehabilitation

## 2017-12-29 VITALS — BP 143/75

## 2017-12-29 DIAGNOSIS — M5116 Intervertebral disc disorders with radiculopathy, lumbar region: Secondary | ICD-10-CM

## 2017-12-29 DIAGNOSIS — M5416 Radiculopathy, lumbar region: Secondary | ICD-10-CM

## 2017-12-29 MED ORDER — BETAMETHASONE SOD PHOS & ACET 6 (3-3) MG/ML IJ SUSP
12.0000 mg | Freq: Once | INTRAMUSCULAR | Status: AC
  Administered 2017-12-29: 12 mg

## 2017-12-29 NOTE — Progress Notes (Signed)
GIGI ONSTAD - 77 y.o. female MRN [id number]  Date of birth: December 09, 1940  Office Visit Note: Visit Date: 12/29/2017 PCP: Crist Infante, MD Referred by: Crist Infante, MD  Subjective: Chief Complaint  Patient presents with  . Lower Back - Pain  . Right Leg - Pain  . Left Leg - Pain   HPI: Mrs. Boatman is a 77 year old female that was put on our schedule on Friday by Dr. Louanne Skye for epidural injection for right-sided radicular leg pain and low back pain.  She had a chronic history of low back pain off and on but approximately 1 month of severe right hip and leg pain.  She uses heat and ice and that seems to make it a little bit better but nothing else is really helped.  She has used some pain medications.  She has MRI findings of new compared to 2014 L4-5 right paracentral disc protrusion and foraminal stenosis which is severe.  Reviewing images shows that there is clearly space for injection.  She also has chronic appearing L5-S1 disc herniation and extrusion which is paracentral and could affect the L5 and the foramen and S1 in the lateral recess.  Again this is a finding that was present on 2014 MRI.  Her case is complicated by history of depression and the fact that she lost her husband last year as well as history of some dementia.  She is also legally blind.  She is presents today with a caregiver.  She does take Pradaxa do to paroxysmal atrial fibrillation.  This is managed by Dr. Burt Knack.  She was taken off Pradaxa for a recent procedure which was for examination under anesthesia and possible external hemorrhoidectomy.  She was taken off the Pradaxa for 2 days.  The standard of care for an intralaminar epidural injection given with the latest information that we have is 5 days.  Interlaminar epidural injection is considered intermediate risk for epidural hematoma.  Given the fact that she has been off the Pradaxa at  Dr. Otho Ket request for 2 days I am going to complete a right L4 and L5  transforaminal epidural steroid injection from a risk/ benefits standpoint.  Also, at least from an anatomy standpoint I am not sure of the usefulness of adjacent level interlaminar epidural injections.  Typically the interlaminar approach is able to provide medication spread to the adjacent, at least, 2 levels.  Depending on relief with a transforaminal approach could look at interlaminar approach off Pradaxa for 5 days if her cardiologist gives clearance.  We are actually starting to do most transforaminal epidural injections without removal of the anticoagulation.  This would be for injection such as facet joint blocks and transforaminal injections another sympathetic blocks. See Archie Balboa in "Pain Medicine" October 2016.   ROS Otherwise per HPI.  Assessment & Plan: Visit Diagnoses:  1. Lumbar radiculopathy   2. Radiculopathy due to lumbar intervertebral disc disorder     Plan: No additional findings.   Meds & Orders:  Meds ordered this encounter  Medications  . betamethasone acetate-betamethasone sodium phosphate (CELESTONE) injection 12 mg    Orders Placed This Encounter  Procedures  . XR C-ARM NO REPORT  . Epidural Steroid injection    Follow-up: Return for Dr. Louanne Skye.   Procedures: No procedures performed  Lumbosacral Transforaminal Epidural Steroid Injection - Sub-Pedicular Approach with Fluoroscopic Guidance  Patient: JERMYA DOWDING      Date of Birth: 08-05-1941 MRN: [id number] PCP: Crist Infante, MD  Visit Date: 12/29/2017   Universal Protocol:    Date/Time: 12/29/2017  Consent Given By: the patient  Position: PRONE  Additional Comments: Vital signs were monitored before and after the procedure. Patient was prepped and draped in the usual sterile fashion. The correct patient, procedure, and site was verified.   Injection Procedure Details:  Procedure Site One Meds Administered:  Meds ordered this encounter  Medications  . betamethasone  acetate-betamethasone sodium phosphate (CELESTONE) injection 12 mg    Laterality: Right  Location/Site:  L4-L5 L5-S1  Needle size: 22 G  Needle type: Spinal  Needle Placement: Transforaminal  Findings:    -Comments: Excellent flow of contrast along the nerve and into the epidural space.  Procedure Details: After squaring off the end-plates to get a true AP view, the C-arm was positioned so that an oblique view of the foramen as noted above was visualized. The target area is just inferior to the "nose of the scotty dog" or sub pedicular. The soft tissues overlying this structure were infiltrated with 2-3 ml. of 1% Lidocaine without Epinephrine.  The spinal needle was inserted toward the target using a "trajectory" view along the fluoroscope beam.  Under AP and lateral visualization, the needle was advanced so it did not puncture dura and was located close the 6 O'Clock position of the pedical in AP tracterory. Biplanar projections were used to confirm position. Aspiration was confirmed to be negative for CSF and/or blood. A 1-2 ml. volume of Isovue-250 was injected and flow of contrast was noted at each level. Radiographs were obtained for documentation purposes.   After attaining the desired flow of contrast documented above, a 0.5 to 1.0 ml test dose of 0.25% Marcaine was injected into each respective transforaminal space.  The patient was observed for 90 seconds post injection.  After no sensory deficits were reported, and normal lower extremity motor function was noted,   the above injectate was administered so that equal amounts of the injectate were placed at each foramen (level) into the transforaminal epidural space.   Additional Comments:  No complications occurred Patient was very sensitive to any needle in the muscle detailed myofascial pain syndrome she injections were completed with very good flow of contrast. Dressing: Band-Aid    Post-procedure details: Patient was  observed during the procedure. Post-procedure instructions were reviewed.  Patient left the clinic in stable condition.    Clinical History: MRI LUMBAR SPINE WITHOUT CONTRAST  TECHNIQUE: Multiplanar, multisequence MR imaging of the lumbar spine was performed. No intravenous contrast was administered.  COMPARISON:  12/31/2012  FINDINGS: Segmentation:  Standard.  Alignment: Mild lumbar dextroscoliosis with apex at L2-3. Trace retrolisthesis with slight kyphosis at L2-3.  Vertebrae: Heterogeneous bone marrow signal diffusely without suspicious focal lesion identified. No fracture or significant marrow edema. Chronic multilevel degenerative endplate changes, mildly progressed on the left at L2-3.  Conus medullaris and cauda equina: Conus extends to the upper L1 level. Conus and cauda equina appear normal.  Paraspinal and other soft tissues: Unchanged 17 mm T2 hyperintense lesion in the interpolar right kidney compatible with a cyst.  Disc levels:  Disc desiccation throughout the lumbar spine. Progressive, moderate to severe left-sided disc space narrowing at L2-3 with similar appearance of moderate disc space narrowing at L4-5 and L5-S1.  T12-L1: Mild facet arthrosis without disc herniation or stenosis.  L1-2: Moderate right and mild left facet hypertrophy without disc herniation or stenosis, unchanged.  L2-3: Circumferential disc bulging and moderate facet and ligamentum flavum hypertrophy result in  mild spinal stenosis, mild right and moderate left lateral recess stenosis, and minimal left neural foraminal narrowing. Disc space narrowing and degenerative endplate changes have mildly progressed although the volume of bulging disc posteriorly has slightly decreased, and the degree of stenosis at this level has not significantly changed. Small facet joint effusions.  L3-4: Circumferential disc bulging and moderate facet and ligamentum flavum hypertrophy  result in mild spinal stenosis and mild-to-moderate bilateral lateral recess stenosis without significant neural foraminal stenosis, unchanged. Small left facet joint effusion.  L4-5: Circumferential disc bulging, a new small right subarticular disc extrusion with slight superior migration, and moderate right and mild left facet and ligamentum flavum hypertrophy result in moderate to severe right and moderate left lateral recess stenosis and moderate right and mild left neural foraminal stenosis. Right lateral recess stenosis has progressed and may affect the right L5 nerve root. Right neural foraminal stenosis is similar to the prior study and may affect the right L4 nerve root.  L5-S1: Circumferential disc bulging, a right paracentral disc protrusion, and a right foraminal and extraforaminal disc protrusion result in moderate right and mild left lateral recess stenosis and severe right greater than left neural foraminal stenosis. Right lateral recess and right neural foraminal stenosis have not significantly changed and may result in L5 and S1 nerve root impingement. Left neural foraminal stenosis has worsened and may result in left L5 nerve root impingement. No significant spinal stenosis.  IMPRESSION: 1. New small right subarticular disc extrusion at L4-5 with increased right lateral recess stenosis and potential L5 nerve root impingement. 2. Increased, severe left neural foraminal stenosis an unchanged severe right neural foraminal stenosis at L5-S1. 3. Unchanged right paracentral disc protrusion with right lateral recess stenosis at L5-S1. 4. Mildly progressive disc degeneration at L2-3 with similar mild spinal stenosis and mild right and moderate left lateral recess stenosis.   Electronically Signed   By: Logan Bores M.D.   On: 12/21/2017 10:05   She reports that she quit smoking about 29 years ago. She has never used smokeless tobacco.  Recent Labs     09/01/17 0908  HGBA1C 5.7*    Objective:  VS:  HT:    WT:   BMI:     BP:(!) 143/75  HR: bpm  TEMP: ( )  RESP:  Physical Exam  Musculoskeletal:  Patient ambulates slowly with a walker.  She is slow to rise from a seated position.  She has good distal strength.    Ortho Exam Imaging: Xr C-arm No Report  Result Date: 12/29/2017 Please see Notes or Procedures tab for imaging impression.   Past Medical/Family/Surgical/Social History: Medications & Allergies reviewed per EMR, new medications updated. Patient Active Problem List   Diagnosis Date Noted  . Cerebral aneurysm   . Dementia with behavioral disturbance 12/20/2016  . Aggressive behavior   . CLL (chronic lymphocytic leukemia) ([place]) 09/14/2014  . 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 HYPERTENSION, BENIGN 05/31/2010  . ATRIAL FIBRILLATION 05/31/2010   Past Medical History:  Diagnosis Date  . Anxiety   . Cerebral aneurysm    s/p endovascular colling 2008  . Depression   . Hearing loss   . Macular degeneration   . Neuropathy   . Paroxysmal atrial fibrillation (HCC)   . Pneumonia    08-04-17   Family History  Problem Relation Age of Onset  . Cancer Father 50       died  . Cerebral aneurysm Mother 17  died  . Heart attack Brother    Past Surgical History:  Procedure Laterality Date  . ANEURYSM COILING    . CATARACT EXTRACTION     bilateral  . CERVICAL CONE BIOPSY    . EVALUATION UNDER ANESTHESIA WITH HEMORRHOIDECTOMY N/A 09/03/2017   Procedure: EXAM UNDER ANESTHESIA, EXCISION OF ANAL LESION, POSSIBLE HEMORRHOIDECTOMY;  Surgeon: Ileana Roup, MD;  Location: [facility];  Service: General;  Laterality: N/A;  . THYROIDECTOMY    . TONSILLECTOMY     Social History   Occupational History    Employer: RETIRED  Tobacco Use  . Smoking status: Former Smoker    Last attempt to quit: 09/02/1988    Years since quitting: 29.3  . Smokeless tobacco: Never Used  Substance and Sexual Activity   . Alcohol use: No    Alcohol/week: 0.6 - 1.2 oz    Types: 1 - 2 Glasses of wine per week    Comment: daily for 50 years  No longer drinks 09-01-17  . Drug use: No  . Sexual activity: Not Currently

## 2017-12-29 NOTE — Telephone Encounter (Signed)
This will be up to her primary care physician or cardiologist. I was her colorectal surgeon.  Bethany Oconnor

## 2017-12-29 NOTE — Telephone Encounter (Signed)
This is OK to hold pradaxa for the procedure as requested. thanks

## 2017-12-29 NOTE — Patient Instructions (Signed)

## 2017-12-29 NOTE — Procedures (Signed)
Lumbosacral Transforaminal Epidural Steroid Injection - Sub-Pedicular Approach with Fluoroscopic Guidance  Patient: Bethany Oconnor      Date of Birth: [birth year] MRN: [id number] PCP: Crist Infante, MD      Visit Date: 12/29/2017   Universal Protocol:    Date/Time: 12/29/2017  Consent Given By: the patient  Position: PRONE  Additional Comments: Vital signs were monitored before and after the procedure. Patient was prepped and draped in the usual sterile fashion. The correct patient, procedure, and site was verified.   Injection Procedure Details:  Procedure Site One Meds Administered:  Meds ordered this encounter  Medications  . betamethasone acetate-betamethasone sodium phosphate (CELESTONE) injection 12 mg    Laterality: Right  Location/Site:  L4-L5 L5-S1  Needle size: 22 G  Needle type: Spinal  Needle Placement: Transforaminal  Findings:    -Comments: Excellent flow of contrast along the nerve and into the epidural space.  Procedure Details: After squaring off the end-plates to get a true AP view, the C-arm was positioned so that an oblique view of the foramen as noted above was visualized. The target area is just inferior to the "nose of the scotty dog" or sub pedicular. The soft tissues overlying this structure were infiltrated with 2-3 ml. of 1% Lidocaine without Epinephrine.  The spinal needle was inserted toward the target using a "trajectory" view along the fluoroscope beam.  Under AP and lateral visualization, the needle was advanced so it did not puncture dura and was located close the 6 O'Clock position of the pedical in AP tracterory. Biplanar projections were used to confirm position. Aspiration was confirmed to be negative for CSF and/or blood. A 1-2 ml. volume of Isovue-250 was injected and flow of contrast was noted at each level. Radiographs were obtained for documentation purposes.   After attaining the desired flow of contrast documented above, a  0.5 to 1.0 ml test dose of 0.25% Marcaine was injected into each respective transforaminal space.  The patient was observed for 90 seconds post injection.  After no sensory deficits were reported, and normal lower extremity motor function was noted,   the above injectate was administered so that equal amounts of the injectate were placed at each foramen (level) into the transforaminal epidural space.   Additional Comments:  No complications occurred Patient was very sensitive to any needle in the muscle detailed myofascial pain syndrome she injections were completed with very good flow of contrast. Dressing: Band-Aid    Post-procedure details: Patient was observed during the procedure. Post-procedure instructions were reviewed.  Patient left the clinic in stable condition.

## 2017-12-29 NOTE — Progress Notes (Signed)
.  Numeric Pain Rating Scale and Functional Assessment Average Pain 9   In the last MONTH (on 0-10 scale) has pain interfered with the following?  1. General activity like being  able to carry out your everyday physical activities such as walking, climbing stairs, carrying groceries, or moving a chair?  Rating(5)   +Driver, -BT(stopped pradaxa 12/27/17), -Dye Allergies.

## 2017-12-30 ENCOUNTER — Telehealth (INDEPENDENT_AMBULATORY_CARE_PROVIDER_SITE_OTHER): Payer: Self-pay | Admitting: Specialist

## 2017-12-30 ENCOUNTER — Telehealth (INDEPENDENT_AMBULATORY_CARE_PROVIDER_SITE_OTHER): Payer: Self-pay | Admitting: Radiology

## 2017-12-30 NOTE — Telephone Encounter (Signed)
[place] with [facility] called left voicemail message stating she faxed over information for the patient. Ulis Rias asked for the patient. The number to contact patient. The number to contact Ulis Rias is [phone]

## 2017-12-30 NOTE — Telephone Encounter (Signed)
Zigmund Daniel called and lmom regarding a rx that the patient was given and she wanted a call back.  However I tried to call her back and had to leave a message for her to call me back.  So I am waiting on a call back from her.

## 2017-12-30 NOTE — Telephone Encounter (Signed)
[place] with [facility] called left voicemail message stating she faxed over information for the patient. Ulis Rias asked for the patient. The number to contact patient. The number to contact Ulis Rias is [phone]

## 2017-12-30 NOTE — Telephone Encounter (Signed)
   Reviewed according to office protocol.  77 y.o. female with paroxysmal AFib on Pradaxa.  No hx of stroke.  CHADS2-VASc=4 (HTN, Female, agex2).  Last Scr 0.83.  CrCl = 70.  Reviewed with pharmacy.  Holding Pradaxa for 3 days is adequate for epidural steroid injection.  But, may hold 5 days as directed by Dr. Burt Knack if the surgeon prefers to hold for this long.  Please call with questions.   Richardson Dopp, PA-C    12/30/2017 2:43 PM

## 2017-12-30 NOTE — Telephone Encounter (Signed)
-----   Message from Ailene Rud, [place] sent at 12/29/2017 2:35 PM EDT -----  Pt is needs to schedule an epidural steroid injection with Dr. Ernestina Patches at [facility]. Pt is taking Pradaxa and needs to hold 5 days prior to epidural steroid injection. Ok to hold? Please Advise.

## 2017-12-31 NOTE — Telephone Encounter (Signed)
I have not rec'd anything, patient has appt with Dr. Louanne Skye on 01/01/18

## 2018-01-01 ENCOUNTER — Telehealth (INDEPENDENT_AMBULATORY_CARE_PROVIDER_SITE_OTHER): Payer: Self-pay | Admitting: Specialist

## 2018-01-01 ENCOUNTER — Ambulatory Visit (INDEPENDENT_AMBULATORY_CARE_PROVIDER_SITE_OTHER): Payer: Medicare Other | Admitting: Specialist

## 2018-01-01 ENCOUNTER — Other Ambulatory Visit: Payer: Self-pay

## 2018-01-01 ENCOUNTER — Encounter (INDEPENDENT_AMBULATORY_CARE_PROVIDER_SITE_OTHER): Payer: Self-pay | Admitting: Specialist

## 2018-01-01 VITALS — BP 121/74 | HR 67 | Ht 65.0 in | Wt 170.0 lb

## 2018-01-01 DIAGNOSIS — M48062 Spinal stenosis, lumbar region with neurogenic claudication: Secondary | ICD-10-CM | POA: Diagnosis not present

## 2018-01-01 DIAGNOSIS — M5126 Other intervertebral disc displacement, lumbar region: Secondary | ICD-10-CM

## 2018-01-01 DIAGNOSIS — M4726 Other spondylosis with radiculopathy, lumbar region: Secondary | ICD-10-CM | POA: Diagnosis not present

## 2018-01-01 MED ORDER — GABAPENTIN 100 MG PO CAPS: 100.0000 mg | ORAL_CAPSULE | Freq: Two times a day (BID) | ORAL | 0 refills | Status: DC

## 2018-01-01 MED ORDER — OXYCODONE HCL ER 10 MG PO T12A: 10.0000 mg | EXTENDED_RELEASE_TABLET | Freq: Two times a day (BID) | ORAL | 0 refills | Status: DC

## 2018-01-01 MED ORDER — OXYCODONE HCL ER 10 MG PO T12A: 10.0000 mg | EXTENDED_RELEASE_TABLET | Freq: Two times a day (BID) | ORAL | Status: DC

## 2018-01-01 NOTE — Telephone Encounter (Signed)
Pharmacy needs to verify directions on rx written. It says 1 capsule twice a day for 3 days but the quantity is 60. Please call and verify its supposed to be for 30 days. Pharmacy # [id number]

## 2018-01-01 NOTE — Addendum Note (Signed)
Addended by: Basil Dess on: 01/01/2018 01:02 PM   Modules accepted: Orders

## 2018-01-01 NOTE — Patient Instructions (Addendum)
Avoid bending, stooping and avoid lifting weights greater than 10 lbs. Avoid prolong standing and walking. Avoid frequent bending and stooping  No lifting greater than 10 lbs. May use ice or moist heat for pain. Weight loss is of benefit. The Epidural injecition may take up to 2 weeks to work. Will try TENS unit and have Home Health see you to place the TENS unit  Pads using the TENS unit you already have.  Continue with gabapentin  Decrease to 100 mg po BID.  Do not take these together but 6 hours a part. Will try oxycontin 10 mg po every 12 hours.

## 2018-01-01 NOTE — Progress Notes (Signed)
Office Visit Note   Patient: Bethany Oconnor           Date of Birth: [birth year]           MRN: [id number] Visit Date: 01/01/2018              Requested by: Crist Infante, MD [address] [place], [place] [address] PCP: Crist Infante, MD   Assessment & Plan: Visit Diagnoses:  1. Spinal stenosis of lumbar region with neurogenic claudication   2. Other spondylosis with radiculopathy, lumbar region   3. Herniation of lumbar intervertebral disc     Plan: Avoid bending, stooping and avoid lifting weights greater than 10 lbs. Avoid prolong standing and walking. Avoid frequent bending and stooping  No lifting greater than 10 lbs. May use ice or moist heat for pain. Weight loss is of benefit. The Epidural injecition may take up to 2 weeks to work. Will try TENS unit and have Home Health see you to place the TENS unit  Pads using the TENS unit you already have.  Continue with gabapentin  Decrease to 100 mg po BID.  Do not take these together but 6 hours a part. Will try oxycontin 10 mg po every 12 hours.   Follow-Up Instructions: Return in about 1 week (around 01/08/2018).   Orders:  Orders Placed This Encounter  Procedures  . Ambulatory referral to [place] ordered this encounter  Medications  . oxyCODONE (OXYCONTIN) 12 hr tablet 10 mg  . gabapentin (NEURONTIN) 100 MG capsule    Sig: Take 1 capsule (100 mg total) by mouth 2 (two) times daily for 3 days.    Dispense:  60 capsule    Refill:  0      Procedures: No procedures performed   Clinical Data: No additional findings.   Subjective: Chief Complaint  Patient presents with  . Lower Back - Pain, Follow-up    S/p ESI (Dr. Ernestina Patches) 12/29/17    77 year old walking occasionally to dinning room and to the car but is still having pain following the ESI done Monday at 3 PM . She has restarted her pradaxa. She took gabapentin On Saturday and did have some feelings of swimmy headed with the use of the  gabapentin. She is hard of hearing and has macular degeneration. She has a TENS unit and she Was seeing a DC and was using it for a different pain. This pain is different. No bowel or bladder difficulties. Some pain with cough and sneeze.    Review of Systems  Constitutional: Negative.   HENT: Negative.   Eyes: Negative.   Respiratory: Negative.   Cardiovascular: Negative.   Gastrointestinal: Negative.   Endocrine: Negative.   Genitourinary: Negative.   Musculoskeletal: Negative.   Skin: Negative.   Allergic/Immunologic: Negative.   Neurological: Negative.   Hematological: Negative.   Psychiatric/Behavioral: Negative.      Objective: Vital Signs: BP 121/74 (BP Location: Left Arm, Patient Position: Sitting, Cuff Size: Normal)   Pulse 67   Ht 5\' 5"  (1.651 m)   Wt 170 lb (77.1 kg)   BMI 28.29 kg/m   Physical Exam  Back Exam   Range of Motion  Extension: abnormal  Flexion: abnormal  Lateral bend right: abnormal  Rotation right: abnormal  Rotation left: abnormal   Muscle Strength  Right Quadriceps:  4/5  Left Quadriceps:  5/5  Right Hamstrings:  5/5  Left Hamstrings:  5/5   Tests  Straight leg raise  right: negative Straight leg raise left: negative  Reflexes  Patellar: normal Achilles: normal Babinski's sign: normal   Other  Toe walk: normal Heel walk: normal Sensation: normal Gait: normal  Erythema: no back redness Scars: present  Comments:  Tender right buttock and sciatic notch.       Specialty Comments:  No specialty comments available.  Imaging: No results found.   PMFS History: Patient Active Problem List   Diagnosis Date Noted  . Cerebral aneurysm   . Dementia with behavioral disturbance 12/20/2016  . Aggressive behavior   . CLL (chronic lymphocytic leukemia) ([place]) 09/14/2014  . 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 HYPERTENSION, BENIGN 05/31/2010  . ATRIAL FIBRILLATION 05/31/2010   Past Medical History:    Diagnosis Date  . Anxiety   . Cerebral aneurysm    s/p endovascular colling 2008  . Depression   . Hearing loss   . Macular degeneration   . Neuropathy   . Paroxysmal atrial fibrillation (HCC)   . Pneumonia    08-04-17    Family History  Problem Relation Age of Onset  . Cancer Father 58       died  . Cerebral aneurysm Mother 57       died  . Heart attack Brother     Past Surgical History:  Procedure Laterality Date  . ANEURYSM COILING    . CATARACT EXTRACTION     bilateral  . CERVICAL CONE BIOPSY    . EVALUATION UNDER ANESTHESIA WITH HEMORRHOIDECTOMY N/A 09/03/2017   Procedure: EXAM UNDER ANESTHESIA, EXCISION OF ANAL LESION, POSSIBLE HEMORRHOIDECTOMY;  Surgeon: Ileana Roup, MD;  Location: [facility];  Service: General;  Laterality: N/A;  . THYROIDECTOMY    . TONSILLECTOMY     Social History   Occupational History    Employer: RETIRED  Tobacco Use  . Smoking status: Former Smoker    Last attempt to quit: 09/02/1988    Years since quitting: 29.3  . Smokeless tobacco: Never Used  Substance and Sexual Activity  . Alcohol use: No    Alcohol/week: 0.6 - 1.2 oz    Types: 1 - 2 Glasses of wine per week    Comment: daily for 50 years  No longer drinks 09-01-17  . Drug use: No  . Sexual activity: Not Currently

## 2018-01-02 NOTE — Telephone Encounter (Signed)
Please advise 

## 2018-01-02 NOTE — Telephone Encounter (Signed)
I called and advised pharmacist the directions should say 30 days, as opposed to 3 days.

## 2018-01-05 ENCOUNTER — Telehealth (INDEPENDENT_AMBULATORY_CARE_PROVIDER_SITE_OTHER): Payer: Self-pay | Admitting: Specialist

## 2018-01-05 NOTE — Telephone Encounter (Signed)
Patient called requesting to speak with you has some questions she would like to ask you. CB # [phone]

## 2018-01-05 NOTE — Telephone Encounter (Signed)
Patient wants to know what she should and should not do when doing yoga?

## 2018-01-06 ENCOUNTER — Telehealth (INDEPENDENT_AMBULATORY_CARE_PROVIDER_SITE_OTHER): Payer: Self-pay | Admitting: Specialist

## 2018-01-06 NOTE — Telephone Encounter (Signed)
Patient has had trouble taking the medication she has been given. She stopped taking oxycodone because the two times she did take it, she had extreme vomiting and diarrhea. She also was unable to take the tramadol and she would like it put in her chart that she is allergic to codeine if its not already documented.  She needs to know as soon as possible if she can stop taking the gabapentin as well because her balance and dizziness have gotten much worse since shes been taking it. She started taking 4 x a day but reduced it to 2 x a day and it didn't help much. She said she will take tylenol for pain. Please advise on if she can stop gabapentin altogether, thanks

## 2018-01-06 NOTE — Telephone Encounter (Signed)
Patient has had trouble taking the medication she has been given. She stopped taking oxycodone because the two times she did take it, she had extreme vomiting and diarrhea. She also was unable to take the tramadol and she would like it put in her chart that she is allergic to codeine if its not already documented.  She needs to know as soon as possible if she can stop taking the gabapentin as well because her balance and dizziness have gotten much worse since shes been taking it. She started taking 4 x a day but reduced it to 2 x a day and it didn't help much. She said she will take tylenol for pain. Please advise on if she can stop gabapentin altogether, thanks  CB #  [phone]

## 2018-01-09 ENCOUNTER — Ambulatory Visit (INDEPENDENT_AMBULATORY_CARE_PROVIDER_SITE_OTHER): Payer: Medicare Other | Admitting: Specialist

## 2018-01-09 ENCOUNTER — Encounter (INDEPENDENT_AMBULATORY_CARE_PROVIDER_SITE_OTHER): Payer: Self-pay | Admitting: Specialist

## 2018-01-09 VITALS — BP 140/78 | HR 78 | Ht 65.0 in | Wt 170.0 lb

## 2018-01-09 DIAGNOSIS — M5416 Radiculopathy, lumbar region: Secondary | ICD-10-CM | POA: Diagnosis not present

## 2018-01-09 DIAGNOSIS — M48062 Spinal stenosis, lumbar region with neurogenic claudication: Secondary | ICD-10-CM | POA: Diagnosis not present

## 2018-01-09 DIAGNOSIS — M4722 Other spondylosis with radiculopathy, cervical region: Secondary | ICD-10-CM

## 2018-01-09 MED ORDER — TAPENTADOL HCL 50 MG PO TABS: 50.0000 mg | ORAL_TABLET | Freq: Four times a day (QID) | ORAL | 0 refills | Status: AC | PRN

## 2018-01-09 NOTE — Patient Instructions (Signed)
Avoid bending, stooping and avoid lifting weights greater than 10 lbs. Avoid prolong standing and walking. Avoid frequent bending and stooping  No lifting greater than 10 lbs. May use ice or moist heat for pain. Weight loss is of benefit. Handicap license is approved. Dr. Romona Curls secretary/Assistant will call to arrange for epidural steroid injection right L4-5 and L5-S1 interlaminar. Try Nucynta 50 mg every 6 hours for pain, we have tried tramadol and hydrocodone and these are not helping.  Stop the Pradaxa today and we will schedule ESI to be done in 5 days, as the injections will be in a different area then previously you need to be off the blood thinner longer.

## 2018-01-09 NOTE — Progress Notes (Signed)
Office Visit Note   Patient: Bethany Oconnor           Date of Birth: 01-02-1941           MRN: [id number] Visit Date: 01/09/2018              Requested by: Crist Infante, MD [address] [place], [place] [address] PCP: Crist Infante, MD   Assessment & Plan: Visit Diagnoses:  1. Subacute right lumbar radiculopathy   2. Spinal stenosis of lumbar region with neurogenic claudication   3. Other spondylosis with radiculopathy, cervical region     Plan:Avoid bending, stooping and avoid lifting weights greater than 10 lbs. Avoid prolong standing and walking. Avoid frequent bending and stooping  No lifting greater than 10 lbs. May use ice or moist heat for pain. Weight loss is of benefit. Handicap license is approved. Dr. Romona Curls secretary/Assistant will call to arrange for epidural steroid injection right L4-5 and L5-S1 interlaminar. Try Nucynta 50 mg every 6 hours for pain, we have tried tramadol and hydrocodone and these are not helping.  Stop the Pradaxa today and we will schedule ESI to be done in 5 days, as the injections will be in a different area then previously you need to be off the blood thinner longer.   Follow-Up Instructions: No follow-ups on file.   Orders:  Orders Placed This Encounter  Procedures  . Ambulatory referral to Physical Medicine Rehab   Meds ordered this encounter  Medications  . tapentadol (NUCYNTA) 50 MG tablet    Sig: Take 1 tablet (50 mg total) by mouth every 6 (six) hours as needed for up to 7 days.    Dispense:  30 tablet    Refill:  0      Procedures: No procedures performed   Clinical Data: No additional findings.   Subjective: Chief Complaint  Patient presents with  . Lower Back - Follow-up    77 year old female with back pain and radiation into the right leg. The pain is into the right thigh and leg pain both in the L4, L5 and S1 distribution. She  Has pain that is worse with standing and walking and is also present with  sitting. No bowel or bladder pain. The ESI done on 4/29 did not help that much.    Review of Systems  Constitutional: Negative.   HENT: Negative.   Eyes: Negative.   Respiratory: Negative.   Cardiovascular: Negative.   Gastrointestinal: Negative.   Endocrine: Negative.   Genitourinary: Negative.   Musculoskeletal: Negative.   Skin: Negative.   Allergic/Immunologic: Negative.   Neurological: Negative.   Hematological: Negative.   Psychiatric/Behavioral: Negative.      Objective: Vital Signs: BP 140/78 (BP Location: Left Arm, Patient Position: Sitting)   Pulse 78   Ht 5\' 5"  (1.651 m)   Wt 170 lb (77.1 kg)   BMI 28.29 kg/m   Physical Exam  Constitutional: She is oriented to person, place, and time. She appears well-developed and well-nourished.  HENT:  Head: Normocephalic and atraumatic.  Eyes: Pupils are equal, round, and reactive to light. EOM are normal.  Neck: Normal range of motion. Neck supple.  Pulmonary/Chest: Effort normal and breath sounds normal.  Abdominal: Soft. Bowel sounds are normal.  Neurological: She is alert and oriented to person, place, and time.  Skin: Skin is warm and dry.  Psychiatric: She has a normal mood and affect. Her behavior is normal. Judgment and thought content normal.    Back  Exam   Tenderness  The patient is experiencing tenderness in the lumbar and cervical.  Range of Motion  Extension: abnormal  Flexion: abnormal  Lateral bend right: abnormal  Lateral bend left: abnormal  Rotation right: abnormal  Rotation left: abnormal   Muscle Strength  Right Quadriceps:  5/5  Left Quadriceps:  5/5  Right Hamstrings:  5/5  Left Hamstrings:  5/5   Tests  Straight leg raise right: negative Straight leg raise left: negative  Reflexes  Patellar: normal Achilles: normal Babinski's sign: normal   Other  Toe walk: normal Heel walk: normal Sensation: normal Gait: normal  Erythema: no back redness Scars:  absent      Specialty Comments:  No specialty comments available.  Imaging: No results found.   PMFS History: Patient Active Problem List   Diagnosis Date Noted  . Cerebral aneurysm   . Dementia with behavioral disturbance 12/20/2016  . Aggressive behavior   . CLL (chronic lymphocytic leukemia) ([place]) 09/14/2014  . 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 HYPERTENSION, BENIGN 05/31/2010  . ATRIAL FIBRILLATION 05/31/2010   Past Medical History:  Diagnosis Date  . Anxiety   . Cerebral aneurysm    s/p endovascular colling 2008  . Depression   . Hearing loss   . Macular degeneration   . Neuropathy   . Paroxysmal atrial fibrillation (H[facility])   . Pneumonia    08-04-17    Family History  Problem Relation Age of Onset  . Cancer Father 76       died  . Cerebral aneurysm Mother 70       died  . Heart attack Brother     Past Surgical History:  Procedure Laterality Date  . ANEURYSM COILING    . CATARACT EXTRACTION     bilateral  . CERVICAL CONE BIOPSY    . EVALUATION UNDER ANESTHESIA WITH HEMORRHOIDECTOMY N/A 09/03/2017   Procedure: EXAM UNDER ANESTHESIA, EXCISION OF ANAL LESION, POSSIBLE HEMORRHOIDECTOMY;  Surgeon: Ileana Roup, MD;  Location: [facility];  Service: General;  Laterality: N/A;  . THYROIDECTOMY    . TONSILLECTOMY     Social History   Occupational History    Employer: RETIRED  Tobacco Use  . Smoking status: Former Smoker    Last attempt to quit: 09/02/1988    Years since quitting: 29.3  . Smokeless tobacco: Never Used  Substance and Sexual Activity  . Alcohol use: No    Alcohol/week: 0.6 - 1.2 oz    Types: 1 - 2 Glasses of wine per week    Comment: daily for 50 years  No longer drinks 09-01-17  . Drug use: No  . Sexual activity: Not Currently

## 2018-01-12 ENCOUNTER — Telehealth (INDEPENDENT_AMBULATORY_CARE_PROVIDER_SITE_OTHER): Payer: Self-pay | Admitting: Specialist

## 2018-01-12 ENCOUNTER — Encounter (INDEPENDENT_AMBULATORY_CARE_PROVIDER_SITE_OTHER): Payer: Self-pay

## 2018-01-12 ENCOUNTER — Encounter: Payer: Self-pay | Admitting: Cardiovascular Disease

## 2018-01-12 ENCOUNTER — Ambulatory Visit: Payer: Medicare Other | Admitting: Cardiovascular Disease

## 2018-01-12 VITALS — BP 128/86 | HR 81 | Ht 65.0 in | Wt 170.4 lb

## 2018-01-12 DIAGNOSIS — I480 Paroxysmal atrial fibrillation: Secondary | ICD-10-CM

## 2018-01-12 NOTE — Patient Instructions (Signed)

## 2018-01-12 NOTE — Progress Notes (Signed)
Cardiology Office Note Date:  01/12/2018   ID:  Bethany Oconnor, DOB 1941-04-14, MRN [id number]  PCP:  Bethany Infante, MD  Cardiologist:  Bethany Mocha, MD    Chief Complaint  Patient presents with  . Follow-up    paroxysmal atrial fib     History of Present Illness: Bethany Oconnor is a 77 y.o. female who presents for follow-up evaluation.   [name] is followed for paroxysmal atrial fibrillation, HTN, and an ASD. She is having lots of trouble with non-cardiac issues. Her macular degeneration has progressed and her vision has become increasingly poor. She is having more problems with low back pain and also having difficulty tolerating narcotic analgesics.   From a cardiac perspective she seems to be doing ok. Today, she denies symptoms of palpitations, chest pain, shortness of breath, orthopnea, PND, lower extremity edema, or syncope. She does experience dizziness/unsteadiness.   Past Medical History:  Diagnosis Date  . Anxiety   . Cerebral aneurysm    s/p endovascular colling 2008  . Depression   . Hearing loss   . Macular degeneration   . Neuropathy   . Paroxysmal atrial fibrillation (HCC)   . Pneumonia    08-04-17    Past Surgical History:  Procedure Laterality Date  . ANEURYSM COILING    . CATARACT EXTRACTION     bilateral  . CERVICAL CONE BIOPSY    . EVALUATION UNDER ANESTHESIA WITH HEMORRHOIDECTOMY N/A 09/03/2017   Procedure: EXAM UNDER ANESTHESIA, EXCISION OF ANAL LESION, POSSIBLE HEMORRHOIDECTOMY;  Surgeon: Ileana Roup, MD;  Location: [facility];  Service: General;  Laterality: N/A;  . THYROIDECTOMY    . TONSILLECTOMY      Current Outpatient Medications  Medication Sig Dispense Refill  . acetaminophen (TYLENOL) 325 MG tablet Take 650 mg by mouth every 6 (six) hours as needed (pain).    Marland Kitchen buPROPion (WELLBUTRIN XL) 150 MG 24 hr tablet Take 300 mg by mouth daily.     . Calcium Carbonate-Vitamin D 600-400 MG-UNIT per tablet Take 1 tablet by mouth  daily.    . COMBIGAN 0.2-0.5 % ophthalmic solution Apply 2 drops to eye 2 (two) times daily. Left Eye    . dabigatran (PRADAXA) 150 MG CAPS capsule Take 1 capsule (150 mg total) by mouth every 12 (twelve) hours. 180 capsule 3  . diazepam (VALIUM) 5 MG tablet Take 0.5 tablets (2.5 mg total) by mouth every 6 (six) hours as needed for anxiety. 30 tablet 0  . diltiazem (CARDIZEM CD) 180 MG 24 hr capsule Take 180 mg by mouth daily.    Marland Kitchen donepezil (ARICEPT) 5 MG tablet Take 5 mg by mouth at bedtime.    . gabapentin (NEURONTIN) 100 MG capsule Take 1 capsule (100 mg total) by mouth 2 (two) times daily for 3 days. 60 capsule 0  . latanoprost (XALATAN) 0.005 % ophthalmic solution Place 2 drops into the left eye 2 (two) times daily.    Marland Kitchen LORazepam (ATIVAN) 0.5 MG tablet Take 0.5 mg by mouth every 8 (eight) hours as needed for anxiety.    . Melatonin 5 MG TABS Take 5 mg by mouth at bedtime.    . memantine (NAMENDA) 5 MG tablet Take 5 mg by mouth daily.    . methylPREDNISolone (MEDROL DOSEPAK) 4 MG TBPK tablet Take as directed. 21 tablet 0  . Multiple Vitamins-Minerals (MULTIVITAMINS THER. W/MINERALS) TABS tablet Take 1 tablet by mouth daily.    . Multiple Vitamins-Minerals (PRESERVISION AREDS) CAPS Take 1 capsule  daily by mouth.    . oxyCODONE (OXYCONTIN) 10 mg 12 hr tablet Take 1 tablet (10 mg total) by mouth every 12 (twelve) hours. 14 tablet 0  . oxyCODONE-acetaminophen (PERCOCET) 5-325 MG tablet Take 1-2 tablets by mouth every 6 (six) hours as needed. 20 tablet 0  . pravastatin (PRAVACHOL) 40 MG tablet Take 40 mg by mouth daily.     Marland Kitchen PREMPRO 0.625-5 MG per tablet Take 1 tablet by mouth daily.   3  . Probiotic Product (ALIGN) 4 MG CAPS Take 1 capsule by mouth daily.    . tapentadol (NUCYNTA) 50 MG tablet Take 1 tablet (50 mg total) by mouth every 6 (six) hours as needed for up to 7 days. 30 tablet 0   Current Facility-Administered Medications  Medication Dose Route Frequency Provider Last Rate Last  Dose  . oxyCODONE (OXYCONTIN) 12 hr tablet 10 mg  10 mg Oral Q12H Bethany Oto, MD        Allergies:   Ambien [zolpidem tartrate]; Fioricet-codeine [butalbital-apap-caff-cod]; Macrobid [nitrofurantoin]; Codeine; and Keflex [cephalexin]   Social History:  The patient  reports that she quit smoking about 29 years ago. She has never used smokeless tobacco. She reports that she does not drink alcohol or use drugs.   Family History:  The patient's  family history includes Cancer (age of onset: 16) in her father; Cerebral aneurysm (age of onset: 43) in her mother; Heart attack in her brother.    ROS:  Please see the history of present illness.  All other systems are reviewed and negative.    PHYSICAL EXAM: VS:  BP 128/86   Pulse 81   Ht 5\' 5"  (1.651 m)   Wt 170 lb 6.4 oz (77.3 kg)   SpO2 95%   BMI 28.36 kg/m  , BMI Body mass index is 28.36 kg/m. GEN: elderly woman, in no acute distress  HEENT: normal  Neck: no JVD, no masses. No carotid bruits Cardiac: RRR without murmur or gallop      Respiratory:  clear to auscultation bilaterally, normal work of breathing GI: soft, nontender, nondistended, + BS MS: no deformity or atrophy  Ext: trace bilateral pretibial edema, pedal pulses 2+= bilaterally Skin: warm and dry, no rash Neuro:  Strength and sensation are intact Psych: euthymic mood, full affect  EKG:  EKG is not ordered today.  Recent Labs: 10/24/2017: ALT 15; BUN 9; Creatinine, Ser 0.83; Hemoglobin 12.9; Platelets 193; Potassium 4.0; Sodium 140   Lipid Panel     Component Value Date/Time   CHOL 148 12/21/2016   TRIG 165 (A) 12/21/2016   HDL 39 12/21/2016   LDLCALC 76 12/21/2016      Wt Readings from Last 3 Encounters:  01/12/18 170 lb 6.4 oz (77.3 kg)  01/09/18 170 lb (77.1 kg)  01/01/18 170 lb (77.1 kg)     Cardiac Studies Reviewed: 2D Echo 04-17-2016: Study Conclusions  - Left ventricle: The cavity size was normal. There was mild   concentric hypertrophy.  Systolic function was normal. The   estimated ejection fraction was in the range of 50% to 55%. Wall   motion was normal; there were no regional wall motion   abnormalities. Doppler parameters are consistent with abnormal   left ventricular relaxation (grade 1 diastolic dysfunction).   There was no evidence of elevated ventricular filling pressure by   Doppler parameters. - Aortic valve: Trileaflet; normal thickness leaflets. There was   mild regurgitation. - Aortic root: The aortic root was normal in  size. - Ascending aorta: The ascending aorta was normal in size. - Mitral valve: Structurally normal valve. There was trivial   regurgitation. - Left atrium: The atrium was normal in size. - Right ventricle: The cavity size was normal. Wall thickness was   normal. Systolic function was normal. - Right atrium: The atrium was normal in size. - Tricuspid valve: There was trivial regurgitation. - Pulmonic valve: There was no regurgitation. - Pulmonary arteries: Systolic pressure was within the normal   range. - Inferior vena cava: The vessel was normal in size. - Pericardium, extracardiac: There was no pericardial effusion.  ASSESSMENT AND PLAN: 1.  Paroxysmal atrial fibrillation: This patients CHA2DS2-VASc Score and unadjusted Ischemic Stroke Rate (% per year) is equal to 4.8 % stroke rate/year from a score of 4  Above score calculated as 1 point each if present [CHF, HTN, DM, Vascular=MI/PAD/Aortic Plaque, Age if 65-74, or Female] Above score calculated as 2 points each if present [Age > 75, or Stroke/TIA/TE]  She is maintaining sinus rhythm and seems to be tolerating oral anticoagulation with Pradaxa.  She is currently off of Pradaxa for spinal injections.  2. HTN: Blood pressure is controlled on diltiazem  3. ASD: small secundum ASD. No indication for device closure or surgery. Continue clinical FU.   Current medicines are reviewed with the patient today.  The patient does not have  concerns regarding medicines.  Labs/ tests ordered today include:  No orders of the defined types were placed in this encounter.   Disposition:   FU 6 months  Signed, Bethany Mocha, MD  01/12/2018 4:48 PM    [address], [place], [place]  [id number] Phone: [phone]; Fax: [phone]

## 2018-01-12 NOTE — Telephone Encounter (Signed)
Patient called advised the Rx (Nucynta) did not work either. She had to stop taking it.

## 2018-01-12 NOTE — Telephone Encounter (Signed)
Patient called advised the Rx (Nucynta) did not work either. She had to stop taking it. The number to contact patient is [phone]

## 2018-01-13 ENCOUNTER — Telehealth (INDEPENDENT_AMBULATORY_CARE_PROVIDER_SITE_OTHER): Payer: Self-pay | Admitting: Specialist

## 2018-01-13 NOTE — Telephone Encounter (Signed)
Joycelyn Schmid called back advising that patient is not even eligible for home health because she is not home bound. So she will need order for physical therapy sent to an outpatient facility. Joycelyn Schmid said she will be leaving at 3 today, so if you will reach out to [name] about this at patients home # [phone]

## 2018-01-13 NOTE — Telephone Encounter (Signed)
Margaret/[facility] called and not clear on patients orders for PT.  Please call her to clarify what patient has coming up.  [phone]

## 2018-01-14 NOTE — Telephone Encounter (Signed)
Joycelyn Schmid called back advising that patient is not even eligible for home health because she is not home bound. So she will need order for physical therapy sent to an outpatient facility. Joycelyn Schmid said she will be leaving at 3 today, so if you will reach out to [place] about this at patients home

## 2018-01-15 ENCOUNTER — Ambulatory Visit (INDEPENDENT_AMBULATORY_CARE_PROVIDER_SITE_OTHER): Payer: Medicare Other | Admitting: Physical Medicine and Rehabilitation

## 2018-01-15 ENCOUNTER — Ambulatory Visit (INDEPENDENT_AMBULATORY_CARE_PROVIDER_SITE_OTHER): Payer: Medicare Other

## 2018-01-15 ENCOUNTER — Encounter (INDEPENDENT_AMBULATORY_CARE_PROVIDER_SITE_OTHER): Payer: Self-pay | Admitting: Physical Medicine and Rehabilitation

## 2018-01-15 VITALS — BP 140/79 | HR 73

## 2018-01-15 DIAGNOSIS — M5116 Intervertebral disc disorders with radiculopathy, lumbar region: Secondary | ICD-10-CM | POA: Diagnosis not present

## 2018-01-15 DIAGNOSIS — M5416 Radiculopathy, lumbar region: Secondary | ICD-10-CM

## 2018-01-15 MED ORDER — METHYLPREDNISOLONE ACETATE 80 MG/ML IJ SUSP
80.0000 mg | Freq: Once | INTRAMUSCULAR | Status: AC
  Administered 2018-01-15: 80 mg

## 2018-01-15 NOTE — Patient Instructions (Signed)

## 2018-01-15 NOTE — Progress Notes (Signed)
 .  Numeric Pain Rating Scale and Functional Assessment Average Pain 9   In the last MONTH (on 0-10 scale) has pain interfered with the following?  1. General activity like being  able to carry out your everyday physical activities such as walking, climbing stairs, carrying groceries, or moving a chair?  Rating(7)   +Driver, +BT(stopped prodaxa 5 days ago), -Dye Allergies.

## 2018-01-19 ENCOUNTER — Encounter: Payer: Self-pay | Admitting: Cardiovascular Disease

## 2018-01-19 ENCOUNTER — Telehealth (INDEPENDENT_AMBULATORY_CARE_PROVIDER_SITE_OTHER): Payer: Self-pay

## 2018-01-19 NOTE — Progress Notes (Signed)
Pt called concerning her severe back pain. She wants to try NSAID such as ibuprofen. Advised ok to try this for a few days to see if any relief. She is unable to tolerate narcotic analgesics and tylenol doesn't help. If she has good relief from ibuprofen, will consider discontinuation of pradaxa. She understands pradaxa and ibuprofen shouldn't be used concomitantly for a long duration, but would be ok to trial this over 2-3 days. Will touch base with her mid-week to check on her response.   Sherren Mocha 01/19/2018 3:20 PM

## 2018-01-19 NOTE — Telephone Encounter (Signed)
Margaret with [facility] called stating that patient is in a lot of pain and patient is taking Tylenol, but it is not helping.  Patient was also taking narcotics, but they were making her nauseated.  Patient did have an injection with Dr. Ernestina Patches on Thursday, 01/15/18 and was fine over the weekend.  Cb# is [phone].  Please advise.  Thank You.

## 2018-01-20 ENCOUNTER — Telehealth (INDEPENDENT_AMBULATORY_CARE_PROVIDER_SITE_OTHER): Payer: Self-pay | Admitting: *Deleted

## 2018-01-20 NOTE — Telephone Encounter (Signed)
Bethany Oconnor with [facility] called stating that patient is in a lot of pain and patient is taking Tylenol, but it is not helping.  Patient was also taking narcotics, but they were making her nauseated.  Patient did have an injection with Dr. Ernestina Patches on Thursday, 01/15/18 and was fine over the weekend.

## 2018-01-20 NOTE — Telephone Encounter (Signed)
Matrina called stating pt was seen couple weeks ago and order was placed for [facility] services with [facility] and pt still has not heard anything from any one from [facility]. I informed her that the order was placed on the 01/08/18 but I will resend it back to [facility] as URGENT so that maybe pt can be evaluated.

## 2018-01-21 ENCOUNTER — Telehealth: Payer: Self-pay | Admitting: Cardiovascular Disease

## 2018-01-21 NOTE — Telephone Encounter (Signed)
The patient reports she she talked to Dr. Burt Knack on Monday and was told she could hold Pradaxa for 2-3 days while she took Motrin for pain. She took Motrin for 1.5 days and it stopped alleviating her pain. She is going to see Dr. Louanne Skye tomorrow to see if there is another medication she can try instead.  She has restarted Pradaxa since stopping the Motrin, but wants to know from Dr. Burt Knack if she can stop taking Pradaxa altogether.  She doesn't want to take it anymore as she will probably have lots of back injections and procedures coming up.   To Dr. Burt Knack.

## 2018-01-21 NOTE — Telephone Encounter (Signed)
New Message  Pt c/o medication issue:  1. Name of Medication: dabigatran (PRADAXA) 150 MG CAPS capsule and Motrin   2. How are you currently taking this medication (dosage and times per day)? Take 1 capsule (150 mg total) by mouth every 12 (twelve) hours  3. Are you having a reaction (difficulty breathing--STAT)? no  4. What is your medication issue? Pt states that Bethany Oconnor is suppose to stop one of the meds due to not being able to take them together. Please call

## 2018-01-21 NOTE — Telephone Encounter (Signed)
She misunderstood my instructions. I told her she could take motrin for a few days while taking pradaxa, but I don't want her taking both medications together for a long period of time. I'm happy to talk to her about stopping pradaxa, but she needs to understand the pros and cons with respect to stroke risk.

## 2018-01-22 ENCOUNTER — Ambulatory Visit (INDEPENDENT_AMBULATORY_CARE_PROVIDER_SITE_OTHER): Payer: Medicare Other | Admitting: Specialist

## 2018-01-22 ENCOUNTER — Telehealth (INDEPENDENT_AMBULATORY_CARE_PROVIDER_SITE_OTHER): Payer: Self-pay | Admitting: Specialist

## 2018-01-22 ENCOUNTER — Encounter (INDEPENDENT_AMBULATORY_CARE_PROVIDER_SITE_OTHER): Payer: Self-pay | Admitting: Specialist

## 2018-01-22 VITALS — BP 133/74 | HR 70 | Ht 65.0 in | Wt 166.5 lb

## 2018-01-22 DIAGNOSIS — M48062 Spinal stenosis, lumbar region with neurogenic claudication: Secondary | ICD-10-CM

## 2018-01-22 MED ORDER — IBUPROFEN 400 MG PO TABS: 400.0000 mg | ORAL_TABLET | Freq: Three times a day (TID) | ORAL | 0 refills | Status: DC | PRN

## 2018-01-22 NOTE — Telephone Encounter (Signed)
Follow Up: ° ° ° ° °Pt returning your call. °

## 2018-01-22 NOTE — Telephone Encounter (Signed)
Patient wishes to schedule appointment with Dr. Burt Knack to discuss stopping Pradaxa.  Scheduled her 6/17.

## 2018-01-22 NOTE — Progress Notes (Signed)
Office Visit Note   Patient: Bethany Oconnor           Date of Birth: 1940-11-08           MRN: [id number] Visit Date: 01/22/2018              Requested by: Crist Infante, MD [address] [place], [place] [address] PCP: Crist Infante, MD   Assessment & Plan: Visit Diagnoses:  1. Spinal stenosis of lumbar region with neurogenic claudication     Plan: Avoid bending, stooping and avoid lifting weights greater than 10 lbs. Avoid prolong standing and walking. Avoid frequent bending and stooping  No lifting greater than 10 lbs. May use ice or moist heat for pain. Weight loss is of benefit. Handicap license is approved.  Pt at [facility] at [facility]  Try Motrin 400 mg every 8 hours, note to Dr. Sherren Mocha for whether he can stop pradaxa  Follow-Up Instructions: Return in about 1 month (around 02/19/2018).   Orders:  Orders Placed This Encounter  Procedures  . Ambulatory referral to Physical Therapy   Meds ordered this encounter  Medications  . ibuprofen (ADVIL,MOTRIN) 400 MG tablet    Sig: Take 1 tablet (400 mg total) by mouth every 8 (eight) hours as needed for moderate pain.    Dispense:  60 tablet    Refill:  0      Procedures: No procedures performed   Clinical Data: No additional findings.   Subjective: Chief Complaint  Patient presents with  . Lower Back - Follow-up    Follow up after L4-5, L5-S1 Trans injection, she states that she did not get any relief at all from this injection.    77 year old female with history of back and right leg pain she is having pain with standing and walking. She is having chronic eye difficulties and is  Having wobbly gait.    Review of Systems  Constitutional: Negative.   HENT: Negative.   Eyes: Negative.   Respiratory: Negative.   Cardiovascular: Negative.   Gastrointestinal: Negative.   Endocrine: Negative.   Genitourinary: Negative.   Musculoskeletal: Negative.   Skin: Negative.     Allergic/Immunologic: Negative.   Neurological: Negative.   Hematological: Negative.   Psychiatric/Behavioral: Negative.      Objective: Vital Signs: BP 133/74 (BP Location: Left Arm, Patient Position: Sitting)   Pulse 70   Ht 5\' 5"  (1.651 m)   Wt 166 lb 8 oz (75.5 kg)   BMI 27.71 kg/m   Physical Exam  Constitutional: She is oriented to person, place, and time. She appears well-developed and well-nourished.  HENT:  Head: Normocephalic and atraumatic.  Eyes: Pupils are equal, round, and reactive to light. EOM are normal.  Neck: Normal range of motion. Neck supple.  Pulmonary/Chest: Effort normal and breath sounds normal.  Abdominal: Soft. Bowel sounds are normal.  Musculoskeletal: Normal range of motion.  Neurological: She is alert and oriented to person, place, and time.  Skin: Skin is warm and dry.  Psychiatric: She has a normal mood and affect. Her behavior is normal. Judgment and thought content normal.    Ortho Exam  Specialty Comments:  No specialty comments available.  Imaging: No results found.   PMFS History: Patient Active Problem List   Diagnosis Date Noted  . Cerebral aneurysm   . Dementia with behavioral disturbance 12/20/2016  . Aggressive behavior   . CLL (chronic lymphocytic leukemia) ([place]) 09/14/2014  . Ostium secundum type atrial  septal defect 05/19/2012  . HYPERTENSION, BENIGN 05/31/2010  . ATRIAL FIBRILLATION 05/31/2010   Past Medical History:  Diagnosis Date  . Anxiety   . Cerebral aneurysm    s/p endovascular colling 2008  . Depression   . Hearing loss   . Macular degeneration   . Neuropathy   . Paroxysmal atrial fibrillation (HCC)   . Pneumonia    08-04-17    Family History  Problem Relation Age of Onset  . Cancer Father 40       died  . Cerebral aneurysm Mother 71       died  . Heart attack Brother     Past Surgical History:  Procedure Laterality Date  . ANEURYSM COILING    . CATARACT EXTRACTION     bilateral  .  CERVICAL CONE BIOPSY    . EVALUATION UNDER ANESTHESIA WITH HEMORRHOIDECTOMY N/A 09/03/2017   Procedure: EXAM UNDER ANESTHESIA, EXCISION OF ANAL LESION, POSSIBLE HEMORRHOIDECTOMY;  Surgeon: Ileana Roup, MD;  Location: [facility];  Service: General;  Laterality: N/A;  . THYROIDECTOMY    . TONSILLECTOMY     Social History   Occupational History    Employer: RETIRED  Tobacco Use  . Smoking status: Former Smoker    Last attempt to quit: 09/02/1988    Years since quitting: 29.4  . Smokeless tobacco: Never Used  Substance and Sexual Activity  . Alcohol use: No    Alcohol/week: 0.6 - 1.2 oz    Types: 1 - 2 Glasses of wine per week    Comment: daily for 50 years  No longer drinks 09-01-17  . Drug use: No  . Sexual activity: Not Currently

## 2018-01-22 NOTE — Patient Instructions (Signed)
Avoid bending, stooping and avoid lifting weights greater than 10 lbs. Avoid prolong standing and walking. Avoid frequent bending and stooping  No lifting greater than 10 lbs. May use ice or moist heat for pain. Weight loss is of benefit. Handicap license is approved.  Pt at [facility] Pt at [facility]  Try Motrin 400 mg every 8 hours, note to Dr. Sherren Mocha for whether he can stop pradaxa.

## 2018-01-22 NOTE — Telephone Encounter (Signed)
Left message to call back  

## 2018-01-22 NOTE — Telephone Encounter (Signed)
Pharmacy called and said patient was prescribed ibuprofen 400 mg today but patient also takes pradaxa 150 mg. Pharmacist stated it is not recommended to take both together. So there could be a possible drug interaction. Please advise # [phone]

## 2018-01-23 NOTE — Telephone Encounter (Signed)
Pharmacy called and said patient was prescribed ibuprofen 400 mg today but patient also takes pradaxa 150 mg. Pharmacist stated it is not recommended to take both together. So there could be a possible drug interaction.

## 2018-01-29 ENCOUNTER — Encounter: Payer: Self-pay | Admitting: Physical Therapy

## 2018-01-29 ENCOUNTER — Ambulatory Visit: Payer: Medicare Other | Attending: Specialist | Admitting: Physical Therapy

## 2018-01-29 ENCOUNTER — Other Ambulatory Visit: Payer: Self-pay

## 2018-01-29 DIAGNOSIS — R2689 Other abnormalities of gait and mobility: Secondary | ICD-10-CM | POA: Insufficient documentation

## 2018-01-29 DIAGNOSIS — M6281 Muscle weakness (generalized): Secondary | ICD-10-CM | POA: Insufficient documentation

## 2018-01-29 DIAGNOSIS — M545 Low back pain: Secondary | ICD-10-CM | POA: Insufficient documentation

## 2018-01-29 NOTE — Progress Notes (Signed)
Bethany Oconnor - 77 y.o. female MRN [id number]  Date of birth: [birth year]  Office Visit Note: Visit Date: 01/15/2018 PCP: Crist Infante, MD Referred by: Crist Infante, MD  Subjective: Chief Complaint  Patient presents with  . Lower Back - Pain   HPI: Bethany Oconnor is a 77 year old female with worsening severe right lower back and hip pain.  Prior transforaminal injection on the right at L4 and L5 with good flow of contrast offered her no relief at all.  She reports that bending down makes her pain worse and nothing really makes it better.  Dr. Louanne Skye has somewhat of an unorthodox request for interlaminar epidural steroid injection at L4-5 and L5-S1.  Typically the medication once in the epidural space unless there is significant canal stenosis should spread medication several levels.  However given the fact that she did not get relief and he feels like this may help we are going to perform right L4-5 and L5-S1 interlaminar epidural steroid injections.  She has been off her anticoagulant for an appropriate manner and she will return to taking this at her regularly scheduled interval.   ROS Otherwise per HPI.  Assessment & Plan: Visit Diagnoses:  1. Lumbar radiculopathy   2. Radiculopathy due to lumbar intervertebral disc disorder     Plan: No additional findings.   Meds & Orders:  Meds ordered this encounter  Medications  . methylPREDNISolone acetate (DEPO-MEDROL) injection 80 mg    Orders Placed This Encounter  Procedures  . XR C-ARM NO REPORT  . Epidural Steroid injection    Follow-up: Return if symptoms worsen or fail to improve, for Dr. Louanne Skye.   Procedures: No procedures performed  Lumbar Epidural Steroid Injection - Interlaminar Approach with Fluoroscopic Guidance  Patient: Bethany Oconnor      Date of Birth: 1941/02/26 MRN: [id number] PCP: Crist Infante, MD      Visit Date: 01/15/2018   Universal Protocol:     Consent Given By: the patient  Position:  PRONE  Additional Comments: Vital signs were monitored before and after the procedure. Patient was prepped and draped in the usual sterile fashion. The correct patient, procedure, and site was verified.   Injection Procedure Details:  Procedure Site One Meds Administered:  Meds ordered this encounter  Medications  . methylPREDNISolone acetate (DEPO-MEDROL) injection 80 mg     Laterality: Right  Location/Site:  L4-L5 L5-S1  Needle size: 20 G  Needle type: Tuohy  Needle Placement: Paramedian epidural  Findings:   -Comments: Excellent flow of contrast into the epidural space.  Procedure Details: Using a paramedian approach from the side mentioned above, the region overlying the inferior lamina was localized under fluoroscopic visualization and the soft tissues overlying this structure were infiltrated with 4 ml. of 1% Lidocaine without Epinephrine. The Tuohy needle was inserted into the epidural space using a paramedian approach.   The epidural space was localized using loss of resistance along with lateral and bi-planar fluoroscopic views.  After negative aspirate for air, blood, and CSF, a 2 ml. volume of Isovue-250 was injected into the epidural space and the flow of contrast was observed. Radiographs were obtained for documentation purposes.    The injectate was administered into the level noted above.   Additional Comments:  The patient tolerated the procedure well Dressing: Band-Aid    Post-procedure details: Patient was observed during the procedure. Post-procedure instructions were reviewed.  Patient left the clinic in stable condition.   Clinical History: MRI LUMBAR SPINE WITHOUT CONTRAST  TECHNIQUE: Multiplanar, multisequence MR imaging of the lumbar spine was performed. No intravenous contrast was administered.  COMPARISON:  12/31/2012  FINDINGS: Segmentation:  Standard.  Alignment: Mild lumbar dextroscoliosis with apex at L2-3.  Trace retrolisthesis with slight kyphosis at L2-3.  Vertebrae: Heterogeneous bone marrow signal diffusely without suspicious focal lesion identified. No fracture or significant marrow edema. Chronic multilevel degenerative endplate changes, mildly progressed on the left at L2-3.  Conus medullaris and cauda equina: Conus extends to the upper L1 level. Conus and cauda equina appear normal.  Paraspinal and other soft tissues: Unchanged 17 mm T2 hyperintense lesion in the interpolar right kidney compatible with a cyst.  Disc levels:  Disc desiccation throughout the lumbar spine. Progressive, moderate to severe left-sided disc space narrowing at L2-3 with similar appearance of moderate disc space narrowing at L4-5 and L5-S1.  T12-L1: Mild facet arthrosis without disc herniation or stenosis.  L1-2: Moderate right and mild left facet hypertrophy without disc herniation or stenosis, unchanged.  L2-3: Circumferential disc bulging and moderate facet and ligamentum flavum hypertrophy result in mild spinal stenosis, mild right and moderate left lateral recess stenosis, and minimal left neural foraminal narrowing. Disc space narrowing and degenerative endplate changes have mildly progressed although the volume of bulging disc posteriorly has slightly decreased, and the degree of stenosis at this level has not significantly changed. Small facet joint effusions.  L3-4: Circumferential disc bulging and moderate facet and ligamentum flavum hypertrophy result in mild spinal stenosis and mild-to-moderate bilateral lateral recess stenosis without significant neural foraminal stenosis, unchanged. Small left facet joint effusion.  L4-5: Circumferential disc bulging, a new small right subarticular disc extrusion with slight superior migration, and moderate right and mild left facet and ligamentum flavum hypertrophy result in moderate to severe right and moderate left lateral recess  stenosis and moderate right and mild left neural foraminal stenosis. Right lateral recess stenosis has progressed and may affect the right L5 nerve root. Right neural foraminal stenosis is similar to the prior study and may affect the right L4 nerve root.  L5-S1: Circumferential disc bulging, a right paracentral disc protrusion, and a right foraminal and extraforaminal disc protrusion result in moderate right and mild left lateral recess stenosis and severe right greater than left neural foraminal stenosis. Right lateral recess and right neural foraminal stenosis have not significantly changed and may result in L5 and S1 nerve root impingement. Left neural foraminal stenosis has worsened and may result in left L5 nerve root impingement. No significant spinal stenosis.  IMPRESSION: 1. New small right subarticular disc extrusion at L4-5 with increased right lateral recess stenosis and potential L5 nerve root impingement. 2. Increased, severe left neural foraminal stenosis an unchanged severe right neural foraminal stenosis at L5-S1. 3. Unchanged right paracentral disc protrusion with right lateral recess stenosis at L5-S1. 4. Mildly progressive disc degeneration at L2-3 with similar mild spinal stenosis and mild right and moderate left lateral recess stenosis.   Electronically Signed   By: Logan Bores M.D.   On: 12/21/2017 10:05   She reports that she quit smoking about 29 years ago. She has never used smokeless tobacco.  Recent Labs    09/01/17 0908  HGBA1C 5.7*    Objective:  VS:  HT:    WT:   BMI:     BP:140/79  HR:73bpm  TEMP: ( )  RESP:  Physical Exam  Ortho Exam Imaging: No results found.  Past Medical/Family/Surgical/Social History: Medications & Allergies reviewed per EMR, new medications updated. Patient Active  Problem List   Diagnosis Date Noted  . Cerebral aneurysm   . Dementia with behavioral disturbance 12/20/2016  . Aggressive behavior   .  CLL (chronic lymphocytic leukemia) ([place]) 09/14/2014  . 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 HYPERTENSION, BENIGN 05/31/2010  . ATRIAL FIBRILLATION 05/31/2010   Past Medical History:  Diagnosis Date  . Anxiety   . Cerebral aneurysm    s/p endovascular colling 2008  . Depression   . Hearing loss   . Macular degeneration   . Neuropathy   . Paroxysmal atrial fibrillation (HCC)   . Pneumonia    08-04-17   Family History  Problem Relation Age of Onset  . Cancer Father 9       died  . Cerebral aneurysm Mother 93       died  . Heart attack Brother    Past Surgical History:  Procedure Laterality Date  . ANEURYSM COILING    . CATARACT EXTRACTION     bilateral  . CERVICAL CONE BIOPSY    . EVALUATION UNDER ANESTHESIA WITH HEMORRHOIDECTOMY N/A 09/03/2017   Procedure: EXAM UNDER ANESTHESIA, EXCISION OF ANAL LESION, POSSIBLE HEMORRHOIDECTOMY;  Surgeon: Ileana Roup, MD;  Location: [facility];  Service: General;  Laterality: N/A;  . THYROIDECTOMY    . TONSILLECTOMY     Social History   Occupational History    Employer: RETIRED  Tobacco Use  . Smoking status: Former Smoker    Last attempt to quit: 09/02/1988    Years since quitting: 29.4  . Smokeless tobacco: Never Used  Substance and Sexual Activity  . Alcohol use: No    Alcohol/week: 0.6 - 1.2 oz    Types: 1 - 2 Glasses of wine per week    Comment: daily for 50 years  No longer drinks 09-01-17  . Drug use: No  . Sexual activity: Not Currently

## 2018-01-29 NOTE — Therapy (Signed)
[facility] [address] [place], [place], [address] Phone: [phone]   Fax:  [phone]  Physical Therapy Evaluation  Patient Details  Name: Bethany Oconnor MRN: [id number] Date of Birth: [birth year] Referring Provider: Dr Basil Dess   Encounter Date: 01/29/2018  PT End of Session - 01/29/18 1501    Visit Number  1    Date for PT Re-Evaluation  03/26/18    PT Start Time  1100    PT Stop Time  1145    PT Time Calculation (min)  45 min    Activity Tolerance  Patient tolerated treatment well    Behavior During Therapy  [facility] for tasks assessed/performed       Past Medical History:  Diagnosis Date  . Anxiety   . Cerebral aneurysm    s/p endovascular colling 2008  . Depression   . Hearing loss   . Macular degeneration   . Neuropathy   . Paroxysmal atrial fibrillation (HCC)   . Pneumonia    08-04-17    Past Surgical History:  Procedure Laterality Date  . ANEURYSM COILING    . CATARACT EXTRACTION     bilateral  . CERVICAL CONE BIOPSY    . EVALUATION UNDER ANESTHESIA WITH HEMORRHOIDECTOMY N/A 09/03/2017   Procedure: EXAM UNDER ANESTHESIA, EXCISION OF ANAL LESION, POSSIBLE HEMORRHOIDECTOMY;  Surgeon: Ileana Roup, MD;  Location: [facility];  Service: General;  Laterality: N/A;  . THYROIDECTOMY    . TONSILLECTOMY      There were no vitals filed for this visit.   Subjective Assessment - 01/29/18 1109    Subjective  Patient started to have issues with her back 3 months ago due to the herniated disk.  Patient reports she wobbles and is unsteady.  Patient is legally blind.  Patient is losing the strength of her legs.  She has prickly feeling of her legs. Patient has around the clock caregiver.     Patient Stated Goals  reduce pain, increase strength of back and legs    Currently in Pain?  Yes    Pain Score  8     Pain Location  Back    Pain Orientation  Lower    Pain Type  Acute pain    Pain Onset   More than a month ago    Pain Frequency  Constant    Aggravating Factors   movement, slouch    Pain Relieving Factors  sit up straight    Multiple Pain Sites  No         [facility] PT Assessment - 01/29/18 0001      Assessment   Medical Diagnosis  M48.062 Spinal stenosis of lumbar region with neurogenic claudication    Referring Provider  Dr Basil Dess    Onset Date/Surgical Date  10/30/17    Prior Therapy  None      Precautions   Precautions  Other (comment);Back    Precaution Comments  Blind; Avoid bending, stooping, lifting weigh > 10#, prolonged standing, and walking      Restrictions   Weight Bearing Restrictions  No      Balance Screen   Has the patient fallen in the past 6 months  No    Has the patient had a decrease in activity level because of a fear of falling?   No    Is the patient reluctant to leave their home because of a fear of falling?   No  Home Environment   Living Environment  Assisted living    Living Arrangements  Alone      Prior Function   Level of [place] with basic ADLs    Vocation  Retired    Leisure  yoga      Cognition   Overall Cognitive Status  Within Functional Limits for tasks assessed      Observation/Other Assessments   Focus on Therapeutic Outcomes (FOTO)   76% limitation; goal is 52% limitation      Sensation   Light Touch  Appears Intact bil. lower extremities      Posture/Postural Control   Posture/Postural Control  No significant limitations      ROM / Strength   AROM / PROM / Strength  Strength;AROM      AROM   Overall AROM Comments  lumbar ROM is limited due to doctors orders      Strength   Right Hip Flexion  4/5    Right Hip ABduction  3/5    Left Hip Flexion  4/5    Left Hip ABduction  3/5    Right Knee Flexion  3+/5    Right Knee Extension  3+/5    Left Knee Flexion  3+/5    Left Knee Extension  3+/5    Right Ankle Plantar Flexion  3/5    Right Ankle Eversion  3/5    Left Ankle Plantar  Flexion  3/5    Left Ankle Eversion  3/5      Ambulation/Gait   Ambulation/Gait  Yes    Assistive device  Rolling walker    Gait Pattern  Decreased stride length;Decreased dorsiflexion - left;Decreased dorsiflexion - right;Trunk flexed;Wide base of support    Gait Comments  Patient is blind so she needs someone to be close to direct her; Patient does walk with an erect spine      Standardized Balance Assessment   Five times sit to stand comments   16 sec      High Level Balance   High Level Balance Comments  reach forward 2 inches; stand 1.10 min; unable to stand and bring her feet together                Objective measurements completed on examination: See above findings.                PT Short Term Goals - 01/29/18 1515      PT SHORT TERM GOAL #1   Title  independent with initial HEP for stretching her hamstrings and lumbar flexion exercises    Time  4    Period  Weeks    Status  New    Target Date  02/26/18      PT SHORT TERM GOAL #2   Title  understand how to use home TENS unit to manage pain with caretaker able to place electodes on patient    Time  4    Period  Weeks    Status  New    Target Date  02/26/18        PT Long Term Goals - 01/29/18 1516      PT LONG TERM GOAL #1   Title  ability to perform HEP for core and LE strength with 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    Time  8    Period  Weeks    Status  New    Target Date  03/26/18  PT LONG TERM GOAL #2   Title  able to perform correct body mechanics with daily tasks to reduce strain in on her lumbar to manage her back pain    Time  8    Period  Weeks    Status  New    Target Date  03/26/18      PT LONG TERM GOAL #3   Title  sit to stand </= 13 seconds due to improved bilateral lower extremity strength >/= 4/5 and improved balance    Time  8    Period  Weeks    Status  New    Target Date  03/26/18      PT LONG TERM GOAL #4   Title  lumbar pain with daily  activities are moderate due to ability managing her pain    Time  8    Period  Weeks    Status  New    Target Date  03/26/18             Plan - 01/29/18 1502    Clinical Impression Statement  Patient is a 77 year old female with lumbar pain due to spinal stenosis and neurogenic claudication for the past 3 months.  Patient reports her constant lumbar pain level is 8/10 with movement and daily tasks. Patient reports creepy crawly feeling in bilateral legs.  Patient is walking with a rolling walker due to back pain, leg weakness and being blind.  Patient ambulates with an erect posture, decreased step length, and needs someone with her due to being blind.  Patient balance is decreased due to reaching forward 2 inches, stand by herself for 1 minute, and unable to bring her feet together.  Sit to stand test was 16 seconds when norm is 13 seconds indicating a risk of falls.  Lumbar ROM was not evaluated due to MD restrictions.  Bilateral hip strength 3-4/10, knees 3+/5, and ankle 3/5.  Light touch on bilateral lower extremities area equal and intact. Patient will benefit from skilled therapy to improve balance, lower extremity strength and education on ways to manage her pain.     History and Personal Factors relevant to plan of care:  Wobbly gait, Macular degeneration ( legally Blind), Hearing loss (right ear better), Neuropathy, Herniated disk    Clinical Presentation  Evolving    Clinical Presentation due to:  Patient is not able to do her daily chores she was doing 3 months ago and dress herself due to lumbar pain    Clinical Decision Making  Moderate    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    PT Frequency  2x / week    PT Duration  8 weeks    PT Treatment/Interventions  Cryotherapy;Electrical  Stimulation;Other (comment);Moist Heat;Ultrasound;Traction;Therapeutic exercise;Balance training;Therapeutic activities;Gait training;Neuromuscular re-education;Patient/family education;Manual techniques;Dry needling;Energy conservation Home TENS unit    PT Next Visit Plan  body mechanics to decrease strain on lumbar; posture; lumbar flexion exercises; hamstring stretching, LE strengthening    PT Home Exercise Plan  progress as needed    Consulted and Agree with Plan of Care  Patient       Patient will benefit from skilled therapeutic intervention in order to improve the following deficits and impairments:  Abnormal gait, Pain, Increased muscle spasms, Decreased endurance, Decreased activity tolerance, Decreased strength, Difficulty  walking, Impaired flexibility  Visit Diagnosis: Acute bilateral low back pain, with sciatica presence unspecified - Plan: PT plan of care cert/re-cert  Muscle weakness (generalized) - Plan: PT plan of care cert/re-cert  Other abnormalities of gait and mobility - Plan: PT plan of care cert/re-cert     Problem List Patient Active Problem List   Diagnosis Date Noted  . Cerebral aneurysm   . Dementia with behavioral disturbance 12/20/2016  . Aggressive behavior   . CLL (chronic lymphocytic leukemia) ([place]) 09/14/2014  . 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 HYPERTENSION, BENIGN 05/31/2010  . ATRIAL FIBRILLATION 05/31/2010    Earlie Counts, PT 01/29/18 3:22 PM   [facility] [address], [place], [place], [address] Phone: [phone]   Fax:  [phone]  Name: Bethany Oconnor MRN: [id number] Date of Birth: March 22, 1941

## 2018-01-29 NOTE — Procedures (Signed)
Lumbar Epidural Steroid Injection - Interlaminar Approach with Fluoroscopic Guidance  Patient: Bethany Oconnor      Date of Birth: 1941-05-08 MRN: [id number] PCP: Crist Infante, MD      Visit Date: 01/15/2018   Universal Protocol:     Consent Given By: the patient  Position: PRONE  Additional Comments: Vital signs were monitored before and after the procedure. Patient was prepped and draped in the usual sterile fashion. The correct patient, procedure, and site was verified.   Injection Procedure Details:  Procedure Site One Meds Administered:  Meds ordered this encounter  Medications  . methylPREDNISolone acetate (DEPO-MEDROL) injection 80 mg     Laterality: Right  Location/Site:  L4-L5 L5-S1  Needle size: 20 G  Needle type: Tuohy  Needle Placement: Paramedian epidural  Findings:   -Comments: Excellent flow of contrast into the epidural space.  Procedure Details: Using a paramedian approach from the side mentioned above, the region overlying the inferior lamina was localized under fluoroscopic visualization and the soft tissues overlying this structure were infiltrated with 4 ml. of 1% Lidocaine without Epinephrine. The Tuohy needle was inserted into the epidural space using a paramedian approach.   The epidural space was localized using loss of resistance along with lateral and bi-planar fluoroscopic views.  After negative aspirate for air, blood, and CSF, a 2 ml. volume of Isovue-250 was injected into the epidural space and the flow of contrast was observed. Radiographs were obtained for documentation purposes.    The injectate was administered into the level noted above.   Additional Comments:  The patient tolerated the procedure well Dressing: Band-Aid    Post-procedure details: Patient was observed during the procedure. Post-procedure instructions were reviewed.  Patient left the clinic in stable condition.

## 2018-02-02 ENCOUNTER — Ambulatory Visit: Payer: Medicare Other | Attending: Specialist | Admitting: Physical Therapy

## 2018-02-02 ENCOUNTER — Encounter: Payer: Self-pay | Admitting: Physical Therapy

## 2018-02-02 DIAGNOSIS — R42 Dizziness and giddiness: Secondary | ICD-10-CM | POA: Diagnosis present

## 2018-02-02 DIAGNOSIS — R2689 Other abnormalities of gait and mobility: Secondary | ICD-10-CM

## 2018-02-02 DIAGNOSIS — M6281 Muscle weakness (generalized): Secondary | ICD-10-CM | POA: Diagnosis present

## 2018-02-02 DIAGNOSIS — M545 Low back pain: Secondary | ICD-10-CM | POA: Insufficient documentation

## 2018-02-02 NOTE — Therapy (Signed)
[facility] [address], [place], [place], [address] Phone: [phone]   Fax:  [phone]  Physical Therapy Treatment  Patient Details  Name: Bethany Oconnor MRN: [id number] Date of Birth: March 22, 1941 Referring Provider: Dr Basil Dess   Encounter Date: 02/02/2018  PT End of Session - 02/02/18 1450    Visit Number  2    Date for PT Re-Evaluation  03/26/18    PT Start Time  1449    PT Stop Time  1545    PT Time Calculation (min)  56 min    Activity Tolerance  Patient tolerated treatment well    Behavior During Therapy  [facility] for tasks assessed/performed       Past Medical History:  Diagnosis Date  . Anxiety   . Cerebral aneurysm    s/p endovascular colling 2008  . Depression   . Hearing loss   . Macular degeneration   . Neuropathy   . Paroxysmal atrial fibrillation (HCC)   . Pneumonia    08-04-17    Past Surgical History:  Procedure Laterality Date  . ANEURYSM COILING    . CATARACT EXTRACTION     bilateral  . CERVICAL CONE BIOPSY    . EVALUATION UNDER ANESTHESIA WITH HEMORRHOIDECTOMY N/A 09/03/2017   Procedure: EXAM UNDER ANESTHESIA, EXCISION OF ANAL LESION, POSSIBLE HEMORRHOIDECTOMY;  Surgeon: Ileana Roup, MD;  Location: [facility];  Service: General;  Laterality: N/A;  . THYROIDECTOMY    . TONSILLECTOMY      There were no vitals filed for this visit.  Subjective Assessment - 02/02/18 1451    Subjective  I am in pain all the time.     Currently in Pain?  Yes    Pain Score  8     Pain Location  Back    Pain Orientation  Right    Pain Descriptors / Indicators  Shooting;Constant;Throbbing;Radiating;Pins and needles                       [facility] Adult PT Treatment/Exercise - 02/02/18 0001      Therapeutic Activites    Therapeutic Activities  ADL's    ADL's  Lumbar protective body mechanics      Lumbar Exercises: Stretches   Single Knee to Chest Stretch  Right;Left;3 reps;10  seconds    Other Lumbar Stretch Exercise  Bil leg lengthener stretch 5 sec each 3x      Lumbar Exercises: Supine   Ab Set  10 reps;3 seconds    Bridge  -- 3x small ROM      Moist Heat Therapy   Number Minutes Moist Heat  15 Minutes    Moist Heat Location  Lumbar Spine      Electrical Stimulation   Electrical Stimulation Location  Rt lumbar down anterior leg to knee    Electrical Stimulation Action  IFC    Electrical Stimulation Parameters  80-150 HZ to tolerance    Electrical Stimulation Goals  Pain             PT Education - 02/02/18 1527    Education Details  Body Mechanics, review of yoga stretches, TA contraction    Person(s) Educated  Patient    Methods  Explanation;Demonstration;Tactile cues;Verbal cues;Handout    Comprehension  Verbalized understanding;Returned demonstration;Verbal cues required       PT Short Term Goals - 01/29/18 1515      PT SHORT TERM GOAL #1   Title  independent  with initial HEP for stretching her hamstrings and lumbar flexion exercises    Time  4    Period  Weeks    Status  New    Target Date  02/26/18      PT SHORT TERM GOAL #2   Title  understand how to use home TENS unit to manage pain with caretaker able to place electodes on patient    Time  4    Period  Weeks    Status  New    Target Date  02/26/18        PT Long Term Goals - 01/29/18 1516      PT LONG TERM GOAL #1   Title  ability to perform HEP for core and LE strength with 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    Time  8    Period  Weeks    Status  New    Target Date  03/26/18      PT LONG TERM GOAL #2   Title  able to perform correct body mechanics with daily tasks to reduce strain in on her lumbar to manage her back pain    Time  8    Period  Weeks    Status  New    Target Date  03/26/18      PT LONG TERM GOAL #3   Title  sit to stand </= 13 seconds due to improved bilateral lower extremity strength >/= 4/5 and improved balance    Time  8     Period  Weeks    Status  New    Target Date  03/26/18      PT LONG TERM GOAL #4   Title  lumbar pain with daily activities are moderate due to ability managing her pain    Time  8    Period  Weeks    Status  New    Target Date  03/26/18            Plan - 02/02/18 1451    Clinical Impression Statement  Pt presents with high level of pain that she reports is pretty constant. She is doing yoga 4x week where she does alot of good stretching exercises. We reviewed single knee to chest , long leg stretch unilaterally, and contracting the transverse abdominus for a more regular, daily HEP.  Pt appears to have a good handle on proper body mechanics and in fact has a lot of help at home with ADLS. Pt has TENS unit at home but does nothave anyone to help her work it/put it on.  She will bring next session for help in educatin gher care givers on how to use it.     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     PT Home Exercise Plan  progress as needed    Consulted and Agree with Plan of Care  Patient       Patient will benefit from skilled therapeutic  intervention in order to improve the following deficits and impairments:  Abnormal gait, Pain, Increased muscle spasms, Decreased endurance, Decreased activity tolerance, Decreased strength, Difficulty walking, Impaired  flexibility  Visit Diagnosis: Acute bilateral low back pain, with sciatica presence unspecified  Muscle weakness (generalized)  Other abnormalities of gait and mobility  Dizziness and giddiness     Problem List Patient Active Problem List   Diagnosis Date Noted  . Cerebral aneurysm   . Dementia with behavioral disturbance 12/20/2016  . Aggressive behavior   . CLL (chronic lymphocytic leukemia) ([place]) 09/14/2014  . 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 HYPERTENSION, BENIGN 05/31/2010  . ATRIAL FIBRILLATION 05/31/2010    Olvin Rohr, PTA 02/02/2018, 4:18 PM  [facility] [address], [place], [place], [address] Phone: [phone]   Fax:  [phone]  Name: Bethany Oconnor MRN: [id number] Date of Birth: Apr 11, 1941

## 2018-02-02 NOTE — Patient Instructions (Signed)
Sleeping on Back  Place pillow under knees. A pillow with cervical support and a roll around waist are also helpful. Copyright  VHI. All rights reserved.  Sleeping on Side Place pillow between knees. Use cervical support under neck and a roll around waist as needed. Copyright  VHI. All rights reserved.   Sleeping on Stomach   If this is the only desirable sleeping position, place pillow under lower legs, and under stomach or chest as needed.  Posture - Sitting   Sit upright, head facing forward. Try using a roll to support lower back. Keep shoulders relaxed, and avoid rounded back. Keep hips level with knees. Avoid crossing legs for long periods. Stand to Sit / Sit to Stand   To sit: Bend knees to lower self onto front edge of chair, then scoot back on seat. To stand: Reverse sequence by placing one foot forward, and scoot to front of seat. Use rocking motion to stand up.   Work Height and Reach  Ideal work height is no more than 2 to 4 inches below elbow level when standing, and at elbow level when sitting. Reaching should be limited to arm's length, with elbows slightly bent.  Bending  Bend at hips and knees, not back. Keep feet shoulder-width apart.    Posture - Standing   Good posture is important. Avoid slouching and forward head thrust. Maintain curve in low back and align ears over shoul- ders, hips over ankles.  Alternating Positions   Alternate tasks and change positions frequently to reduce fatigue and muscle tension. Take rest breaks. Computer Work   Position work to face forward. Use proper work and seat height. Keep shoulders back and down, wrists straight, and elbows at right angles. Use chair that provides full back support. Add footrest and lumbar roll as needed.  Getting Into / Out of Car  Lower self onto seat, scoot back, then bring in one leg at a time. Reverse sequence to get out.  Dressing  Lie on back to pull socks or slacks over feet, or sit  and bend leg while keeping back straight.    Housework - Sink  Place one foot on ledge of cabinet under sink when standing at sink for prolonged periods.   Pushing / Pulling  Pushing is preferable to pulling. Keep back in proper alignment, and use leg muscles to do the work.  Deep Squat   Squat and lift with both arms held against upper trunk. Tighten stomach muscles without holding breath. Use smooth movements to avoid jerking.  Avoid Twisting   Avoid twisting or bending back. Pivot around using foot movements, and bend at knees if needed when reaching for articles.  Carrying Luggage   Distribute weight evenly on both sides. Use a cart whenever possible. Do not twist trunk. Move body as a unit.   Lifting Principles .Maintain proper posture and head alignment. .Slide object as close as possible before lifting. .Move obstacles out of the way. .Test before lifting; ask for help if too heavy. .Tighten stomach muscles without holding breath. .Use smooth movements; do not jerk. .Use legs to do the work, and pivot with feet. .Distribute the work load symmetrically and close to the center of trunk. .Push instead of pull whenever possible.   Ask For Help   Ask for help and delegate to others when possible. Coordinate your movements when lifting together, and maintain the low back curve.  Log Roll   Lying on back, bend left knee and place left   arm across chest. Roll all in one movement to the right. Reverse to roll to the left. Always move as one unit. Housework - Sweeping  Use long-handled equipment to avoid stooping.   Housework - Wiping  Position yourself as close as possible to reach work surface. Avoid straining your back.  Laundry - Unloading Wash   To unload small items at bottom of washer, lift leg opposite to arm being used to reach.  Slater close to area to be raked. Use arm movements to do the work. Keep back straight and avoid  twisting.     Just keep your back straight and supported. Watch brushing your teeth.    Stretches:  1. Single knee to chest: keep your knees bent, pull one knee to your chest for 20 seconds. Do 3x each leg 3x day.   2. Pull your lower tummy in & up! Breathe with this, exhale as you pull the tummy in. Hold 5 sec, do 10x. Do 3x day   Cart  When reaching into cart with one arm, lift opposite leg to keep back straight.   Getting Into / Out of Bed  Lower self to lie down on one side by raising legs and lowering head at the same time. Use arms to assist moving without twisting. Bend both knees to roll onto back if desired. To sit up, start from lying on side, and use same move-ments in reverse. Housework - Vacuuming  Hold the vacuum with arm held at side. Step back and forth to move it, keeping head up. Avoid twisting.   Laundry - IT consultant so that bending and twisting can be avoided.   Laundry - Unloading Dryer  Squat down to reach into clothes dryer or use a reacher.  Gardening - Weeding / Probation officer or Kneel. Knee pads may be helpful.

## 2018-02-04 ENCOUNTER — Encounter: Payer: Self-pay | Admitting: Physical Therapy

## 2018-02-04 ENCOUNTER — Ambulatory Visit: Payer: Medicare Other | Admitting: Physical Therapy

## 2018-02-04 DIAGNOSIS — R2689 Other abnormalities of gait and mobility: Secondary | ICD-10-CM

## 2018-02-04 DIAGNOSIS — M545 Low back pain: Secondary | ICD-10-CM | POA: Diagnosis not present

## 2018-02-04 DIAGNOSIS — M6281 Muscle weakness (generalized): Secondary | ICD-10-CM

## 2018-02-04 DIAGNOSIS — R42 Dizziness and giddiness: Secondary | ICD-10-CM

## 2018-02-04 NOTE — Therapy (Signed)
[facility] [address] [place], [place], [address] Phone: [phone]   Fax:  [phone]  Physical Therapy Treatment  Patient Details  Name: Bethany Oconnor MRN: [id number] Date of Birth: October 08, 1940 Referring Provider: Dr Basil Dess   Encounter Date: 02/04/2018  PT End of Session - 02/04/18 1448    Visit Number  3    Date for PT Re-Evaluation  03/26/18    PT Start Time  1441    PT Stop Time  1530    PT Time Calculation (min)  49 min    Activity Tolerance  Patient tolerated treatment well    Behavior During Therapy  [facility] for tasks assessed/performed       Past Medical History:  Diagnosis Date  . Anxiety   . Cerebral aneurysm    s/p endovascular colling 2008  . Depression   . Hearing loss   . Macular degeneration   . Neuropathy   . Paroxysmal atrial fibrillation (HCC)   . Pneumonia    08-04-17    Past Surgical History:  Procedure Laterality Date  . ANEURYSM COILING    . CATARACT EXTRACTION     bilateral  . CERVICAL CONE BIOPSY    . EVALUATION UNDER ANESTHESIA WITH HEMORRHOIDECTOMY N/A 09/03/2017   Procedure: EXAM UNDER ANESTHESIA, EXCISION OF ANAL LESION, POSSIBLE HEMORRHOIDECTOMY;  Surgeon: Ileana Roup, MD;  Location: [facility];  Service: General;  Laterality: N/A;  . THYROIDECTOMY    . TONSILLECTOMY      There were no vitals filed for this visit.  Subjective Assessment - 02/04/18 1450    Subjective  I am in a lot of pain today, especially RT lower back. Conitnues to complain of unsteadiness.     Currently in Pain?  Yes    Pain Score  9     Pain Location  Back    Pain Orientation  Right;Lower    Pain Descriptors / Indicators  Sharp;Stabbing;Constant    Aggravating Factors   Constant    Pain Relieving Factors  TENS, heat    Multiple Pain Sites  No                       [facility] Adult PT Treatment/Exercise - 02/04/18 0001      Self-Care   Self-Care  Other Self-Care  Comments    Other Self-Care Comments   Educated pt and care giver in electrode placement for her home TENS unit. PTA demo, caregiver verbally understood.       Lumbar Exercises: Aerobic   Nustep  L1 x 5 min  PTA present  to monitor status/pain.      Lumbar Exercises: Standing   Heel Raises  -- 10x, RT ankle doesn't lift as high as the LT    Other Standing Lumbar Exercises  Hip abduction bil 5x VC to slow speed      Lumbar Exercises: Seated   Long Arc Quad on Chair  -- TA contraction then LAQ 5x bil    Other Seated Lumbar Exercises  Ta contraction seated 10x      Lumbar Exercises: Supine   Other Supine Lumbar Exercises  Bil SAQ 3 sec hold 10x 0# VC to engage lower abs      Moist Heat Therapy   Number Minutes Moist Heat  15 Minutes    Moist Heat Location  Lumbar Spine      Electrical Stimulation   Electrical Stimulation Location  Rt lumbar down  anterior leg to knee Pt hooklying    Electrical Stimulation Action  IFC    Electrical Stimulation Parameters  80-150 HZ    Electrical Stimulation Goals  Pain      Manual Therapy   Manual Therapy  Manual Traction    Manual Traction  Long leg, short leg pull/traction 3x 10 sec each RTLE               PT Short Term Goals - 02/04/18 1532      PT SHORT TERM GOAL #2   Title  understand how to use home TENS unit to manage pain with caretaker able to place electodes on patient    Time  4    Period  Weeks    Status  Achieved        PT Long Term Goals - 01/29/18 1516      PT LONG TERM GOAL #1   Title  ability to perform HEP for core and LE strength with 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    Time  8    Period  Weeks    Status  New    Target Date  03/26/18      PT LONG TERM GOAL #2   Title  able to perform correct body mechanics with daily tasks to reduce strain in on her lumbar to manage her back pain    Time  8    Period  Weeks    Status  New    Target Date  03/26/18      PT LONG TERM GOAL #3   Title  sit to  stand </= 13 seconds due to improved bilateral lower extremity strength >/= 4/5 and improved balance    Time  8    Period  Weeks    Status  New    Target Date  03/26/18      PT LONG TERM GOAL #4   Title  lumbar pain with daily activities are moderate due to ability managing her pain    Time  8    Period  Weeks    Status  New    Target Date  03/26/18            Plan - 02/04/18 1449    Clinical Impression Statement  No change in pain, pain in RT lumbar more intense today per pt as well as a feeling of her legs being weak and wobbly. She did yoga this AM with her Environmental manager. We wokred on LE strength today during our session.  Pt tends to do her exercises fast, needing only cues to slow down and find control. Tried manual traction to her RTLE. This 'felt good" but did not change her pain. Her caregiver was educated in electrode placement for the home TENS unit.     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 Pt does quite a bit with yoga so she may not need many exercises from [place].     PT Home  Exercise Plan  progress as needed    Consulted and Agree with Plan of Care  Patient       Patient will benefit from skilled therapeutic intervention in order to improve the following deficits and impairments:  Abnormal gait, Pain, Increased muscle spasms, Decreased endurance, Decreased  activity tolerance, Decreased strength, Difficulty walking, Impaired flexibility  Visit Diagnosis: Acute bilateral low back pain, with sciatica presence unspecified  Muscle weakness (generalized)  Other abnormalities of gait and mobility  Dizziness and giddiness     Problem List Patient Active Problem List   Diagnosis Date Noted  . Cerebral aneurysm   . Dementia with behavioral disturbance 12/20/2016  . Aggressive behavior   . CLL (chronic lymphocytic leukemia) ([place]) 09/14/2014  . 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 HYPERTENSION, BENIGN 05/31/2010  . ATRIAL FIBRILLATION 05/31/2010    Mcgwire Dasaro, PTA 02/04/2018, 3:35 PM  [facility] [address] [place], [place], [address] Phone: [phone]   Fax:  [phone]  Name: MYSTIE ORMAND MRN: [id number] Date of Birth: [birth year]

## 2018-02-09 ENCOUNTER — Ambulatory Visit: Payer: Medicare Other | Admitting: Physical Therapy

## 2018-02-09 DIAGNOSIS — M6281 Muscle weakness (generalized): Secondary | ICD-10-CM

## 2018-02-09 DIAGNOSIS — R42 Dizziness and giddiness: Secondary | ICD-10-CM

## 2018-02-09 DIAGNOSIS — M545 Low back pain: Secondary | ICD-10-CM | POA: Diagnosis not present

## 2018-02-09 DIAGNOSIS — R2689 Other abnormalities of gait and mobility: Secondary | ICD-10-CM

## 2018-02-09 NOTE — Therapy (Signed)
[facility] [address], [place], [place], [address] Phone: [phone]   Fax:  [phone]  Physical Therapy Treatment  Patient Details  Name: Bethany Oconnor MRN: [id number] Date of Birth: 12/19/1940 Referring Provider: Dr Basil Dess   Encounter Date: 02/09/2018  PT End of Session - 02/09/18 1613    Visit Number  4    Date for PT Re-Evaluation  03/26/18    Authorization Type  BCBS    PT Start Time  1530    PT Stop Time  1615    PT Time Calculation (min)  45 min    Activity Tolerance  Patient tolerated treatment well    Behavior During Therapy  [facility] for tasks assessed/performed       Past Medical History:  Diagnosis Date  . Anxiety   . Cerebral aneurysm    s/p endovascular colling 2008  . Depression   . Hearing loss   . Macular degeneration   . Neuropathy   . Paroxysmal atrial fibrillation (HCC)   . Pneumonia    08-04-17    Past Surgical History:  Procedure Laterality Date  . ANEURYSM COILING    . CATARACT EXTRACTION     bilateral  . CERVICAL CONE BIOPSY    . EVALUATION UNDER ANESTHESIA WITH HEMORRHOIDECTOMY N/A 09/03/2017   Procedure: EXAM UNDER ANESTHESIA, EXCISION OF ANAL LESION, POSSIBLE HEMORRHOIDECTOMY;  Surgeon: Ileana Roup, MD;  Location: [facility];  Service: General;  Laterality: N/A;  . THYROIDECTOMY    . TONSILLECTOMY      There were no vitals filed for this visit.                    [facility] Adult PT Treatment/Exercise - 02/09/18 0001      Self-Care   Self-Care  Other Self-Care Comments    Other Self-Care Comments   highlighted the TENS unit set up for aides to use      Lumbar Exercises: Seated   Long Arc Quad on Chair  Strengthening;Right;Left;20 reps;Weights    LAQ on Chair Weights (lbs)  1    Other Seated Lumbar Exercises  ball squeeze hold 5 sec 15x with abdominal contraction; bilateral shoulder flexion with abdominal bracing, bil. shoulder abdution with  abdominal bracing 10x    Other Seated Lumbar Exercises  alternate hip flexion wiht abdominal contraction             PT Education - 02/09/18 1613    Education Details  highlighted TENS unit information for aides    Person(s) Educated  Patient    Methods  Explanation;Handout    Comprehension  Verbalized understanding       PT Short Term Goals - 02/04/18 1532      PT SHORT TERM GOAL #2   Title  understand how to use home TENS unit to manage pain with caretaker able to place electodes on patient    Time  4    Period  Weeks    Status  Achieved        PT Long Term Goals - 02/04/18 1653      PT LONG TERM GOAL #2   Title  able to perform correct body mechanics with daily tasks to reduce strain in on her lumbar to manage her back pain    Time  8    Period  Weeks    Status  Achieved            Plan - 02/09/18  1614    Clinical Impression Statement  Patient was a little agitated today due to having pain.  Patient felt light headed today therefore did not do standing exercises.  Patient is able to engage her core well. Patient is working with a Art gallery manager 1 on 1  for 4 days per week.  Patient fatiques easily.  Patient feels her aids are putting her home TENS unit on correctly so therapist circuled the appropriat setting for the aids to follow.  Patient will benefi tfrom skilled therapy to educatte patient on how to manage pain and core strength.     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 Pt does quite a bit with yoga so she may not need many exercises from [place].     PT Home Exercise Plan  progress as needed    Recommended Other Services  MD signed the initial evaluation    Consulted and Agree with Plan of Care  Patient       Patient will benefit from skilled therapeutic intervention in order to improve the following deficits and impairments:  Abnormal gait, Pain, Increased muscle spasms, Decreased endurance, Decreased activity tolerance, Decreased strength, Difficulty walking, Impaired flexibility  Visit Diagnosis: Acute bilateral low back pain, with sciatica presence unspecified  Muscle weakness (generalized)  Other abnormalities of gait and mobility  Dizziness and giddiness     Problem List Patient Active Problem List   Diagnosis Date Noted  . Cerebral aneurysm   . Dementia with behavioral disturbance 12/20/2016  . Aggressive behavior   . CLL (chronic lymphocytic leukemia) ([place]) 09/14/2014  . 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 HYPERTENSION, BENIGN 05/31/2010  . ATRIAL FIBRILLATION 05/31/2010    Earlie Counts, PT 02/09/18 5:08 PM   [facility] [address] [place], [place], [address] Phone: [phone]   Fax:  [phone]  Name: Bethany Oconnor MRN: [id number] Date of Birth: Sep 17, 1940

## 2018-02-12 ENCOUNTER — Encounter: Payer: Self-pay | Admitting: Physical Therapy

## 2018-02-12 ENCOUNTER — Ambulatory Visit: Payer: Medicare Other | Admitting: Physical Therapy

## 2018-02-12 DIAGNOSIS — R2689 Other abnormalities of gait and mobility: Secondary | ICD-10-CM

## 2018-02-12 DIAGNOSIS — M6281 Muscle weakness (generalized): Secondary | ICD-10-CM

## 2018-02-12 DIAGNOSIS — M545 Low back pain: Secondary | ICD-10-CM | POA: Diagnosis not present

## 2018-02-12 NOTE — Therapy (Signed)
[facility] [address] [place], [place], [address] Phone: [phone]   Fax:  [phone]  Physical Therapy Treatment  Patient Details  Name: Bethany Oconnor MRN: [id number] Date of Birth: 1941/01/07 Referring Provider: Dr Basil Dess   Encounter Date: 02/12/2018  PT End of Session - 02/12/18 1022    Visit Number  5    Date for PT Re-Evaluation  03/26/18    Authorization Type  BCBS    PT Start Time  1015    PT Stop Time  1115    PT Time Calculation (min)  60 min    Activity Tolerance  Patient tolerated treatment well    Behavior During Therapy  [facility] for tasks assessed/performed       Past Medical History:  Diagnosis Date  . Anxiety   . Cerebral aneurysm    s/p endovascular colling 2008  . Depression   . Hearing loss   . Macular degeneration   . Neuropathy   . Paroxysmal atrial fibrillation (HCC)   . Pneumonia    08-04-17    Past Surgical History:  Procedure Laterality Date  . ANEURYSM COILING    . CATARACT EXTRACTION     bilateral  . CERVICAL CONE BIOPSY    . EVALUATION UNDER ANESTHESIA WITH HEMORRHOIDECTOMY N/A 09/03/2017   Procedure: EXAM UNDER ANESTHESIA, EXCISION OF ANAL LESION, POSSIBLE HEMORRHOIDECTOMY;  Surgeon: Ileana Roup, MD;  Location: [facility];  Service: General;  Laterality: N/A;  . THYROIDECTOMY    . TONSILLECTOMY      There were no vitals filed for this visit.  Subjective Assessment - 02/12/18 1015    Subjective  I am in so much pain that I feel like I am going to throw up. Tuesday I fell on the concrete on my left side. My legs feel like I am giving way. Numbness and tingling in both legs. I have neuropathy too. I saw the doctor after the fall and no broken bones just hematoma. I have depression today.     Patient Stated Goals  reduce pain, increase strength of back and legs    Currently in Pain?  Yes    Pain Score  10-Worst pain ever    Pain Location  Back    Pain  Orientation  Lower    Pain Descriptors / Indicators  Constant;Sharp;Stabbing    Pain Onset  More than a month ago    Pain Frequency  Constant    Aggravating Factors   constant    Pain Relieving Factors  Tens, heat    Multiple Pain Sites  No                       [facility] Adult PT Treatment/Exercise - 02/12/18 0001      Transfers   Transfers  Supine to Sit;Sit to Supine    Supine to Sit  4: Min assist    Supine to Sit Details (indicate cue type and reason)  had to use tactile cues to tighten abdominals, log roll    Supine to Sit Details  Tactile cues for initiation;Verbal cues for sequencing    Sit to Supine  4: Min assist    Sit to Supine Details (indicate cue type and reason)  Tactile cues for initiation;Verbal cues for sequencing    Comments  transfer into car, walk to the commode , transfer to the commode      Lumbar Exercises: Aerobic   Nustep  L1 for 2 min. had to stop due to pain PT present to monitor patient      Lumbar Exercises: Supine   Ab Set  10 reps;2 seconds VC on breathing    Isometric Hip Flexion  5 reps;5 seconds keep feet on mat    Other Supine Lumbar Exercises  hookly press hands on mat for 5 sec to contract lower abdominals 10x      Modalities   Modalities  Electrical Stimulation;Moist Heat      Moist Heat Therapy   Number Minutes Moist Heat  20 Minutes    Moist Heat Location  Lumbar Spine      Electrical Stimulation   Electrical Stimulation Location  Rt lumbar down anterior leg to knee Pt hooklying    Electrical Stimulation Action  IFC    Electrical Stimulation Parameters  to patient tolerance, 20 minutes    Electrical Stimulation Goals  Pain             PT Education - 02/12/18 1123    Education Details  instructed patient to call doctor due to her legs giving way and to look into a walker with seat and brakes    Person(s) Educated  Patient    Methods  Explanation    Comprehension  Verbalized understanding       PT Short Term  Goals - 02/12/18 1127      PT SHORT TERM GOAL #1   Title  independent with initial HEP for stretching her hamstrings and lumbar flexion exercises    Baseline  still learning, 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  Patient is very depressed in therapy.  Patient needed to use a walk to go to the car due to safety and her legs giving way.  Patient needed therapist with CG and min assist for transfer and going into the bathroom.  Patient has difficulty with abdominal bracing.  Patient will benefi tfrom skilled therapy to educate patient on how to manage pain and strength.     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 work on transfer with core bracing, work on Engineer, water; modalities for pain    PT Home Exercise  Plan  progress as needed    Consulted and Agree with Plan of Care  Patient       Patient will benefit from skilled therapeutic intervention in order to improve the following deficits and impairments:  Abnormal gait, Pain, Increased muscle spasms, Decreased endurance, Decreased activity tolerance, Decreased strength, Difficulty walking, Impaired flexibility  Visit Diagnosis: Acute bilateral low back pain, with sciatica presence unspecified  Muscle weakness (generalized)  Other abnormalities of gait and mobility     Problem List Patient Active Problem List   Diagnosis Date Noted  . Cerebral aneurysm   . Dementia with behavioral disturbance 12/20/2016  . Aggressive behavior   . CLL (chronic lymphocytic leukemia) ([place]) 09/14/2014  . 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 HYPERTENSION, BENIGN 05/31/2010  . ATRIAL FIBRILLATION 05/31/2010    Earlie Counts, PT 02/12/18 11:28 AM    [facility] [address] [place], [place], [address] Phone: [phone]   Fax:  [phone]  Name: Bethany Oconnor MRN: [id number] Date of Birth: [birth year]

## 2018-02-16 ENCOUNTER — Ambulatory Visit (INDEPENDENT_AMBULATORY_CARE_PROVIDER_SITE_OTHER): Payer: Medicare Other | Admitting: Cardiovascular Disease

## 2018-02-16 ENCOUNTER — Encounter: Payer: Self-pay | Admitting: Cardiovascular Disease

## 2018-02-16 ENCOUNTER — Encounter: Payer: Self-pay | Admitting: Physical Therapy

## 2018-02-16 ENCOUNTER — Ambulatory Visit: Payer: Medicare Other | Admitting: Physical Therapy

## 2018-02-16 VITALS — BP 118/70 | HR 75 | Ht 65.0 in | Wt 168.0 lb

## 2018-02-16 DIAGNOSIS — R2689 Other abnormalities of gait and mobility: Secondary | ICD-10-CM

## 2018-02-16 DIAGNOSIS — I480 Paroxysmal atrial fibrillation: Secondary | ICD-10-CM | POA: Diagnosis not present

## 2018-02-16 DIAGNOSIS — M545 Low back pain: Secondary | ICD-10-CM | POA: Diagnosis not present

## 2018-02-16 DIAGNOSIS — M6281 Muscle weakness (generalized): Secondary | ICD-10-CM

## 2018-02-16 DIAGNOSIS — R42 Dizziness and giddiness: Secondary | ICD-10-CM

## 2018-02-16 NOTE — Progress Notes (Signed)
Cardiology Office Note Date:  02/16/2018   ID:  ALMA MOHIUDDIN, DOB 1941-04-09, MRN [id number]  PCP:  Crist Infante, MD  Cardiologist:  Sherren Mocha, MD    Chief Complaint  Patient presents with  . Atrial Fibrillation     History of Present Illness: Bethany Oconnor is a 77 y.o. female who presents for follow-up evaluation.  She been followed for paroxysmal atrial fibrillation, hypertension, and a small atrial septal defect.  She is here with her caregiver today.  She was just seen last month but returns to discuss anticoagulation.  She reports no new cardiac symptoms.  She specifically denies chest pain or shortness of breath.  She did have a fall last week when she fell backward trying to sit down.  She did not have any major injury but did have some bruising in her leg.  She denies lightheadedness or syncope.  She continues to have a lot of limitation related to back problems and has been taking ibuprofen with no relief.  She returns to discuss whether she can stop anticoagulation.  Past Medical History:  Diagnosis Date  . Anxiety   . Cerebral aneurysm    s/p endovascular colling 2008  . Depression   . Hearing loss   . Macular degeneration   . Neuropathy   . Paroxysmal atrial fibrillation (HCC)   . Pneumonia    08-04-17    Past Surgical History:  Procedure Laterality Date  . ANEURYSM COILING    . CATARACT EXTRACTION     bilateral  . CERVICAL CONE BIOPSY    . EVALUATION UNDER ANESTHESIA WITH HEMORRHOIDECTOMY N/A 09/03/2017   Procedure: EXAM UNDER ANESTHESIA, EXCISION OF ANAL LESION, POSSIBLE HEMORRHOIDECTOMY;  Surgeon: Ileana Roup, MD;  Location: [facility];  Service: General;  Laterality: N/A;  . THYROIDECTOMY    . TONSILLECTOMY      Current Outpatient Medications  Medication Sig Dispense Refill  . buPROPion (WELLBUTRIN XL) 300 MG 24 hr tablet Take 1 tablet by mouth daily.    . Calcium Carbonate-Vitamin D 600-400 MG-UNIT per tablet Take 1 tablet by mouth  daily.    . COMBIGAN 0.2-0.5 % ophthalmic solution Apply 2 drops to eye 2 (two) times daily. Left Eye    . diltiazem (CARDIZEM CD) 180 MG 24 hr capsule Take 180 mg by mouth daily.    Marland Kitchen donepezil (ARICEPT) 5 MG tablet Take 5 mg by mouth at bedtime.    . gabapentin (NEURONTIN) 100 MG capsule Take 1 capsule (100 mg total) by mouth 2 (two) times daily for 3 days. 60 capsule 0  . ibuprofen (ADVIL,MOTRIN) 400 MG tablet Take 1 tablet (400 mg total) by mouth every 8 (eight) hours as needed for moderate pain. 60 tablet 0  . lamoTRIgine (LAMICTAL) 100 MG tablet Take 0.5 tablets by mouth daily.    Marland Kitchen latanoprost (XALATAN) 0.005 % ophthalmic solution Place 2 drops into the left eye 2 (two) times daily.    Marland Kitchen LORazepam (ATIVAN) 0.5 MG tablet Take 0.5 mg by mouth every 8 (eight) hours as needed for anxiety.    . Melatonin 5 MG TABS Take 5 mg by mouth at bedtime.    . memantine (NAMENDA) 5 MG tablet Take 5 mg by mouth daily.    . Multiple Vitamins-Minerals (MULTIVITAMINS THER. W/MINERALS) TABS tablet Take 1 tablet by mouth daily.    . Multiple Vitamins-Minerals (PRESERVISION AREDS) CAPS Take 1 capsule daily by mouth.    . pravastatin (PRAVACHOL) 40 MG tablet Take 40  mg by mouth daily.     Marland Kitchen PREMPRO 0.625-5 MG per tablet Take 1 tablet by mouth daily.   3  . Probiotic Product (ALIGN) 4 MG CAPS Take 1 capsule by mouth daily.     Current Facility-Administered Medications  Medication Dose Route Frequency Provider Last Rate Last Dose  . oxyCODONE (OXYCONTIN) 12 hr tablet 10 mg  10 mg Oral Q12H Jessy Oto, MD        Allergies:   Ambien [zolpidem tartrate]; Fioricet-codeine [butalbital-apap-caff-cod]; Macrobid [nitrofurantoin]; Codeine; and Keflex [cephalexin]   Social History:  The patient  reports that she quit smoking about 29 years ago. She has never used smokeless tobacco. She reports that she does not drink alcohol or use drugs.   Family History:  The patient's family history includes Cancer (age of  onset: 71) in her father; Cerebral aneurysm (age of onset: 9) in her mother; Heart attack in her brother.    ROS:  Please see the history of present illness.  Otherwise, review of systems is positive for dizziness, easy bruising.  All other systems are reviewed and negative.    PHYSICAL EXAM: VS:  BP 118/70   Pulse 75   Ht 5\' 5"  (1.651 m)   Wt 168 lb (76.2 kg)   SpO2 94%   BMI 27.96 kg/m  , BMI Body mass index is 27.96 kg/m. GEN: Well nourished, well developed, pleasant elderly woman in no acute distress  HEENT: normal  Neck: no JVD, no masses. No carotid bruits Cardiac: RRR without murmur or gallop                Respiratory:  clear to auscultation bilaterally, normal work of breathing GI: soft, nontender, nondistended, + BS MS: no deformity or atrophy  Ext: no pretibial edema, pedal pulses 2+= bilaterally Skin: warm and dry, no rash Neuro:  Strength and sensation are intact Psych: euthymic mood, full affect  EKG:  EKG is not ordered today.  Recent Labs: 10/24/2017: ALT 15; BUN 9; Creatinine, Ser 0.83; Hemoglobin 12.9; Platelets 193; Potassium 4.0; Sodium 140   Lipid Panel     Component Value Date/Time   CHOL 148 12/21/2016   TRIG 165 (A) 12/21/2016   HDL 39 12/21/2016   LDLCALC 76 12/21/2016      Wt Readings from Last 3 Encounters:  02/16/18 168 lb (76.2 kg)  01/22/18 166 lb 8 oz (75.5 kg)  01/12/18 170 lb 6.4 oz (77.3 kg)     Cardiac Studies Reviewed: Echo 04-17-2016: Study Conclusions  - Left ventricle: The cavity size was normal. There was mild   concentric hypertrophy. Systolic function was normal. The   estimated ejection fraction was in the range of 50% to 55%. Wall   motion was normal; there were no regional wall motion   abnormalities. Doppler parameters are consistent with abnormal   left ventricular relaxation (grade 1 diastolic dysfunction).   There was no evidence of elevated ventricular filling pressure by   Doppler parameters. - Aortic  valve: Trileaflet; normal thickness leaflets. There was   mild regurgitation. - Aortic root: The aortic root was normal in size. - Ascending aorta: The ascending aorta was normal in size. - Mitral valve: Structurally normal valve. There was trivial   regurgitation. - Left atrium: The atrium was normal in size. - Right ventricle: The cavity size was normal. Wall thickness was   normal. Systolic function was normal. - Right atrium: The atrium was normal in size. - Tricuspid valve: There was trivial  regurgitation. - Pulmonic valve: There was no regurgitation. - Pulmonary arteries: Systolic pressure was within the normal   range. - Inferior vena cava: The vessel was normal in size. - Pericardium, extracardiac: There was no pericardial effusion.  ASSESSMENT AND PLAN: 1.  Paroxysmal atrial fibrillation: CHADS-Vasc = 4.  We discussed the pros and cons of oral anticoagulation at length.  Considering her recurrent back problems, intermittent need for procedures such as spinal injections, use of nonsteroidal anti-inflammatories, and increasing fall risk in the setting of deteriorating vision and gait instability, I think the risk of continuing anticoagulation with dabigatran probably outweighs the benefit.  In addition, I have not seen any recurrence of atrial fibrillation in several years during her semiannual exams.  We will stop dabigatran at this point and continue with clinical follow-up.  2.  Hypertension: Blood pressure remains well controlled.  Current medicines are reviewed with the patient today.  The patient does not have concerns regarding medicines.  Labs/ tests ordered today include:  No orders of the defined types were placed in this encounter.   Disposition:   FU 6 months  Signed, Sherren Mocha, MD  02/16/2018 2:27 PM    [facility] [place], [place], [place]  [address] Phone: [phone]; Fax: [phone]

## 2018-02-16 NOTE — Therapy (Signed)
[facility] [address] [place], [place], [address] Phone: [phone]   Fax:  [phone]  Physical Therapy Treatment  Patient Details  Name: Bethany Oconnor MRN: [id number] Date of Birth: 09/26/1940 Referring Provider: Dr Basil Dess   Encounter Date: 02/16/2018  PT End of Session - 02/16/18 1536    Visit Number  6    Date for PT Re-Evaluation  03/26/18    Authorization Type  BCBS    PT Start Time  1533    PT Stop Time  1608    PT Time Calculation (min)  35 min    Activity Tolerance  Patient tolerated treatment well    Behavior During Therapy  [facility] for tasks assessed/performed       Past Medical History:  Diagnosis Date  . Anxiety   . Cerebral aneurysm    s/p endovascular colling 2008  . Depression   . Hearing loss   . Macular degeneration   . Neuropathy   . Paroxysmal atrial fibrillation (HCC)   . Pneumonia    08-04-17    Past Surgical History:  Procedure Laterality Date  . ANEURYSM COILING    . CATARACT EXTRACTION     bilateral  . CERVICAL CONE BIOPSY    . EVALUATION UNDER ANESTHESIA WITH HEMORRHOIDECTOMY N/A 09/03/2017   Procedure: EXAM UNDER ANESTHESIA, EXCISION OF ANAL LESION, POSSIBLE HEMORRHOIDECTOMY;  Surgeon: Ileana Roup, MD;  Location: [facility];  Service: General;  Laterality: N/A;  . THYROIDECTOMY    . TONSILLECTOMY      There were no vitals filed for this visit.  Subjective Assessment - 02/16/18 1537    Subjective  Severe pain in back pt reports. Continues with numbness and tingling in both legs.    Currently in Pain?  Yes    Pain Score  10-Worst pain ever    Pain Location  Back    Pain Orientation  Lower    Pain Descriptors / Indicators  Constant;Sharp;Numbness;Tingling    Multiple Pain Sites  No                       [facility] Adult PT Treatment/Exercise - 02/16/18 0001      Lumbar Exercises: Stretches   Active Hamstring Stretch  Right;Left;2 reps;10  seconds verbal cuing on technique      Lumbar Exercises: Aerobic   Nustep  L1 x 5 min PTA present for monitoring pain.      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 She reports doing an hour of yoga this morning. She did not need any assistance with transfers today and got on/off the Nustep without and guarded movements. Becasue of this we proceeded with theraputic exercises to strengthen her legs, hips and core.  She reports she has difficulty feeling her core muscles. She was advised to get her aide to put the TENS unit on when they got home after todays session.     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    PT Frequency  2x / week    PT Duration  8 weeks    PT Treatment/Interventions  Cryotherapy;Electrical Stimulation;Other (comment);Moist Heat;Ultrasound;Traction;Therapeutic exercise;Balance training;Therapeutic activities;Gait training;Neuromuscular re-education;Patient/family education;Manual techniques;Dry needling;Energy conservation    PT Next Visit Plan  LE strength., work on endurance and strength; modalities for pain    PT Home Exercise Plan  progress as needed    Consulted and Agree with Plan of Care  Patient       Patient will benefit from skilled therapeutic intervention in order to improve the following deficits and impairments:  Abnormal gait, Pain, Increased muscle spasms, Decreased endurance, Decreased activity tolerance, Decreased strength, Difficulty walking, Impaired flexibility  Visit Diagnosis: Acute bilateral low back pain, with sciatica presence unspecified  Muscle weakness (generalized)  Other abnormalities of gait and mobility  Dizziness and giddiness     Problem List Patient Active Problem List   Diagnosis Date Noted  . Cerebral aneurysm   . Dementia with behavioral disturbance 12/20/2016  . Aggressive behavior   . CLL (chronic lymphocytic leukemia) ([place]) 09/14/2014  . 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 HYPERTENSION, BENIGN 05/31/2010  . ATRIAL FIBRILLATION 05/31/2010    Elka Satterfield, PTA 02/16/2018, 4:13 PM  [facility] [address] [place], [place], [address] Phone: [phone]   Fax:  [phone]  Name: Bethany Oconnor MRN: [id number] Date of Birth: Jun 12, 1941

## 2018-02-16 NOTE — Patient Instructions (Signed)
Medication Instructions:  1) STOP PRADAXA  Labwork: None  Testing/Procedures: None  Follow-Up: Your provider wants you to follow-up in: 6 months with Dr. Burt Knack. You will receive a reminder letter in the mail two months in advance. If you don't receive a letter, please call our office to schedule the follow-up appointment.    Any Other Special Instructions Will Be Listed Below (If Applicable).     If you need a refill on your cardiac medications before your next appointment, please call your pharmacy.

## 2018-02-18 ENCOUNTER — Ambulatory Visit: Payer: Medicare Other | Admitting: Physical Therapy

## 2018-02-18 ENCOUNTER — Encounter: Payer: Self-pay | Admitting: Physical Therapy

## 2018-02-18 DIAGNOSIS — M545 Low back pain: Secondary | ICD-10-CM | POA: Diagnosis not present

## 2018-02-18 DIAGNOSIS — R42 Dizziness and giddiness: Secondary | ICD-10-CM

## 2018-02-18 DIAGNOSIS — R2689 Other abnormalities of gait and mobility: Secondary | ICD-10-CM

## 2018-02-18 DIAGNOSIS — M6281 Muscle weakness (generalized): Secondary | ICD-10-CM

## 2018-02-18 NOTE — Therapy (Signed)
[facility] [address] [place], [place], [address] Phone: [phone]   Fax:  [phone]  Physical Therapy Treatment  Patient Details  Name: Bethany Oconnor MRN: [id number] Date of Birth: February 09, 1941 Referring Provider: Dr Basil Dess   Encounter Date: 02/18/2018  PT End of Session - 02/18/18 1602    Visit Number  7    Date for PT Re-Evaluation  03/26/18    Authorization Type  BCBS    PT Start Time  3710    PT Stop Time  1614    PT Time Calculation (min)  43 min    Activity Tolerance  Patient tolerated treatment well    Behavior During Therapy  [facility] for tasks assessed/performed       Past Medical History:  Diagnosis Date  . Anxiety   . Cerebral aneurysm    s/p endovascular colling 2008  . Depression   . Hearing loss   . Macular degeneration   . Neuropathy   . Paroxysmal atrial fibrillation (HCC)   . Pneumonia    08-04-17    Past Surgical History:  Procedure Laterality Date  . ANEURYSM COILING    . CATARACT EXTRACTION     bilateral  . CERVICAL CONE BIOPSY    . EVALUATION UNDER ANESTHESIA WITH HEMORRHOIDECTOMY N/A 09/03/2017   Procedure: EXAM UNDER ANESTHESIA, EXCISION OF ANAL LESION, POSSIBLE HEMORRHOIDECTOMY;  Surgeon: Ileana Roup, MD;  Location: [facility];  Service: General;  Laterality: N/A;  . THYROIDECTOMY    . TONSILLECTOMY      There were no vitals filed for this visit.  Subjective Assessment - 02/18/18 1544    Subjective  Same, did yoga this AM, I am wore out.    Pain Score  9     Pain Location  Back    Pain Orientation  Lower    Pain Descriptors / Indicators  Constant;Sharp;Heaviness;Tingling    Multiple Pain Sites  No                       [facility] Adult PT Treatment/Exercise - 02/18/18 0001      Lumbar Exercises: Stretches   Active Hamstring Stretch  Right;Left;2 reps;10 seconds verbal cuing on technique    Standing Side Bend  -- Seated side bend stretch 3x  bil       Lumbar Exercises: Aerobic   Nustep  L2 x 6 min      Lumbar Exercises: Seated   Long Arc Quad on Chair  Strengthening;Both;2 sets;10 reps;Weights    LAQ on Chair Weights (lbs)  2    Long [employer] on [place]  -- Bicep curls 2# 10x with ab bracing    Hip Flexion on Ball  Strengthening;Both;20 reps 2#    Other Seated Lumbar Exercises  green band hip abduction 2x10    Other Seated Lumbar Exercises  20x ball squeeze      Lumbar Exercises: Supine   Ab Set  10 reps;2 seconds VC on breathing, did seated               PT Short Term Goals - 02/12/18 1127      PT SHORT TERM GOAL #1   Title  independent with initial HEP for stretching her hamstrings and lumbar flexion exercises    Baseline  still learning, 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    Time  4    Period  Weeks    Status  On-going  PT SHORT TERM GOAL #2   Title  understand how to use home TENS unit to manage pain with caretaker able to place electodes on patient    Time  4    Period  Weeks    Status  Achieved        PT Long Term Goals - 02/04/18 1653      PT LONG TERM GOAL #2   Title  able to perform correct body mechanics with daily tasks to reduce strain in on her lumbar to manage her back pain    Time  8    Period  Weeks    Status  Achieved            Plan - 02/18/18 1602    Clinical Impression Statement  Pain unchanged, remains with complaints of dizziness she feels is related to her macular. She continues to do yoga 2x week for 1- 1 1/2 hours. She reports this is a very challenging workout. We have focused today on her bilateral leg strength and endurance. We increased her resistance on her LE today which she tolerated well, definite fatigue.      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    PT Frequency  2x / week     PT Duration  8 weeks    PT Treatment/Interventions  Cryotherapy;Electrical Stimulation;Other (comment);Moist Heat;Ultrasound;Traction;Therapeutic exercise;Balance training;Therapeutic activities;Gait training;Neuromuscular re-education;Patient/family education;Manual techniques;Dry needling;Energy conservation    PT Next Visit Plan  LE strength., work on endurance and strength; modalities for pain    Consulted and Agree with Plan of Care  Patient       Patient will benefit from skilled therapeutic intervention in order to improve the following deficits and impairments:  Abnormal gait, Pain, Increased muscle spasms, Decreased endurance, Decreased activity tolerance, Decreased strength, Difficulty walking, Impaired flexibility  Visit Diagnosis: Acute bilateral low back pain, with sciatica presence unspecified  Muscle weakness (generalized)  Other abnormalities of gait and mobility  Dizziness and giddiness     Problem List Patient Active Problem List   Diagnosis Date Noted  . Cerebral aneurysm   . Dementia with behavioral disturbance 12/20/2016  . Aggressive behavior   . CLL (chronic lymphocytic leukemia) ([place]) 09/14/2014  . 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 HYPERTENSION, BENIGN 05/31/2010  . ATRIAL FIBRILLATION 05/31/2010    Linzie Boursiquot, PTA 02/18/2018, 4:16 PM  [facility] [address], [place], [place], [address] Phone: [phone]   Fax:  [phone]  Name: Bethany Oconnor MRN: [id number] Date of Birth: [birth year]

## 2018-02-19 ENCOUNTER — Other Ambulatory Visit (INDEPENDENT_AMBULATORY_CARE_PROVIDER_SITE_OTHER): Payer: Self-pay | Admitting: Radiology

## 2018-02-20 MED ORDER — IBUPROFEN 400 MG PO TABS: 400.0000 mg | ORAL_TABLET | Freq: Three times a day (TID) | ORAL | 0 refills | Status: DC | PRN

## 2018-02-23 ENCOUNTER — Encounter: Payer: Self-pay | Admitting: Physical Therapy

## 2018-02-24 LAB — BASIC METABOLIC PANEL
BUN: 9 (ref 4–21)
Creatinine: 0.9 (ref 0.5–1.1)
Glucose: 114
POTASSIUM: 4 (ref 3.4–5.3)
SODIUM: 140 (ref 137–147)

## 2018-02-25 ENCOUNTER — Encounter: Payer: Self-pay | Admitting: Physical Therapy

## 2018-02-25 ENCOUNTER — Ambulatory Visit: Payer: Medicare Other | Admitting: Physical Therapy

## 2018-02-25 DIAGNOSIS — R42 Dizziness and giddiness: Secondary | ICD-10-CM

## 2018-02-25 DIAGNOSIS — M6281 Muscle weakness (generalized): Secondary | ICD-10-CM

## 2018-02-25 DIAGNOSIS — M545 Low back pain: Secondary | ICD-10-CM

## 2018-02-25 DIAGNOSIS — R2689 Other abnormalities of gait and mobility: Secondary | ICD-10-CM

## 2018-02-25 NOTE — Therapy (Signed)
[facility] [address] [place], [place], [address] Phone: [phone]   Fax:  [phone]  Physical Therapy Treatment  Patient Details  Name: Bethany Oconnor MRN: [id number] Date of Birth: 1941-01-19 Referring Provider: Dr Basil Dess   Encounter Date: 02/25/2018  PT End of Session - 02/25/18 1534    Visit Number  8    Date for PT Re-Evaluation  03/26/18    Authorization Type  BCBS    PT Start Time  1530    PT Stop Time  1610    PT Time Calculation (min)  40 min    Activity Tolerance  Patient tolerated treatment well    Behavior During Therapy  [facility] for tasks assessed/performed       Past Medical History:  Diagnosis Date  . Anxiety   . Cerebral aneurysm    s/p endovascular colling 2008  . Depression   . Hearing loss   . Macular degeneration   . Neuropathy   . Paroxysmal atrial fibrillation (HCC)   . Pneumonia    08-04-17    Past Surgical History:  Procedure Laterality Date  . ANEURYSM COILING    . CATARACT EXTRACTION     bilateral  . CERVICAL CONE BIOPSY    . EVALUATION UNDER ANESTHESIA WITH HEMORRHOIDECTOMY N/A 09/03/2017   Procedure: EXAM UNDER ANESTHESIA, EXCISION OF ANAL LESION, POSSIBLE HEMORRHOIDECTOMY;  Surgeon: Ileana Roup, MD;  Location: [facility];  Service: General;  Laterality: N/A;  . THYROIDECTOMY    . TONSILLECTOMY      There were no vitals filed for this visit.  Subjective Assessment - 02/25/18 1536    Subjective  Did some less intense yoga this AM to save my energy for this afternoon.     Currently in Pain?  Yes    Pain Score  8     Pain Location  Back    Pain Orientation  Lower    Pain Descriptors / Indicators  Constant;Sharp;Heaviness;Tingling    Aggravating Factors   constant    Multiple Pain Sites  No                       [facility] Adult PT Treatment/Exercise - 02/25/18 0001      Lumbar Exercises: Aerobic   Nustep  L2 x 10 min PTA present to monitor  pt      Lumbar Exercises: Standing   Heel Raises  20 reps VC to slow speed    Other Standing Lumbar Exercises  Hip abduction bil 10x2 VC to slow speed, 2# added    Other Standing Lumbar Exercises  Knee flexion bil 20x 2#       Lumbar Exercises: Seated   Long Arc Quad on Chair  Strengthening;Both;2 sets;20 reps;Weights    LAQ on Chair Weights (lbs)  2    Hip Flexion on Ball  Strengthening;Both;20 reps 2#, VC to engage lower abs    Sit to Stand  10 reps    Other Seated Lumbar Exercises  green band hip abduction 2x10    Other Seated Lumbar Exercises  20x ball squeeze, VC for posture and engaging core while she sits unsupported.  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    Time  4    Period  Weeks    Status  On-going      PT SHORT TERM GOAL #2   Title  understand how to use home TENS unit to manage pain with caretaker able to place electodes on patient    Time  4    Period  Weeks    Status  Achieved        PT Long Term Goals - 02/04/18 1653      PT LONG TERM GOAL #2   Title  able to perform correct body mechanics with daily tasks to reduce strain in on her lumbar to manage her back pain    Time  8    Period  Weeks    Status  Achieved            Plan - 02/25/18 1535    Clinical Impression Statement  Pt was able to tolerate 2# with her resistive exercises today for her LE strengthening. She was able to perform more standing exercises. This included sit to stand without UE assistance. Pain is reporteedly unchanged per pt.     Rehab Potential  Good    Clinical Impairments Affecting Rehab Potential  Avoid bending, stopping, lifting > 10#, frequent bending and stooping; prolonged standing and walking; no lifting > 10 #; blind;  hears best in right ear    PT Frequency  2x / week    PT Duration  8 weeks    PT Treatment/Interventions  Cryotherapy;Electrical Stimulation;Other (comment);Moist Heat;Ultrasound;Traction;Therapeutic exercise;Balance training;Therapeutic activities;Gait training;Neuromuscular re-education;Patient/family education;Manual techniques;Dry needling;Energy conservation    PT Next Visit Plan  MMT bil hips, knees, ankles, review goals     Consulted and Agree with Plan of Care  Patient       Patient will benefit from skilled therapeutic intervention in order to improve the following deficits and impairments:  Abnormal gait, Pain, Increased muscle spasms, Decreased endurance, Decreased activity tolerance, Decreased strength, Difficulty walking, Impaired flexibility  Visit Diagnosis: Acute bilateral low back pain, with sciatica presence unspecified  Muscle weakness (generalized)  Other abnormalities of gait and mobility  Dizziness and giddiness     Problem List Patient Active Problem List   Diagnosis Date Noted  . Cerebral aneurysm   . Dementia with behavioral disturbance 12/20/2016  . Aggressive behavior   . CLL (chronic lymphocytic leukemia) ([place]) 09/14/2014  . 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 HYPERTENSION, BENIGN 05/31/2010  . ATRIAL FIBRILLATION 05/31/2010    Seleta Hovland, PTA 02/25/2018, 5:04 PM  [facility] [address], [place], [place], [address] Phone: [phone]   Fax:  [phone]  Name: Bethany Oconnor MRN: [id number] Date of Birth: 1941-05-31

## 2018-02-27 ENCOUNTER — Encounter: Payer: Self-pay | Admitting: Physical Therapy

## 2018-02-27 ENCOUNTER — Ambulatory Visit: Payer: Medicare Other | Admitting: Physical Therapy

## 2018-02-27 DIAGNOSIS — M6281 Muscle weakness (generalized): Secondary | ICD-10-CM

## 2018-02-27 DIAGNOSIS — M545 Low back pain: Secondary | ICD-10-CM

## 2018-02-27 DIAGNOSIS — R2689 Other abnormalities of gait and mobility: Secondary | ICD-10-CM

## 2018-02-27 NOTE — Therapy (Signed)
[facility] [address] [place], [place], [address] Phone: [phone]   Fax:  [phone]  Progress Note Reporting Period  01/29/2018 to 02/27/2018  See note below for Objective Data and Assessment of Progress/Goals.         Physical Therapy Treatment  Patient Details  Name: Bethany Oconnor MRN: [id number] Date of Birth: [birth year] Referring Provider: Dr. Basil Dess   Encounter Date: 02/27/2018  PT End of Session - 02/27/18 0925    Visit Number  9    Date for PT Re-Evaluation  03/26/18    Authorization Type  [employer], 10th visit note done on the 9th visit    PT Start Time  0930    PT Stop Time  1010    PT Time Calculation (min)  40 min    Activity Tolerance  Patient tolerated treatment well    Behavior During Therapy  [facility] for tasks assessed/performed       Past Medical History:  Diagnosis Date  . Anxiety   . Cerebral aneurysm    s/p endovascular colling 2008  . Depression   . Hearing loss   . Macular degeneration   . Neuropathy   . Paroxysmal atrial fibrillation (HCC)   . Pneumonia    08-04-17    Past Surgical History:  Procedure Laterality Date  . ANEURYSM COILING    . CATARACT EXTRACTION     bilateral  . CERVICAL CONE BIOPSY    . EVALUATION UNDER ANESTHESIA WITH HEMORRHOIDECTOMY N/A 09/03/2017   Procedure: EXAM UNDER ANESTHESIA, EXCISION OF ANAL LESION, POSSIBLE HEMORRHOIDECTOMY;  Surgeon: Ileana Roup, MD;  Location: [facility];  Service: General;  Laterality: N/A;  . THYROIDECTOMY    . TONSILLECTOMY      There were no vitals filed for this visit.  Subjective Assessment - 02/27/18 0929    Subjective  I have been working with Juliann Pulse four days per week for yoga and 1 day with weights.     Patient Stated Goals  reduce pain, increase strength of back and legs    Currently in Pain?  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 Basil Dess    Onset Date/Surgical Date  10/30/17    Prior Therapy  None      Precautions   Precautions  Other (comment);Back    Precaution Comments  Blind; Avoid bending, stooping, lifting weigh > 10#, prolonged standing, and walking      Restrictions   Weight Bearing Restrictions  No      Home Environment   Living Environment  Assisted living    Living Arrangements  Alone      Prior Function   Level of Independence  Independent with basic ADLs    Vocation  Retired    Leisure  yoga      Cognition   Overall  Cognitive Status  Within Functional Limits for tasks assessed      Sensation   Light Touch  Appears Intact bil. lower extremities      Posture/Postural Control   Posture/Postural Control  No significant limitations      AROM   Overall AROM Comments  lumbar ROM is limited due to doctors orders      Strength   Right Hip Flexion  4+/5    Right Hip ABduction  4/5    Left Hip Flexion  4+/5    Left Hip ABduction  4/5    Right Knee Flexion  5/5    Right Knee Extension  5/5    Left Knee Flexion  5/5    Left Knee Extension  5/5    Right Ankle Plantar Flexion  3/5    Right Ankle Eversion  5/5    Left Ankle Plantar Flexion  3/5    Left Ankle Eversion  5/5      Transfers   Transfers  Independent with all Transfers needs help due to being blind                   [facility] Adult PT Treatment/Exercise - 02/27/18 0001      Lumbar Exercises: Stretches   Active Hamstring Stretch  Right;Left;2 reps;10 seconds verbal cuing on technique      Lumbar  Exercises: Standing   Heel Raises  20 reps 10 right and left holding on    Other Standing Lumbar Exercises  Knee flexion bil 20x 2#       Lumbar Exercises: Seated   Long Arc Quad on Chair  Strengthening;Both;2 sets;20 reps;Weights    LAQ on Chair Weights (lbs)  2               PT Short Term Goals - 02/27/18 0959      PT SHORT TERM GOAL #1   Title  independent with initial HEP for stretching her hamstrings and lumbar flexion exercises    Time  4    Period  Weeks    Status  Achieved      PT SHORT TERM GOAL #2   Title  understand how to use home TENS unit to manage pain with caretaker able to place electodes on patient    Time  4    Period  Weeks    Status  Achieved        PT Long Term Goals - 02/27/18 0959      PT LONG TERM GOAL #1   Title  ability to perform HEP for core and LE strength with 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    Time  8    Period  Weeks    Status  On-going      PT LONG TERM GOAL #2   Title  able to perform correct body mechanics with daily tasks to reduce strain in on her lumbar to manage her back pain    Time  8    Period  Weeks    Status  Achieved      PT LONG TERM GOAL #3   Title  sit to stand </= 13 seconds due to improved bilateral lower extremity strength >/= 4/5 and improved balance    Time  8    Period  Weeks    Status  On-going      PT LONG TERM GOAL #4   Title  lumbar pain with daily activities are moderate due to ability  managing her pain    Time  8    Period  Weeks    Status  On-going            Plan - 02/27/18 1016    Clinical Impression Statement  Patient pain has decreased from 9/10 to 6/10.  Patient has increased strength of bilateral lower extremities.  Patient sits and stands with good posture.  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 Patient has improve endurance and does not complain about back pain during exercise.  Patient will benefit from skilled  therapy to improve strength and endurance while monitoring for back pain.     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    PT Frequency  2x / week    PT Duration  8 weeks    PT Treatment/Interventions  Cryotherapy;Electrical Stimulation;Other (comment);Moist Heat;Ultrasound;Traction;Therapeutic exercise;Balance training;Therapeutic activities;Gait training;Neuromuscular re-education;Patient/family education;Manual techniques;Dry needling;Energy conservation    PT Next Visit Plan  continue with core and lower extremity strength    PT Home Exercise Plan  progress as needed    Consulted and Agree with Plan of Care  Patient       Patient will benefit from skilled therapeutic intervention in order to improve the following deficits and impairments:  Abnormal gait, Pain, Increased muscle spasms, Decreased endurance, Decreased activity tolerance, Decreased strength, Difficulty walking, Impaired flexibility  Visit Diagnosis: Acute bilateral low back pain, with sciatica presence unspecified  Muscle weakness (generalized)  Other abnormalities of gait and mobility     Problem List Patient Active Problem List   Diagnosis Date Noted  . Cerebral aneurysm   . Dementia with behavioral disturbance 12/20/2016  . Aggressive behavior   . CLL (chronic lymphocytic leukemia) ([place]) 09/14/2014  . 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 HYPERTENSION, BENIGN 05/31/2010  . ATRIAL FIBRILLATION 05/31/2010    Earlie Counts, PT 02/27/18 10:21 AM    [facility] [address] [place], [place], [address] Phone: [phone]   Fax:  [phone]  Name: TAMILYN LUPIEN MRN: [id number] Date of Birth: 20-Mar-1941

## 2018-03-02 ENCOUNTER — Ambulatory Visit: Payer: Medicare Other | Attending: Specialist | Admitting: Physical Therapy

## 2018-03-02 ENCOUNTER — Ambulatory Visit (INDEPENDENT_AMBULATORY_CARE_PROVIDER_SITE_OTHER): Payer: Medicare Other | Admitting: Specialist

## 2018-03-02 ENCOUNTER — Encounter: Payer: Self-pay | Admitting: Physical Therapy

## 2018-03-02 DIAGNOSIS — M545 Low back pain: Secondary | ICD-10-CM | POA: Diagnosis present

## 2018-03-02 DIAGNOSIS — R2689 Other abnormalities of gait and mobility: Secondary | ICD-10-CM

## 2018-03-02 DIAGNOSIS — M6281 Muscle weakness (generalized): Secondary | ICD-10-CM | POA: Diagnosis present

## 2018-03-02 NOTE — Therapy (Signed)
[facility] [address], [place], [place], [address] Phone: [phone]   Fax:  [phone]  Physical Therapy Treatment  Patient Details  Name: Bethany Oconnor MRN: [id number] Date of Birth: 1941-04-05 Referring Provider: Dr. Basil Dess   Encounter Date: 03/02/2018  PT End of Session - 03/02/18 1600    Visit Number  10    Date for PT Re-Evaluation  03/26/18    Authorization Type  [employer], 10th visit note done on the 9th visit    PT Start Time  1530    PT Stop Time  1615    PT Time Calculation (min)  45 min    Activity Tolerance  Patient limited by pain    Behavior During Therapy  [facility] for tasks assessed/performed       Past Medical History:  Diagnosis Date  . Anxiety   . Cerebral aneurysm    s/p endovascular colling 2008  . Depression   . Hearing loss   . Macular degeneration   . Neuropathy   . Paroxysmal atrial fibrillation (HCC)   . Pneumonia    08-04-17    Past Surgical History:  Procedure Laterality Date  . ANEURYSM COILING    . CATARACT EXTRACTION     bilateral  . CERVICAL CONE BIOPSY    . EVALUATION UNDER ANESTHESIA WITH HEMORRHOIDECTOMY N/A 09/03/2017   Procedure: EXAM UNDER ANESTHESIA, EXCISION OF ANAL LESION, POSSIBLE HEMORRHOIDECTOMY;  Surgeon: Ileana Roup, MD;  Location: [facility];  Service: General;  Laterality: N/A;  . THYROIDECTOMY    . TONSILLECTOMY      There were no vitals filed for this visit.  Subjective Assessment - 03/02/18 1535    Subjective  My back is hurting today.      Patient Stated Goals  reduce pain, increase strength of back and legs    Currently in Pain?  Yes    Pain Score  10-Worst pain ever    Pain Location  Back    Pain Orientation  Lower    Pain Descriptors / Indicators  Constant;Sharp;Tingling;Numbness;Heaviness    Pain Type  Acute pain    Pain Onset  More than a month ago    Pain Frequency  Constant    Aggravating Factors   constant    Pain Relieving  Factors  TENS, heat    Multiple Pain Sites  No                       [facility] Adult PT Treatment/Exercise - 03/02/18 0001      Lumbar Exercises: Seated   Long Arc Quad on Chair  Strengthening;Both;2 sets;20 reps;Weights    LAQ on Chair Weights (lbs)  2 pain increased, did with heat on back      Modalities   Modalities  Electrical Stimulation;Moist Heat      Moist Heat Therapy   Number Minutes Moist Heat  20 Minutes    Moist Heat Location  Lumbar Spine extra padding      Electrical Stimulation   Electrical Stimulation Location  lumbar with reclined    Electrical Stimulation Action  IFC    Electrical Stimulation Parameters  to patient tolerance, 20 minutes    Electrical Stimulation Goals  Pain               PT Short Term Goals - 02/27/18 3295      PT SHORT TERM GOAL #1   Title  independent with initial HEP for  stretching her hamstrings and lumbar flexion exercises    Time  4    Period  Weeks    Status  Achieved      PT SHORT TERM GOAL #2   Title  understand how to use home TENS unit to manage pain with caretaker able to place electodes on patient    Time  4    Period  Weeks    Status  Achieved        PT Long Term Goals - 02/27/18 0959      PT LONG TERM GOAL #1   Title  ability to perform HEP for core and LE strength with 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    Time  8    Period  Weeks    Status  On-going      PT LONG TERM GOAL #2   Title  able to perform correct body mechanics with daily tasks to reduce strain in on her lumbar to manage her back pain    Time  8    Period  Weeks    Status  Achieved      PT LONG TERM GOAL #3   Title  sit to stand </= 13 seconds due to improved bilateral lower extremity strength >/= 4/5 and improved balance    Time  8    Period  Weeks    Status  On-going      PT LONG TERM GOAL #4   Title  lumbar pain with daily activities are moderate due to ability managing her pain    Time  8    Period   Weeks    Status  On-going            Plan - 03/02/18 1601    Clinical Impression Statement  Patient was not able to perfrom activity today due to back pain 10/10.  Patient looked like she was in pain.  Patient tried to exercise with LAQ 2 pound weight on ankles but pain increased.  Treatment then consisted of moist heat and IFC in a reclined position.  If patient feels better resume exercise.     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    PT Frequency  2x / week    PT Duration  8 weeks    PT Treatment/Interventions  Cryotherapy;Electrical Stimulation;Other (comment);Moist Heat;Ultrasound;Traction;Therapeutic exercise;Balance training;Therapeutic activities;Gait training;Neuromuscular re-education;Patient/family education;Manual techniques;Dry needling;Energy conservation    PT Next Visit Plan  modalities if needed; resume exercise program    PT Home Exercise Plan  progress as needed    Consulted and Agree with Plan of Care  Patient       Patient will benefit from skilled therapeutic intervention in order to improve the following deficits and impairments:  Abnormal gait, Pain, Increased muscle spasms, Decreased endurance, Decreased activity tolerance, Decreased strength, Difficulty walking, Impaired flexibility  Visit Diagnosis: Acute bilateral low back pain, with sciatica presence unspecified  Muscle weakness (generalized)  Other abnormalities of gait and mobility     Problem List Patient Active Problem List   Diagnosis Date Noted  . Cerebral aneurysm   . Dementia with behavioral disturbance 12/20/2016  . Aggressive behavior   . CLL (chronic lymphocytic leukemia) ([place]) 09/14/2014  . 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 HYPERTENSION, BENIGN 05/31/2010  .  ATRIAL  FIBRILLATION 05/31/2010    Earlie Counts, PT 03/02/18 4:05 PM   [facility] [address] [place], [place], [address] Phone: [phone]   Fax:  [phone]  Name: LENNETTE FADER MRN: [id number] Date of Birth: February 15, 1941

## 2018-03-04 ENCOUNTER — Ambulatory Visit: Payer: Medicare Other | Admitting: Physical Therapy

## 2018-03-04 DIAGNOSIS — M6281 Muscle weakness (generalized): Secondary | ICD-10-CM

## 2018-03-04 DIAGNOSIS — M545 Low back pain: Secondary | ICD-10-CM

## 2018-03-04 NOTE — Therapy (Signed)
[facility] [address], [place], [place], [address] Phone: [phone]   Fax:  [phone]  Physical Therapy Treatment  Patient Details  Name: Bethany Oconnor MRN: [id number] Date of Birth: May 02, 1941 Referring Provider: Dr. Basil Dess   Encounter Date: 03/04/2018  PT End of Session - 03/04/18 1541    Visit Number  11    Date for PT Re-Evaluation  03/26/18    Authorization Type  [employer], 10th visit note done on the 9th visit    PT Start Time  1530    PT Stop Time  1610    PT Time Calculation (min)  40 min    Activity Tolerance  Patient limited by pain    Behavior During Therapy  [facility] for tasks assessed/performed       Past Medical History:  Diagnosis Date  . Anxiety   . Cerebral aneurysm    s/p endovascular colling 2008  . Depression   . Hearing loss   . Macular degeneration   . Neuropathy   . Paroxysmal atrial fibrillation (HCC)   . Pneumonia    08-04-17    Past Surgical History:  Procedure Laterality Date  . ANEURYSM COILING    . CATARACT EXTRACTION     bilateral  . CERVICAL CONE BIOPSY    . EVALUATION UNDER ANESTHESIA WITH HEMORRHOIDECTOMY N/A 09/03/2017   Procedure: EXAM UNDER ANESTHESIA, EXCISION OF ANAL LESION, POSSIBLE HEMORRHOIDECTOMY;  Surgeon: Ileana Roup, MD;  Location: [facility];  Service: General;  Laterality: N/A;  . THYROIDECTOMY    . TONSILLECTOMY      There were no vitals filed for this visit.      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 Basil Dess    Onset Date/Surgical Date  10/30/17    Prior Therapy  None      Precautions   Precautions  Other (comment);Back    Precaution Comments  Blind; Avoid bending, stooping, lifting weigh > 10#, prolonged standing, and walking      Restrictions   Weight Bearing Restrictions  No      Home  Environment   Living Environment  Assisted living    Living Arrangements  Alone      Prior Function   Level of Independence  Independent with basic ADLs    Vocation  Retired    Leisure  yoga      Cognition   Overall Cognitive Status  Within Functional Limits for tasks assessed      Observation/Other Assessments   Focus on Therapeutic Outcomes (FOTO)   62% limitation; goal is 52% limitation      Sensation   Light Touch  Impaired Detail    Light Touch Impaired Details  -- bilateral feet      Posture/Postural Control   Posture/Postural Control  No significant limitations      AROM   Overall AROM Comments  lumbar ROM is limited due to doctors orders      Strength   Right Hip Flexion  4+/5    Right Hip ABduction  4/5    Left Hip Flexion  4+/5    Left Hip ABduction  4/5    Right Knee Flexion  5/5    Right Knee Extension  5/5    Left Knee Flexion  5/5    Left Knee Extension  5/5    Right Ankle Plantar Flexion  3/5    Right Ankle Eversion  5/5    Left Ankle Plantar Flexion  3/5    Left Ankle Eversion  5/5      Special Tests    Special Tests  Lumbar    Lumbar Tests  Slump Test      Slump test   Findings  Positive    Side  Right    Comment  increase numbness and tingling      Standardized Balance Assessment   Five times sit to stand comments   13                   [facility] Adult PT Treatment/Exercise - 03/04/18 0001      Lumbar Exercises: Aerobic   Nustep  L2 x 10 min while discuss patient progress      Lumbar Exercises: Seated   Long Arc Quad on Chair  Strengthening;Both;2 sets;20 reps;Weights increased pain with right LAQ    LAQ on Chair Weights (lbs)  2 pain increased, did with heat on back    Other Seated Lumbar Exercises  sitting hip flexion 2# bil. feet 30x; ball squeeze hold 5 sec 10x; knee abduction with red band                PT Short Term Goals - 02/27/18 0959      PT SHORT TERM GOAL #1   Title  independent with initial HEP for  stretching her hamstrings and lumbar flexion exercises    Time  4    Period  Weeks    Status  Achieved      PT SHORT TERM GOAL #2   Title  understand how to use home TENS unit to manage pain with caretaker able to place electodes on patient    Time  4    Period  Weeks    Status  Achieved        PT Long Term Goals - 03/04/18 1610      PT LONG TERM GOAL #1   Title  ability to perform HEP for core and LE strength with 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    Time  8    Period  Weeks    Status  Achieved      PT LONG TERM GOAL #2   Title  able to perform correct body mechanics with daily tasks to reduce strain in on her lumbar to manage her back pain    Time  8    Period  Weeks    Status  Achieved      PT LONG TERM GOAL #3   Title  sit to stand </= 13 seconds due to improved bilateral lower extremity strength >/= 4/5 and improved balance    Time  8    Period  Weeks    Status  Achieved      PT LONG TERM GOAL #4   Title  lumbar pain with daily activities are moderate due to ability managing her pain    Time  8    Period  Weeks    Status  Not Met            Plan - 03/04/18 1611    Clinical Impression Statement  Patient has met all her goals except for the pain goal.  Patient has improved strength and balance.  Patient pain has not changed. Patient has a positive slump test on the right.  Patient has decreased sensation on both feet. Patient has an individual come to her home 4 times per week for 1 hour of yoga and strengthening exercise.  Patient is able to show good upright posture with exercises and daily activities.      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    PT Treatment/Interventions  Cryotherapy;Electrical Stimulation;Other (comment);Moist  Heat;Ultrasound;Traction;Therapeutic exercise;Balance training;Therapeutic activities;Gait training;Neuromuscular re-education;Patient/family education;Manual techniques;Dry needling;Energy conservation    PT Next Visit Plan  Discharge to HEP    PT Home Exercise Plan  Current HEP    Consulted and Agree with Plan of Care  Patient       Patient will benefit from skilled therapeutic intervention in order to improve the following deficits and impairments:  Abnormal gait, Pain, Increased muscle spasms, Decreased endurance, Decreased activity tolerance, Decreased strength, Difficulty walking, Impaired flexibility  Visit Diagnosis: Acute bilateral low back pain, with sciatica presence unspecified  Muscle weakness (generalized)     Problem List Patient Active Problem List   Diagnosis Date Noted  . Cerebral aneurysm   . Dementia with behavioral disturbance 12/20/2016  . Aggressive behavior   . CLL (chronic lymphocytic leukemia) ([place]) 09/14/2014  . 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 HYPERTENSION, BENIGN 05/31/2010  . ATRIAL FIBRILLATION 05/31/2010    Leonia Heatherly 03/04/2018, 4:16 PM  [facility] [address] [place], [place], [address] Phone: [phone]   Fax:  [phone]  Name: Bethany Oconnor MRN: [id number] Date of Birth: [birth year]  PHYSICAL THERAPY DISCHARGE SUMMARY  Visits from Start of Care: 11  Current functional level related to goals / functional outcomes: See above.    Remaining deficits: See above. Patient main issue is pain.    Education / Equipment: HEP Plan: Patient agrees to discharge.  Patient goals were partially met. Patient is being discharged due to meeting the stated rehab goals.  Thank you for the referral. Earlie Counts, PT 03/04/18 4:16 PM  ?????

## 2018-03-09 ENCOUNTER — Ambulatory Visit: Payer: Medicare Other | Admitting: Physical Therapy

## 2018-03-11 ENCOUNTER — Ambulatory Visit: Payer: Medicare Other | Admitting: Physical Therapy

## 2018-03-12 ENCOUNTER — Ambulatory Visit (INDEPENDENT_AMBULATORY_CARE_PROVIDER_SITE_OTHER): Payer: Medicare Other | Admitting: Specialist

## 2018-03-13 ENCOUNTER — Ambulatory Visit (INDEPENDENT_AMBULATORY_CARE_PROVIDER_SITE_OTHER): Payer: Medicare Other | Admitting: Specialist

## 2018-03-16 ENCOUNTER — Other Ambulatory Visit (INDEPENDENT_AMBULATORY_CARE_PROVIDER_SITE_OTHER): Payer: Self-pay | Admitting: Radiology

## 2018-03-16 MED ORDER — IBUPROFEN 400 MG PO TABS: 400.0000 mg | ORAL_TABLET | Freq: Three times a day (TID) | ORAL | 0 refills | Status: DC | PRN

## 2018-03-26 ENCOUNTER — Ambulatory Visit (INDEPENDENT_AMBULATORY_CARE_PROVIDER_SITE_OTHER): Payer: Medicare Other | Admitting: Specialist

## 2018-04-03 ENCOUNTER — Other Ambulatory Visit: Payer: Self-pay

## 2018-04-03 ENCOUNTER — Telehealth: Payer: Self-pay

## 2018-04-03 ENCOUNTER — Encounter (HOSPITAL_COMMUNITY): Payer: Self-pay | Admitting: *Deleted

## 2018-04-03 ENCOUNTER — Other Ambulatory Visit: Payer: Self-pay | Admitting: Neurological Surgery

## 2018-04-03 NOTE — Telephone Encounter (Signed)
   [facility] Pre-operative Risk Assessment    Request for surgical clearance:  1. What type of surgery is being performed? Right L4-5 L5-S1 Laminectomy/Microdiscectomy   2. When is this surgery scheduled? 04/07/18   3. What type of clearance is required (medical clearance vs. Pharmacy clearance to hold med vs. Both)? medical  4. Are there any medications that need to be held prior to surgery and how long?   5. Practice name and name of physician performing surgery? [facility] Dr Kristeen Miss   6. What is your office phone number  [phone]    7.   What is your office fax number [phone]  8.   Anesthesia type (None, local, MAC, general) ?    Legrand Como  Mialani Reicks 04/03/2018, 12:22 PM  _________________________________________________________________   (provider comments below)

## 2018-04-03 NOTE — Telephone Encounter (Signed)
   Primary Cardiologist: Sherren Mocha, MD  Chart reviewed as part of pre-operative protocol coverage. Patient was contacted 04/03/2018 in reference to pre-operative risk assessment for pending surgery as outlined below.  Bethany Oconnor was last seen on 02/16/18 by Dr. Burt Knack. H/o PAF, HTN, small ASD, cerebrovascular aneurysm s/p coiling, MD, depression. No recent recurrence of atrial fib. Pradaxa was discontinued given recurrent back problems, intermittent need for procedures such as spinal injections, use of nonsteroidal anti-inflammatories, and increasing fall risk in the setting of deteriorating vision and gait instability. Echo 2017 EF 50-55%. No prior hx of CAD noted. RCRI 0.4% indicating low risk of cardiac complications.  Since that day, Bethany Oconnor has not had any new cardiac symptoms. Activity is very limited by her back pain but she can walk through her home, wash dishes and go up a hill slowly if needed without angina, dyspnea, syncope or palpitations.  Therefore, based on ACC/AHA guidelines, the patient would be at acceptable risk for the planned procedure without further cardiovascular testing.   I will route this recommendation to the requesting party via Epic fax function and remove from pre-op pool.  Please call with questions.  Charlie Pitter, PA-C 04/03/2018, 2:06 PM

## 2018-04-03 NOTE — Progress Notes (Signed)
Spoke with pt for pre-op call. Pt has hx of A-fib, sees Dr. Burt Knack. Cardiac clearance in Epic dated today. Pt denies any recent chest pain or sob. Pt denies being diabetic. 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 Pt lives at [place] and has 24/7 caregiver. [facility] [employer] department brings pt her medications, she has no idea which pills are what. She stated that I would need to call them and tell them what she can take Tuesday AM. I will call them on Monday.

## 2018-04-06 NOTE — Pre-Procedure Instructions (Signed)
   Bethany Oconnor  04/06/2018    Mrs. Bobier's procedure is scheduled on Tuesday, 04/07/18 at 12:45 PM.   Report to [facility] Entrance "A" Admitting Office at 10:15 AM.   Call this number if you have problems the morning of surgery: [phone]   Remember:  Patient is not to eat or drink after midnight tonight.             Take these medicines the morning of surgery with A SIP OF WATER: Bupropion (Wellbutrin), Diazepam (Valium), Diltiazem (Cardizem), Lamotrigine (Lamictal), Memantine (Namenda), Omeprazole (Prilosec), Prempro, Tylenol - prn, may use eye drops.    Do not wear jewelry, make-up or nail polish.  Do not wear lotions, powders, perfumes or deodorant.  Do not shave 48 hours prior to surgery.    Do not bring valuables to the hospital.  [facility] is not responsible for any belongings or valuables.  Contacts, dentures or bridgework may not be worn into surgery.  Leave your suitcase in the car.  After surgery it may be brought to your room.  For patients admitted to the hospital, discharge time will be determined by your treatment team.    Any questions, please call Lilia Pro, RN today by 4 PM if any questions at [phone].

## 2018-04-06 NOTE — Progress Notes (Signed)
Spoke with Margarita Grizzle, RN at [place] at [facility]. Pt is a resident at [facility] and she has her medications brought to her daily. I gave Margarita Grizzle the list of medications that Mrs. Devilla can take tomorrow morning prior to surgery. Margarita Grizzle asked if I could fax the medication list to her along with the other pre-op instructions that I gave Mrs. Domingo Cocking. I have faxed that to [place] at [phone].

## 2018-04-07 ENCOUNTER — Ambulatory Visit (HOSPITAL_COMMUNITY): Payer: Medicare Other | Admitting: Certified Registered"

## 2018-04-07 ENCOUNTER — Encounter (HOSPITAL_COMMUNITY): Payer: Self-pay | Admitting: Urology

## 2018-04-07 ENCOUNTER — Encounter (HOSPITAL_COMMUNITY)
Admission: RE | Disposition: A | Discharge status: Skilled Nursing Facility | Payer: Self-pay | Source: Ambulatory Visit | Attending: Neurological Surgery

## 2018-04-07 ENCOUNTER — Observation Stay (HOSPITAL_COMMUNITY)
Admission: RE | Admit: 2018-04-07 | Discharge: 2018-04-09 | Disposition: A | Discharge status: Skilled Nursing Facility | Payer: Medicare Other | Source: Ambulatory Visit | Attending: Neurological Surgery | Admitting: Neurological Surgery

## 2018-04-07 ENCOUNTER — Ambulatory Visit (HOSPITAL_COMMUNITY): Payer: Medicare Other

## 2018-04-07 ENCOUNTER — Other Ambulatory Visit: Payer: Self-pay

## 2018-04-07 DIAGNOSIS — Z8249 Family history of ischemic heart disease and other diseases of the circulatory system: Secondary | ICD-10-CM | POA: Insufficient documentation

## 2018-04-07 DIAGNOSIS — M5116 Intervertebral disc disorders with radiculopathy, lumbar region: Principal | ICD-10-CM | POA: Insufficient documentation

## 2018-04-07 DIAGNOSIS — F329 Major depressive disorder, single episode, unspecified: Secondary | ICD-10-CM | POA: Diagnosis not present

## 2018-04-07 DIAGNOSIS — M1990 Unspecified osteoarthritis, unspecified site: Secondary | ICD-10-CM | POA: Insufficient documentation

## 2018-04-07 DIAGNOSIS — I10 Essential (primary) hypertension: Secondary | ICD-10-CM | POA: Insufficient documentation

## 2018-04-07 DIAGNOSIS — Z856 Personal history of leukemia: Secondary | ICD-10-CM | POA: Insufficient documentation

## 2018-04-07 DIAGNOSIS — Z79899 Other long term (current) drug therapy: Secondary | ICD-10-CM | POA: Insufficient documentation

## 2018-04-07 DIAGNOSIS — Z888 Allergy status to other drugs, medicaments and biological substances status: Secondary | ICD-10-CM | POA: Insufficient documentation

## 2018-04-07 DIAGNOSIS — D62 Acute posthemorrhagic anemia: Secondary | ICD-10-CM

## 2018-04-07 DIAGNOSIS — Z87891 Personal history of nicotine dependence: Secondary | ICD-10-CM | POA: Diagnosis not present

## 2018-04-07 DIAGNOSIS — G8929 Other chronic pain: Secondary | ICD-10-CM

## 2018-04-07 DIAGNOSIS — M5117 Intervertebral disc disorders with radiculopathy, lumbosacral region: Secondary | ICD-10-CM | POA: Diagnosis not present

## 2018-04-07 DIAGNOSIS — H3530 Unspecified macular degeneration: Secondary | ICD-10-CM | POA: Diagnosis not present

## 2018-04-07 DIAGNOSIS — Z885 Allergy status to narcotic agent status: Secondary | ICD-10-CM | POA: Insufficient documentation

## 2018-04-07 DIAGNOSIS — I671 Cerebral aneurysm, nonruptured: Secondary | ICD-10-CM | POA: Insufficient documentation

## 2018-04-07 DIAGNOSIS — M4726 Other spondylosis with radiculopathy, lumbar region: Secondary | ICD-10-CM | POA: Diagnosis not present

## 2018-04-07 DIAGNOSIS — M4727 Other spondylosis with radiculopathy, lumbosacral region: Secondary | ICD-10-CM | POA: Diagnosis not present

## 2018-04-07 DIAGNOSIS — Z881 Allergy status to other antibiotic agents status: Secondary | ICD-10-CM | POA: Insufficient documentation

## 2018-04-07 DIAGNOSIS — G629 Polyneuropathy, unspecified: Secondary | ICD-10-CM | POA: Diagnosis not present

## 2018-04-07 DIAGNOSIS — M4187 Other forms of scoliosis, lumbosacral region: Secondary | ICD-10-CM | POA: Insufficient documentation

## 2018-04-07 DIAGNOSIS — M5127 Other intervertebral disc displacement, lumbosacral region: Secondary | ICD-10-CM | POA: Diagnosis present

## 2018-04-07 DIAGNOSIS — Z419 Encounter for procedure for purposes other than remedying health state, unspecified: Secondary | ICD-10-CM

## 2018-04-07 DIAGNOSIS — F0390 Unspecified dementia without behavioral disturbance: Secondary | ICD-10-CM | POA: Insufficient documentation

## 2018-04-07 DIAGNOSIS — I480 Paroxysmal atrial fibrillation: Secondary | ICD-10-CM | POA: Diagnosis not present

## 2018-04-07 DIAGNOSIS — M4186 Other forms of scoliosis, lumbar region: Secondary | ICD-10-CM | POA: Diagnosis not present

## 2018-04-07 DIAGNOSIS — I739 Peripheral vascular disease, unspecified: Secondary | ICD-10-CM | POA: Insufficient documentation

## 2018-04-07 DIAGNOSIS — M545 Low back pain, unspecified: Secondary | ICD-10-CM

## 2018-04-07 DIAGNOSIS — H548 Legal blindness, as defined in USA: Secondary | ICD-10-CM | POA: Insufficient documentation

## 2018-04-07 HISTORY — DX: Other specified postprocedural states: Z98.890

## 2018-04-07 HISTORY — DX: Unspecified osteoarthritis, unspecified site: M19.90

## 2018-04-07 HISTORY — DX: Chronic lymphocytic leukemia of B-cell type not having achieved remission: C91.10

## 2018-04-07 HISTORY — DX: Disorder of thyroid, unspecified: E07.9

## 2018-04-07 HISTORY — PX: LUMBAR LAMINECTOMY/DECOMPRESSION MICRODISCECTOMY: SHX5026

## 2018-04-07 HISTORY — DX: Nausea with vomiting, unspecified: R11.2

## 2018-04-07 LAB — CBC
HCT: 41.3 % (ref 36.0–46.0)
Hemoglobin: 13.4 g/dL (ref 12.0–15.0)
MCH: 33.2 pg (ref 26.0–34.0)
MCHC: 32.4 g/dL (ref 30.0–36.0)
MCV: 102.2 fL — ABNORMAL HIGH (ref 78.0–100.0)
PLATELETS: 206 10*3/uL (ref 150–400)
RBC: 4.04 MIL/uL (ref 3.87–5.11)
RDW: 13.9 % (ref 11.5–15.5)
WBC: 35 10*3/uL — ABNORMAL HIGH (ref 4.0–10.5)

## 2018-04-07 LAB — BASIC METABOLIC PANEL
ANION GAP: 10 (ref 5–15)
BUN: 11 mg/dL (ref 8–23)
CALCIUM: 9 mg/dL (ref 8.9–10.3)
CO2: 26 mmol/L (ref 22–32)
Chloride: 108 mmol/L (ref 98–111)
Creatinine, Ser: 0.95 mg/dL (ref 0.44–1.00)
GFR, EST NON AFRICAN AMERICAN: 55 mL/min — AB (ref >60)
GLUCOSE: 89 mg/dL (ref 70–99)
Potassium: 4.3 mmol/L (ref 3.5–5.1)
Sodium: 144 mmol/L (ref 135–145)

## 2018-04-07 SURGERY — LUMBAR LAMINECTOMY/DECOMPRESSION MICRODISCECTOMY 2 LEVELS
Anesthesia: General | Site: Back | Laterality: Right

## 2018-04-07 MED ORDER — LIDOCAINE-EPINEPHRINE 1 %-1:100000 IJ SOLN
INTRAMUSCULAR | Status: DC | PRN
  Administered 2018-04-07: 3 mL

## 2018-04-07 MED ORDER — POLYETHYLENE GLYCOL 3350 17 G PO PACK: 17.0000 g | PACK | Freq: Every day | ORAL | Status: DC | PRN

## 2018-04-07 MED ORDER — LAMOTRIGINE 25 MG PO TABS: 625.0000 mg | ORAL_TABLET | ORAL | Status: DC

## 2018-04-07 MED ORDER — PROPOFOL 10 MG/ML IV BOLUS
INTRAVENOUS | Status: DC | PRN
  Administered 2018-04-07: 100 mg via INTRAVENOUS

## 2018-04-07 MED ORDER — CHLORHEXIDINE GLUCONATE CLOTH 2 % EX PADS: 6.0000 | MEDICATED_PAD | Freq: Once | CUTANEOUS | Status: DC

## 2018-04-07 MED ORDER — LIDOCAINE 2% (20 MG/ML) 5 ML SYRINGE
INTRAMUSCULAR | Status: DC | PRN
  Administered 2018-04-07: 60 mg via INTRAVENOUS

## 2018-04-07 MED ORDER — ONDANSETRON HCL 4 MG/2ML IJ SOLN
INTRAMUSCULAR | Status: DC | PRN
  Administered 2018-04-07: 4 mg via INTRAVENOUS

## 2018-04-07 MED ORDER — ONDANSETRON HCL 4 MG/2ML IJ SOLN
4.0000 mg | Freq: Once | INTRAMUSCULAR | Status: AC | PRN
  Administered 2018-04-07: 4 mg via INTRAVENOUS

## 2018-04-07 MED ORDER — FENTANYL CITRATE (PF) 100 MCG/2ML IJ SOLN
25.0000 ug | INTRAMUSCULAR | Status: DC | PRN
  Administered 2018-04-07 (×2): 25 ug via INTRAVENOUS

## 2018-04-07 MED ORDER — FENTANYL CITRATE (PF) 250 MCG/5ML IJ SOLN
INTRAMUSCULAR | Status: AC
Start: 2018-04-07 — End: ?
  Filled 2018-04-07: qty 5

## 2018-04-07 MED ORDER — SODIUM CHLORIDE 0.9% FLUSH
3.0000 mL | Freq: Two times a day (BID) | INTRAVENOUS | Status: DC
  Administered 2018-04-08 – 2018-04-09 (×2): 3 mL via INTRAVENOUS

## 2018-04-07 MED ORDER — ACETAMINOPHEN 650 MG RE SUPP: 650.0000 mg | RECTAL | Status: DC | PRN

## 2018-04-07 MED ORDER — METHOCARBAMOL 1000 MG/10ML IJ SOLN
500.0000 mg | Freq: Four times a day (QID) | INTRAVENOUS | Status: DC | PRN
  Filled 2018-04-07: qty 5

## 2018-04-07 MED ORDER — ONDANSETRON HCL 4 MG/2ML IJ SOLN
INTRAMUSCULAR | Status: AC
  Filled 2018-04-07: qty 2

## 2018-04-07 MED ORDER — DIAZEPAM 2 MG PO TABS
4.0000 mg | ORAL_TABLET | Freq: Every day | ORAL | Status: DC
  Administered 2018-04-07 – 2018-04-08 (×2): 4 mg via ORAL
  Filled 2018-04-07 (×2): qty 2

## 2018-04-07 MED ORDER — MEMANTINE HCL 5 MG PO TABS
5.0000 mg | ORAL_TABLET | Freq: Every day | ORAL | Status: DC
  Administered 2018-04-08 – 2018-04-09 (×2): 5 mg via ORAL
  Filled 2018-04-07 (×2): qty 1

## 2018-04-07 MED ORDER — ROCURONIUM BROMIDE 10 MG/ML (PF) SYRINGE
PREFILLED_SYRINGE | INTRAVENOUS | Status: DC | PRN
  Administered 2018-04-07: 40 mg via INTRAVENOUS
  Administered 2018-04-07 (×2): 20 mg via INTRAVENOUS

## 2018-04-07 MED ORDER — PANTOPRAZOLE SODIUM 40 MG PO TBEC
40.0000 mg | DELAYED_RELEASE_TABLET | Freq: Every day | ORAL | Status: DC
  Administered 2018-04-08 – 2018-04-09 (×2): 40 mg via ORAL
  Filled 2018-04-07 (×2): qty 1

## 2018-04-07 MED ORDER — LATANOPROST 0.005 % OP SOLN
1.0000 [drp] | Freq: Every day | OPHTHALMIC | Status: DC
  Administered 2018-04-08: 1 [drp] via OPHTHALMIC
  Filled 2018-04-07: qty 2.5

## 2018-04-07 MED ORDER — SUGAMMADEX SODIUM 200 MG/2ML IV SOLN
INTRAVENOUS | Status: DC | PRN
  Administered 2018-04-07: 200 mg via INTRAVENOUS

## 2018-04-07 MED ORDER — SODIUM CHLORIDE 0.9 % IV SOLN: 250.0000 mL | INTRAVENOUS | Status: DC

## 2018-04-07 MED ORDER — FENTANYL CITRATE (PF) 100 MCG/2ML IJ SOLN
INTRAMUSCULAR | Status: DC | PRN
  Administered 2018-04-07: 50 ug via INTRAVENOUS
  Administered 2018-04-07: 100 ug via INTRAVENOUS

## 2018-04-07 MED ORDER — MORPHINE SULFATE (PF) 2 MG/ML IV SOLN: 2.0000 mg | INTRAVENOUS | Status: DC | PRN

## 2018-04-07 MED ORDER — BUPIVACAINE HCL (PF) 0.5 % IJ SOLN
INTRAMUSCULAR | Status: DC | PRN
  Administered 2018-04-07: 20 mL
  Administered 2018-04-07: 3 mL

## 2018-04-07 MED ORDER — DORZOLAMIDE HCL-TIMOLOL MAL 2-0.5 % OP SOLN: 1.0000 [drp] | Freq: Two times a day (BID) | OPHTHALMIC | Status: DC

## 2018-04-07 MED ORDER — DEXAMETHASONE SODIUM PHOSPHATE 10 MG/ML IJ SOLN
INTRAMUSCULAR | Status: DC | PRN
  Administered 2018-04-07: 10 mg via INTRAVENOUS

## 2018-04-07 MED ORDER — ONDANSETRON HCL 4 MG/2ML IJ SOLN
4.0000 mg | Freq: Four times a day (QID) | INTRAMUSCULAR | Status: DC | PRN
  Administered 2018-04-08: 4 mg via INTRAVENOUS
  Filled 2018-04-07: qty 2

## 2018-04-07 MED ORDER — BISACODYL 10 MG RE SUPP: 10.0000 mg | Freq: Every day | RECTAL | Status: DC | PRN

## 2018-04-07 MED ORDER — ACETAMINOPHEN 325 MG PO TABS
650.0000 mg | ORAL_TABLET | ORAL | Status: DC | PRN
Start: 2018-04-07 — End: 2018-04-09

## 2018-04-07 MED ORDER — PROPOFOL 10 MG/ML IV BOLUS
INTRAVENOUS | Status: AC
  Filled 2018-04-07: qty 20

## 2018-04-07 MED ORDER — MENTHOL 3 MG MT LOZG: 1.0000 | LOZENGE | OROMUCOSAL | Status: DC | PRN

## 2018-04-07 MED ORDER — FENTANYL CITRATE (PF) 100 MCG/2ML IJ SOLN
INTRAMUSCULAR | Status: AC
  Filled 2018-04-07: qty 2

## 2018-04-07 MED ORDER — LACTATED RINGERS IV SOLN
INTRAVENOUS | Status: DC
  Administered 2018-04-07 (×2): via INTRAVENOUS

## 2018-04-07 MED ORDER — HYDROCORTISONE ACETATE 25 MG RE SUPP
25.0000 mg | Freq: Two times a day (BID) | RECTAL | Status: DC | PRN
  Filled 2018-04-07: qty 1

## 2018-04-07 MED ORDER — VANCOMYCIN HCL IN DEXTROSE 1-5 GM/200ML-% IV SOLN
1000.0000 mg | INTRAVENOUS | Status: AC
  Administered 2018-04-07: 1000 mg via INTRAVENOUS
  Filled 2018-04-07: qty 200

## 2018-04-07 MED ORDER — SODIUM CHLORIDE 0.9 % IV SOLN
INTRAVENOUS | Status: DC | PRN
  Administered 2018-04-07: 16:00:00

## 2018-04-07 MED ORDER — PHENOL 1.4 % MT LIQD: 1.0000 | OROMUCOSAL | Status: DC | PRN

## 2018-04-07 MED ORDER — HEMOSTATIC AGENTS (NO CHARGE) OPTIME
TOPICAL | Status: DC | PRN
  Administered 2018-04-07: 1 via TOPICAL

## 2018-04-07 MED ORDER — ONDANSETRON HCL 4 MG PO TABS: 4.0000 mg | ORAL_TABLET | ORAL | Status: DC | PRN

## 2018-04-07 MED ORDER — SODIUM CHLORIDE 0.9% FLUSH
3.0000 mL | INTRAVENOUS | Status: DC | PRN
Start: 2018-04-07 — End: 2018-04-09

## 2018-04-07 MED ORDER — ONDANSETRON HCL 4 MG PO TABS: 4.0000 mg | ORAL_TABLET | Freq: Four times a day (QID) | ORAL | Status: DC | PRN

## 2018-04-07 MED ORDER — DIAZEPAM 2 MG PO TABS
2.0000 mg | ORAL_TABLET | ORAL | Status: DC
Start: 2018-04-07 — End: 2018-04-07

## 2018-04-07 MED ORDER — PRAVASTATIN SODIUM 40 MG PO TABS
40.0000 mg | ORAL_TABLET | Freq: Every day | ORAL | Status: DC
  Administered 2018-04-08: 40 mg via ORAL
  Filled 2018-04-07: qty 1

## 2018-04-07 MED ORDER — BRIMONIDINE TARTRATE-TIMOLOL 0.2-0.5 % OP SOLN: 1.0000 [drp] | Freq: Two times a day (BID) | OPHTHALMIC | Status: DC

## 2018-04-07 MED ORDER — PHENYLEPHRINE 40 MCG/ML (10ML) SYRINGE FOR IV PUSH (FOR BLOOD PRESSURE SUPPORT)
PREFILLED_SYRINGE | INTRAVENOUS | Status: DC | PRN
  Administered 2018-04-07: 80 ug via INTRAVENOUS

## 2018-04-07 MED ORDER — DIAZEPAM 2 MG PO TABS
2.0000 mg | ORAL_TABLET | Freq: Two times a day (BID) | ORAL | Status: DC
  Administered 2018-04-08 – 2018-04-09 (×3): 2 mg via ORAL
  Filled 2018-04-07 (×3): qty 1

## 2018-04-07 MED ORDER — DIPHENHYDRAMINE HCL 25 MG PO CAPS: 25.0000 mg | ORAL_CAPSULE | Freq: Four times a day (QID) | ORAL | Status: DC | PRN

## 2018-04-07 MED ORDER — LAMOTRIGINE 25 MG PO TABS
25.0000 mg | ORAL_TABLET | Freq: Every day | ORAL | Status: DC
  Administered 2018-04-08 – 2018-04-09 (×2): 25 mg via ORAL
  Filled 2018-04-07 (×2): qty 1

## 2018-04-07 MED ORDER — ESTROGENS CONJUGATED 0.625 MG PO TABS
0.6250 mg | ORAL_TABLET | Freq: Every day | ORAL | Status: DC
  Administered 2018-04-08 – 2018-04-09 (×2): 0.625 mg via ORAL
  Filled 2018-04-07 (×2): qty 1

## 2018-04-07 MED ORDER — PHENYLEPHRINE 40 MCG/ML (10ML) SYRINGE FOR IV PUSH (FOR BLOOD PRESSURE SUPPORT)
PREFILLED_SYRINGE | INTRAVENOUS | Status: AC
  Filled 2018-04-07: qty 10

## 2018-04-07 MED ORDER — BRIMONIDINE TARTRATE 0.2 % OP SOLN
1.0000 [drp] | Freq: Two times a day (BID) | OPHTHALMIC | Status: DC
  Administered 2018-04-08 – 2018-04-09 (×3): 1 [drp] via OPHTHALMIC
  Filled 2018-04-07: qty 5

## 2018-04-07 MED ORDER — TIMOLOL MALEATE 0.5 % OP SOLN
1.0000 [drp] | Freq: Two times a day (BID) | OPHTHALMIC | Status: DC
Start: 2018-04-07 — End: 2018-04-09
  Administered 2018-04-08 – 2018-04-09 (×3): 1 [drp] via OPHTHALMIC
  Filled 2018-04-07: qty 5

## 2018-04-07 MED ORDER — 0.9 % SODIUM CHLORIDE (POUR BTL) OPTIME
TOPICAL | Status: DC | PRN
  Administered 2018-04-07: 1000 mL

## 2018-04-07 MED ORDER — CONJ ESTROG-MEDROXYPROGEST ACE 0.625-2.5 MG PO TABS: 1.0000 | ORAL_TABLET | Freq: Every day | ORAL | Status: DC

## 2018-04-07 MED ORDER — FLEET ENEMA 7-19 GM/118ML RE ENEM: 1.0000 | ENEMA | Freq: Once | RECTAL | Status: DC | PRN

## 2018-04-07 MED ORDER — BUPROPION HCL ER (XL) 150 MG PO TB24
450.0000 mg | ORAL_TABLET | Freq: Every day | ORAL | Status: DC
  Administered 2018-04-08 – 2018-04-09 (×2): 450 mg via ORAL
  Filled 2018-04-07 (×3): qty 3

## 2018-04-07 MED ORDER — RISAQUAD PO CAPS
1.0000 | ORAL_CAPSULE | Freq: Every day | ORAL | Status: DC
  Administered 2018-04-08 – 2018-04-09 (×2): 1 via ORAL
  Filled 2018-04-07 (×2): qty 1

## 2018-04-07 MED ORDER — METHOCARBAMOL 500 MG PO TABS
500.0000 mg | ORAL_TABLET | Freq: Four times a day (QID) | ORAL | Status: DC | PRN
  Administered 2018-04-08: 500 mg via ORAL
  Filled 2018-04-07: qty 1

## 2018-04-07 MED ORDER — ALUM & MAG HYDROXIDE-SIMETH 200-200-20 MG/5ML PO SUSP: 30.0000 mL | Freq: Four times a day (QID) | ORAL | Status: DC | PRN

## 2018-04-07 MED ORDER — DONEPEZIL HCL 5 MG PO TABS
5.0000 mg | ORAL_TABLET | Freq: Every day | ORAL | Status: DC
  Administered 2018-04-07 – 2018-04-08 (×2): 5 mg via ORAL
  Filled 2018-04-07 (×2): qty 1

## 2018-04-07 MED ORDER — LIDOCAINE-EPINEPHRINE 1 %-1:100000 IJ SOLN
INTRAMUSCULAR | Status: AC
  Filled 2018-04-07: qty 1

## 2018-04-07 MED ORDER — KETOROLAC TROMETHAMINE 15 MG/ML IJ SOLN
INTRAMUSCULAR | Status: AC
  Filled 2018-04-07: qty 1

## 2018-04-07 MED ORDER — LAMOTRIGINE 25 MG PO TABS
50.0000 mg | ORAL_TABLET | Freq: Every day | ORAL | Status: DC
  Administered 2018-04-08: 50 mg via ORAL
  Filled 2018-04-07 (×3): qty 2

## 2018-04-07 MED ORDER — SENNA 8.6 MG PO TABS
1.0000 | ORAL_TABLET | Freq: Two times a day (BID) | ORAL | Status: DC
  Administered 2018-04-07 – 2018-04-09 (×3): 8.6 mg via ORAL
  Filled 2018-04-07 (×3): qty 1

## 2018-04-07 MED ORDER — HYDROCODONE-ACETAMINOPHEN 5-325 MG PO TABS
1.0000 | ORAL_TABLET | ORAL | Status: DC | PRN
  Administered 2018-04-08 (×2): 2 via ORAL
  Administered 2018-04-08: 1 via ORAL
  Administered 2018-04-08 – 2018-04-09 (×2): 2 via ORAL
  Filled 2018-04-07: qty 2
  Filled 2018-04-07: qty 1
  Filled 2018-04-07 (×3): qty 2

## 2018-04-07 MED ORDER — KETOROLAC TROMETHAMINE 15 MG/ML IJ SOLN
7.5000 mg | Freq: Four times a day (QID) | INTRAMUSCULAR | Status: AC
  Administered 2018-04-07 – 2018-04-08 (×4): 7.5 mg via INTRAVENOUS
  Filled 2018-04-07 (×3): qty 1

## 2018-04-07 MED ORDER — MEDROXYPROGESTERONE ACETATE 2.5 MG PO TABS
2.5000 mg | ORAL_TABLET | Freq: Every day | ORAL | Status: DC
  Administered 2018-04-08 – 2018-04-09 (×2): 2.5 mg via ORAL
  Filled 2018-04-07 (×2): qty 1

## 2018-04-07 MED ORDER — DOCUSATE SODIUM 100 MG PO CAPS
100.0000 mg | ORAL_CAPSULE | Freq: Two times a day (BID) | ORAL | Status: DC
  Administered 2018-04-07 – 2018-04-09 (×4): 100 mg via ORAL
  Filled 2018-04-07 (×4): qty 1

## 2018-04-07 MED ORDER — BUPIVACAINE HCL (PF) 0.5 % IJ SOLN
INTRAMUSCULAR | Status: AC
  Filled 2018-04-07: qty 30

## 2018-04-07 MED ORDER — DILTIAZEM HCL ER COATED BEADS 180 MG PO CP24
180.0000 mg | ORAL_CAPSULE | Freq: Every day | ORAL | Status: DC
  Administered 2018-04-08 – 2018-04-09 (×2): 180 mg via ORAL
  Filled 2018-04-07 (×2): qty 1

## 2018-04-07 MED ORDER — MECLIZINE HCL 25 MG PO TABS: 25.0000 mg | ORAL_TABLET | Freq: Two times a day (BID) | ORAL | Status: DC | PRN

## 2018-04-07 SURGICAL SUPPLY — 45 items
BAG DECANTER FOR FLEXI CONT (MISCELLANEOUS) ×3 IMPLANT
BLADE CLIPPER SURG (BLADE) IMPLANT
BUR ACORN 6.0 (BURR) ×2 IMPLANT
BUR ACORN 6.0MM (BURR) ×1
BUR MATCHSTICK NEURO 3.0 LAGG (BURR) ×3 IMPLANT
CANISTER SUCT 3000ML PPV (MISCELLANEOUS) ×3 IMPLANT
DECANTER SPIKE VIAL GLASS SM (MISCELLANEOUS) ×3 IMPLANT
DERMABOND ADVANCED (GAUZE/BANDAGES/DRESSINGS) ×2
DERMABOND ADVANCED .7 DNX12 (GAUZE/BANDAGES/DRESSINGS) ×1 IMPLANT
DEVICE DISSECT PLASMABLAD 3.0S (MISCELLANEOUS) ×1 IMPLANT
DRAPE HALF SHEET 40X57 (DRAPES) IMPLANT
DRAPE LAPAROTOMY 100X72X124 (DRAPES) ×3 IMPLANT
DRAPE MICROSCOPE LEICA (MISCELLANEOUS) ×3 IMPLANT
DURAPREP 26ML APPLICATOR (WOUND CARE) ×3 IMPLANT
ELECT REM PT RETURN 9FT ADLT (ELECTROSURGICAL) ×3
ELECTRODE REM PT RTRN 9FT ADLT (ELECTROSURGICAL) ×1 IMPLANT
FLOSEAL 5ML (HEMOSTASIS) ×3 IMPLANT
GAUZE 4X4 16PLY RFD (DISPOSABLE) IMPLANT
GAUZE SPONGE 4X4 12PLY STRL (GAUZE/BANDAGES/DRESSINGS) ×3 IMPLANT
GLOVE BIOGEL PI IND STRL 8.5 (GLOVE) ×1 IMPLANT
GLOVE BIOGEL PI INDICATOR 8.5 (GLOVE) ×2
GLOVE ECLIPSE 8.5 STRL (GLOVE) ×3 IMPLANT
GOWN STRL REUS W/ TWL LRG LVL3 (GOWN DISPOSABLE) IMPLANT
GOWN STRL REUS W/ TWL XL LVL3 (GOWN DISPOSABLE) IMPLANT
GOWN STRL REUS W/TWL 2XL LVL3 (GOWN DISPOSABLE) ×3 IMPLANT
GOWN STRL REUS W/TWL LRG LVL3 (GOWN DISPOSABLE)
GOWN STRL REUS W/TWL XL LVL3 (GOWN DISPOSABLE)
KIT BASIN OR (CUSTOM PROCEDURE TRAY) ×3 IMPLANT
KIT TURNOVER KIT B (KITS) ×3 IMPLANT
NEEDLE HYPO 22GX1.5 SAFETY (NEEDLE) ×3 IMPLANT
NEEDLE SPNL 20GX3.5 QUINCKE YW (NEEDLE) ×3 IMPLANT
NS IRRIG 1000ML POUR BTL (IV SOLUTION) ×3 IMPLANT
PACK LAMINECTOMY NEURO (CUSTOM PROCEDURE TRAY) ×3 IMPLANT
PAD ARMBOARD 7.5X6 YLW CONV (MISCELLANEOUS) ×9 IMPLANT
PATTIES SURGICAL .5 X1 (DISPOSABLE) IMPLANT
PLASMABLADE 3.0S (MISCELLANEOUS) ×3
RUBBERBAND STERILE (MISCELLANEOUS) ×6 IMPLANT
SPONGE SURGIFOAM ABS GEL SZ50 (HEMOSTASIS) IMPLANT
SUT VIC AB 1 CT1 18XBRD ANBCTR (SUTURE) ×1 IMPLANT
SUT VIC AB 1 CT1 8-18 (SUTURE) ×2
SUT VIC AB 2-0 CP2 18 (SUTURE) ×6 IMPLANT
SUT VIC AB 3-0 SH 8-18 (SUTURE) ×3 IMPLANT
TOWEL GREEN STERILE (TOWEL DISPOSABLE) ×3 IMPLANT
TOWEL GREEN STERILE FF (TOWEL DISPOSABLE) ×3 IMPLANT
WATER STERILE IRR 1000ML POUR (IV SOLUTION) ×3 IMPLANT

## 2018-04-07 NOTE — Progress Notes (Signed)
Dr. Ellene Route saw pt at bedside. Pts complains of no feeling in legs bilaterally and can minimally move toes bilaterally. MD at bedside to observe these findings. No new orders given. Will continue to monitor.

## 2018-04-07 NOTE — H&P (Signed)
  CHIEF COMPLAINT: Back pain, right buttock and leg pain.  HISTORY OF PRESENT ILLNESS: Ms. Bethany Oconnor is a 77 year old left-handed individual whom I have seen in the past, advising her regarding treatment of an anterior communicating artery aneurysm.  She tells me that she has had low back pain for some time, but mostly it seemed to get worse in March of this year.  She is seen and treated by Irwin Brakeman, who does therapeutic yoga with her, and she notes that she has had a considerable amount of centralized low back pain with a lot of radiation into the buttock and right lower extremity.  She has been seen by Dr. Sharyne Richters, and ultimately she had a singular epidural steroid injection performed by Laurence Spates.  She notes the injection did not give her substantial relief.  She had an MRI of the lumbar spine that was performed in April of this year, which demonstrates that she has multilevel disc degenerative changes with a modest degenerative scoliosis.  There is, however, significant foraminal stenosis on the right side at L4-L5 with a moderate protrusion of the disc in the extraforaminal zone, and she also has a herniated nucleus pulposus at L5-S1 that elevates and possibly compresses the takeoff of the S1 nerve root in the foramen.  She notes that the pain has been worsening despite efforts at conservative management, and she would like to know if something more definitive can be done to treat this process.  PAST MEDICAL HISTORY: Reveals that she has significant deterioration of vision, significant difficulty with her hearing capacity.     She is on a substantial list of medications including some Calcium/ Vitamin D supplementation, Diltiazem, Wellbutrin, Memantine, Prempro, Multivitamin, Omeprazole, Align, PreserVision AREDS capsules, Ondansetron tablets, Ibuprofen, Benadryl, Robitussin, Cosopt, Latanoprost, Meclizine, in addition to Lorazepam, Lamictal and Pravastatin, Donepezil, Lorazepam at  bedtime, and Melatonin.  SYSTEMS REVIEW: Notable for leg pain while walking, back pain, arm pain, arm weakness, leg pain, joint pain, swelling, difficulty with coordination in her arms and her legs, anxiety, depression, some cataracts, the hearing loss and balance disturbance on a 14-point review sheet.  EXAMINATION: Today, I note that she stands straight and erect without too much difficulty.  She ambulates independently, but feels an unsteadiness on her feet.  She has good strength in the gastrocs, bilaterally, but her tibialis anterior on that right side is moderately weakened to 4/5.  Deep tendon reflexes are absent in both the patellae and the Achilles.  She is acutely tender to palpation over the posterior suprailiac crest on the right side, suggesting considerable sensitivity in the gluteus muscle itself.  IMPRESSION: The patient has evidence of significant degenerative spondylosis with a mild scoliosis at the levels of L4-5 and L5-S1.  She has a significant right lumbar radiculopathy seemingly in an L5 distribution. The bulk of her pathology centers about L4-5 and L5-S1, and I have advised decompression at these levels via laminotomy ,foraminotomy and possibly discectomy.

## 2018-04-07 NOTE — Anesthesia Postprocedure Evaluation (Signed)
Anesthesia Post Note  Patient: Bethany Oconnor  Procedure(s) Performed: Right Lumbar Four-Five Lumbar Five-Sacral One Laminectomy/Microdiscectomy (Right Back)     Patient location during evaluation: PACU Anesthesia Type: General Level of consciousness: awake Pain management: pain level controlled Vital Signs Assessment: post-procedure vital signs reviewed and stable Respiratory status: spontaneous breathing Cardiovascular status: stable Anesthetic complications: no    Last Vitals:  Vitals:   04/07/18 1731 04/07/18 1800  BP: (!) 161/83 111/77  Pulse: 71 73  Resp: 13 13  Temp:    SpO2: 92% 95%    Last Pain:  Vitals:   04/07/18 1700  TempSrc:   PainSc: Asleep                 Lovel Suazo

## 2018-04-07 NOTE — Anesthesia Procedure Notes (Signed)
Procedure Name: Intubation Date/Time: 04/07/2018 3:36 PM Performed by: Teressa Lower., CRNA Pre-anesthesia Checklist: Patient identified, Emergency Drugs available, Suction available and Patient being monitored Patient Re-evaluated:Patient Re-evaluated prior to induction Oxygen Delivery Method: Circle system utilized Preoxygenation: Pre-oxygenation with 100% oxygen Induction Type: IV induction Ventilation: Mask ventilation without difficulty Laryngoscope Size: Mac and 3 Grade View: Grade I Tube type: Oral Tube size: 7.0 mm Number of attempts: 1 Airway Equipment and Method: Stylet and Oral airway Placement Confirmation: ETT inserted through vocal cords under direct vision,  positive ETCO2 and breath sounds checked- equal and bilateral Secured at: 21 cm Tube secured with: Tape Dental Injury: Teeth and Oropharynx as per pre-operative assessment

## 2018-04-07 NOTE — Progress Notes (Signed)
Care of pt assumed by [facility]. Dr Ellene Route here-per Gladys Damme RN (prev. Primary RN) pt showed 1L urine by bladder scan. Pt wants a foley-new order for foley cath per Dr Ellene Route. Pt minimally moving bil. LE's and says "I can't feel my feet". Dr Ellene Route here & aware.

## 2018-04-07 NOTE — Anesthesia Preprocedure Evaluation (Addendum)
Anesthesia Evaluation  Patient identified by MRN, date of birth, ID band Patient awake    Reviewed: Allergy & Precautions, NPO status , Patient's Chart, lab work & pertinent test results  History of Anesthesia Complications (+) PONV and history of anesthetic complications  Airway Mallampati: III  TM Distance: >3 FB Neck ROM: Full    Dental no notable dental hx. (+) Teeth Intact   Pulmonary pneumonia, resolved, former smoker,    Pulmonary exam normal breath sounds clear to auscultation       Cardiovascular hypertension, Pt. on medications + Peripheral Vascular Disease  Normal cardiovascular exam+ dysrhythmias Atrial Fibrillation  Rhythm:Regular Rate:Normal  Hx/o cerebral aneurysm S/P coiling 2008   Neuro/Psych PSYCHIATRIC DISORDERS Anxiety Depression Dementia Macular degeneration- legally blind Peripheral neuropathy    GI/Hepatic negative GI ROS, Neg liver ROS,   Endo/Other  negative endocrine ROS  Renal/GU negative Renal ROS  negative genitourinary   Musculoskeletal  (+) Arthritis , Osteoarthritis,  HNP L4-5 and L5-S1 Low back pain with right sided radiculopathy   Abdominal   Peds  Hematology Hx/o CLL   Anesthesia Other Findings   Reproductive/Obstetrics                            Anesthesia Physical Anesthesia Plan  ASA: III  Anesthesia Plan: General   Post-op Pain Management:    Induction: Intravenous  PONV Risk Score and Plan: 4 or greater and Ondansetron, Dexamethasone and Treatment may vary due to age or medical condition  Airway Management Planned: Oral ETT  Additional Equipment:   Intra-op Plan:   Post-operative Plan: Extubation in OR  Informed Consent: I have reviewed the patients History and Physical, chart, labs and discussed the procedure including the risks, benefits and alternatives for the proposed anesthesia with the patient or authorized representative who  has indicated his/her understanding and acceptance.   Dental advisory given  Plan Discussed with: CRNA and Surgeon  Anesthesia Plan Comments:         Anesthesia Quick Evaluation

## 2018-04-07 NOTE — Transfer of Care (Signed)
Immediate Anesthesia Transfer of Care Note  Patient: Bethany Oconnor  Procedure(s) Performed: Right Lumbar Four-Five Lumbar Five-Sacral One Laminectomy/Microdiscectomy (Right Back)  Patient Location: PACU  Anesthesia Type:General  Level of Consciousness: drowsy and patient cooperative  Airway & Oxygen Therapy: Patient Spontanous Breathing and Patient connected to face mask oxygen  Post-op Assessment: Report given to RN and Post -op Vital signs reviewed and stable  Post vital signs: Reviewed and stable  Last Vitals:  Vitals Value Taken Time  BP 157/72 04/07/2018  5:01 PM  Temp    Pulse 73 04/07/2018  5:04 PM  Resp 19 04/07/2018  5:04 PM  SpO2 100 % 04/07/2018  5:04 PM  Vitals shown include unvalidated device data.  Last Pain:  Vitals:   04/07/18 1113  TempSrc:   PainSc: 0-No pain      Patients Stated Pain Goal: 3 (76/14/70 [id number])  Complications: No apparent anesthesia complications

## 2018-04-07 NOTE — Op Note (Signed)
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 Surgeon: Kristeen Miss First assistant: None Anesthesia: General endotracheal Indications: The patient is a 77 year old individuals had significant back right lower extremity pain.  She has had an MRI that demonstrates a partially calcified disc at L5-S1 in addition to a lateral disc herniation at L4-L5.  She is advised regarding surgical decompression.  Procedure: Patient was brought to the operating room supine on the stretcher.  After the smooth induction of general endotracheal anesthesia, she was turned prone.  Back was prepped with alcohol DuraPrep and draped in a sterile fashion.  Midline incision was created and carried down to the lumbodorsal fashion first identifiable disc space was noted to be that of L4-L5.  Laminotomy was then created with a high-speed drill to remove the inferior margin lamina of L4 out to the medial wall of the facet.  Partial medial facetectomy was performed.  Common dural tube was explored takeoff the L5 nerve root was noted to be bowed dorsally under a significant mass.  Mass was found to be a herniated nucleus pulposus and this was incised with a #15 blade and a combination of curettes and rongeurs was used to evacuate a modest amount of moderately degenerated disc material.  Some bony osteophytosis in this region was also taken down this allowed for good passage of the L5 nerve root inferiorly.  Hemostasis was then carefully achieved the disc space was explored and several other fragments of disc material from within the disc space were resected until no other fragments could be retrieved.  The area was irrigated copiously.  Attention was then turned to L5-S1 where a similar  procedure was carried out however here there was a large calcified disc herniation and this was resected in a piecemeal fashion.  The disc space was also entered and a thorough discectomy was performed.  In the end the pads of both the L5 nerve root superiorly the L5 nerve root inferiorly in the lateral recess and the S1 nerve root at the L5-S1 junction was well decompressed.  Hemostasis was doubly checked.  The area was copiously irrigated with antibiotic irrigating solution and then the retractor was removed and the lumbodorsal fascia was closed with #1 Vicryl 2-0 Vicryl the sub-cutaneous fashion.  Subcuticular  skin was closed with 3-0 Vicryl.  Blood loss is estimated at less than 100 cc the patient tolerated procedure well 20 cc of half percent Marcaine was injected into the paraspinous fascia at the closure.

## 2018-04-08 ENCOUNTER — Encounter (HOSPITAL_COMMUNITY): Payer: Self-pay | Admitting: Neurological Surgery

## 2018-04-08 DIAGNOSIS — C9190 Lymphoid leukemia, unspecified not having achieved remission: Secondary | ICD-10-CM

## 2018-04-08 DIAGNOSIS — I480 Paroxysmal atrial fibrillation: Secondary | ICD-10-CM

## 2018-04-08 DIAGNOSIS — H3530 Unspecified macular degeneration: Secondary | ICD-10-CM

## 2018-04-08 DIAGNOSIS — M5127 Other intervertebral disc displacement, lumbosacral region: Secondary | ICD-10-CM

## 2018-04-08 DIAGNOSIS — M545 Low back pain, unspecified: Secondary | ICD-10-CM

## 2018-04-08 DIAGNOSIS — G8929 Other chronic pain: Secondary | ICD-10-CM

## 2018-04-08 DIAGNOSIS — I671 Cerebral aneurysm, nonruptured: Secondary | ICD-10-CM | POA: Diagnosis not present

## 2018-04-08 DIAGNOSIS — M5116 Intervertebral disc disorders with radiculopathy, lumbar region: Secondary | ICD-10-CM | POA: Diagnosis not present

## 2018-04-08 DIAGNOSIS — D62 Acute posthemorrhagic anemia: Secondary | ICD-10-CM

## 2018-04-08 NOTE — Progress Notes (Signed)
Patient ID: Bethany Oconnor, female   DOB: 21-Dec-1940, 77 y.o.   MRN: [id number] Patient feels that legs feel weaker this morning.  Motor function appears to be intact though she seems somewhat reluctant to move.  This may be an effect from Marcaine that was injected into the paraspinous fascia that may have caused some local spinal spread.  I believe she will be more mobile this afternoon.  She is concerned about her overall mobility.  She would like to consider rehab here at the hospital before being discharged to [facility].  I will order rehab med consult today.

## 2018-04-08 NOTE — Evaluation (Signed)
Occupational Therapy Evaluation Patient Details Name: Bethany Oconnor MRN: [id number] DOB: 02-21-1941 Today's Date: 04/08/2018    History of Present Illness Pt is 77 yo female s/p lumbar laminectomy/decompression microdiscectomy 2 levels. PMH includes: anterior communicating artery aneurysm, significant deterioration of vision, significant difficulty with her hearing capacity.    Clinical Impression   Pt admitted with the above diagnoses and presents with below problem list. Pt will benefit from continued acute OT to address the below listed deficits and maximize independence with basic ADLs prior to d/c to venue below. PTA pt was supervision level with functional transfers/mobility using a rollator. Pt reports, "I pay a lot of money to have an aide with me all the time so I let them bathe and dress me." Pt able to raise feet to access in seated position for LB ADLs. Min guard for functional transfers/mobility. Discussed ADL strategies while recovering from back surgery and encouraged functional independence.    Follow Up Recommendations  SNF;Supervision/Assistance - 24 hour(return to [facility] with continued OT)    Equipment Recommendations  None recommended by OT    Recommendations for Other Services PT consult     Precautions / Restrictions Precautions Precautions: Fall;Back Precaution Booklet Issued: Yes (comment) Precaution Comments: verbally reviewed BLT precautions and ADL strategies Required Braces or Orthoses: (no brace needed) Restrictions Weight Bearing Restrictions: No      Mobility Bed Mobility               General bed mobility comments: up in chair  Transfers Overall transfer level: Needs assistance Equipment used: Rolling walker (2 wheeled) Transfers: Sit to/from Stand Sit to Stand: Min guard         General transfer comment: to/from recliner and comfort height toilet. Cues for technique.     Balance Overall balance assessment: Needs  assistance Sitting-balance support: No upper extremity supported;Feet supported Sitting balance-Leahy Scale: Fair     Standing balance support: Bilateral upper extremity supported Standing balance-Leahy Scale: Poor Standing balance comment: fair static, poor dynamic. rollator at baseline                           ADL either performed or assessed with clinical judgement   ADL Overall ADL's : Needs assistance/impaired Eating/Feeding: Set up;Sitting   Grooming: Set up;Sitting   Upper Body Bathing: Set up;Sitting;Min guard   Lower Body Bathing: Min guard;Sit to/from stand;Cueing for compensatory techniques   Upper Body Dressing : Min guard;Set up;Cueing for compensatory techniques;Sitting   Lower Body Dressing: Min guard;Cueing for compensatory techniques;Cueing for back precautions;Sit to/from stand   Toilet Transfer: Min guard;Ambulation;Comfort height toilet;RW;Grab bars   Toileting- Clothing Manipulation and Hygiene: Min guard;Sit to/from stand;Cueing for compensatory techniques;Cueing for back precautions   Tub/ Shower Transfer: Walk-in shower;Min guard;Ambulation;Shower seat;Rolling walker   Functional mobility during ADLs: Min guard;Rolling walker General ADL Comments: Pt completed toilet transfer, anterior pericare, and in-room functional mobility. Aide present and included in education.      Vision         Perception     Praxis      Pertinent Vitals/Pain Pain Assessment: Faces Faces Pain Scale: Hurts a little bit Pain Location: back Pain Descriptors / Indicators: Sore Pain Intervention(s): Monitored during session;Repositioned     Hand Dominance     Extremity/Trunk Assessment Upper Extremity Assessment Upper Extremity Assessment: Overall WFL for tasks assessed   Lower Extremity Assessment Lower Extremity Assessment: Defer to PT evaluation   Cervical / Trunk  Assessment Cervical / Trunk Assessment: Other exceptions(s/p back surgery)    Communication Communication Communication: HOH   Cognition Arousal/Alertness: Awake/alert Behavior During Therapy: WFL for tasks assessed/performed Overall Cognitive Status: Within Functional Limits for tasks assessed                                 General Comments: verbal perseveration noted   General Comments       Exercises     Shoulder Instructions      Home Living Family/patient expects to be discharged to:: Skilled nursing facility Living Arrangements: Other (Comment)                               Additional Comments: Pt is from [place] and has 24/7 aide. Has shower seat and grab bars as well as rollator      Prior Functioning/Environment Level of Independence: Independent with assistive device(s);Needs assistance  Gait / Transfers Assistance Needed: ambulates with rollator and supervision ADL's / Homemaking Assistance Needed: "I pay a lot of money to have 24/7 help so I let them bathe and dress me."             OT Problem List: Impaired balance (sitting and/or standing);Decreased knowledge of use of DME or AE;Decreased knowledge of precautions;Pain      OT Treatment/Interventions: Self-care/ADL training;DME and/or AE instruction;Therapeutic activities;Patient/family education;Balance training    OT Goals(Current goals can be found in the care plan section) Acute Rehab OT Goals Patient Stated Goal: back to [facility] OT Goal Formulation: With patient Time For Goal Achievement: 04/15/18 Potential to Achieve Goals: Good ADL Goals Pt Will Perform Grooming: with set-up;standing;with supervision Pt Will Perform Lower Body Bathing: with supervision;sit to/from stand;with set-up Pt Will Perform Lower Body Dressing: with supervision;sit to/from stand;with set-up Pt Will Transfer to Toilet: with supervision;ambulating Pt Will Perform Toileting - Clothing Manipulation and hygiene: with set-up;with supervision;sit to/from stand;with  adaptive equipment Pt Will Perform Tub/Shower Transfer: Shower transfer;with supervision;ambulating;shower seat;rolling walker  OT Frequency: Min 2X/week   Barriers to D/C:            Co-evaluation              AM-PAC PT "6 Clicks" Daily Activity     Outcome Measure Help from another person eating meals?: None Help from another person taking care of personal grooming?: A Little Help from another person toileting, which includes using toliet, bedpan, or urinal?: A Little Help from another person bathing (including washing, rinsing, drying)?: A Little Help from another person to put on and taking off regular upper body clothing?: A Little Help from another person to put on and taking off regular lower body clothing?: A Little 6 Click Score: 19   End of Session Equipment Utilized During Treatment: Rolling walker Nurse Communication: Mobility status  Activity Tolerance: Patient tolerated treatment well Patient left: in chair;with call bell/phone within reach;Other (comment);with family/visitor present([facility] aide present throughout)  OT Visit Diagnosis: Other abnormalities of gait and mobility (R26.89);Pain;Low vision, both eyes (H54.2)                Time: 8101-7510 OT Time Calculation (min): 25 min Charges:  OT General Charges $OT Visit: 1 Visit OT Evaluation $OT Eval Low Complexity: 1 Low OT Treatments $Self Care/Home Management : 8-22 mins   Hortencia Pilar 04/08/2018, 12:52 PM

## 2018-04-08 NOTE — Consult Note (Signed)
Physical Medicine and Rehabilitation Consult   Reason for Consult: HNP lumbar spine with radiculopathy.  Referring Physician: Dr. Ellene Route.    HPI: Bethany Oconnor is a 77 y.o. 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 She was admitted on 04/07/18 for microdiskectomy with decompression of L5 and S1 nerve roots by Dr. Ellene Route. Post op reporting BLE weakness felt to be due to anesthesic effect from marcaine.  CIR recommended for follow up therapy.   Lives at [place] and has sitters 24/7. Was able to ambulate with walker and supervision PTA.  She would prefer rehab at [facility] as its closer to her home.    Review of Systems  Constitutional: Negative for chills and fever.  HENT: Positive for hearing loss.   Eyes: Positive for blurred vision (legally blind).       Poor vision  Respiratory: Negative for shortness of breath.   Cardiovascular: Negative for chest pain and palpitations.  Gastrointestinal: Negative for abdominal pain, constipation, heartburn and nausea.  Genitourinary: Positive for frequency and urgency (urinates every hour/has seen GU in the past).  Neurological: Positive for dizziness, sensory change and focal weakness.  Psychiatric/Behavioral: Negative for depression and hallucinations. The patient is not nervous/anxious.   All other systems reviewed and are negative.     Past Medical History:  Diagnosis Date  . Anxiety   . Arthritis   . Cerebral aneurysm    s/p endovascular colling 2008  . CLL (chronic lymphocytic leukemia) ([facility])   . Depression   . Hearing loss   . Macular degeneration   . Neuropathy   . Paroxysmal atrial fibrillation ([facility])   . Pneumonia    08-04-17  . PONV (postoperative nausea and vomiting)   . Thyroid disease     Past Surgical History:  Procedure Laterality Date  . ANEURYSM COILING    . CATARACT EXTRACTION     bilateral  . CERVICAL CONE BIOPSY    . COLONOSCOPY    . EVALUATION UNDER ANESTHESIA WITH HEMORRHOIDECTOMY N/A 09/03/2017   Procedure: EXAM UNDER ANESTHESIA, EXCISION OF ANAL LESION, POSSIBLE HEMORRHOIDECTOMY;  Surgeon: Ileana Roup, MD;  Location: [facility];  Service: General;  Laterality: N/A;  . LUMBAR LAMINECTOMY/DECOMPRESSION MICRODISCECTOMY Right 04/07/2018   Procedure: Right Lumbar Four-Five Lumbar Five-Sacral One Laminectomy/Microdiscectomy;  Surgeon: Kristeen Miss, MD;  Location: [place];  Service: Neurosurgery;  Laterality: Right;  Right Lumbar Four-Five Lumbar Five-Sacral One Laminectomy/Microdiscectomy  . THYROIDECTOMY    . TONSILLECTOMY      Family History  Problem Relation Age of Onset  . Cancer Father 22       died  . Cerebral aneurysm Mother 45       died  . Heart attack Brother     Social History:  Widowed. Lives at [place] with 24/7 sitters. She  reports that she quit smoking about 29 years ago. She has never used smokeless tobacco. She quit alcohol use last year. She reports that she does not drink use drugs.    Allergies  Allergen Reactions  . Ambien [Zolpidem Tartrate] Other (See Comments)    UNSPECIFIED REACTION   . Fioricet-Codeine [Butalbital-Apap-Caff-Cod]     UNSPECIFIED REACTION   . Macrobid [Nitrofurantoin]     UNSPECIFIED REACTION   . Percocet [Oxycodone-Acetaminophen] Nausea And Vomiting  . Codeine Other (See Comments)    About 50 years ago, took Codeine for scratched cornea  while pregnant, and it made her extremely sedated.  Marland Kitchen Keflex [Cephalexin] Rash    Facility-Administered Medications Prior to Admission  Medication Dose Route Frequency Provider Last Rate Last Dose  . oxyCODONE (OXYCONTIN) 12 hr tablet 10 mg  10 mg Oral Q12H Jessy Oto, MD       Medications Prior to Admission  Medication Sig Dispense Refill  . acetaminophen (TYLENOL) 500 MG tablet Take 500-1,000 mg by mouth every 8 (eight) hours as needed for moderate pain or  headache.    Marland Kitchen buPROPion (WELLBUTRIN XL) 300 MG 24 hr tablet Take 450 mg by mouth daily.     . Calcium Carbonate-Vitamin D 600-400 MG-UNIT per tablet Take 1 tablet by mouth daily.    . COMBIGAN 0.2-0.5 % ophthalmic solution Place 1 drop into the left eye 2 (two) times daily.     . diazepam (VALIUM) 2 MG tablet Take 2-4 mg by mouth See admin instructions. Take 2 mg by mouth in the morning and take 2 mg by mouth mid-afternoon. Take 4 mg by mouth at bedtime.    Marland Kitchen diltiazem (CARDIZEM CD) 180 MG 24 hr capsule Take 180 mg by mouth daily.    Marland Kitchen donepezil (ARICEPT) 5 MG tablet Take 5 mg by mouth at bedtime.    . dorzolamide-timolol (COSOPT) 22.3-6.8 MG/ML ophthalmic solution Place 1 drop into the left eye 2 (two) times daily.    Marland Kitchen lamoTRIgine (LAMICTAL) 25 MG tablet Take 25-50 mg by mouth See admin instructions. Take 25 mg by mouth in the morning and take 50 mg by mouth at 1800-1900 for mood    . latanoprost (XALATAN) 0.005 % ophthalmic solution Place 1 drop into the left eye at bedtime.     . meclizine (ANTIVERT) 25 MG tablet Take 25 mg by mouth 2 (two) times daily as needed for dizziness.    . Melatonin 5 MG TABS Take 5 mg by mouth at bedtime.    . memantine (NAMENDA) 5 MG tablet Take 5 mg by mouth daily.    . Multiple Vitamins-Minerals (MULTIVITAMINS THER. W/MINERALS) TABS tablet Take 1 tablet by mouth daily.    . Multiple Vitamins-Minerals (PRESERVISION AREDS) CAPS Take 1 capsule daily by mouth.    Marland Kitchen omeprazole (PRILOSEC) 20 MG capsule Take 20 mg by mouth daily.    . pravastatin (PRAVACHOL) 40 MG tablet Take 40 mg by mouth daily.     Marland Kitchen PREMPRO 0.625-2.5 MG tablet Take 1 tablet by mouth daily.   3  . Probiotic Product (ALIGN) 4 MG CAPS Take 4 mg by mouth daily.     . diphenhydrAMINE (BENADRYL) 25 mg capsule Take 25 mg by mouth every 6 (six) hours as needed for itching.    . gabapentin (NEURONTIN) 100 MG capsule Take 1 capsule (100 mg total) by mouth 2 (two) times daily for 3 days. (Patient not taking:  Reported on 04/03/2018) 60 capsule 0  . hydrocortisone (ANUSOL-HC) 25 MG suppository Place 25 mg rectally 2 (two) times daily as needed for hemorrhoids or anal itching.    Marland Kitchen ibuprofen (ADVIL,MOTRIN) 400 MG tablet Take 1 tablet (400 mg total) by mouth every 8 (eight) hours as needed for moderate pain. 60 tablet 0  . ondansetron (ZOFRAN) 4 MG tablet Take 4 mg by mouth every 4 (four) hours as needed for nausea or vomiting.    . phenylephrine-shark liver oil-mineral oil-petrolatum (PREPARATION H) 0.25-3-14-71.9 % rectal ointment Place 1 application rectally 4 (four) times daily as needed for hemorrhoids.  Home: Home Living Family/patient expects to be discharged to:: Skilled nursing facility Living Arrangements: Other (Comment)  Functional History:   Functional Status:  Mobility:          ADL:    Cognition: Cognition Orientation Level: Oriented to person, Oriented to place, Oriented to time, Oriented to situation    Blood pressure (!) 146/72, pulse 87, temperature 97.9 F (36.6 C), temperature source Oral, resp. rate 16, height 5\' 5"  (1.651 m), weight 72.6 kg (160 lb), SpO2 95 %. Physical Exam  Nursing note and vitals reviewed. Constitutional: She appears well-developed and well-nourished.  Frustrated Patient states she cannot cc anything and is awaiting someone to repeat her food. However, she notices and is able to track people entering the room.  HENT:  Head: Normocephalic and atraumatic.  Eyes: EOM are normal. Right eye exhibits no discharge. Left eye exhibits no discharge.  Neck: Normal range of motion. Neck supple.  Cardiovascular:  Irregularly irregular  Respiratory: Effort normal and breath sounds normal.  GI: Soft. Bowel sounds are normal.  Musculoskeletal: She exhibits no edema or tenderness.  Neurological: She is alert.  Motor: Grossly 4-/5 throughout (?participation).  Skin: Skin is warm and dry.  Psychiatric: Her speech is normal. Her affect is angry. She is  agitated.    Results for orders placed or performed during the hospital encounter of 04/07/18 (from the past 24 hour(s))  CBC     Status: Abnormal   Collection Time: 04/07/18 10:53 AM  Result Value Ref Range   WBC 35.0 (H) 4.0 - 10.5 K/uL   RBC 4.04 3.87 - 5.11 MIL/uL   Hemoglobin 13.4 12.0 - 15.0 g/dL   HCT 41.3 36.0 - 46.0 %   MCV 102.2 (H) 78.0 - 100.0 fL   MCH 33.2 26.0 - 34.0 pg   MCHC 32.4 30.0 - 36.0 g/dL   RDW 13.9 11.5 - 15.5 %   Platelets 206 150 - 400 K/uL  Basic metabolic panel     Status: Abnormal   Collection Time: 04/07/18 10:53 AM  Result Value Ref Range   Sodium 144 135 - 145 mmol/L   Potassium 4.3 3.5 - 5.1 mmol/L   Chloride 108 98 - 111 mmol/L   CO2 26 22 - 32 mmol/L   Glucose, Bld 89 70 - 99 mg/dL   BUN 11 8 - 23 mg/dL   Creatinine, Ser 0.95 0.44 - 1.00 mg/dL   Calcium 9.0 8.9 - 10.3 mg/dL   GFR calc non Af Amer 55 (L) >60 mL/min   GFR calc Af Amer >60 >60 mL/min   Anion gap 10 5 - 15   Dg Lumbar Spine 2-3 Views  Result Date: 04/07/2018 CLINICAL DATA:  77 year old female for L4-5 laminectomy. Initial encounter. EXAM: LUMBAR SPINE - 2-3 VIEW COMPARISON:  12/21/2017 MR. FINDINGS: Two intraoperative lateral views of the lumbar spine submitted for review. Level assignment as per prior MR. First film (04/07/2018 3:50 p.m.) reveals metallic probe along the inferior aspect of the L4 spinous process. Second film (04/07/2018 3:56 p.m.) reveals metallic probe directed towards the L4-5 disc space. Surgical sponge in place. IMPRESSION: Localization L4-5. Electronically Signed   By: Genia Del M.D.   On: 04/07/2018 16:31    Assessment/Plan: Diagnosis: HNP lumbar spine with radiculopathy s/p decompression. Labs independently reviewed.  Records reviewed and summated above.  1. Does the need for close, 24 hr/day medical supervision in concert with the patient's rehab needs make it unreasonable for this patient to be served in a  less intensive setting? No   2. Co-Morbidities requiring supervision/potential complications: cerebral aneurysm s/p coiling, CLL, PAF (continue meds, monitor heart rate with increased mobility), macular degeneration, neuropathy, acute on chronic back pain (Biofeedback training with therapies to help reduce reliance on IV and opiate pain medications, particularly IV toradol, monitor pain control during therapies, and sedation at rest and titrate to maximum efficacy to ensure participation and gains in therapies) 3. Due to safety, skin/wound care, disease management, pain management and patient education, does the patient require 24 hr/day rehab nursing? Yes 4. Does the patient require coordinated care of a physician, rehab nurse, PT (1-2 hrs/day, 5 days/week) and OT (1-2 hrs/day, 5 days/week) to address physical and functional deficits in the context of the above medical diagnosis(es)? Yes Addressing deficits in the following areas: balance, endurance, locomotion, strength, transferring, bathing, dressing, feeding, grooming, toileting and psychosocial support 5. Can the patient actively participate in an intensive therapy program of at least 3 hrs of therapy per day at least 5 days per week? Yes 6. The potential for patient to make measurable gains while on inpatient rehab is NA 7. Anticipated functional outcomes upon discharge from inpatient rehab are n/a  with PT, n/a with OT, n/a with SLP. 8. Estimated rehab length of stay to reach the above functional goals is: NA 9. Anticipated D/C setting: Other 10. Anticipated post D/C treatments: SNF 11. Overall Rehab/Functional Prognosis: good  RECOMMENDATIONS: This patient's condition is appropriate for continued rehabilitative care in the following setting: Patient refusing CIR, stating she intends to go back to her SNF. Given functional status believe this is appropriate. Patient has agreed to participate in recommended program. Potentially Note that insurance prior authorization may  be required for reimbursement for recommended care.  Comment: Rehab Admissions Coordinator to follow up.   I have personally performed a face to face diagnostic evaluation, including, but not limited to relevant history and physical exam findings, of this patient and developed relevant assessment and plan.  Additionally, I have reviewed and concur with the physician assistant's documentation above.   Delice Lesch, MD, ABPMR Bary Leriche, PA-C 04/08/2018

## 2018-04-08 NOTE — Social Work (Signed)
CSW acknowledging consult for SNF placement, when pt able to work with therapies will support recommended disposition. Pt will need BCBS Medicare approval if SNF recommended.   Alexander Mt, [place] Work [phone]

## 2018-04-08 NOTE — Evaluation (Signed)
Physical Therapy Evaluation Patient Details Name: Bethany Oconnor MRN: [id number] DOB: 1941/06/30 Today's Date: 04/08/2018   History of Present Illness  Pt is 77 yo female s/p lumbar laminectomy/decompression microdiscectomy 2 levels. PMH includes: anterior communicating artery aneurysm, significant deterioration of vision, significant difficulty with her hearing capacity.   Clinical Impression  Orders received for PT evaluation. Patient demonstrates deficits in functional mobility as indicated below. Will benefit from continued skilled PT to address deficits and maximize function. Will see as indicated and progress as tolerated.  Patient mobilizing well, but very self limiting and distracted. Max cues to direct to task. Overall, does not require a lot of assist, (min guard to min assist). Recommend return to [facility] with assist/PT follow up.    Follow Up Recommendations SNF(Return to [facility] with PT follow up)    Equipment Recommendations  None recommended by PT    Recommendations for Other Services       Precautions / Restrictions Precautions Precautions: Fall;Back Precaution Booklet Issued: Yes (comment) Precaution Comments: verbally reviewed BLT precautions and ADL strategies Required Braces or Orthoses: (no brace needed) Restrictions Weight Bearing Restrictions: No      Mobility  Bed Mobility Overal bed mobility: Needs Assistance Bed Mobility: Sit to Sidelying;Rolling Rolling: Supervision       Sit to sidelying: Supervision General bed mobility comments: no physical assist required  Transfers Overall transfer level: Needs assistance Equipment used: Rolling walker (2 wheeled) Transfers: Sit to/from Stand Sit to Stand: Supervision         General transfer comment: stood rom chair and from toilet without assist   Ambulation/Gait Ambulation/Gait assistance: Min guard;Min assist Gait Distance (Feet): 160 Feet Assistive device: 1 person hand held  assist Gait Pattern/deviations: Step-through pattern;Decreased stride length Gait velocity: decreased   General Gait Details: modest instability noted, HHA only provided in leiu of RW as patient refusing to use our RW  Science writer    Modified Rankin (Stroke Patients Only)       Balance Overall balance assessment: Needs assistance Sitting-balance support: No upper extremity supported;Feet supported Sitting balance-Leahy Scale: Fair     Standing balance support: During functional activity Standing balance-Leahy Scale: Fair Standing balance comment: able to perform functional tasks at sink without physical assist                             Pertinent Vitals/Pain Pain Assessment: Faces Faces Pain Scale: Hurts a little bit Pain Location: back Pain Descriptors / Indicators: Sore Pain Intervention(s): Monitored during session    Home Living Family/patient expects to be discharged to:: Skilled nursing facility Living Arrangements: Other (Comment)               Additional Comments: Pt is from [place] and has 24/7 aide. Has shower seat and grab bars as well as rollator    Prior Function Level of Independence: Independent with assistive device(s);Needs assistance   Gait / Transfers Assistance Needed: ambulates with rollator and supervision  ADL's / Homemaking Assistance Needed: "I pay a lot of money to have 24/7 help so I let them bathe and dress me."         Hand Dominance   Dominant Hand: Right    Extremity/Trunk Assessment   Upper Extremity Assessment Upper Extremity Assessment: Overall WFL for tasks assessed    Lower Extremity Assessment Lower Extremity Assessment: Generalized weakness  Cervical / Trunk Assessment Cervical / Trunk Assessment: Other exceptions(s/p back surgery)  Communication   Communication: HOH  Cognition Arousal/Alertness: Awake/alert Behavior During Therapy: WFL for tasks  assessed/performed Overall Cognitive Status: Within Functional Limits for tasks assessed                                        General Comments      Exercises     Assessment/Plan    PT Assessment Patient needs continued PT services  PT Problem List Decreased strength;Decreased activity tolerance;Decreased balance;Decreased mobility;Decreased safety awareness;Decreased knowledge of precautions;Pain       PT Treatment Interventions DME instruction;Gait training;Functional mobility training;Therapeutic activities;Therapeutic exercise;Balance training;Neuromuscular re-education;Patient/family education    PT Goals (Current goals can be found in the Care Plan section)  Acute Rehab PT Goals Patient Stated Goal: back to [facility] PT Goal Formulation: With patient Time For Goal Achievement: 04/22/18 Potential to Achieve Goals: Good    Frequency Min 5X/week   Barriers to discharge        Co-evaluation               AM-PAC PT "6 Clicks" Daily Activity  Outcome Measure Difficulty turning over in bed (including adjusting bedclothes, sheets and blankets)?: A Little Difficulty moving from lying on back to sitting on the side of the bed? : A Little Difficulty sitting down on and standing up from a chair with arms (e.g., wheelchair, bedside commode, etc,.)?: Unable Help needed moving to and from a bed to chair (including a wheelchair)?: A Little Help needed walking in hospital room?: A Little Help needed climbing 3-5 steps with a railing? : A Little 6 Click Score: 16    End of Session Equipment Utilized During Treatment: Gait belt Activity Tolerance: Patient tolerated treatment well Patient left: in bed;with call bell/phone within reach;with nursing/sitter in room;with bed alarm set   PT Visit Diagnosis: Unsteadiness on feet (R26.81);Difficulty in walking, not elsewhere classified (R26.2)    Time: 0165-5374 PT Time Calculation (min) (ACUTE ONLY): 20  min   Charges:   PT Evaluation $PT Eval Moderate Complexity: 1 Mod          Alben Deeds, PT DPT  Board Certified Neurologic Specialist [place] 04/08/2018, 4:03 PM

## 2018-04-08 NOTE — NC FL2 (Signed)
[place] LEVEL OF CARE SCREENING TOOL     IDENTIFICATION  Patient Name: Bethany Oconnor Birthdate: [birth year] Sex: female Admission Date (Current Location): 04/07/2018  [facility] and [place] Number:  Herbalist and Address:  [facility] [place]. [facility], [place] [address] [place], [place] [address]      Provider Number: [id number]  Attending Physician Name and Address:  Kristeen Miss, MD  Relative Name and Phone Number:       Current Level of Care: Hospital Recommended Level of Care: [place] Prior Approval Number:    Date Approved/Denied:   PASRR Number: [id number]  Discharge Plan: SNF    Current Diagnoses: Patient Active Problem List   Diagnosis Date Noted  . PAF (paroxysmal atrial fibrillation) ([place])   . Macular degeneration   . Acute blood loss anemia   . Chronic low back pain   . Herniated nucleus pulposus of lumbosacral region 04/07/2018  . Cerebral aneurysm   . Dementia with behavioral disturbance 12/20/2016  . Aggressive behavior   . CLL (chronic lymphocytic leukemia) ([place]) 09/14/2014  . 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 HYPERTENSION, BENIGN 05/31/2010  . ATRIAL FIBRILLATION 05/31/2010    Orientation RESPIRATION BLADDER Height & Weight     Self, Time, Situation, Place  Normal Continent Weight: 160 lb (72.6 kg) Height:  5\' 5"  (165.1 cm)  BEHAVIORAL SYMPTOMS/MOOD NEUROLOGICAL BOWEL NUTRITION STATUS      Continent Diet(see discharge summary)  AMBULATORY STATUS COMMUNICATION OF NEEDS Skin   Limited Assist Verbally Surgical wounds(incision on back with adhesive glue)                       Personal Care Assistance Level of Assistance  Bathing, Feeding, Dressing Bathing Assistance: Limited assistance Feeding assistance: Independent Dressing Assistance: Limited assistance     Functional Limitations Info  Speech, Hearing, Sight Sight Info: Adequate Hearing Info: Adequate Speech  Info: Adequate    SPECIAL CARE FACTORS FREQUENCY  PT (By licensed PT), OT (By licensed OT)     PT Frequency: 5x week OT Frequency: 5x week            Contractures Contractures Info: Not present    Additional Factors Info  Psychotropic     Psychotropic Info: buPROPion (WELLBUTRIN XL) 24 hr tablet 450 mg daily PO; donepezil (ARICEPT) tablet 5 mg daily at bedtime; memantine (NAMENDA) tablet 5 mg daily PO         Current Medications (04/08/2018):  This is the current hospital active medication list Current Facility-Administered Medications  Medication Dose Route Frequency Provider Last Rate Last Dose  . 0.9 %  sodium chloride infusion  250 mL Intravenous Continuous Kristeen Miss, MD      . acetaminophen (TYLENOL) tablet 650 mg  650 mg Oral Q4H PRN Kristeen Miss, MD       Or  . acetaminophen (TYLENOL) suppository 650 mg  650 mg Rectal Q4H PRN Kristeen Miss, MD      . acidophilus (RISAQUAD) capsule 1 capsule  1 capsule Oral Daily Kristeen Miss, MD   1 capsule at 04/08/18 0928  . alum & mag hydroxide-simeth (MAALOX/MYLANTA) 200-200-20 MG/5ML suspension 30 mL  30 mL Oral Q6H PRN Kristeen Miss, MD      . bisacodyl (DULCOLAX) suppository 10 mg  10 mg Rectal Daily PRN Kristeen Miss, MD      . brimonidine (ALPHAGAN) 0.2 % ophthalmic solution 1 drop  1 drop Left Eye BID  Skeet Simmer, RPH   1 drop at 04/08/18 0931   And  . timolol (TIMOPTIC) 0.5 % ophthalmic solution 1 drop  1 drop Left Eye BID Skeet Simmer, RPH   1 drop at 04/08/18 6045  . buPROPion (WELLBUTRIN XL) 24 hr tablet 450 mg  450 mg Oral Daily Kristeen Miss, MD   450 mg at 04/08/18 0929  . diazepam (VALIUM) tablet 2 mg  2 mg Oral BID Skeet Simmer, RPH   2 mg at 04/08/18 4098   And  . diazepam (VALIUM) tablet 4 mg  4 mg Oral QHS Skeet Simmer, RPH   4 mg at 04/07/18 2137  . diltiazem (CARDIZEM CD) 24 hr capsule 180 mg  180 mg Oral Daily Kristeen Miss, MD   180 mg at 04/08/18 0928  . diphenhydrAMINE (BENADRYL) capsule  25 mg  25 mg Oral Q6H PRN Kristeen Miss, MD      . docusate sodium (COLACE) capsule 100 mg  100 mg Oral BID Kristeen Miss, MD   100 mg at 04/08/18 0928  . donepezil (ARICEPT) tablet 5 mg  5 mg Oral Antoine Poche, MD   5 mg at 04/07/18 2136  . estrogens (conjugated) (PREMARIN) tablet 0.625 mg  0.625 mg Oral Daily Skeet Simmer, RPH   0.625 mg at 04/08/18 1191   And  . medroxyPROGESTERone (PROVERA) tablet 2.5 mg  2.5 mg Oral Daily Skeet Simmer, RPH   2.5 mg at 04/08/18 4782  . HYDROcodone-acetaminophen (NORCO/VICODIN) 5-325 MG per tablet 1-2 tablet  1-2 tablet Oral Q4H PRN Kristeen Miss, MD   2 tablet at 04/08/18 0929  . hydrocortisone (ANUSOL-HC) suppository 25 mg  25 mg Rectal BID PRN Kristeen Miss, MD      . lamoTRIgine (LAMICTAL) tablet 25 mg  25 mg Oral Daily Skeet Simmer, RPH   25 mg at 04/08/18 9562   And  . lamoTRIgine (LAMICTAL) tablet 50 mg  50 mg Oral q1800 Skeet Simmer, RPH      . latanoprost (XALATAN) 0.005 % ophthalmic solution 1 drop  1 drop Left Eye Antoine Poche, MD      . meclizine (ANTIVERT) tablet 25 mg  25 mg Oral BID PRN Kristeen Miss, MD      . memantine [facility]) tablet 5 mg  5 mg Oral Daily Kristeen Miss, MD   5 mg at 04/08/18 1308  . menthol-cetylpyridinium (CEPACOL) lozenge 3 mg  1 lozenge Oral PRN Kristeen Miss, MD       Or  . phenol (CHLORASEPTIC) mouth spray 1 spray  1 spray Mouth/Throat PRN Kristeen Miss, MD      . methocarbamol (ROBAXIN) tablet 500 mg  500 mg Oral Q6H PRN Kristeen Miss, MD   500 mg at 04/08/18 0746   Or  . methocarbamol (ROBAXIN) 500 mg in dextrose 5 % 50 mL IVPB  500 mg Intravenous Q6H PRN Kristeen Miss, MD      . morphine 2 MG/ML injection 2 mg  2 mg Intravenous Q2H PRN Kristeen Miss, MD      . ondansetron (ZOFRAN) tablet 4 mg  4 mg Oral Q6H PRN Kristeen Miss, MD       Or  . ondansetron (ZOFRAN) injection 4 mg  4 mg Intravenous Q6H PRN Kristeen Miss, MD   4 mg at 04/08/18 0033  . pantoprazole (PROTONIX) EC tablet 40 mg  40 mg  Oral Daily Kristeen Miss, MD   40 mg at 04/08/18 6578  .  polyethylene glycol (MIRALAX / GLYCOLAX) packet 17 g  17 g Oral Daily PRN Kristeen Miss, MD      . pravastatin (PRAVACHOL) tablet 40 mg  40 mg Oral q1800 Kristeen Miss, MD      . senna [facility]) tablet 8.6 mg  1 tablet Oral BID Kristeen Miss, MD   8.6 mg at 04/07/18 2137  . sodium chloride flush (NS) 0.9 % injection 3 mL  3 mL Intravenous Q12H Kristeen Miss, MD   3 mL at 04/08/18 0930  . sodium chloride flush (NS) 0.9 % injection 3 mL  3 mL Intravenous PRN Kristeen Miss, MD      . sodium phosphate (FLEET) 7-19 GM/118ML enema 1 enema  1 enema Rectal Once PRN Kristeen Miss, MD         Discharge Medications: Please see discharge summary for a list of discharge medications.  Relevant Imaging Results:  Relevant Lab Results:   Additional Information SS# [place], [place]

## 2018-04-09 DIAGNOSIS — M5116 Intervertebral disc disorders with radiculopathy, lumbar region: Secondary | ICD-10-CM | POA: Diagnosis not present

## 2018-04-09 MED ORDER — HYDROCODONE-ACETAMINOPHEN 5-325 MG PO TABS: 1.0000 | ORAL_TABLET | ORAL | 0 refills | Status: DC | PRN

## 2018-04-09 NOTE — Social Work (Signed)
Clinical Social Worker facilitated patient discharge including contacting patient family and facility to confirm patient discharge plans.  Clinical information faxed to facility and family agreeable with plan.  CSW arranged ambulance transport via [facility] to [facility]. RN to call [phone] with report for Larene Beach, RN, prior to discharge.  Clinical Social Worker will sign off for now as social work intervention is no longer needed. Please consult [place] again if new need arises.  Alexander Mt, [place] Social Worker [phone]

## 2018-04-09 NOTE — Progress Notes (Signed)
IV D/C'd, patient educated regarding meds and when to call 911, patient awaiting transportation.  Report called to [facility] at [facility].

## 2018-04-09 NOTE — Clinical Social Work Placement (Signed)
   CLINICAL SOCIAL WORK PLACEMENT  NOTE [facility]  Report to [phone] Date:  04/09/2018  Patient Details  Name: Bethany Oconnor MRN: [id number] Date of Birth: 05/31/1941  Clinical Social Work is seeking post-discharge placement for this patient at the [place] level of care (*CSW will initial, date and re-position this form in  chart as items are completed):  No   Patient/family provided with [place] Work Department's list of facilities offering this level of care within the geographic area requested by the patient (or if unable, by the patient's family).  Yes   Patient/family informed of their freedom to choose among providers that offer the needed level of care, that participate in Medicare, Medicaid or managed care program needed by the patient, have an available bed and are willing to accept the patient.  Yes   Patient/family informed of 's ownership interest in [facility] and [facility], as well as of the fact that they are under no obligation to receive care at these facilities.  PASRR submitted to EDS on       PASRR number received on 04/08/18     Existing PASRR number confirmed on       FL2 transmitted to all facilities in geographic area requested by pt/family on 04/08/18     FL2 transmitted to all facilities within larger geographic area on       Patient informed that his/her managed care company has contracts with or will negotiate with certain facilities, including the following:        Yes   Patient/family informed of bed offers received.  Patient chooses bed at [facility]     Physician recommends and patient chooses bed at      Patient to be transferred to [facility] on 04/09/18.  Patient to be transferred to facility by [facility]     Patient family notified on 04/09/18 of transfer.  Name of family member notified:  pt states she has informed family     PHYSICIAN       Additional Comment:     _______________________________________________ Alexander Mt, [place] 04/09/2018, 11:55 AM

## 2018-04-09 NOTE — Discharge Summary (Signed)
Physician Discharge Summary  Patient ID: Bethany Oconnor MRN: [id number] DOB/AGE: [birth year] 77 y.o.  Admit date: 04/07/2018 Discharge date: 04/09/2018  Admission Diagnoses: Herniated nucleus pulposus L4-5 and L5-S1 with right lumbar radiculopathy, spondylosis, macular degeneration  Discharge Diagnoses: Herniated nucleus pulposus L4-5 and L5-S1 with right lumbar radiculopathy, spondylosisActive Problems:   Herniated nucleus pulposus of lumbosacral region   PAF (paroxysmal atrial fibrillation) (HCC)   Macular degeneration   Acute blood loss anemia   Chronic low back pain   Discharged Condition: good  Hospital Course: Patient was admitted to undergo surgical decompression L4-5 and L5-S1 on the right.  She tolerated surgery well.  She was however rather disagreeable during the first 24 hours feeling ill and complaining that she wished to die.  This clouds seems to have lifted and she is now ready for discharge [facility].  Consults: Rehabilitation medicine  Significant Diagnostic Studies: None  Treatments: surgery: Laminotomy foraminotomies and discectomy L4-5 and L5-S1 on right  Discharge Exam: Blood pressure (!) 119/51, pulse 66, temperature 98.1 F (36.7 C), temperature source Oral, resp. rate 16, height 5\' 5"  (1.651 m), weight 72.6 kg, SpO2 94 %. Incision is clean and dry.  Station and gait are intact.  Disposition: Discharge disposition: 01-Home or Self Care       Discharge Instructions    Call MD for:  redness, tenderness, or signs of infection (pain, swelling, redness, odor or green/yellow discharge around incision site)   Complete by:  As directed    Call MD for:  severe uncontrolled pain   Complete by:  As directed    Call MD for:  temperature >100.4   Complete by:  As directed    Diet - low sodium heart healthy   Complete by:  As directed    Discharge instructions   Complete by:  As directed    Okay to shower. Do not apply salves or appointments  to incision. No heavy lifting with the upper extremities greater than 15 pounds.   Incentive spirometry RT   Complete by:  As directed    Increase activity slowly   Complete by:  As directed      Allergies as of 04/09/2018      Reactions   Ambien [zolpidem Tartrate] Other (See Comments)   UNSPECIFIED REACTION    Fioricet-codeine [butalbital-apap-caff-cod]    UNSPECIFIED REACTION    Macrobid [nitrofurantoin]    UNSPECIFIED REACTION    Percocet [oxycodone-acetaminophen] Nausea And Vomiting   Codeine Other (See Comments)   About 50 years ago, took Codeine for scratched cornea while pregnant, and it made her extremely sedated.   Keflex [cephalexin] Rash      Medication List    TAKE these medications   acetaminophen 500 MG tablet Commonly known as:  TYLENOL Take 500-1,000 mg by mouth every 8 (eight) hours as needed for moderate pain or headache.   ALIGN 4 MG Caps Take 4 mg by mouth daily.   buPROPion 300 MG 24 hr tablet Commonly known as:  WELLBUTRIN XL Take 450 mg by mouth daily.   Calcium Carbonate-Vitamin D 600-400 MG-UNIT tablet Take 1 tablet by mouth daily.   COMBIGAN 0.2-0.5 % ophthalmic solution Generic drug:  brimonidine-timolol Place 1 drop into the left eye 2 (two) times daily.   diazepam 2 MG tablet Commonly known as:  VALIUM Take 2-4 mg by mouth See admin instructions. Take 2 mg by mouth in the morning and take 2 mg by mouth mid-afternoon. Take 4 mg  by mouth at bedtime.   diltiazem 180 MG 24 hr capsule Commonly known as:  CARDIZEM CD Take 180 mg by mouth daily.   diphenhydrAMINE 25 mg capsule Commonly known as:  BENADRYL Take 25 mg by mouth every 6 (six) hours as needed for itching.   donepezil 5 MG tablet Commonly known as:  ARICEPT Take 5 mg by mouth at bedtime.   gabapentin 100 MG capsule Commonly known as:  NEURONTIN Take 1 capsule (100 mg total) by mouth 2 (two) times daily for 3 days.   HYDROcodone-acetaminophen 5-325 MG tablet Commonly known  as:  NORCO/VICODIN Take 1-2 tablets by mouth every 4 (four) hours as needed for severe pain ((score 7 to 10)).   hydrocortisone 25 MG suppository Commonly known as:  ANUSOL-HC Place 25 mg rectally 2 (two) times daily as needed for hemorrhoids or anal itching.   ibuprofen 400 MG tablet Commonly known as:  ADVIL,MOTRIN Take 1 tablet (400 mg total) by mouth every 8 (eight) hours as needed for moderate pain.   lamoTRIgine 25 MG tablet Commonly known as:  LAMICTAL Take 25-50 mg by mouth See admin instructions. Take 25 mg by mouth in the morning and take 50 mg by mouth at 1800-1900 for mood   latanoprost 0.005 % ophthalmic solution Commonly known as:  XALATAN Place 1 drop into the left eye at bedtime.   meclizine 25 MG tablet Commonly known as:  ANTIVERT Take 25 mg by mouth 2 (two) times daily as needed for dizziness.   Melatonin 5 MG Tabs Take 5 mg by mouth at bedtime.   memantine 5 MG tablet Commonly known as:  NAMENDA Take 5 mg by mouth daily.   multivitamins ther. w/minerals Tabs tablet Take 1 tablet by mouth daily.   PRESERVISION AREDS Caps Take 1 capsule daily by mouth.   omeprazole 20 MG capsule Commonly known as:  PRILOSEC Take 20 mg by mouth daily.   ondansetron 4 MG tablet Commonly known as:  ZOFRAN Take 4 mg by mouth every 4 (four) hours as needed for nausea or vomiting.   phenylephrine-shark liver oil-mineral oil-petrolatum 0.25-3-14-71.9 % rectal ointment Commonly known as:  PREPARATION H Place 1 application rectally 4 (four) times daily as needed for hemorrhoids.   pravastatin 40 MG tablet Commonly known as:  PRAVACHOL Take 40 mg by mouth daily.   PREMPRO 0.625-2.5 MG tablet Generic drug:  estrogen (conjugated)-medroxyprogesterone Take 1 tablet by mouth daily.      Contact information for after-discharge care    Destination    [facility] .   Service:  Skilled Nursing Contact information: [place] [phone]              Signed: Earleen [place] 04/09/2018, 9:26 AM

## 2018-04-09 NOTE — Progress Notes (Signed)
Patient ID: Bethany Oconnor, female   DOB: 04-06-1941, 77 y.o.   MRN: [id number] Vital signs are stable Feeling somewhat better Wishes to be discharged [facility] to [facility] We will make arrangements today

## 2018-04-09 NOTE — Clinical Social Work Note (Signed)
Clinical Social Work Assessment  Patient Details  Name: Bethany Oconnor MRN: [id number] Date of Birth: 1941-04-25  Date of referral:  04/09/18               Reason for consult:  Facility Placement, Discharge Planning                Permission sought to share information with:  Facility Sport and exercise psychologist, Family Supports Permission granted to share information::  Yes, Verbal Permission Granted  Name::        Agency::  [facility]  Relationship::     Contact Information:     Housing/Transportation Living arrangements for the past 2 months:  [place] of Information:  Patient Patient Interpreter Needed:    Criminal Activity/Legal Involvement Pertinent to Current Situation/Hospitalization:  No - Comment as needed Significant Relationships:  Adult Children, Community Support Lives with:  Self, Facility Resident Do you feel safe going back to the place where you live?  Yes Need for family participation in patient care:  Yes (Comment)  Care giving concerns:  Pt had surgery this admission, has caregivers 24/7. Has support with all IADLs and ADLs, no concerns noted.    Social Worker assessment / plan: CSW met with pt at bedside. Pt from [place] where she has lived for "the past couple of years." States she has good care and feels supported. Pt requested SNF at [place]. Pt excited to hear that they have a bed and she will be returning today. All questions answered, pt requesting CSW provide her with a "pain pill." CSW referred to pt bedside RN, will facilitate d/c.   Employment status:  Retired Nurse, adult PT Recommendations:  [place], [place] / Referral to community resources:  [place]  Patient/Family's Response to care: Pt amenable to [facility] visit, states she is happy to be going back to [facility].   Patient/Family's Understanding of and Emotional Response to  Diagnosis, Current Treatment, and Prognosis:  Pt states understanding of diagnosis, current treatment and prognosis. Pt has good supports at [facility] (yoga, massage therapy, 24/7 caregivers). Pt expresses no concerns except continuing to have her pain managed. Pt ready to return home.   Emotional Assessment Appearance:  Appears stated age Attitude/Demeanor/Rapport:  Self-Confident, Engaged Affect (typically observed):  Blunt, Pleasant, Accepting Orientation:  Oriented to Self, Oriented to Place, Oriented to  Time, Oriented to Situation Alcohol / Substance use:  Not Applicable Psych involvement (Current and /or in the community):  No (Comment)  Discharge Needs  Concerns to be addressed:  Care Coordination Readmission within the last 30 days:  No Current discharge risk:  Cognitively Impaired, Dependent with Mobility Barriers to Discharge:  Barriers Resolved   [place] 04/09/2018, 11:54 AM

## 2018-04-14 ENCOUNTER — Encounter: Payer: Self-pay | Admitting: Internal Medicine

## 2018-04-14 ENCOUNTER — Non-Acute Institutional Stay (SKILLED_NURSING_FACILITY): Payer: Medicare Other | Admitting: Internal Medicine

## 2018-04-14 DIAGNOSIS — K649 Unspecified hemorrhoids: Secondary | ICD-10-CM | POA: Insufficient documentation

## 2018-04-14 DIAGNOSIS — M48062 Spinal stenosis, lumbar region with neurogenic claudication: Secondary | ICD-10-CM

## 2018-04-14 DIAGNOSIS — C9110 Chronic lymphocytic leukemia of B-cell type not having achieved remission: Secondary | ICD-10-CM

## 2018-04-14 DIAGNOSIS — G301 Alzheimer's disease with late onset: Secondary | ICD-10-CM

## 2018-04-14 DIAGNOSIS — F0281 Dementia in other diseases classified elsewhere with behavioral disturbance: Secondary | ICD-10-CM

## 2018-04-14 DIAGNOSIS — Z9889 Other specified postprocedural states: Secondary | ICD-10-CM | POA: Insufficient documentation

## 2018-04-14 DIAGNOSIS — D62 Acute posthemorrhagic anemia: Secondary | ICD-10-CM

## 2018-04-14 DIAGNOSIS — C9190 Lymphoid leukemia, unspecified not having achieved remission: Secondary | ICD-10-CM

## 2018-04-14 DIAGNOSIS — H9113 Presbycusis, bilateral: Secondary | ICD-10-CM | POA: Insufficient documentation

## 2018-04-14 DIAGNOSIS — I4891 Unspecified atrial fibrillation: Secondary | ICD-10-CM

## 2018-04-14 DIAGNOSIS — M5127 Other intervertebral disc displacement, lumbosacral region: Secondary | ICD-10-CM

## 2018-04-14 DIAGNOSIS — H543 Unqualified visual loss, both eyes: Secondary | ICD-10-CM

## 2018-04-14 DIAGNOSIS — E1122 Type 2 diabetes mellitus with diabetic chronic kidney disease: Secondary | ICD-10-CM | POA: Insufficient documentation

## 2018-04-14 DIAGNOSIS — I129 Hypertensive chronic kidney disease with stage 1 through stage 4 chronic kidney disease, or unspecified chronic kidney disease: Secondary | ICD-10-CM

## 2018-04-14 DIAGNOSIS — F418 Other specified anxiety disorders: Secondary | ICD-10-CM

## 2018-04-14 DIAGNOSIS — E7800 Pure hypercholesterolemia, unspecified: Secondary | ICD-10-CM | POA: Insufficient documentation

## 2018-04-14 DIAGNOSIS — N183 Chronic kidney disease, stage 3 unspecified: Secondary | ICD-10-CM | POA: Insufficient documentation

## 2018-04-14 NOTE — Progress Notes (Signed)
Patient ID: Bethany Oconnor, female   DOB: 1940-12-28, 77 y.o.   MRN: [id number]  Provider:  Rexene Edison. Mariea Clonts, D.O., C.M.D. Location:  [facility] Room Number: [place] of Service:  SNF (31)  PCP: Crist Infante, MD Patient Care Team: Crist Infante, MD as PCP - General (Internal Medicine) Sherren Mocha, MD as PCP - Cardiology (Cardiology) Crist Infante, MD (Internal Medicine) Newton Pigg, MD as Consulting Physician (Obstetrics and Gynecology) Truitt Merle, MD as Consulting Physician (Hematology) Leta Baptist, MD as Consulting Physician (Otolaryngology) Sherlynn Stalls, MD as Consulting Physician (Ophthalmology) Paulla Dolly Tamala Fothergill, DPM as Consulting Physician (Podiatry) Harriett Sine, MD as Consulting Physician (Dermatology) Erroll Luna, MD as Consulting Physician (General Surgery)  Extended Emergency Contact Information Primary Emergency Contact: Holt,Cybil  [place] Phone: [phone] Relation: Other  Code Status:  DNR Goals of Care: Advanced Directive information Advanced Directives 04/14/2018  Does Patient Have a Medical Advance Directive? Yes  Type of Advance Directive Out of facility DNR (pink MOST or yellow form);Healthcare Power of Attorney  Does patient want to make changes to medical advance directive? No - Patient declined  Copy of [place] in Chart? Yes  Would patient like information on creating a medical advance directive? -  Pre-existing out of facility DNR order (yellow form or pink MOST form) Yellow form placed in chart (order not valid for inpatient use)   Chief Complaint  Patient presents with  . New Admit To SNF    Rehab admission    HPI: Patient is a 77 y.o. female with a h/o lumbar spinal stenosis with neurogenic claudication, cerebral aneurysm, paroxysmal afib, CKD3, COPD, peripheral neuropathy, CLL, dementia, macular degeneration with low vision, hearing loss, goiter,  osteopenia, seen today for admission to rehab s/p hospitalization 8/6-04/09/18 with back pain, right buttock and leg pain which has been worse since March of this year.  She had a steroid injection by Dr. Ernestina Patches which did not giver her substantial relief.  MRI in April showed multilievel DDD with moderate degenerative scoliosis.  Per Dr. Clarice Pole note, she had significant foraminal stenosis on the right side at L4-5 with moderate protrusion of the disc in the extraforaminal zone and a herniated nucleus pulposus at L5-S1 that elevated and compressed the S1 n root in the foramen.  Her pain was getting worse despite conservative efforts and she opted to undergo surgery.  On 04/07/18, Dr. Ellene Route performed a microdiscectomy L4-5 and L5-S1 on the right and decompression of the L5 and S1 nerve roots under general anesthesia.  Postop, she was reluctant to move per notes and rehab was recommended.  PM&R was consulted for possible inpatient rehab, she refused and requested to return to [facility].  Exam then was grossly 4-/5 of all extremities but there was question if she was refusing to participate in Dr. Serita Grit exam.  She expressed desires to die and feeling ill during her hospitalization, but her attitude improved by the time of discharge.  Pt sees [facility] and last saw them end of June.  There's a med list from there.  She's also seen several specialists in epic since and gets her meds from [facility] who should have the most updated medication list which was supplied preop per the notes in epic/[facility].    When seen, she reported that all of her back and leg pain is still there--says it's worse b/c it didn't bother her before (can't be true or she would not have had  surgery).  She remains quite depressed and tearful about loss of her husband.    Past Medical History:  Diagnosis Date  . Anxiety   . Arthritis   . Cerebral aneurysm    s/p endovascular col[place] 2008  . CLL (chronic lymphocytic leukemia) ([facility])     . Depression   . Hearing loss   . Macular degeneration   . Neuropathy   . Paroxysmal atrial fibrillation (H[facility])   . Pneumonia    08-04-17  . PONV (postoperative nausea and vomiting)   . Thyroid disease    Past Surgical History:  Procedure Laterality Date  . ANEURYSM COILING    . CATARACT EXTRACTION     bilateral  . CERVICAL CONE BIOPSY    . COLONOSCOPY    . EVALUATION UNDER ANESTHESIA WITH HEMORRHOIDECTOMY N/A 09/03/2017   Procedure: EXAM UNDER ANESTHESIA, EXCISION OF ANAL LESION, POSSIBLE HEMORRHOIDECTOMY;  Surgeon: Ileana Roup, MD;  Location: [facility];  Service: General;  Laterality: N/A;  . LUMBAR LAMINECTOMY/DECOMPRESSION MICRODISCECTOMY Right 04/07/2018   Procedure: Right Lumbar Four-Five Lumbar Five-Sacral One Laminectomy/Microdiscectomy;  Surgeon: Kristeen Miss, MD;  Location: [facility];  Service: Neurosurgery;  Laterality: Right;  Right Lumbar Four-Five Lumbar Five-Sacral One Laminectomy/Microdiscectomy  . THYROIDECTOMY    . TONSILLECTOMY      reports that she quit smoking about 29 years ago. She has never used smokeless tobacco. She reports that she does not drink alcohol or use drugs. Social History   Socioeconomic History  . Marital status: Widowed    Spouse name: Not on file  . Number of children: 2  . Years of education: Not on file  . Highest education level: Not on file  Occupational History    Employer: RETIRED  Social Needs  . Financial resource strain: Not on file  . Food insecurity:    Worry: Not on file    Inability: Not on file  . Transportation needs:    Medical: Not on file    Non-medical: Not on file  Tobacco Use  . Smoking status: Former Smoker    Last attempt to quit: 09/02/1988    Years since quitting: 29.6  . Smokeless tobacco: Never Used  Substance and Sexual Activity  . Alcohol use: No    Alcohol/week: 1.0 - 2.0 standard drinks    Types: 1 - 2 Glasses of wine per week    Comment: daily for 50 years  No longer drinks 09-01-17  . Drug use:  No  . Sexual activity: Not Currently  Lifestyle  . Physical activity:    Days per week: Not on file    Minutes per session: Not on file  . Stress: Not on file  Relationships  . Social connections:    Talks on phone: Not on file    Gets together: Not on file    Attends religious service: Not on file    Active member of club or organization: Not on file    Attends meetings of clubs or organizations: Not on file    Relationship status: Not on file  . Intimate partner violence:    Fear of current or ex partner: Not on file    Emotionally abused: Not on file    Physically abused: Not on file    Forced sexual activity: Not on file  Other Topics Concern  . Not on file  Social History Narrative  . Not on file    Functional Status Survey:    Family History  Problem Relation Age of  Onset  . Cancer Father 43       died  . Cerebral aneurysm Mother 66       died  . Heart attack Brother     Health Maintenance  Topic Date Due  . DEXA SCAN  02/13/2004  . PNA vac Low Risk Adult (1 of 2 - PCV13) 02/13/2004  . INFLUENZA VACCINE  04/02/2018  . TETANUS/TDAP  12/20/2026    Allergies  Allergen Reactions  . Ambien [Zolpidem Tartrate] Other (See Comments)    UNSPECIFIED REACTION   . Fioricet-Codeine [Butalbital-Apap-Caff-Cod]     UNSPECIFIED REACTION   . Macrobid [Nitrofurantoin]     UNSPECIFIED REACTION   . Percocet [Oxycodone-Acetaminophen] Nausea And Vomiting  . Codeine Other (See Comments)    About 50 years ago, took Codeine for scratched cornea while pregnant, and it made her extremely sedated.  Marland Kitchen Keflex [Cephalexin] Rash    Outpatient Encounter Medications as of 04/14/2018  Medication Sig  . acetaminophen (TYLENOL) 500 MG tablet Take 500-1,000 mg by mouth every 8 (eight) hours as needed for moderate pain or headache.  Marland Kitchen buPROPion (WELLBUTRIN XL) 300 MG 24 hr tablet Take 450 mg by mouth daily.   . Calcium Carbonate-Vitamin D 600-400 MG-UNIT per tablet Take 1 tablet by mouth  daily.  . COMBIGAN 0.2-0.5 % ophthalmic solution Place 1 drop into the left eye 2 (two) times daily.   . diazepam (VALIUM) 2 MG tablet Take 2 mg by mouth in the morning and take 2 mg by mouth mid-afternoon. Take 4 mg by mouth at bedtime.   Marland Kitchen diltiazem (CARDIZEM CD) 180 MG 24 hr capsule Take 180 mg by mouth daily.  Marland Kitchen donepezil (ARICEPT) 5 MG tablet Take 5 mg by mouth at bedtime.  Marland Kitchen HYDROcodone-acetaminophen (NORCO/VICODIN) 5-325 MG tablet Take 1 tablet by mouth every 6 (six) hours as needed for moderate pain.  . hydrocortisone (ANUSOL-HC) 25 MG suppository Place 25 mg rectally 2 (two) times daily as needed for hemorrhoids or anal itching.  Marland Kitchen ibuprofen (ADVIL,MOTRIN) 400 MG tablet Take 1 tablet (400 mg total) by mouth every 8 (eight) hours as needed for moderate pain.  Marland Kitchen lamoTRIgine (LAMICTAL) 25 MG tablet Take 25 mg by mouth every morning. Take 50 mg every evening  . latanoprost (XALATAN) 0.005 % ophthalmic solution Place 1 drop into the left eye at bedtime.   . meclizine (ANTIVERT) 25 MG tablet Take 25 mg by mouth 2 (two) times daily as needed for dizziness.  . Melatonin 5 MG TABS Take 5 mg by mouth at bedtime.  . memantine (NAMENDA) 5 MG tablet Take 5 mg by mouth daily.  . Multiple Vitamins-Minerals (MULTIVITAMINS THER. W/MINERALS) TABS tablet Take 1 tablet by mouth daily.  . Multiple Vitamins-Minerals (PRESERVISION AREDS) CAPS Take 1 capsule by mouth daily.  Marland Kitchen omeprazole (PRILOSEC) 20 MG capsule Take 20 mg by mouth daily.  . ondansetron (ZOFRAN) 4 MG tablet Take 4 mg by mouth every 4 (four) hours as needed for nausea or vomiting.  . phenylephrine-shark liver oil-mineral oil-petrolatum (PREPARATION H) 0.25-3-14-71.9 % rectal ointment Place 1 application rectally 4 (four) times daily as needed for hemorrhoids.  . pravastatin (PRAVACHOL) 40 MG tablet Take 40 mg by mouth daily.   Marland Kitchen PREMPRO 0.625-2.5 MG tablet Take 1 tablet by mouth daily.   . Probiotic Product (ALIGN) 4 MG CAPS Take 4 mg by mouth  daily.   . [DISCONTINUED] diphenhydrAMINE (BENADRYL) 25 mg capsule Take 25 mg by mouth every 6 (six) hours as needed for itching.  . [  DISCONTINUED] gabapentin (NEURONTIN) 100 MG capsule Take 1 capsule (100 mg total) by mouth 2 (two) times daily for 3 days. (Patient not taking: Reported on 04/03/2018)  . [DISCONTINUED] HYDROcodone-acetaminophen (NORCO/VICODIN) 5-325 MG tablet Take 1-2 tablets by mouth every 4 (four) hours as needed for severe pain ((score 7 to 10)).  . [DISCONTINUED] Multiple Vitamins-Minerals (PRESERVISION AREDS) CAPS Take 1 capsule daily by mouth.   No facility-administered encounter medications on file as of 04/14/2018.     Review of Systems  Constitutional: Positive for fatigue. Negative for activity change, appetite change and unexpected weight change.  HENT: Negative for congestion.   Eyes:       Legally blind  Respiratory: Negative for chest tightness and shortness of breath.   Cardiovascular: Negative for chest pain and palpitations.  Gastrointestinal: Negative for abdominal pain.       Hemorrhoids  Genitourinary: Negative for dysuria.  Musculoskeletal: Positive for back pain.  Neurological: Positive for weakness and numbness.  Psychiatric/Behavioral: Positive for behavioral problems and confusion. The patient is nervous/anxious.        Depression and tearfulness about loss of her husband    Vitals:   04/14/18 0954  BP: 134/75  Pulse: 66  Resp: 11  Temp: 97.8 F (36.6 C)  TempSrc: Oral  SpO2: 94%  Weight: 159 lb (72.1 kg)  Height: 5\' 5"  (1.651 m)   Body mass index is 26.46 kg/m. Physical Exam  Constitutional: She appears well-developed and well-nourished. No distress.  HENT:  Head: Normocephalic and atraumatic.  Right Ear: External ear normal.  Left Ear: External ear normal.  Nose: Nose normal.  Mouth/Throat: Oropharynx is clear and moist.  HOH  Eyes: Pupils are equal, round, and reactive to light. EOM are normal.  Poor vision  Neck: Normal range  of motion. Neck supple. No JVD present.  Cardiovascular: Intact distal pulses.  irreg irreg  Pulmonary/Chest: Effort normal and breath sounds normal. No respiratory distress.  Abdominal: Soft. Bowel sounds are normal. She exhibits no distension. There is no tenderness.  Musculoskeletal:  Tenderness of lower lumbar/sacral region and buttocks  Neurological: She is alert. No cranial nerve deficit.  Oriented to person and [facility]  Skin: Skin is warm and dry. Capillary refill takes less than 2 seconds. There is pallor.  Incision site well healed, no surrounding erythema, warmth, no drainage  Psychiatric:  Tearful, frustrated    Labs reviewed: Basic Metabolic Panel: Recent Labs    09/01/17 0908 10/24/17 1241 04/07/18 1053  NA 140 140 144  K 4.2 4.0 4.3  CL 110 106 108  CO2 23 25 26   GLUCOSE 138* 130 89  BUN 12 9 11   CREATININE 0.88 0.83 0.95  CALCIUM 9.0 9.3 9.0   Liver Function Tests: Recent Labs    04/25/17 1113 09/01/17 0908 10/24/17 1241  AST 16 28 14   ALT 14 16 15   ALKPHOS 60 58 67  BILITOT 0.41 0.6 0.3  PROT 6.7 6.5 6.4  ALBUMIN 3.3* 3.6 3.4*   No results for input(s): LIPASE, AMYLASE in the last 8760 hours. No results for input(s): AMMONIA in the last 8760 hours. CBC: Recent Labs    07/28/17 1119 09/01/17 0908 10/24/17 1241 04/07/18 1053  WBC 19.8* 20.5* 17.5* 35.0*  NEUTROABS 3.9 3.9 4.5  --   HGB 13.6 13.7 12.9 13.4  HCT 40.8 41.1 39.3 41.3  MCV 96.7 96.9 97.2 102.2*  PLT 171 182 193 206   Cardiac Enzymes: No results for input(s): CKTOTAL, CKMB, CKMBINDEX, TROPONINI in the last  8760 hours. BNP: Invalid input(s): POCBNP Lab Results  Component Value Date   HGBA1C 5.7 (H) 09/01/2017   Lab Results  Component Value Date   TSH 2.63 12/21/2016   No results found for: VITAMINB12 No results found for: FOLATE No results found for: IRON, TIBC, FERRITIN  Imaging and Procedures obtained prior to SNF admission: Dg Lumbar Spine 2-3 Views  Result  Date: 04/07/2018 CLINICAL DATA:  77 year old female for L4-5 laminectomy. Initial encounter. EXAM: LUMBAR SPINE - 2-3 VIEW COMPARISON:  12/21/2017 MR. FINDINGS: Two intraoperative lateral views of the lumbar spine submitted for review. Level assignment as per prior MR. First film (04/07/2018 3:50 p.m.) reveals metallic probe along the inferior aspect of the L4 spinous process. Second film (04/07/2018 3:56 p.m.) reveals metallic probe directed towards the L4-5 disc space. Surgical sponge in place. IMPRESSION: Localization L4-5. Electronically Signed   By: Genia Del M.D.   On: 04/07/2018 16:31    Assessment/Plan 1. Spinal stenosis of lumbar region with neurogenic claudication -per MRI -pt still with symptoms after her microdiscectomy for herniated discs -will add cymbalta 20mg  daily for this and her depression -also not on gabapentin or lyrica for neuropathic pain (does have several psych meds with potential interactions though)  2. Herniated nucleus pulposus of lumbosacral region -residual symptoms persist -working with PT, OT postop  3. S/P lumbar discectomy -cont PT, OT, walker use, limit hydrocodone as does not tolerate opioids well with her other meds, depression and dementia, VERY HIGH RISK for delirium -cont prn ibuprofen with food and tylenol preferably -this will be a long recovery for her and I hope she can do well with her comorbid psychiatric and cognitive difficulties and sensory impairments  4. Late onset Alzheimer's disease with behavioral disturbance -continues on low dose namenda and aricept -has lamictal which I believe is for her behaviors and depression  -had 24h caregivers in [place] before this and really does not want to move to higher level of care   5. Low vision, both eyes -requires special prompts and low vision assistance  6. Presbycusis of both ears -also very HOH and requires extra attention  7. Acute blood loss anemia -f/u cbc 8/19  8. Depression with  anxiety -add cymbalta low dose to her regimen, if handling well, will titrate  -also on wellbutrin which should be stimulatory  -uses prn valium also--would like to be able to taper off but we'll see  9. Atrial fibrillation, unspecified type ([facility]) -rate controlled with diltiazem, not on anticoagulation  10. CLL (chronic lymphocytic leukemia) ([facility]) -follows with Dr. Salvatore Decent in feb and notes indicate early disease and decision was to continue monitoring at that point -pt would be a poor candidate for any aggressive treatments in my view  Family/ staff Communication: discussed with rehab nurse  Labs/tests ordered:  F/u cbc 8/19, f/u Dr. Ellene Route 2 wks  Ashan Cueva L. Akemi Overholser, D.O. [facility] [address], [place] [address] Cell Phone (Mon-Fri 8am-5pm):  [phone] On Call:  [phone] & follow prompts after 5pm & weekends Office Phone:  [phone] Office Fax:  [phone]

## 2018-04-20 LAB — CBC AND DIFFERENTIAL
HEMATOCRIT: 41 (ref 36–46)
HEMOGLOBIN: 13.7 (ref 12.0–16.0)
Platelets: 233 (ref 150–399)
WBC: 26.3

## 2018-04-21 ENCOUNTER — Inpatient Hospital Stay: Payer: Medicare Other

## 2018-04-21 ENCOUNTER — Inpatient Hospital Stay: Payer: Medicare Other | Attending: Nurse Practitioner | Admitting: Nurse Practitioner

## 2018-04-21 DIAGNOSIS — F419 Anxiety disorder, unspecified: Secondary | ICD-10-CM | POA: Insufficient documentation

## 2018-04-21 DIAGNOSIS — G629 Polyneuropathy, unspecified: Secondary | ICD-10-CM | POA: Insufficient documentation

## 2018-04-21 DIAGNOSIS — Z79899 Other long term (current) drug therapy: Secondary | ICD-10-CM | POA: Insufficient documentation

## 2018-04-21 DIAGNOSIS — C9111 Chronic lymphocytic leukemia of B-cell type in remission: Secondary | ICD-10-CM | POA: Insufficient documentation

## 2018-04-21 DIAGNOSIS — E079 Disorder of thyroid, unspecified: Secondary | ICD-10-CM | POA: Insufficient documentation

## 2018-04-21 DIAGNOSIS — I10 Essential (primary) hypertension: Secondary | ICD-10-CM | POA: Insufficient documentation

## 2018-04-21 NOTE — Progress Notes (Deleted)
[facility]  [phone] Fax:([phone]) [phone]  Clinic Follow up Note   Patient Care Team: Crist Infante, MD as PCP - General (Internal Medicine) Sherren Mocha, MD as PCP - Cardiology (Cardiology) Crist Infante, MD (Internal Medicine) Newton Pigg, MD as Consulting Physician (Obstetrics and Gynecology) Truitt Merle, MD as Consulting Physician (Hematology) Leta Baptist, MD as Consulting Physician (Otolaryngology) Sherlynn Stalls, MD as Consulting Physician (Ophthalmology) Regal, Tamala Fothergill, DPM as Consulting Physician (Podiatry) Harriett Sine, MD as Consulting Physician (Dermatology) Erroll Luna, MD as Consulting Physician (General Surgery) 04/21/2018  DIAGNOSIS: CLL, stage 0  CURRENT THERAPY: observation   INTERVAL HISTORY: Please see below for problem oriented charting.  REVIEW OF SYSTEMS:   Constitutional: Denies fevers, chills or abnormal weight loss Eyes: Denies blurriness of vision Ears, nose, mouth, throat, and face: Denies mucositis or sore throat Respiratory: Denies cough, dyspnea or wheezes Cardiovascular: Denies palpitation, chest discomfort or lower extremity swelling Gastrointestinal:  Denies nausea, heartburn or change in bowel habits Skin: Denies abnormal skin rashes Lymphatics: Denies new lymphadenopathy or easy bruising Neurological:Denies numbness, tingling or new weaknesses Behavioral/Psych: Mood is stable, no new changes  All other systems were reviewed with the patient and are negative.  MEDICAL HISTORY:  Past Medical History:  Diagnosis Date  . Anxiety   . Arthritis   . Cerebral aneurysm    s/p endovascular colling 2008  . CLL (chronic lymphocytic leukemia) ([place])   . Depression   . Hearing loss   . Macular degeneration   . Neuropathy   . Paroxysmal atrial fibrillation (H[facility])   . Pneumonia    08-04-17  . PONV (postoperative nausea and vomiting)   . Thyroid disease     SURGICAL HISTORY: Past Surgical History:    Procedure Laterality Date  . ANEURYSM COILING    . CATARACT EXTRACTION     bilateral  . CERVICAL CONE BIOPSY    . COLONOSCOPY    . EVALUATION UNDER ANESTHESIA WITH HEMORRHOIDECTOMY N/A 09/03/2017   Procedure: EXAM UNDER ANESTHESIA, EXCISION OF ANAL LESION, POSSIBLE HEMORRHOIDECTOMY;  Surgeon: Ileana Roup, MD;  Location: [facility];  Service: General;  Laterality: N/A;  . LUMBAR LAMINECTOMY/DECOMPRESSION MICRODISCECTOMY Right 04/07/2018   Procedure: Right Lumbar Four-Five Lumbar Five-Sacral One Laminectomy/Microdiscectomy;  Surgeon: Kristeen Miss, MD;  Location: [facility];  Service: Neurosurgery;  Laterality: Right;  Right Lumbar Four-Five Lumbar Five-Sacral One Laminectomy/Microdiscectomy  . THYROIDECTOMY    . TONSILLECTOMY      I have reviewed the social history and family history with the patient and they are unchanged from previous note.  ALLERGIES:  is allergic to [employer] tartrate]; fioricet-codeine [butalbital-apap-caff-cod]; macrobid [nitrofurantoin]; percocet [oxycodone-acetaminophen]; codeine; and keflex [cephalexin].  MEDICATIONS:  Current Outpatient Medications  Medication Sig Dispense Refill  . acetaminophen (TYLENOL) 500 MG tablet Take 500-1,000 mg by mouth every 8 (eight) hours as needed for moderate pain or headache.    Marland Kitchen buPROPion (WELLBUTRIN XL) 300 MG 24 hr tablet Take 450 mg by mouth daily.     . Calcium Carbonate-Vitamin D 600-400 MG-UNIT per tablet Take 1 tablet by mouth daily.    . COMBIGAN 0.2-0.5 % ophthalmic solution Place 1 drop into the left eye 2 (two) times daily.     . diazepam (VALIUM) 2 MG tablet Take 2 mg by mouth in the morning and take 2 mg by mouth mid-afternoon. Take 4 mg by mouth at bedtime.     Marland Kitchen diltiazem (CARDIZEM CD) 180 MG 24 hr capsule Take 180 mg by mouth daily.    Marland Kitchen  donepezil (ARICEPT) 5 MG tablet Take 5 mg by mouth at bedtime.    Marland Kitchen HYDROcodone-acetaminophen (NORCO/VICODIN) 5-325 MG tablet Take 1 tablet by mouth every 6 (six) hours as  needed for moderate pain.    . hydrocortisone (ANUSOL-HC) 25 MG suppository Place 25 mg rectally 2 (two) times daily as needed for hemorrhoids or anal itching.    Marland Kitchen ibuprofen (ADVIL,MOTRIN) 400 MG tablet Take 1 tablet (400 mg total) by mouth every 8 (eight) hours as needed for moderate pain. 60 tablet 0  . lamoTRIgine (LAMICTAL) 25 MG tablet Take 25 mg by mouth every morning. Take 50 mg every evening    . latanoprost (XALATAN) 0.005 % ophthalmic solution Place 1 drop into the left eye at bedtime.     . meclizine (ANTIVERT) 25 MG tablet Take 25 mg by mouth 2 (two) times daily as needed for dizziness.    . Melatonin 5 MG TABS Take 5 mg by mouth at bedtime.    . memantine (NAMENDA) 5 MG tablet Take 5 mg by mouth daily.    . Multiple Vitamins-Minerals (MULTIVITAMINS THER. W/MINERALS) TABS tablet Take 1 tablet by mouth daily.    . Multiple Vitamins-Minerals (PRESERVISION AREDS) CAPS Take 1 capsule by mouth daily.    Marland Kitchen omeprazole (PRILOSEC) 20 MG capsule Take 20 mg by mouth daily.    . ondansetron (ZOFRAN) 4 MG tablet Take 4 mg by mouth every 4 (four) hours as needed for nausea or vomiting.    . phenylephrine-shark liver oil-mineral oil-petrolatum (PREPARATION H) 0.25-3-14-71.9 % rectal ointment Place 1 application rectally 4 (four) times daily as needed for hemorrhoids.    . pravastatin (PRAVACHOL) 40 MG tablet Take 40 mg by mouth daily.     Marland Kitchen PREMPRO 0.625-2.5 MG tablet Take 1 tablet by mouth daily.   3  . Probiotic Product (ALIGN) 4 MG CAPS Take 4 mg by mouth daily.      No current facility-administered medications for this visit.     PHYSICAL EXAMINATION: ECOG PERFORMANCE STATUS: {CHL ONC ECOG PS:[id number]}  There were no vitals filed for this visit. There were no vitals filed for this visit.  GENERAL:alert, no distress and comfortable SKIN: skin color, texture, turgor are normal, no rashes or significant lesions EYES: normal, Conjunctiva are pink and non-injected, sclera  clear OROPHARYNX:no exudate, no erythema and lips, buccal mucosa, and tongue normal  NECK: supple, thyroid normal size, non-tender, without nodularity LYMPH:  no palpable lymphadenopathy in the cervical, axillary or inguinal LUNGS: clear to auscultation and percussion with normal breathing effort HEART: regular rate & rhythm and no murmurs and no lower extremity edema ABDOMEN:abdomen soft, non-tender and normal bowel sounds Musculoskeletal:no cyanosis of digits and no clubbing  NEURO: alert & oriented x 3 with fluent speech, no focal motor/sensory deficits  LABORATORY DATA:  I have reviewed the data as listed CBC Latest Ref Rng & Units 04/07/2018 11/11/2017 10/24/2017  WBC 4.0 - 10.5 K/uL 35.0(H) 19.5 17.5(H)  Hemoglobin 12.0 - 15.0 g/dL 13.4 13.8 12.9  Hematocrit 36.0 - 46.0 % 41.3 42 39.3  Platelets 150 - 400 K/uL 206 213 193     CMP Latest Ref Rng & Units 04/07/2018 02/24/2018 11/11/2017  Glucose 70 - 99 mg/dL 89 - -  BUN 8 - 23 mg/dL 11 9 12   Creatinine 0.44 - 1.00 mg/dL 0.95 0.9 0.8  Sodium 135 - 145 mmol/L 144 140 142  Potassium 3.5 - 5.1 mmol/L 4.3 4.0 3.8  Chloride 98 - 111 mmol/L 108 - -  CO2 22 - 32 mmol/L 26 - -  Calcium 8.9 - 10.3 mg/dL 9.0 - -  Total Protein 6.4 - 8.3 g/dL - - -  Total Bilirubin 0.2 - 1.2 mg/dL - - -  Alkaline Phos 25 - 125 - - 65  AST 13 - 35 - - 16  ALT 7 - 35 - - 13   Flow cytometry 08/16/2014 Peripheral Blood Flow Cytometry - ABNORMAL B-CELL POPULATION DETECTED, SEE COMMENT. Diagnosis Comment: There is an abnormal population of B-cells with expression of B-cell markers including CD23. CD20 expression is dim and light chain expression is too dim for accurate assessment of clonality. There is no significant expression of CD5 or CD10. Markers for hairy cell leukemia are negative. Review of the peripheral blood reveals a lymphocytosis with predominately small lymphocytes with coarse chromatin and scant cytoplasm. 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 The expression of CD23, dim CD20, dim light chains, and morphology are most suggestive of chronic lymphocytic leukemia, however, CD5 is negative which is rather uncommon. Correlation with cytogenetics may be of some utility.   CT chest, abdomen and pelvis on 09/06/2014 IMPRESSION: No evidence of mass, lymphadenopathy, or other acute findings.  Mild hepatic steatosis and colonic diverticulosis incidentally noted.   RADIOGRAPHIC STUDIES: I have personally reviewed the radiological images as listed and agreed with the findings in the report. No results found.   ASSESSMENT & PLAN: 77 y.o. Caucasian female with past medical history of brain aneurysm, hypertension, borderline diabetes, macular degeneration, hearing loss, who was incidentally found to have leukocytosis on routine lab since 2015.  1. Chronic ascitic leukemia (CLL), stage 0  2. HTN 3. AF 4. Immunizations  5. Depression   No problem-specific Assessment & Plan notes found for this encounter.   No orders of the defined types were placed in this encounter.  All questions were answered. The patient knows to call the clinic with any problems, questions or concerns. No barriers to learning was detected. I spent {CHL ONC TIME VISIT - BWLSL:[id number]} counseling the patient face to face. The total time spent in the appointment was {CHL ONC TIME VISIT - LXBWI:[id number]} and more than 50% was on counseling and review of test results     Alla Feeling, NP 04/21/18

## 2018-04-22 ENCOUNTER — Telehealth: Payer: Self-pay | Admitting: Nurse Practitioner

## 2018-04-22 NOTE — Telephone Encounter (Signed)
Spoke to patient regarding upcoming aug appts per 8/21 sch message.

## 2018-04-24 ENCOUNTER — Non-Acute Institutional Stay (SKILLED_NURSING_FACILITY): Payer: Medicare Other | Admitting: Adult Health

## 2018-04-24 ENCOUNTER — Encounter: Payer: Self-pay | Admitting: Adult Health

## 2018-04-24 DIAGNOSIS — Z9889 Other specified postprocedural states: Secondary | ICD-10-CM

## 2018-04-24 DIAGNOSIS — R5383 Other fatigue: Secondary | ICD-10-CM | POA: Diagnosis not present

## 2018-04-24 DIAGNOSIS — K5901 Slow transit constipation: Secondary | ICD-10-CM

## 2018-04-24 DIAGNOSIS — M5127 Other intervertebral disc displacement, lumbosacral region: Secondary | ICD-10-CM | POA: Diagnosis not present

## 2018-04-24 DIAGNOSIS — Z8739 Personal history of other diseases of the musculoskeletal system and connective tissue: Secondary | ICD-10-CM

## 2018-04-24 DIAGNOSIS — F419 Anxiety disorder, unspecified: Secondary | ICD-10-CM | POA: Diagnosis not present

## 2018-04-24 NOTE — Progress Notes (Addendum)
Location:  [facility] of Service:  SNF (31) Provider:   Cindi Carbon, [place] [phone]   Crist Infante, MD  Patient Care Team: Crist Infante, MD as PCP - General (Internal Medicine) Sherren Mocha, MD as PCP - Cardiology (Cardiology) Crist Infante, MD (Internal Medicine) Newton Pigg, MD as Consulting Physician (Obstetrics and Gynecology) Truitt Merle, MD as Consulting Physician (Hematology) Leta Baptist, MD as Consulting Physician (Otolaryngology) Sherlynn Stalls, MD as Consulting Physician (Ophthalmology) Paulla Dolly Tamala Fothergill, DPM as Consulting Physician (Podiatry) Harriett Sine, MD as Consulting Physician (Dermatology) Erroll Luna, MD as Consulting Physician (General Surgery)  Extended Emergency Contact Information Primary Emergency Contact: Holt,Cybil  [place] Phone: [phone] Relation: Other  Code Status:  DNR Goals of care: Advanced Directive information Advanced Directives 04/14/2018  Does Patient Have a Medical Advance Directive? Yes  Type of Advance Directive Out of facility DNR (pink MOST or yellow form);Healthcare Power of Attorney  Does patient want to make changes to medical advance directive? No - Patient declined  Copy of [place] in Chart? Yes  Would patient like information on creating a medical advance directive? -  Pre-existing out of facility DNR order (yellow form or pink MOST form) Yellow form placed in chart (order not valid for inpatient use)     Chief Complaint  Patient presents with  . Acute Visit    lethargy    HPI:  Pt is a 77 y.o. female seen today for an acute visit for due to lethargy.  She has a hx of a herniated nucleus pulposus L4-5 and L5-S1 with right lumbar radiculopathy and underwent a surgical decompression of L4-5 and L5-S1 on 04/07/18.  Since admission she has experienced periods of lethargy.  She has a hx of anxiety and depression and  prior to her admission was placed on valium. She was previously on ativan due to a hx of dementia with depression, and anxiety. Dr. Mariea Clonts placed her on Cymbalta on 8/14 due to pain and depression. This was stopped on 8/21.  Norco was stopped one week ago as well due to depression. She saw Dr. Ellene Route with neurosurgery on 8/22 and he suggested taking her off valium due to sedation and trying ultram for pain as her pain was not well controlled with tylenol and ibuprofen. Her care giver is at the bedside and states that she did much better at home when she was on ativan.  There are also reports of straining to have bowel movements with small amts of blood in the toilet. Prior to her admission she was taking miralax.    Past Medical History:  Diagnosis Date  . Anxiety   . Arthritis   . Cerebral aneurysm    s/p endovascular colling 2008  . CLL (chronic lymphocytic leukemia) ([place])   . Depression   . Hearing loss   . Macular degeneration   . Neuropathy   . Paroxysmal atrial fibrillation ([facility])   . Pneumonia    08-04-17  . PONV (postoperative nausea and vomiting)   . Thyroid disease    Past Surgical History:  Procedure Laterality Date  . ANEURYSM COILING    . CATARACT EXTRACTION     bilateral  . CERVICAL CONE BIOPSY    . COLONOSCOPY    . EVALUATION UNDER ANESTHESIA WITH HEMORRHOIDECTOMY N/A 09/03/2017   Procedure: EXAM UNDER ANESTHESIA, EXCISION OF ANAL LESION, POSSIBLE HEMORRHOIDECTOMY;  Surgeon: Ileana Roup, MD;  Location: [facility];  Service:  General;  Laterality: N/A;  . LUMBAR LAMINECTOMY/DECOMPRESSION MICRODISCECTOMY Right 04/07/2018   Procedure: Right Lumbar Four-Five Lumbar Five-Sacral One Laminectomy/Microdiscectomy;  Surgeon: Kristeen Miss, MD;  Location: [facility];  Service: Neurosurgery;  Laterality: Right;  Right Lumbar Four-Five Lumbar Five-Sacral One Laminectomy/Microdiscectomy  . THYROIDECTOMY    . TONSILLECTOMY      Allergies  Allergen Reactions  . Ambien [Zolpidem Tartrate]  Other (See Comments)    UNSPECIFIED REACTION   . Fioricet-Codeine [Butalbital-Apap-Caff-Cod]     UNSPECIFIED REACTION   . Macrobid [Nitrofurantoin]     UNSPECIFIED REACTION   . Percocet [Oxycodone-Acetaminophen] Nausea And Vomiting  . Codeine Other (See Comments)    About 50 years ago, took Codeine for scratched cornea while pregnant, and it made her extremely sedated.  Marland Kitchen Keflex [Cephalexin] Rash    Outpatient Encounter Medications as of 04/24/2018  Medication Sig  . acetaminophen (TYLENOL) 500 MG tablet Take 500 mg by mouth every 8 (eight) hours.   Marland Kitchen buPROPion (WELLBUTRIN XL) 300 MG 24 hr tablet Take 450 mg by mouth daily.   . Calcium Carbonate-Vitamin D 600-400 MG-UNIT per tablet Take 1 tablet by mouth daily.  . COMBIGAN 0.2-0.5 % ophthalmic solution Place 1 drop into the left eye 2 (two) times daily.   . diazepam (VALIUM) 2 MG tablet Take 2 mg by mouth in the morning and take 2 mg by mouth mid-afternoon. Take 4 mg by mouth at bedtime.   Marland Kitchen diltiazem (CARDIZEM CD) 180 MG 24 hr capsule Take 180 mg by mouth daily.  Marland Kitchen donepezil (ARICEPT) 5 MG tablet Take 5 mg by mouth at bedtime.  . hydrocortisone (ANUSOL-HC) 25 MG suppository Place 25 mg rectally 2 (two) times daily as needed for hemorrhoids or anal itching.  Marland Kitchen ibuprofen (ADVIL,MOTRIN) 400 MG tablet Take 1 tablet (400 mg total) by mouth every 8 (eight) hours as needed for moderate pain.  Marland Kitchen lamoTRIgine (LAMICTAL) 25 MG tablet Take 25 mg by mouth every morning. Take 50 mg every evening  . latanoprost (XALATAN) 0.005 % ophthalmic solution Place 1 drop into the left eye at bedtime.   . meclizine (ANTIVERT) 25 MG tablet Take 25 mg by mouth 2 (two) times daily as needed for dizziness.  . Melatonin 5 MG TABS Take 5 mg by mouth at bedtime.  . memantine (NAMENDA) 5 MG tablet Take 5 mg by mouth daily.  . Multiple Vitamins-Minerals (MULTIVITAMINS THER. W/MINERALS) TABS tablet Take 1 tablet by mouth daily.  . Multiple Vitamins-Minerals (PRESERVISION  AREDS) CAPS Take 1 capsule by mouth daily.  Marland Kitchen omeprazole (PRILOSEC) 20 MG capsule Take 20 mg by mouth daily.  . ondansetron (ZOFRAN) 4 MG tablet Take 4 mg by mouth every 4 (four) hours as needed for nausea or vomiting.  . phenylephrine-shark liver oil-mineral oil-petrolatum (PREPARATION H) 0.25-3-14-71.9 % rectal ointment Place 1 application rectally 4 (four) times daily as needed for hemorrhoids.  . pravastatin (PRAVACHOL) 40 MG tablet Take 40 mg by mouth daily.   Marland Kitchen PREMPRO 0.625-2.5 MG tablet Take 1 tablet by mouth daily.   . Probiotic Product (ALIGN) 4 MG CAPS Take 4 mg by mouth daily.   . [DISCONTINUED] HYDROcodone-acetaminophen (NORCO/VICODIN) 5-325 MG tablet Take 1 tablet by mouth every 6 (six) hours as needed for moderate pain.   No facility-administered encounter medications on file as of 04/24/2018.     Review of Systems  Constitutional: Positive for fatigue. Negative for activity change, appetite change, chills, diaphoresis, fever and unexpected weight change.  HENT: Negative for congestion.  Hoh  Eyes: Negative for discharge and visual disturbance.       Low vision  Respiratory: Negative for cough, shortness of breath and wheezing.   Cardiovascular: Negative for chest pain, palpitations and leg swelling.  Gastrointestinal: Positive for anal bleeding and constipation. Negative for abdominal distention, abdominal pain and diarrhea.  Genitourinary: Negative for difficulty urinating and dysuria.  Musculoskeletal: Positive for back pain and gait problem. Negative for arthralgias, joint swelling and myalgias.  Neurological: Positive for weakness. Negative for dizziness, tremors, seizures, syncope, facial asymmetry, speech difficulty, light-headedness, numbness and headaches.  Psychiatric/Behavioral: Positive for agitation and confusion. Negative for behavioral problems.    Immunization History  Administered Date(s) Administered  . Influenza-Unspecified 06/27/2016  .  Pneumococcal Conjugate-13 07/21/2013, 04/15/2016  . Pneumococcal Polysaccharide-23 03/14/2010  . Td 03/09/2010  . Tdap 12/19/2016  . Zoster 08/02/2005, 03/14/2010  . Zoster Recombinat (Shingrix) 06/18/2017, 10/22/2017   Pertinent  Health Maintenance Due  Topic Date Due  . FOOT EXAM  02/12/1951  . OPHTHALMOLOGY EXAM  02/12/1951  . INFLUENZA VACCINE  04/02/2018  . HEMOGLOBIN A1C  05/14/2018  . URINE MICROALBUMIN  11/12/2018  . DEXA SCAN  Completed  . PNA vac Low Risk Adult  Completed   No flowsheet data found. Functional Status Survey:    Vitals:   04/24/18 0931  BP: 133/85  Pulse: 98  Resp: 16  Temp: 98.5 F (36.9 C)  SpO2: 93%   There is no height or weight on file to calculate BMI. Physical Exam  Constitutional: No distress.  HENT:  Head: Normocephalic and atraumatic.  Neck: No JVD present.  Cardiovascular: Normal rate and regular rhythm.  No murmur heard. Pulmonary/Chest: Effort normal and breath sounds normal. No respiratory distress. She has no wheezes.  Abdominal: Soft. Bowel sounds are normal. She exhibits no distension.  Neurological: She is alert. No cranial nerve deficit.  BLE strength 4/5, oriented x 2  Skin: Skin is warm and dry. She is not diaphoretic.  Psychiatric: She has a normal mood and affect.  Nursing note and vitals reviewed.   Labs reviewed: Recent Labs    09/01/17 0908 10/24/17 1241 11/11/17 02/24/18 04/07/18 1053  NA 140 140 142 140 144  K 4.2 4.0 3.8 4.0 4.3  CL 110 106  --   --  108  CO2 23 25  --   --  26  GLUCOSE 138* 130  --   --  89  BUN 12 9 12 9 11   CREATININE 0.88 0.83 0.8 0.9 0.95  CALCIUM 9.0 9.3  --   --  9.0   Recent Labs    04/25/17 1113 09/01/17 0908 10/24/17 1241 11/11/17  AST 16 28 14 16   ALT 14 16 15 13   ALKPHOS 60 58 67 65  BILITOT 0.41 0.6 0.3  --   PROT 6.7 6.5 6.4  --   ALBUMIN 3.3* 3.6 3.4*  --    Recent Labs    07/28/17 1119 09/01/17 0908 10/24/17 1241 11/11/17 04/07/18 1053 04/20/18  WBC  19.8* 20.5* 17.5* 19.5 35.0* 26.3  NEUTROABS 3.9 3.9 4.5  --   --   --   HGB 13.6 13.7 12.9 13.8 13.4 13.7  HCT 40.8 41.1 39.3 42 41.3 41  MCV 96.7 96.9 97.2  --  102.2*  --   PLT 171 182 193 213 206 233   Lab Results  Component Value Date   TSH 1.78 11/11/2017   Lab Results  Component Value Date   HGBA1C 5.6 11/11/2017  Lab Results  Component Value Date   CHOL 146 11/11/2017   HDL 42 11/11/2017   LDLCALC 63 11/11/2017   TRIG 207 (A) 11/11/2017    Significant Diagnostic Results in last 30 days:  Dg Lumbar Spine 2-3 Views  Result Date: 04/07/2018 CLINICAL DATA:  77 year old female for L4-5 laminectomy. Initial encounter. EXAM: LUMBAR SPINE - 2-3 VIEW COMPARISON:  12/21/2017 MR. FINDINGS: Two intraoperative lateral views of the lumbar spine submitted for review. Level assignment as per prior MR. First film (04/07/2018 3:50 p.m.) reveals metallic probe along the inferior aspect of the L4 spinous process. Second film (04/07/2018 3:56 p.m.) reveals metallic probe directed towards the L4-5 disc space. Surgical sponge in place. IMPRESSION: Localization L4-5. Electronically Signed   By: Genia Del M.D.   On: 04/07/2018 16:31    Assessment/Plan  1. Lethargy Likely due to valium as the norco and cymbalta have been discontinued and timing seems to correlate with valium admin  2. Anxiety D/C Valium Resume ativan at 0.5 mg BID to start this evening and qd prn anxiety  3. Herniated nucleus pulposus of lumbosacral region S/p discectomy seen by Dr. Ellene Route yesterday Slow progress with therapy. Ambulates short distances with a walker but longer distances she uses the WC.   4. S/P discectomy for herniated nucleus pulposus Continue scheduled tylenol Add ultram 25-50 mg q 8 hrs prn pain per Dr. Ellene Route recommendation as the tylenol does not seem to be working. She does have a noted sensitivity to codeine which also causes lethargy but hopefully the reduced dosage will help prevent this  s/e  5. Slow transit constipation Start Miralax 17 grams q d with 8 oz of fluid   Family/ staff Communication: staff/resident  Labs/tests ordered:  NA

## 2018-05-01 ENCOUNTER — Inpatient Hospital Stay (HOSPITAL_BASED_OUTPATIENT_CLINIC_OR_DEPARTMENT_OTHER): Payer: Medicare Other | Admitting: Nurse Practitioner

## 2018-05-01 ENCOUNTER — Telehealth: Payer: Self-pay | Admitting: Hematology

## 2018-05-01 ENCOUNTER — Encounter: Payer: Self-pay | Admitting: Nurse Practitioner

## 2018-05-01 ENCOUNTER — Inpatient Hospital Stay: Payer: Medicare Other

## 2018-05-01 VITALS — BP 120/71 | HR 69 | Temp 98.3°F | Resp 17 | Ht 65.0 in | Wt 159.9 lb

## 2018-05-01 DIAGNOSIS — I10 Essential (primary) hypertension: Secondary | ICD-10-CM | POA: Diagnosis not present

## 2018-05-01 DIAGNOSIS — C9110 Chronic lymphocytic leukemia of B-cell type not having achieved remission: Secondary | ICD-10-CM | POA: Diagnosis not present

## 2018-05-01 DIAGNOSIS — F419 Anxiety disorder, unspecified: Secondary | ICD-10-CM | POA: Diagnosis not present

## 2018-05-01 DIAGNOSIS — I4891 Unspecified atrial fibrillation: Secondary | ICD-10-CM

## 2018-05-01 DIAGNOSIS — G629 Polyneuropathy, unspecified: Secondary | ICD-10-CM

## 2018-05-01 DIAGNOSIS — E079 Disorder of thyroid, unspecified: Secondary | ICD-10-CM

## 2018-05-01 DIAGNOSIS — C9111 Chronic lymphocytic leukemia of B-cell type in remission: Secondary | ICD-10-CM | POA: Diagnosis present

## 2018-05-01 DIAGNOSIS — F329 Major depressive disorder, single episode, unspecified: Secondary | ICD-10-CM | POA: Diagnosis not present

## 2018-05-01 DIAGNOSIS — Z79899 Other long term (current) drug therapy: Secondary | ICD-10-CM | POA: Diagnosis not present

## 2018-05-01 LAB — CBC WITH DIFFERENTIAL/PLATELET
BASOS ABS: 0 10*3/uL (ref 0.0–0.1)
Basophils Relative: 0 %
EOS PCT: 1 %
Eosinophils Absolute: 0.2 10*3/uL (ref 0.0–0.5)
HCT: 40 % (ref 34.8–46.6)
Hemoglobin: 13.3 g/dL (ref 11.6–15.9)
LYMPHS ABS: 13.9 10*3/uL — AB (ref 0.9–3.3)
LYMPHS PCT: 71 %
MCH: 32.9 pg (ref 25.1–34.0)
MCHC: 33.1 g/dL (ref 31.5–36.0)
MCV: 99.3 fL (ref 79.5–101.0)
Monocytes Absolute: 0.8 10*3/uL (ref 0.1–0.9)
Monocytes Relative: 4 %
NEUTROS ABS: 4.8 10*3/uL (ref 1.5–6.5)
NEUTROS PCT: 24 %
Platelets: 213 10*3/uL (ref 145–400)
RBC: 4.02 MIL/uL (ref 3.70–5.45)
RDW: 13.8 % (ref 11.2–14.5)
WBC: 19.8 10*3/uL — AB (ref 3.9–10.3)

## 2018-05-01 LAB — COMPREHENSIVE METABOLIC PANEL
ALBUMIN: 3.5 g/dL (ref 3.5–5.0)
ALT: 17 U/L (ref 0–44)
ANION GAP: 10 (ref 5–15)
AST: 15 U/L (ref 15–41)
Alkaline Phosphatase: 84 U/L (ref 38–126)
BUN: 12 mg/dL (ref 8–23)
CHLORIDE: 108 mmol/L (ref 98–111)
CO2: 25 mmol/L (ref 22–32)
Calcium: 9.6 mg/dL (ref 8.9–10.3)
Creatinine, Ser: 0.91 mg/dL (ref 0.44–1.00)
GFR calc non Af Amer: 58 mL/min — ABNORMAL LOW (ref >60)
GLUCOSE: 99 mg/dL (ref 70–99)
POTASSIUM: 3.8 mmol/L (ref 3.5–5.1)
SODIUM: 143 mmol/L (ref 135–145)
Total Bilirubin: 0.2 mg/dL — ABNORMAL LOW (ref 0.3–1.2)
Total Protein: 6.6 g/dL (ref 6.5–8.1)

## 2018-05-01 NOTE — Telephone Encounter (Signed)
Appts scheduled AVS/Calendar printed per 8/30 los

## 2018-05-01 NOTE — Progress Notes (Addendum)
[place]  [phone] [phone]  Clinic Follow up Note   Patient Care Team: Crist Infante, MD as PCP - General (Internal Medicine) Sherren Mocha, MD as PCP - Cardiology (Cardiology) Crist Infante, MD (Internal Medicine) Newton Pigg, MD as Consulting Physician (Obstetrics and Gynecology) Truitt Merle, MD as Consulting Physician (Hematology) Leta Baptist, MD as Consulting Physician (Otolaryngology) Sherlynn Stalls, MD as Consulting Physician (Ophthalmology) Regal, Tamala Fothergill, DPM as Consulting Physician (Podiatry) Harriett Sine, MD as Consulting Physician (Dermatology) Erroll Luna, MD as Consulting Physician (General Surgery) 05/01/2018  CHIEF COMPLAINTS Follow-up CLL, stage 0  CURRENT THERAPY: observation   INTERVAL HISTORY: Bethany Oconnor returns for lab and f/u as scheduled. She underwent back surgery for L4-L5 disc herniation on 04/07/18. She had uncomplicated post op and was discharged home to [facility]. She continues PT daily. Back pain is managed. Mobility and strength are improving per her caregiver. She denies fever, night sweats, or weight loss. No recent fever or chills. Denies bleeding. No cough, chest pain, or dyspnea. She noticed asymmetry in her neck this week, no pain. Denies sore throat, mouth/dental pain.     MEDICAL HISTORY:  Past Medical History:  Diagnosis Date  . Anxiety   . Arthritis   . Cerebral aneurysm    s/p endovascular colling 2008  . CLL (chronic lymphocytic leukemia) ([place])   . Depression   . Hearing loss   . Macular degeneration   . Neuropathy   . Paroxysmal atrial fibrillation (HCC)   . Pneumonia    08-04-17  . PONV (postoperative nausea and vomiting)   . Thyroid disease     SURGICAL HISTORY: Past Surgical History:  Procedure Laterality Date  . ANEURYSM COILING    . CATARACT EXTRACTION     bilateral  . CERVICAL CONE BIOPSY    . COLONOSCOPY    . EVALUATION UNDER ANESTHESIA WITH HEMORRHOIDECTOMY  N/A 09/03/2017   Procedure: EXAM UNDER ANESTHESIA, EXCISION OF ANAL LESION, POSSIBLE HEMORRHOIDECTOMY;  Surgeon: Ileana Roup, MD;  Location: [facility];  Service: General;  Laterality: N/A;  . LUMBAR LAMINECTOMY/DECOMPRESSION MICRODISCECTOMY Right 04/07/2018   Procedure: Right Lumbar Four-Five Lumbar Five-Sacral One Laminectomy/Microdiscectomy;  Surgeon: Kristeen Miss, MD;  Location: [facility];  Service: Neurosurgery;  Laterality: Right;  Right Lumbar Four-Five Lumbar Five-Sacral One Laminectomy/Microdiscectomy  . THYROIDECTOMY    . TONSILLECTOMY      I have reviewed the social history and family history with the patient and they are unchanged from previous note.  ALLERGIES:  is allergic to [employer] tartrate]; fioricet-codeine [butalbital-apap-caff-cod]; macrobid [nitrofurantoin]; percocet [oxycodone-acetaminophen]; codeine; and keflex [cephalexin].  MEDICATIONS:  Current Outpatient Medications  Medication Sig Dispense Refill  . acetaminophen (TYLENOL) 500 MG tablet Take 500 mg by mouth every 8 (eight) hours.     Marland Kitchen buPROPion (WELLBUTRIN XL) 300 MG 24 hr tablet Take 450 mg by mouth daily.     . Calcium Carbonate-Vitamin D 600-400 MG-UNIT per tablet Take 1 tablet by mouth daily.    . COMBIGAN 0.2-0.5 % ophthalmic solution Place 1 drop into the left eye 2 (two) times daily.     Marland Kitchen diltiazem (CARDIZEM CD) 180 MG 24 hr capsule Take 180 mg by mouth daily.    Marland Kitchen donepezil (ARICEPT) 5 MG tablet Take 5 mg by mouth at bedtime.    Marland Kitchen HYDROcodone-acetaminophen (NORCO/VICODIN) 5-325 MG tablet Take 1 tablet by mouth every 6 (six) hours as needed for moderate pain.    . hydrocortisone (ANUSOL-HC) 25 MG suppository Place 25 mg  rectally 2 (two) times daily as needed for hemorrhoids or anal itching.    Marland Kitchen ibuprofen (ADVIL,MOTRIN) 400 MG tablet Take 1 tablet (400 mg total) by mouth every 8 (eight) hours as needed for moderate pain. 60 tablet 0  . lamoTRIgine (LAMICTAL) 25 MG tablet Take 25 mg by mouth every  morning. Take 50 mg every evening    . latanoprost (XALATAN) 0.005 % ophthalmic solution Place 1 drop into the left eye at bedtime.     Marland Kitchen LORazepam (ATIVAN) 0.5 MG tablet Take 0.5 mg by mouth 2 (two) times daily. And q 8 hrs prn    . meclizine (ANTIVERT) 25 MG tablet Take 25 mg by mouth 2 (two) times daily as needed for dizziness.    . Melatonin 5 MG TABS Take 5 mg by mouth at bedtime.    . memantine (NAMENDA) 5 MG tablet Take 5 mg by mouth daily.    . Multiple Vitamins-Minerals (MULTIVITAMINS THER. W/MINERALS) TABS tablet Take 1 tablet by mouth daily.    . Multiple Vitamins-Minerals (PRESERVISION AREDS) CAPS Take 1 capsule by mouth daily.    Marland Kitchen omeprazole (PRILOSEC) 20 MG capsule Take 20 mg by mouth daily.    . ondansetron (ZOFRAN) 4 MG tablet Take 4 mg by mouth every 4 (four) hours as needed for nausea or vomiting.    . phenylephrine-shark liver oil-mineral oil-petrolatum (PREPARATION H) 0.25-3-14-71.9 % rectal ointment Place 1 application rectally 4 (four) times daily as needed for hemorrhoids.    . pravastatin (PRAVACHOL) 40 MG tablet Take 40 mg by mouth daily.     Marland Kitchen PREMPRO 0.625-2.5 MG tablet Take 1 tablet by mouth daily.   3  . Probiotic Product (ALIGN) 4 MG CAPS Take 4 mg by mouth daily.     . traMADol (ULTRAM) 50 MG tablet Take 25-50 mg by mouth every 8 (eight) hours as needed. 25 mg q 8 prn moderate pain, and 50 mg q 8 hrs prn severe pain     No current facility-administered medications for this visit.     PHYSICAL EXAMINATION: ECOG PERFORMANCE STATUS: 3 - Symptomatic, >50% confined to bed  Vitals:   05/01/18 1450  BP: 120/71  Pulse: 69  Resp: 17  Temp: 98.3 F (36.8 C)  SpO2: 95%   Filed Weights   05/01/18 1450  Weight: 159 lb 14.4 oz (72.5 kg)    GENERAL:alert, no distress and comfortable SKIN: skin color, texture, turgor are normal, no rashes or significant lesions EYES: sclera clear OROPHARYNX:no thrush or ulcers NECK: left low neck fullness without palpable mass  or LN, non-tender  LYMPH:  no palpable cervical, axillary, or inguinal lymphadenopathy LUNGS: clear to auscultation with normal breathing effort HEART: regular rate & rhythm, no lower extremity edema ABDOMEN:abdomen soft, non-tender and normal bowel sounds Musculoskeletal:no cyanosis of digits and no clubbing  NEURO: alert & oriented x 3 with fluent speech  LABORATORY DATA:  I have reviewed the data as listed CBC Latest Ref Rng & Units 05/01/2018 04/20/2018 04/07/2018  WBC 3.9 - 10.3 K/uL 19.8(H) 26.3 35.0(H)  Hemoglobin 11.6 - 15.9 g/dL 13.3 13.7 13.4  Hematocrit 34.8 - 46.6 % 40.0 41 41.3  Platelets 145 - 400 K/uL 213 233 206     CMP Latest Ref Rng & Units 05/01/2018 04/07/2018 02/24/2018  Glucose 70 - 99 mg/dL 99 89 -  BUN 8 - 23 mg/dL 12 11 9   Creatinine 0.44 - 1.00 mg/dL 0.91 0.95 0.9  Sodium 135 - 145 mmol/L 143 144 140  Potassium 3.5 - 5.1 mmol/L 3.8 4.3 4.0  Chloride 98 - 111 mmol/L 108 108 -  CO2 22 - 32 mmol/L 25 26 -  Calcium 8.9 - 10.3 mg/dL 9.6 9.0 -  Total Protein 6.5 - 8.1 g/dL 6.6 - -  Total Bilirubin 0.3 - 1.2 mg/dL 0.2(L) - -  Alkaline Phos 38 - 126 U/L 84 - -  AST 15 - 41 U/L 15 - -  ALT 0 - 44 U/L 17 - -   Flow cytometry 08/16/2014 Peripheral Blood Flow Cytometry - ABNORMAL B-CELL POPULATION DETECTED, SEE COMMENT. Diagnosis Comment: There is an abnormal population of B-cells with expression of B-cell markers including CD23. CD20 expression is dim and light chain expression is too dim for accurate assessment of clonality. There is no significant expression of CD5 or CD10. Markers for hairy cell leukemia are negative. Review of the peripheral blood reveals a lymphocytosis with predominately small lymphocytes with coarse chromatin and scant cytoplasm. 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 The expression of CD23, dim CD20, dim light chains, and morphology are most suggestive of chronic lymphocytic leukemia, however, CD5 is  negative which is rather uncommon. Correlation with cytogenetics may be of some utility.    RADIOGRAPHIC STUDIES: I have personally reviewed the radiological images as listed and agreed with the findings in the report. No results found.   ASSESSMENT & PLAN: 77 y.o. Caucasian female with past medical history of brain aneurysm, hypertension, borderline diabetes, macular degeneration, hearing loss, who was incidentally found to have leukocytosis on routine lab since 2015.  1. Chronic lymphocytic leukemia (CLL), stage 0  -Her WBC and abs lymphocytes fluctuate recently, likely due to stress of recent surgery, but overall stable. Recommend to continue monitoring. CMP is unremarkable. -she has left low neck fullness, without palpable node. Possibly a lipoma. We reviewed s/sx to monitor. She will call if the area changes, or she develops pain, swelling, fever, chills, or other consitutional symptoms. Her caregiver was present and agrees to monitor.  -will see her back in 3 months for lab and monitoring  2. Hypertension and other comorbidity -followed by Dr. Joylene Draft PCP q6 months   3. AF -on Pradaxa, follow-up with her cardiologist.  4. Immunizations -previously discussed that CLL patients has compromised immune system, previously encouraged to keep her vaccination up-to-date  5. Depression -due to losing her husband in 2018  PLAN: -Labs reviewed, CBC and lymphs are stable overall, continue observation -Monitor left neck fullness  -Return for lab, f/u with Dr. Burr Medico in 3 months   All questions were answered. The patient knows to call the clinic with any problems, questions or concerns. No barriers to learning was detected.    Alla Feeling, NP 05/01/18    Addendum I have seen the patient, examined her. I agree with the assessment and and plan and have edited the notes.   Bethany Oconnor is recovering well from her recent spinal surgery. I have reviewed her recent lab.  Her leukocytosis  got significantly worse around her surgery, likely related to the acute illness and stress. Her WBC has dropped to 19.8K with ALC 13.9K, which is closely to her lab previously baseline. Her exam was negative for adenopathy, but she has significant left [place] fullness without palpable mass, likely fatty tissue or mild edema. Will watch clinically. Plan to see her back in 3 months.  Truitt Merle  05/01/2018

## 2018-05-08 ENCOUNTER — Non-Acute Institutional Stay (SKILLED_NURSING_FACILITY): Payer: Medicare Other | Admitting: Adult Health

## 2018-05-08 DIAGNOSIS — M5127 Other intervertebral disc displacement, lumbosacral region: Secondary | ICD-10-CM | POA: Diagnosis not present

## 2018-05-08 DIAGNOSIS — M6289 Other specified disorders of muscle: Secondary | ICD-10-CM | POA: Diagnosis not present

## 2018-05-11 ENCOUNTER — Encounter: Payer: Self-pay | Admitting: Adult Health

## 2018-05-11 NOTE — Progress Notes (Signed)
Location:  [facility]up[facility] psychologist of Service:  SNF (31) Provider:   Cindi Carbon, [place] [phone]  Crist Infante, MD  Patient Care Team: Crist Infante, MD as PCP - General (Internal Medicine) Sherren Mocha, MD as PCP - Cardiology (Cardiology) Crist Infante, MD (Internal Medicine) Newton Pigg, MD as Consulting Physician (Obstetrics and Gynecology) Truitt Merle, MD as Consulting Physician (Hematology) Leta Baptist, MD as Consulting Physician (Otolaryngology) Sherlynn Stalls, MD as Consulting Physician (Ophthalmology) Paulla Dolly Tamala Fothergill, DPM as Consulting Physician (Podiatry) Harriett Sine, MD as Consulting Physician (Dermatology) Erroll Luna, MD as Consulting Physician (General Surgery)  Extended Emergency Contact Information Primary Emergency Contact: Holt,Cybil  [place] Phone: [phone] Relation: Other  Code Status:  DNR Goals of care: Advanced Directive information Advanced Directives 04/14/2018  Does Patient Have a Medical Advance Directive? Yes  Type of Advance Directive Out of facility DNR (pink MOST or yellow form);Healthcare Power of Attorney  Does patient want to make changes to medical advance directive? No - Patient declined  Copy of [place] in Chart? Yes  Would patient like information on creating a medical advance directive? -  Pre-existing out of facility DNR order (yellow form or pink MOST form) Yellow form placed in chart (order not valid for inpatient use)     Chief Complaint  Patient presents with  . Acute Visit    right buttock pain    HPI:  Pt is a 77 y.o. female seen today for an acute visit for right buttock pain. She has a hx of radicular pain from the lumbar area to the right leg. On 04/07/18, Dr. Ellene Route performed a microdiscectomy L4-5 and L5-S1 on the right and decompression of the L5 and S1 nerve roots.  She is in rehab for physical therapy and her progression  has been protracted due to underlying dementia, and pain control issues. She has tried Norco but anything with codeine makes her sedated and nauseous. She tried ultram but it did not help. Tylenol and ibuprofen also provide minimal relief. She has difficulty rating her pain but describes it as severe. In retrospect her pain is improved from prior to surgery as it is only in the right buttock area at this point and not going down the leg. She denies any numbness or tinging. She also reports a "lump" in the area where her buttock is painful and "burns". With therapy she is walking very short distances and has personal caregivers to assist her. She spends much of the day in the wheelchair.   Past Medical History:  Diagnosis Date  . Anxiety   . Arthritis   . Cerebral aneurysm    s/p endovascular colling 2008  . CLL (chronic lymphocytic leukemia) ([place])   . Depression   . Hearing loss   . Macular degeneration   . Neuropathy   . Paroxysmal atrial fibrillation (HCC)   . Pneumonia    08-04-17  . PONV (postoperative nausea and vomiting)   . Thyroid disease    Past Surgical History:  Procedure Laterality Date  . ANEURYSM COILING    . CATARACT EXTRACTION     bilateral  . CERVICAL CONE BIOPSY    . COLONOSCOPY    . EVALUATION UNDER ANESTHESIA WITH HEMORRHOIDECTOMY N/A 09/03/2017   Procedure: EXAM UNDER ANESTHESIA, EXCISION OF ANAL LESION, POSSIBLE HEMORRHOIDECTOMY;  Surgeon: Ileana Roup, MD;  Location: [facility];  Service: General;  Laterality: N/A;  . LUMBAR LAMINECTOMY/DECOMPRESSION MICRODISCECTOMY Right 04/07/2018  Procedure: Right Lumbar Four-Five Lumbar Five-Sacral One Laminectomy/Microdiscectomy;  Surgeon: Kristeen Miss, MD;  Location: [facility];  Service: Neurosurgery;  Laterality: Right;  Right Lumbar Four-Five Lumbar Five-Sacral One Laminectomy/Microdiscectomy  . THYROIDECTOMY    . TONSILLECTOMY      Allergies  Allergen Reactions  . Ambien [Zolpidem Tartrate] Other (See Comments)     UNSPECIFIED REACTION   . Fioricet-Codeine [Butalbital-Apap-Caff-Cod]     UNSPECIFIED REACTION   . Macrobid [Nitrofurantoin]     UNSPECIFIED REACTION   . Percocet [Oxycodone-Acetaminophen] Nausea And Vomiting  . Codeine Other (See Comments)    About 50 years ago, took Codeine for scratched cornea while pregnant, and it made her extremely sedated.  Marland Kitchen Keflex [Cephalexin] Rash    Outpatient Encounter Medications as of 05/08/2018  Medication Sig  . acetaminophen (TYLENOL) 500 MG tablet Take 500 mg by mouth every 8 (eight) hours.   Marland Kitchen buPROPion (WELLBUTRIN XL) 300 MG 24 hr tablet Take 450 mg by mouth daily.   . Calcium Carbonate-Vitamin D 600-400 MG-UNIT per tablet Take 1 tablet by mouth daily.  . COMBIGAN 0.2-0.5 % ophthalmic solution Place 1 drop into the left eye 2 (two) times daily.   Marland Kitchen diltiazem (CARDIZEM CD) 180 MG 24 hr capsule Take 180 mg by mouth daily.  Marland Kitchen donepezil (ARICEPT) 5 MG tablet Take 5 mg by mouth at bedtime.  . hydrocortisone (ANUSOL-HC) 25 MG suppository Place 25 mg rectally 2 (two) times daily as needed for hemorrhoids or anal itching.  Marland Kitchen ibuprofen (ADVIL,MOTRIN) 400 MG tablet Take 1 tablet (400 mg total) by mouth every 8 (eight) hours as needed for moderate pain.  Marland Kitchen lamoTRIgine (LAMICTAL) 25 MG tablet Take 25 mg by mouth every morning. Take 50 mg every evening  . latanoprost (XALATAN) 0.005 % ophthalmic solution Place 1 drop into the left eye at bedtime.   Marland Kitchen LORazepam (ATIVAN) 0.5 MG tablet Take 0.5 mg by mouth 2 (two) times daily. And q 8 hrs prn  . meclizine (ANTIVERT) 25 MG tablet Take 25 mg by mouth 2 (two) times daily as needed for dizziness.  . Multiple Vitamins-Minerals (MULTIVITAMINS THER. W/MINERALS) TABS tablet Take 1 tablet by mouth daily.  . Multiple Vitamins-Minerals (PRESERVISION AREDS) CAPS Take 1 capsule by mouth daily.  Marland Kitchen omeprazole (PRILOSEC) 20 MG capsule Take 20 mg by mouth daily.  . ondansetron (ZOFRAN) 4 MG tablet Take 4 mg by mouth every 4 (four) hours  as needed for nausea or vomiting.  . phenylephrine-shark liver oil-mineral oil-petrolatum (PREPARATION H) 0.25-3-14-71.9 % rectal ointment Place 1 application rectally 4 (four) times daily as needed for hemorrhoids.  . pravastatin (PRAVACHOL) 40 MG tablet Take 40 mg by mouth daily.   Marland Kitchen PREMPRO 0.625-2.5 MG tablet Take 1 tablet by mouth daily.   . Probiotic Product (ALIGN) 4 MG CAPS Take 4 mg by mouth daily.   . traMADol (ULTRAM) 50 MG tablet Take 25-50 mg by mouth every 8 (eight) hours as needed. 25 mg q 8 prn moderate pain, and 50 mg q 8 hrs prn severe pain  . Melatonin 5 MG TABS Take 5 mg by mouth at bedtime.  . memantine (NAMENDA) 5 MG tablet Take 5 mg by mouth daily.  . [DISCONTINUED] HYDROcodone-acetaminophen (NORCO/VICODIN) 5-325 MG tablet Take 1 tablet by mouth every 6 (six) hours as needed for moderate pain.   No facility-administered encounter medications on file as of 05/08/2018.     Review of Systems  Constitutional: Positive for activity change. Negative for appetite change, chills, diaphoresis, fatigue,  fever and unexpected weight change.  HENT: Negative for congestion.   Respiratory: Negative for cough, shortness of breath and wheezing.   Cardiovascular: Negative for chest pain, palpitations and leg swelling.  Gastrointestinal: Negative for abdominal distention, abdominal pain, constipation and diarrhea.  Genitourinary: Negative for difficulty urinating and dysuria.  Musculoskeletal: Positive for back pain and gait problem. Negative for arthralgias, joint swelling and myalgias.  Skin: Negative for rash.  Neurological: Positive for weakness. Negative for dizziness, tremors, seizures, syncope, facial asymmetry, speech difficulty, light-headedness, numbness and headaches.  Psychiatric/Behavioral: Positive for confusion. Negative for agitation and behavioral problems.    Immunization History  Administered Date(s) Administered  . Influenza-Unspecified 06/27/2016  . Pneumococcal  Conjugate-13 07/21/2013, 04/15/2016  . Pneumococcal Polysaccharide-23 03/14/2010  . Td 03/09/2010  . Tdap 12/19/2016  . Zoster 08/02/2005, 03/14/2010  . Zoster Recombinat (Shingrix) 06/18/2017, 10/22/2017   Pertinent  Health Maintenance Due  Topic Date Due  . FOOT EXAM  02/12/1951  . OPHTHALMOLOGY EXAM  02/12/1951  . INFLUENZA VACCINE  04/02/2018  . HEMOGLOBIN A1C  05/14/2018  . URINE MICROALBUMIN  11/12/2018  . DEXA SCAN  Completed  . PNA vac Low Risk Adult  Completed   No flowsheet data found. Functional Status Survey:    Vitals:   05/11/18 1318  BP: 130/75   There is no height or weight on file to calculate BMI. Physical Exam  Constitutional: No distress.  HENT:  Head: Normocephalic and atraumatic.  Neck: No JVD present.  Cardiovascular: Normal rate and regular rhythm.  No murmur heard. Pulmonary/Chest: Effort normal and breath sounds normal. No respiratory distress. She has no wheezes.  Abdominal: Soft. Bowel sounds are normal.  Musculoskeletal: She exhibits no edema or tenderness.  Decreased ROM at the right hip.  Right buttock with no swelling or tenderness. Palpable knot on the right buttock that is not warm or tender.   Neurological: She is alert.  Oriented to self and place but not time. Pleasant and able to f/c  Skin: Skin is warm and dry. She is not diaphoretic.  Psychiatric: She has a normal mood and affect.  Nursing note and vitals reviewed.   Labs reviewed: Recent Labs    10/24/17 1241  02/24/18 04/07/18 1053 05/01/18 1430  NA 140   < > 140 144 143  K 4.0   < > 4.0 4.3 3.8  CL 106  --   --  108 108  CO2 25  --   --  26 25  GLUCOSE 130  --   --  89 99  BUN 9   < > 9 11 12   CREATININE 0.83   < > 0.9 0.95 0.91  CALCIUM 9.3  --   --  9.0 9.6   < > = values in this interval not displayed.   Recent Labs    09/01/17 0908 10/24/17 1241 11/11/17 05/01/18 1430  AST 28 14 16 15   ALT 16 15 13 17   ALKPHOS 58 67 65 84  BILITOT 0.6 0.3  --  0.2*    PROT 6.5 6.4  --  6.6  ALBUMIN 3.6 3.4*  --  3.5   Recent Labs    09/01/17 0908 10/24/17 1241  04/07/18 1053 04/20/18 05/01/18 1430  WBC 20.5* 17.5*   < > 35.0* 26.3 19.8*  NEUTROABS 3.9 4.5  --   --   --  4.8  HGB 13.7 12.9   < > 13.4 13.7 13.3  HCT 41.1 39.3   < > 41.3 41  40.0  MCV 96.9 97.2  --  102.2*  --  99.3  PLT 182 193   < > 206 233 213   < > = values in this interval not displayed.   Lab Results  Component Value Date   TSH 1.78 11/11/2017   Lab Results  Component Value Date   HGBA1C 5.6 11/11/2017   Lab Results  Component Value Date   CHOL 146 11/11/2017   HDL 42 11/11/2017   LDLCALC 63 11/11/2017   TRIG 207 (A) 11/11/2017    Significant Diagnostic Results in last 30 days:  No results found.  Assessment/Plan   1. Herniated nucleus pulposus of lumbosacral region S/P surgery and remains with burning pain in the right buttock  Try neurontin 100 mg TID (Of note:has tried before and larger doses did cause sedation) Add lidocaine patch to right buttock on in the am and off in the pm Continue with heat or ice whichever is preferable  2. Muscle tightness Discussed with therapy dept, they will add modalities as indicated to address this issue If no improvement consider robaxin   Family/ staff Communication: resident and nurse  Labs/tests ordered:  NA

## 2018-07-14 ENCOUNTER — Ambulatory Visit: Payer: Self-pay | Admitting: Surgery

## 2018-07-14 NOTE — H&P (Signed)
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 She has been taking narcotic analgesics. She has been taken off her Pradaxa as she's been taking Motrin for back pain and a told her not to take Pradaxa anymore. She has had some intermittent bouts of constipation for which she has been taking when necessary MiraLAX 4. With all this, she developed a couple episodes of pink discharge found on the tissue paper after wiping following bowel movements. Given this and her history, she was brought back for follow-up. She denies any complaints today. She denies any tissue prolapse or anal canal or concerning findings there. Residing at [facility] She is here today for follow-up. Last week, there is an ulcerated area around her anus that she was seen by her PCP for and told she may have herpes. She was prescribed Valtrex. She denies any bleeding or pain. She denies any fever/chills. She denies any drainage. She notes no real improvement in this area since starting Valtrex  PMH: Atrial fibrillation (previously oral anticoagulation per patient - Pradaxa - now off) hypertension, depression, hyperlipidemia (managed on pravastatin) PSH: Only anorectal procedure was the procedure noted above; back surgery FHx: Denies FHx of malignancy Social: Denies use of tobacco/EtOH/drugs ROS: A comprehensive 10 system review of systems was completed with the patient and pertinent findings as noted above.  The patient is a 77 year old female.   Allergies Malachi Bonds, CMA; 07/06/2018 2:04 PM) Ambien *HYPNOTICS/SEDATIVES/SLEEP DISORDER AGENTS*  Macrobid *URINARY ANTI-INFECTIVES*  Codeine Phosphate *ANALGESICS - OPIOID*  Fioricet *ANALGESICS - NonNarcotic*   Medication History  (Chemira Jones, CMA; 07/06/2018 2:04 PM) BuPROPion HCl ER (XL) (300MG  Tablet ER 24HR, Oral) Active. Meclizine HCl (25MG  Tablet, Oral) Active. Donepezil HCl (5MG  Tablet, Oral) Active. BuPROPion HCl ER (XL) (150MG  Tablet ER 24HR, Oral) Active. LORazepam (0.5MG  Tablet, Oral) Active. LORazepam (1MG  Tablet, Oral) Active. DilTIAZem HCl ER Coated Beads (180MG  Capsule ER 24HR, Oral) Active. Hydrocortisone Acetate (25MG  Suppository, Rectal) Active. LamoTRIgine (25MG  Tablet, Oral) Active. Latanoprost (0.005% Solution, Ophthalmic) Active. Memantine HCl (5MG  Tablet, Oral) Active. Monurol (3GM Packet, Oral) Active. Pradaxa (150MG  Capsule, Oral) Active. Pravastatin Sodium (40MG  Tablet, Oral) Active. PredniSONE (20MG  Tablet, Oral) Active. Prempro (0.625-2.5MG  Tablet, Oral) Active. Proctozone-HC (2.5% Cream, Rectal) Active. Sertraline HCl (50MG  Tablet, Oral) Active. Shingrix (50MCG For Suspension, Intramuscular) Active. Triamcinolone Acetonide (0.1% Cream, External) Active. Medications Reconciled    Review of Systems Harrell Gave M. Analeise Mccleery MD; 07/06/2018 2:22 PM) General Not Present- Chills and Fever. Skin Not Present- Bruising and Jaundice. HEENT Not Present- Head Injury and Headache. Neck Not Present- Neck Mass and Neck Pain. Respiratory Not Present- Chronic Cough and Decreased Exercise Tolerance. Cardiovascular Not Present- Chest Pain, Difficulty Breathing Lying Down and Shortness of Breath. Gastrointestinal Present- Constipation. Not Present- Abdominal Pain, Bloody Stool, Hemorrhoids, Rectal Pain and Vomiting. Musculoskeletal Present- Joint Pain. Not Present- Joint Redness. Neurological Not Present- Difficulty Speaking and Dizziness. Psychiatric Present- Depression. Not Present- Anxiety. Endocrine Not Present- Excessive Sweating and Excessive Thirst. Hematology Not Present- Abnormal Bleeding and Blood Clots.  Vitals (Chemira Jones CMA; 07/06/2018 2:04 PM) 07/06/2018 2:03  PM Weight: 160 lb Height: 65in Body Surface Area: 1.8 m Body Mass Index: 26.63 kg/m  Pulse: 103 (Regular)  BP: 130/70 (Sitting, Left Arm, Standard)       Physical  Exam Harrell Gave M. Delwyn Scoggin MD; 07/06/2018 2:26 PM) The physical exam findings are as follows: Note:Constitutional: No acute distress; conversant; no deformities Eyes: Moist conjunctiva; no lid lag; anicteric sclerae; pupils equal round and reactive to light Neck: Trachea midline; no palpable thyromegaly Lungs: Normal respiratory effort; no tactile fremitus CV: Regular rate and rhythm; no palpable thrill; no pitting edema GI: Abdomen soft, nontender, nondistended; no palpable hepatosplenomegaly. No palpable inguinal adenopathy Anorectal: Right posterior-anal margin-ulcerated area of nodular skin. No surrounding erythema. Anoscopy: Small internal hemorrhoids; no concerning findings inside the anal canal per se. No ulcerations noted today MSK: Normal gait; no clubbing/cyanosis Psychiatric: Appropriate affect; alert and oriented 3 Lymphatic: No palpable cervical or axillary lymphadenopathy **A chaperone, Chemira Jones, was present for the entire physical exam    Assessment & Plan Harrell Gave M. 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 Now with findings concerning for recurrence in the anal margin Impression: -Given findings today, I recommended anorectal exam under anesthesia, possible wide local excision of anal margin lesion, possible biopsy. We discussed the findings intraoperatively would dictate the ultimate approach. If it appears involve the sphincter muscle, we plan for biopsies as opposed to a complete wide local excision. We  discussed in that setting the potential need for chemoradiation for definitive management -The anatomy and physiology of the anal canal was discussed at length with the patient. The pathophysiology of anal lesions and cancer was discussed at length today -The planned procedure, material risks (including, but not limited to, pain, bleeding, infection, scarring, need for blood transfusion, damage to anal sphincter, incontinence of gas and/or stool, need for additional procedures, recurrence, pneumonia, heart attack, stroke, death) benefits and alternatives to surgery were discussed at length. The patient's questions were answered to her satisfaction, she voiced understanding and elected to proceed with surgery. Additionally, we discussed typical postoperative expectations and the recovery process. Current Plans ANOSCOPY, DIAGNOSTIC [phone]) (Anorectal: Right posterior-anal margin-ulcerated area of nodular skin. No surrounding erythema.; Anoscopy: Small internal hemorrhoids; no concerning findings inside the anal canal per se. No ulcerations noted today)  Signed electronically by Ileana Roup, MD (07/06/2018 2:27 PM)

## 2018-07-17 ENCOUNTER — Encounter: Payer: Self-pay | Admitting: Neurology

## 2018-07-20 ENCOUNTER — Encounter (HOSPITAL_COMMUNITY)
Admission: RE | Admit: 2018-07-20 | Discharge: 2018-07-20 | Disposition: A | Discharge status: Home / Self Care | Payer: Medicare Other | Source: Ambulatory Visit | Attending: Surgery | Admitting: Surgery

## 2018-07-20 ENCOUNTER — Other Ambulatory Visit: Payer: Self-pay

## 2018-07-20 ENCOUNTER — Encounter (HOSPITAL_COMMUNITY): Payer: Self-pay

## 2018-07-20 DIAGNOSIS — F419 Anxiety disorder, unspecified: Secondary | ICD-10-CM | POA: Diagnosis not present

## 2018-07-20 DIAGNOSIS — Z79899 Other long term (current) drug therapy: Secondary | ICD-10-CM | POA: Diagnosis not present

## 2018-07-20 DIAGNOSIS — F0390 Unspecified dementia without behavioral disturbance: Secondary | ICD-10-CM | POA: Diagnosis not present

## 2018-07-20 DIAGNOSIS — Z85048 Personal history of other malignant neoplasm of rectum, rectosigmoid junction, and anus: Secondary | ICD-10-CM | POA: Diagnosis not present

## 2018-07-20 DIAGNOSIS — Z7952 Long term (current) use of systemic steroids: Secondary | ICD-10-CM | POA: Diagnosis not present

## 2018-07-20 DIAGNOSIS — I10 Essential (primary) hypertension: Secondary | ICD-10-CM | POA: Diagnosis not present

## 2018-07-20 DIAGNOSIS — D013 Carcinoma in situ of anus and anal canal: Secondary | ICD-10-CM | POA: Diagnosis not present

## 2018-07-20 DIAGNOSIS — Z01818 Encounter for other preprocedural examination: Secondary | ICD-10-CM

## 2018-07-20 DIAGNOSIS — K648 Other hemorrhoids: Secondary | ICD-10-CM | POA: Diagnosis not present

## 2018-07-20 DIAGNOSIS — I4891 Unspecified atrial fibrillation: Secondary | ICD-10-CM | POA: Diagnosis not present

## 2018-07-20 DIAGNOSIS — M1990 Unspecified osteoarthritis, unspecified site: Secondary | ICD-10-CM | POA: Diagnosis not present

## 2018-07-20 DIAGNOSIS — C44520 Squamous cell carcinoma of anal skin: Secondary | ICD-10-CM | POA: Diagnosis not present

## 2018-07-20 DIAGNOSIS — K629 Disease of anus and rectum, unspecified: Secondary | ICD-10-CM | POA: Diagnosis present

## 2018-07-20 DIAGNOSIS — F329 Major depressive disorder, single episode, unspecified: Secondary | ICD-10-CM | POA: Diagnosis not present

## 2018-07-20 LAB — COMPREHENSIVE METABOLIC PANEL
ALBUMIN: 3.8 g/dL (ref 3.5–5.0)
ALK PHOS: 56 U/L (ref 38–126)
ALT: 17 U/L (ref 0–44)
ANION GAP: 7 (ref 5–15)
AST: 22 U/L (ref 15–41)
BUN: 12 mg/dL (ref 8–23)
CALCIUM: 9.3 mg/dL (ref 8.9–10.3)
CO2: 24 mmol/L (ref 22–32)
CREATININE: 1.05 mg/dL — AB (ref 0.44–1.00)
Chloride: 108 mmol/L (ref 98–111)
GFR calc Af Amer: 57 mL/min — ABNORMAL LOW (ref >60)
GFR calc non Af Amer: 49 mL/min — ABNORMAL LOW (ref >60)
GLUCOSE: 134 mg/dL — AB (ref 70–99)
Potassium: 3.8 mmol/L (ref 3.5–5.1)
SODIUM: 139 mmol/L (ref 135–145)
Total Bilirubin: 0.3 mg/dL (ref 0.3–1.2)
Total Protein: 6.6 g/dL (ref 6.5–8.1)

## 2018-07-20 LAB — CBC WITH DIFFERENTIAL/PLATELET
BASOS ABS: 0 10*3/uL (ref 0.0–0.1)
BASOS PCT: 0 %
Eosinophils Absolute: 0 10*3/uL (ref 0.0–0.5)
Eosinophils Relative: 0 %
HCT: 46.8 % — ABNORMAL HIGH (ref 36.0–46.0)
HEMOGLOBIN: 14.8 g/dL (ref 12.0–15.0)
Lymphocytes Relative: 81 %
Lymphs Abs: 22.5 10*3/uL — ABNORMAL HIGH (ref 0.7–4.0)
MCH: 32.2 pg (ref 26.0–34.0)
MCHC: 31.6 g/dL (ref 30.0–36.0)
MCV: 102 fL — ABNORMAL HIGH (ref 80.0–100.0)
Monocytes Absolute: 0 10*3/uL — ABNORMAL LOW (ref 0.1–1.0)
Monocytes Relative: 0 %
NEUTROS ABS: 5.3 10*3/uL (ref 1.7–7.7)
NEUTROS PCT: 19 %
NRBC: 0 /100{WBCs}
Platelets: 220 10*3/uL (ref 150–400)
RBC: 4.59 MIL/uL (ref 3.87–5.11)
RDW: 14.2 % (ref 11.5–15.5)
WBC: 27.8 10*3/uL — ABNORMAL HIGH (ref 4.0–10.5)
nRBC: 0 % (ref 0.0–0.2)

## 2018-07-20 LAB — APTT: APTT: 25 s (ref 24–36)

## 2018-07-20 LAB — PROTIME-INR
INR: 1.04
Prothrombin Time: 13.6 seconds (ref 11.4–15.2)

## 2018-07-20 NOTE — Progress Notes (Signed)
PCP - Dr Joylene Draft Cardiologist- Dr Burt Knack  Chest x-ray - 09/01/17 EKG - 07/20/2018  ECHO - 04/17/16  Pt is adamant that she does not want oxycodone or hydrocodone after surgery. Pt encouraged to talk to Dr. Dema Severin about this.   Anesthesia review:   Patient denies shortness of breath, fever, cough and chest pain at PAT appointment   Patient verbalized understanding of instructions that were given to them at the PAT appointment. Patient was also instructed that they will need to review over the PAT instructions again at home before surgery.

## 2018-07-20 NOTE — Pre-Procedure Instructions (Signed)
Bethany Oconnor  07/20/2018      [facility] - [place], [place] - [address] [place] [address] Phone: [phone] Fax: [phone]  [facility] [place], [place] - [address] [place] [address] Phone: [phone] Fax: [phone]    Your procedure is scheduled on Wednesday November 20.  Report to [facility] Admitting at 6;30 A.M.  Call this number if you have problems the morning of surgery:  [phone]   Remember:  Do not eat or drink after midnight.    Take these medicines the morning of surgery with A SIP OF WATER:   Bupropion (Wellbutrin) Diltiazem (Cardizem) Gabapentin (neurontin) Lamotrigine (Lamictal) Myrbegron (Myrbetriq) Omeprazole (prilosec) Trimethoprim (Trimpex) Eye drops Ativan if needed Tylenol if needed Meclizine if needed  7 days prior to surgery STOP taking any Aspirin(unless otherwise instructed by your surgeon), Aleve, Naproxen, Ibuprofen, Motrin, Advil, Goody's, BC's, all herbal medications, fish oil, and all vitamins     Do not wear jewelry, make-up or nail polish.  Do not wear lotions, powders, or perfumes, or deodorant.  Do not shave 48 hours prior to surgery.  Men may shave face and neck.  Do not bring valuables to the hospital.  [facility] is not responsible for any belongings or valuables.  Contacts, dentures or bridgework may not be worn into surgery.  Leave your suitcase in the car.  After surgery it may be brought to your room.  For patients admitted to the hospital, discharge time will be determined by your treatment team.  Patients discharged the day of surgery will not be allowed to drive home.    Special instructions:    [facility]- Preparing For Surgery  Before surgery, you can play an important role. Because skin is not sterile, your skin needs to be as free of germs as possible. You can reduce the  number of germs on your skin by washing with CHG (chlorahexidine gluconate) Soap before surgery.  CHG is an antiseptic cleaner which kills germs and bonds with the skin to continue killing germs even after washing.    Oral Hygiene is also important to reduce your risk of infection.  Remember - BRUSH YOUR TEETH THE MORNING OF SURGERY WITH YOUR REGULAR TOOTHPASTE  Please do not use if you have an allergy to CHG or antibacterial soaps. If your skin becomes reddened/irritated stop using the CHG.  Do not shave (including legs and underarms) for at least 48 hours prior to first CHG shower. It is OK to shave your face.  Please follow these instructions carefully.   1. Shower the NIGHT BEFORE SURGERY and the MORNING OF SURGERY with CHG.   2. If you chose to wash your hair, wash your hair first as usual with your normal shampoo.  3. After you shampoo, rinse your hair and body thoroughly to remove the shampoo.  4. Use CHG as you would any other liquid soap. You can apply CHG directly to the skin and wash gently with a scrungie or a clean washcloth.   5. Apply the CHG Soap to your body ONLY FROM THE NECK DOWN.  Do not use on open wounds or open sores. Avoid contact with your eyes, ears, mouth and genitals (private parts). Wash Face and genitals (private parts)  with your normal soap.  6. Wash thoroughly, paying special attention to the area where your surgery will be performed.  7.  Thoroughly rinse your body with warm water from the neck down.  8. DO NOT shower/wash with your normal soap after using and rinsing off the CHG Soap.  9. Pat yourself dry with a CLEAN TOWEL.  10. Wear CLEAN PAJAMAS to bed the night before surgery, wear comfortable clothes the morning of surgery  11. Place CLEAN SHEETS on your bed the night of your first shower and DO NOT SLEEP WITH PETS.    Day of Surgery:  Do not apply any deodorants/lotions.  Please wear clean clothes to the hospital/surgery center.   Remember  to brush your teeth WITH YOUR REGULAR TOOTHPASTE.    Please read over the following fact sheets that you were given. Coughing and Deep Breathing and Surgical Site Infection Prevention

## 2018-07-21 MED ORDER — BUPIVACAINE LIPOSOME 1.3 % IJ SUSP
20.0000 mL | INTRAMUSCULAR | Status: AC
  Administered 2018-07-22: 20 mL
  Filled 2018-07-21: qty 20

## 2018-07-21 NOTE — Anesthesia Preprocedure Evaluation (Addendum)
Anesthesia Evaluation  Patient identified by MRN, date of birth, ID band Patient awake    Reviewed: Allergy & Precautions, H&P , NPO status , Patient's Chart, lab work & pertinent test results  History of Anesthesia Complications (+) PONV  Airway Mallampati: III  TM Distance: >3 FB Neck ROM: Full    Dental no notable dental hx. (+) Teeth Intact, Dental Advisory Given   Pulmonary neg pulmonary ROS, former smoker,    Pulmonary exam normal breath sounds clear to auscultation       Cardiovascular Exercise Tolerance: Good hypertension, On Medications + dysrhythmias Atrial Fibrillation  Rhythm:Regular Rate:Normal     Neuro/Psych Anxiety Depression Dementia negative neurological ROS     GI/Hepatic negative GI ROS, Neg liver ROS,   Endo/Other  negative endocrine ROS  Renal/GU negative Renal ROS  negative genitourinary   Musculoskeletal  (+) Arthritis , Osteoarthritis,    Abdominal   Peds  Hematology  (+) Blood dyscrasia, , CLL   Anesthesia Other Findings   Reproductive/Obstetrics negative OB ROS                          Anesthesia Physical Anesthesia Plan  ASA: II  Anesthesia Plan: General   Post-op Pain Management:    Induction: Intravenous  PONV Risk Score and Plan: 4 or greater and Ondansetron, Dexamethasone and Treatment may vary due to age or medical condition  Airway Management Planned: Oral ETT  Additional Equipment:   Intra-op Plan:   Post-operative Plan: Extubation in OR  Informed Consent: I have reviewed the patients History and Physical, chart, labs and discussed the procedure including the risks, benefits and alternatives for the proposed anesthesia with the patient or authorized representative who has indicated his/her understanding and acceptance.   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 Pt had lumbar decompression 04/2018. She was cleared by cardiology at that time stating "Bethany Oconnor was last seen on 02/16/18 by Dr. Burt Knack. H/o PAF, HTN, small ASD, cerebrovascular aneurysm s/p coiling, MD, depression. No recent recurrence of atrial fib. Pradaxa was discontinued given recurrent back problems, intermittent need for procedures such as spinal injections, use of nonsteroidal anti-inflammatories, and increasing fall risk in the setting of deteriorating vision and gait instability. Echo 2017 EF 50-55%. No prior hx of CAD noted. RCRI 0.4% indicating low risk of cardiac complications. Since that day, Bethany Oconnor has not had any new cardiac symptoms. Activity is very limited by her back pain but she can walk through her home, wash dishes and go up a hill slowly if needed without angina, dyspnea, syncope or palpitations. Therefore, based on ACC/AHA guidelines, the patient would be at acceptable risk for the planned procedure without further cardiovascular testing.")       Anesthesia Quick Evaluation

## 2018-07-22 ENCOUNTER — Encounter (HOSPITAL_COMMUNITY)
Admission: RE | Disposition: A | Discharge status: Home / Self Care | Payer: Self-pay | Source: Ambulatory Visit | Attending: Surgery

## 2018-07-22 ENCOUNTER — Ambulatory Visit (HOSPITAL_COMMUNITY)
Admission: RE | Admit: 2018-07-22 | Discharge: 2018-07-22 | Disposition: A | Discharge status: Home / Self Care | Payer: Medicare Other | Source: Ambulatory Visit | Attending: Surgery | Admitting: Surgery

## 2018-07-22 ENCOUNTER — Ambulatory Visit (HOSPITAL_COMMUNITY): Payer: Medicare Other | Admitting: Anesthesiology

## 2018-07-22 ENCOUNTER — Ambulatory Visit (HOSPITAL_COMMUNITY): Payer: Medicare Other | Admitting: Physician Assistant

## 2018-07-22 ENCOUNTER — Encounter (HOSPITAL_COMMUNITY): Payer: Self-pay | Admitting: *Deleted

## 2018-07-22 DIAGNOSIS — M1990 Unspecified osteoarthritis, unspecified site: Secondary | ICD-10-CM | POA: Insufficient documentation

## 2018-07-22 DIAGNOSIS — I4891 Unspecified atrial fibrillation: Secondary | ICD-10-CM | POA: Insufficient documentation

## 2018-07-22 DIAGNOSIS — D013 Carcinoma in situ of anus and anal canal: Secondary | ICD-10-CM | POA: Diagnosis not present

## 2018-07-22 DIAGNOSIS — K648 Other hemorrhoids: Secondary | ICD-10-CM | POA: Insufficient documentation

## 2018-07-22 DIAGNOSIS — F419 Anxiety disorder, unspecified: Secondary | ICD-10-CM | POA: Insufficient documentation

## 2018-07-22 DIAGNOSIS — C44520 Squamous cell carcinoma of anal skin: Secondary | ICD-10-CM | POA: Diagnosis not present

## 2018-07-22 DIAGNOSIS — Z79899 Other long term (current) drug therapy: Secondary | ICD-10-CM | POA: Insufficient documentation

## 2018-07-22 DIAGNOSIS — F0390 Unspecified dementia without behavioral disturbance: Secondary | ICD-10-CM | POA: Insufficient documentation

## 2018-07-22 DIAGNOSIS — I10 Essential (primary) hypertension: Secondary | ICD-10-CM | POA: Insufficient documentation

## 2018-07-22 DIAGNOSIS — F329 Major depressive disorder, single episode, unspecified: Secondary | ICD-10-CM | POA: Insufficient documentation

## 2018-07-22 DIAGNOSIS — Z7952 Long term (current) use of systemic steroids: Secondary | ICD-10-CM | POA: Insufficient documentation

## 2018-07-22 DIAGNOSIS — Z85048 Personal history of other malignant neoplasm of rectum, rectosigmoid junction, and anus: Secondary | ICD-10-CM | POA: Insufficient documentation

## 2018-07-22 HISTORY — PX: RECTAL EXAM UNDER ANESTHESIA: SHX6399

## 2018-07-22 HISTORY — PX: LESION REMOVAL: SHX5196

## 2018-07-22 SURGERY — EXAM UNDER ANESTHESIA, RECTUM
Anesthesia: General

## 2018-07-22 MED ORDER — ONDANSETRON HCL 4 MG/2ML IJ SOLN
INTRAMUSCULAR | Status: DC | PRN
  Administered 2018-07-22: 4 mg via INTRAVENOUS

## 2018-07-22 MED ORDER — TRAMADOL HCL 50 MG PO TABS: 50.0000 mg | ORAL_TABLET | Freq: Four times a day (QID) | ORAL | 0 refills | Status: AC | PRN

## 2018-07-22 MED ORDER — FENTANYL CITRATE (PF) 250 MCG/5ML IJ SOLN
INTRAMUSCULAR | Status: AC
  Filled 2018-07-22: qty 5

## 2018-07-22 MED ORDER — CHLORHEXIDINE GLUCONATE CLOTH 2 % EX PADS: 6.0000 | MEDICATED_PAD | Freq: Once | CUTANEOUS | Status: DC

## 2018-07-22 MED ORDER — GABAPENTIN 300 MG PO CAPS: 300.0000 mg | ORAL_CAPSULE | ORAL | Status: DC

## 2018-07-22 MED ORDER — LIDOCAINE 2% (20 MG/ML) 5 ML SYRINGE
INTRAMUSCULAR | Status: DC | PRN
  Administered 2018-07-22: 60 mg via INTRAVENOUS

## 2018-07-22 MED ORDER — SODIUM CHLORIDE 0.9% FLUSH
INTRAVENOUS | Status: DC | PRN
  Administered 2018-07-22: 10 mL

## 2018-07-22 MED ORDER — FENTANYL CITRATE (PF) 100 MCG/2ML IJ SOLN
INTRAMUSCULAR | Status: AC
  Filled 2018-07-22: qty 2

## 2018-07-22 MED ORDER — ACETAMINOPHEN 500 MG PO TABS
ORAL_TABLET | ORAL | Status: AC
  Filled 2018-07-22: qty 2

## 2018-07-22 MED ORDER — LACTATED RINGERS IV SOLN
INTRAVENOUS | Status: DC | PRN
  Administered 2018-07-22: 08:00:00 via INTRAVENOUS

## 2018-07-22 MED ORDER — ONDANSETRON HCL 4 MG/2ML IJ SOLN
INTRAMUSCULAR | Status: AC
  Filled 2018-07-22: qty 2

## 2018-07-22 MED ORDER — DEXAMETHASONE SODIUM PHOSPHATE 10 MG/ML IJ SOLN
INTRAMUSCULAR | Status: DC | PRN
  Administered 2018-07-22: 10 mg via INTRAVENOUS

## 2018-07-22 MED ORDER — GABAPENTIN 300 MG PO CAPS
ORAL_CAPSULE | ORAL | Status: AC
  Filled 2018-07-22: qty 1

## 2018-07-22 MED ORDER — DEXAMETHASONE SODIUM PHOSPHATE 10 MG/ML IJ SOLN
INTRAMUSCULAR | Status: AC
  Filled 2018-07-22: qty 1

## 2018-07-22 MED ORDER — EPHEDRINE 5 MG/ML INJ
INTRAVENOUS | Status: AC
  Filled 2018-07-22: qty 10

## 2018-07-22 MED ORDER — ROCURONIUM BROMIDE 50 MG/5ML IV SOSY
PREFILLED_SYRINGE | INTRAVENOUS | Status: AC
  Filled 2018-07-22: qty 5

## 2018-07-22 MED ORDER — ROCURONIUM BROMIDE 10 MG/ML (PF) SYRINGE
PREFILLED_SYRINGE | INTRAVENOUS | Status: DC | PRN
  Administered 2018-07-22: 40 mg via INTRAVENOUS

## 2018-07-22 MED ORDER — PROPOFOL 10 MG/ML IV BOLUS
INTRAVENOUS | Status: AC
  Filled 2018-07-22: qty 20

## 2018-07-22 MED ORDER — ACETAMINOPHEN 500 MG PO TABS
1000.0000 mg | ORAL_TABLET | ORAL | Status: AC
  Administered 2018-07-22: 500 mg via ORAL

## 2018-07-22 MED ORDER — SUGAMMADEX SODIUM 200 MG/2ML IV SOLN
INTRAVENOUS | Status: DC | PRN
  Administered 2018-07-22: 150 mg via INTRAVENOUS

## 2018-07-22 MED ORDER — LIDOCAINE 2% (20 MG/ML) 5 ML SYRINGE
INTRAMUSCULAR | Status: AC
  Filled 2018-07-22: qty 5

## 2018-07-22 MED ORDER — FENTANYL CITRATE (PF) 100 MCG/2ML IJ SOLN
25.0000 ug | INTRAMUSCULAR | Status: DC | PRN
  Administered 2018-07-22: 25 ug via INTRAVENOUS

## 2018-07-22 MED ORDER — PHENYLEPHRINE 40 MCG/ML (10ML) SYRINGE FOR IV PUSH (FOR BLOOD PRESSURE SUPPORT)
PREFILLED_SYRINGE | INTRAVENOUS | Status: AC
  Filled 2018-07-22: qty 10

## 2018-07-22 MED ORDER — 0.9 % SODIUM CHLORIDE (POUR BTL) OPTIME
TOPICAL | Status: DC | PRN
  Administered 2018-07-22: 1000 mL

## 2018-07-22 MED ORDER — FENTANYL CITRATE (PF) 250 MCG/5ML IJ SOLN
INTRAMUSCULAR | Status: DC | PRN
  Administered 2018-07-22: 50 ug via INTRAVENOUS
  Administered 2018-07-22: 25 ug via INTRAVENOUS

## 2018-07-22 MED ORDER — PROPOFOL 10 MG/ML IV BOLUS
INTRAVENOUS | Status: DC | PRN
  Administered 2018-07-22: 70 mg via INTRAVENOUS
  Administered 2018-07-22: 20 mg via INTRAVENOUS

## 2018-07-22 MED ORDER — HEMOSTATIC AGENTS (NO CHARGE) OPTIME
TOPICAL | Status: DC | PRN
  Administered 2018-07-22: 1 via TOPICAL

## 2018-07-22 SURGICAL SUPPLY — 36 items
BLADE SURG 15 STRL LF DISP TIS (BLADE) ×1 IMPLANT
BLADE SURG 15 STRL SS (BLADE) ×2
BRIEF STRETCH FOR OB PAD LRG (UNDERPADS AND DIAPERS) ×3 IMPLANT
CANISTER SUCT 3000ML PPV (MISCELLANEOUS) IMPLANT
COVER SURGICAL LIGHT HANDLE (MISCELLANEOUS) ×3 IMPLANT
COVER WAND RF STERILE (DRAPES) ×3 IMPLANT
DRSG PAD ABDOMINAL 8X10 ST (GAUZE/BANDAGES/DRESSINGS) ×3 IMPLANT
ELECT CAUTERY BLADE 6.4 (BLADE) ×3 IMPLANT
GAUZE SPONGE 4X4 12PLY STRL (GAUZE/BANDAGES/DRESSINGS) ×3 IMPLANT
GLOVE BIO SURGEON STRL SZ7.5 (GLOVE) ×3 IMPLANT
GLOVE INDICATOR 8.0 STRL GRN (GLOVE) ×3 IMPLANT
GOWN STRL REUS W/ TWL LRG LVL3 (GOWN DISPOSABLE) ×1 IMPLANT
GOWN STRL REUS W/ TWL XL LVL3 (GOWN DISPOSABLE) ×2 IMPLANT
GOWN STRL REUS W/TWL LRG LVL3 (GOWN DISPOSABLE) ×2
GOWN STRL REUS W/TWL XL LVL3 (GOWN DISPOSABLE) ×4
KIT BASIN OR (CUSTOM PROCEDURE TRAY) ×3 IMPLANT
KIT TURNOVER KIT B (KITS) ×3 IMPLANT
NEEDLE HYPO 25GX1X1/2 BEV (NEEDLE) ×3 IMPLANT
NS IRRIG 1000ML POUR BTL (IV SOLUTION) ×3 IMPLANT
PACK GENERAL/GYN (CUSTOM PROCEDURE TRAY) ×3 IMPLANT
PACK LITHOTOMY IV (CUSTOM PROCEDURE TRAY) IMPLANT
PAD ARMBOARD 7.5X6 YLW CONV (MISCELLANEOUS) ×6 IMPLANT
PENCIL BUTTON HOLSTER BLD 10FT (ELECTRODE) ×3 IMPLANT
PENCIL SMOKE EVACUATOR (MISCELLANEOUS) ×3 IMPLANT
SPONGE HEMORRHOID 8X3CM (HEMOSTASIS) ×3 IMPLANT
SPONGE LAP 4X18 RFD (DISPOSABLE) ×3 IMPLANT
SURGILUBE 2OZ TUBE FLIPTOP (MISCELLANEOUS) ×3 IMPLANT
SUT CHROMIC 3 0 SH 27 (SUTURE) ×3 IMPLANT
SUT VIC AB 3-0 SH 27 (SUTURE) ×4
SUT VIC AB 3-0 SH 27X BRD (SUTURE) ×2 IMPLANT
SYR CONTROL 10ML LL (SYRINGE) ×3 IMPLANT
TOWEL GREEN STERILE (TOWEL DISPOSABLE) ×3 IMPLANT
TOWEL OR 17X26 10 PK STRL BLUE (TOWEL DISPOSABLE) IMPLANT
TUBE CONNECTING 12'X1/4 (SUCTIONS) ×1
TUBE CONNECTING 12X1/4 (SUCTIONS) ×2 IMPLANT
YANKAUER SUCT BULB TIP NO VENT (SUCTIONS) ×3 IMPLANT

## 2018-07-22 NOTE — Transfer of Care (Signed)
Immediate Anesthesia Transfer of Care Note  Patient: Bethany Oconnor  Procedure(s) Performed: ANORECTAL EXAM UNDER ANESTHESIA ERAS PATHWAY (N/A ) EXCISION OF PERIANAL LESION (N/A )  Patient Location: PACU  Anesthesia Type:General  Level of Consciousness: drowsy, patient cooperative and responds to stimulation  Airway & Oxygen Therapy: Patient Spontanous Breathing and Patient connected to nasal cannula oxygen  Post-op Assessment: Report given to RN and Post -op Vital signs reviewed and stable  Post vital signs: Reviewed and stable  Last Vitals:  Vitals Value Taken Time  BP 134/64 07/22/2018  9:41 AM  Temp    Pulse 71 07/22/2018  9:42 AM  Resp 12 07/22/2018  9:42 AM  SpO2 95 % 07/22/2018  9:42 AM  Vitals shown include unvalidated device data.  Last Pain:  Vitals:   07/22/18 0658  TempSrc: Oral     Report to Lattie Haw RN    Complications: No apparent anesthesia complications

## 2018-07-22 NOTE — Anesthesia Procedure Notes (Signed)
Procedure Name: Intubation Date/Time: 07/22/2018 8:30 AM Performed by: Jearld Pies, CRNA Pre-anesthesia Checklist: Patient identified, Emergency Drugs available, Suction available and Patient being monitored Patient Re-evaluated:Patient Re-evaluated prior to induction Oxygen Delivery Method: Circle System Utilized Preoxygenation: Pre-oxygenation with 100% oxygen Induction Type: IV induction Ventilation: Mask ventilation without difficulty Laryngoscope Size: Mac and 3 Grade View: Grade I Tube type: Oral Tube size: 7.0 mm Number of attempts: 1 Airway Equipment and Method: Stylet and Oral airway Placement Confirmation: ETT inserted through vocal cords under direct vision,  positive ETCO2 and breath sounds checked- equal and bilateral Secured at: 21 cm Tube secured with: Tape Dental Injury: Teeth and Oropharynx as per pre-operative assessment

## 2018-07-22 NOTE — H&P (Addendum)
H&P UPDATE  H&P from 07/14/18 was reviewed by myself with the patient. She reports no interval changes in her health or history. She is ready for the procedure today.  Vitals:   07/22/18 0658  BP: 135/70  Pulse: 69  Resp: 18  Temp: 97.6 F (36.4 C)  TempSrc: Oral  SpO2: 97%  Weight: 70.3 kg  Height: 5\' 5"  (1.651 m)   NAD, comfortable RRR Abdomen soft, Nt/ND  Ms. Kydd is a very pleasant 43yoF here for surgery. 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 Now with findings concerning for recurrence in the anal margin  -She states she has been off all blood thinners including pradaxa since August of this year at time of her spine surgery -Will plan anorectal exam under anesthesia, possible wide local excision of anal margin lesion, possible biopsy. We discussed the findings intraoperatively would dictate the ultimate approach. If it appears involve the sphincter muscle, we plan for biopsies as opposed to a complete wide local excision. We discussed in that setting the potential need for chemoradiation for definitive management -The anatomy and physiology of the anal canal was discussed at length with the patient. The pathophysiology of anal lesions and cancer was discussed at length today -The planned procedure, material risks (including, but not limited to, pain, bleeding, infection, scarring, need for blood transfusion, damage to anal sphincter, incontinence of gas and/or stool, need for additional procedures, recurrence, pneumonia, heart attack, stroke, death) benefits and alternatives to surgery were discussed at length. The patient's questions were answered to her satisfaction, she voiced understanding and elected to proceed with surgery. Additionally, we discussed typical postoperative expectations and the recovery  process.  Sharon Mt. Dema Severin, M.D. General and Colorectal Surgery [facility]

## 2018-07-22 NOTE — Discharge Instructions (Addendum)
ANORECTAL SURGERY: POST OP INSTRUCTIONS  The lesion that we found in the office was removed in the operating room today and the wound closed with dissolvable sutures.  1. DIET: Follow a light bland diet the first 24 hours after arrival home, such as soup, liquids, crackers, etc.  Be sure to include lots of fluids daily.  Avoid fast food or heavy meals as your are more likely to get nauseated.  Eat a low fat diet the next few days after surgery.    2. Take your usually prescribed home medications unless otherwise directed.  3. PAIN CONTROL: a. It is helpful to take an over-the-counter pain medication regularly for the first few days/weeks.  Choose from the following that works best for you: i. Ibuprofen (Advil, etc) Three 200mg  tabs every 6 hours as needed. ii. Acetaminophen (Tylenol, etc) 500-650mg  every 6 hours as needed iii. NOTE: You may take both of these medications together - most patients find it most helpful when alternating between the two (i.e. Ibuprofen at 6am, tylenol at 9am, ibuprofen at 12pm ...) b. A  prescription for pain medication may have been prescribed for you at discharge - Tramadol.  Take your pain medication as prescribed.  i. If you are having problems/concerns with the prescription medicine, please call us for further advice.  4. Avoid getting constipated.  Between the surgery and the pain medications, it is common to experience some constipation.  Increasing fluid intake (64oz of water per day) and taking a fiber supplement (such as Metamucil, Citrucel, FiberCon) 1-2 times a day regularly will usually help prevent this problem from occurring.  Take Miralax (over the counter) 1-2x/day while taking a narcotic pain medication. If no bowel movement after 48hours, you may additionally take a laxative like a bottle of Milk of Magnesia which can be purchased over the counter. Avoid enemas if possible as these are often painful.   5. Watch out for diarrhea.  If you have many  loose bowel movements, simplify your diet to bland foods.  Stop any stool softeners and decrease your fiber supplement. If this worsens or does not improve, please call us.  6. Wash / shower every day.  If you were discharged with a dressing, you may remove this the day after your surgery (or sooner if you have a bowel movement). A piece of foam was intentionally placed and left in the rectum and will pass with your first bowel movement - it may look like a tampon. Do no be alarmed. You may shower normally, getting soap/water on your wound, particularly after bowel movements.  7. Soaking in a warm bath filled a couple inches ("Sitz bath") is a great way to clean the area after a bowel movement and many patients find it is a way to soothe the area.  8. ACTIVITIES as tolerated:   a. You may resume regular (light) daily activities beginning the next day--such as daily self-care, walking, climbing stairs--gradually increasing activities as tolerated.  If you can walk 30 minutes without difficulty, it is safe to try more intense activity such as jogging, treadmill, bicycling, low-impact aerobics, etc. b. Refrain from any heavy lifting or straining for the first 2 weeks after your procedure, particularly if your surgery was for hemorrhoids. c. Avoid activities that make your pain worse d. You may drive when you are no longer taking prescription pain medication, you can comfortably wear a seatbelt, and you can safely maneuver your car and apply brakes.  9. FOLLOW UP in our office  a. Please call [facility] at [phone] to set up an appointment to see your surgeon in the office for a follow-up appointment approximately 2 weeks after your surgery. b. Make sure that you call for this appointment the day you arrive home to insure a convenient appointment time.  9. If you have disability or family leave forms that need to be completed, you may have them completed by your primary care physician's office; for return  to work instructions, please ask our office staff and they will be happy to assist you in obtaining this documentation   When to call us [phone]: 1. Poor pain control 2. Reactions / problems with new medications (rash/itching, etc)  3. Fever over 101.5 F (38.5 C) 4. Inability to urinate 5. Nausea/vomiting 6. Worsening swelling or bruising 7. Continued bleeding from incision. 8. Increased pain, redness, or drainage from the incision  The clinic staff is available to answer your questions during regular business hours (8:30am-5pm).  Please dont hesitate to call and ask to speak to one of our nurses for clinical concerns.   A surgeon from [facility] is always on call at the hospitals   If you have a medical emergency, go to the nearest emergency room or call 911.   [facility], [place], [place], [place],   [address] ? MAIN: [phone] FAX [phone] [email or web address]

## 2018-07-22 NOTE — Anesthesia Postprocedure Evaluation (Signed)
Anesthesia Post Note  Patient: Bethany Oconnor  Procedure(s) Performed: ANORECTAL EXAM UNDER ANESTHESIA ERAS PATHWAY (N/A ) EXCISION OF PERIANAL LESION (N/A )     Patient location during evaluation: PACU Anesthesia Type: General Level of consciousness: awake and alert Pain management: pain level controlled Vital Signs Assessment: post-procedure vital signs reviewed and stable Respiratory status: spontaneous breathing, nonlabored ventilation and respiratory function stable Cardiovascular status: blood pressure returned to baseline and stable Postop Assessment: no apparent nausea or vomiting Anesthetic complications: no    Last Vitals:  Vitals:   07/22/18 1100 07/22/18 1126  BP: 129/64 132/60  Pulse: 64 63  Resp: 20 20  Temp:    SpO2: 93% 94%    Last Pain:  Vitals:   07/22/18 1100  TempSrc:   PainSc: Asleep                 Calley Drenning,W. EDMOND

## 2018-07-22 NOTE — Op Note (Signed)
07/22/2018  9:26 AM  PATIENT:  Laurita Quint  77 y.o. female  Patient Care Team: Crist Infante, MD as PCP - General (Internal Medicine) Sherren Mocha, MD as PCP - Cardiology (Cardiology) Crist Infante, MD (Internal Medicine) Newton Pigg, MD as Consulting Physician (Obstetrics and Gynecology) Truitt Merle, MD as Consulting Physician (Hematology) Leta Baptist, MD as Consulting Physician (Otolaryngology) Sherlynn Stalls, MD as Consulting Physician (Ophthalmology) Regal, Tamala Fothergill, DPM as Consulting Physician (Podiatry) Harriett Sine, MD as Consulting Physician (Dermatology) Erroll Luna, MD as Consulting Physician (General Surgery)  PRE-OPERATIVE DIAGNOSIS:  Anal margin lesion; history of squamous cell carcinoma  POST-OPERATIVE DIAGNOSIS:  Same  PROCEDURE:   1. Wide local excision of anal margin lesion 2. Hemorrhoidectomy 3. Anorectal exam under anesthesia with anoscopy  SURGEON:  Surgeon(s): Ileana Roup, MD  ASSISTANT: OR staff   ANESTHESIA:   general  SPECIMEN:   1. Anal margin lesion - long suture lateral, short suture anterior 2. Additional medial margin - which was an internal hemorrhoid  DISPOSITION OF SPECIMEN:  PATHOLOGY  COUNTS:  YES  PLAN OF CARE: Discharge to home after PACU  PATIENT DISPOSITION:  PACU - hemodynamically stable.  INDICATION: Ms. 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 She refused colonoscopy following. She was followed in the office with serial anoscopy.  She was seen last week and follow-up as well and at that time noted to have a perianal lesion which was concerning for a squamous cell carcinoma and this was actually in the anal margin.  Options were discussed moving forward and she opted to undergo examination under  anesthesia with possible biopsy versus excision based on intraoperative findings.  Please refer to H&P for details regarding this discussion.  OR FINDINGS: Anal margin lesion which measured 2 x 1 cm in size (2 cm was medial to lateral). Wide local excision performed with 15mm circumferential margins. Specimen oriented with with long sutures lateral and short suture marking anterior. The medial most margin did overlie the external most portion of the external sphincter on the right. Additional medial margin was sent which represented an internal hemorrhoid and this was sent separately. This appeared grossly normal but was where the margin would likely be closest if this is the case.  DESCRIPTION: The patient was identified in the preoperative holding area and taken to the OR. SCDs were placed.  General endotracheal anesthesia was induced without difficulty. The patient was then rolled onto the operating table and positioned in prone jackknife position with buttocks gently taped apart. Pressure points were padded and verified.  The patient was then prepped and draped in usual sterile fashion.  A surgical timeout was performed indicating the correct patient, procedure, positioning.  Beginning with a digital rectal exam, there were no palpable abnormalities inside the anal canal or distal rectum.  A Hill-Ferguson retractor was placed and circumferential anoscopy performed. There were no lesions inside the anal canal and all tissue here was smooth. She did have internal hemorrhoids but all overlying mucosa appeared normal. The lesion of concern was identified in the right anal margin.  This was measured 2 x 1 cm in size.  The lesion was then marked circumferentially with 6 mm margins.  A 15 blade was used to incise the skin circumferentially.  Dissection into the subcutaneous tissue then commenced  using electrocautery.  The lesion was fully excised and oriented prior to final transection.  The medial most aspect of  the lesion did overlie the external most portion of the external sphincter.  There was a small internal hemorrhoid at this location which was additionally excised and sent separately - an internal hemorrhoidectomy was performed at this location. The wound was irrigated and hemostasis achieved with electrocautery. The wound was then closed in two layers - 2-0 vicryl deep dermal sutures followed by a running 3-0 chromic suture.  Sponge, needle, and instrument counts were reported correct x2. The anus was widely examined and found to be patent and accommodated a medium-sized Hill-Ferguson retractor without issue.  A piece of Proctofoam was placed in the anal canal.  4 x 4 gauze and an ABD followed by mesh underwear were then placed.  The patient was then awakened from anesthesia, transferred to a stretcher, extubated and transported PACU in satisfactory condition    DISPOSITION: PACU in satisfactory condition.

## 2018-07-23 ENCOUNTER — Encounter (HOSPITAL_COMMUNITY): Payer: Self-pay | Admitting: Surgery

## 2018-08-02 NOTE — Progress Notes (Signed)
[facility]  [phone] [phone]) [phone]  Clinic Follow up Note   Patient Care Team: Crist Infante, MD as PCP - General (Internal Medicine) Sherren Mocha, MD as PCP - Cardiology (Cardiology) Crist Infante, MD (Internal Medicine) Newton Pigg, MD as Consulting Physician (Obstetrics and Gynecology) Truitt Merle, MD as Consulting Physician (Hematology) Leta Baptist, MD as Consulting Physician (Otolaryngology) Sherlynn Stalls, MD as Consulting Physician (Ophthalmology) Paulla Dolly Tamala Fothergill, DPM as Consulting Physician (Podiatry) Harriett Sine, MD as Consulting Physician (Dermatology) Erroll Luna, MD as Consulting Physician (General Surgery) 08/03/2018   Chief Complaint: 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 female who is here for follow-up of CLL.   She noticed a "lump" on her buttock in early September. This lump was evaluated and colonoscopy was performed. Pathology showed high grade squamous cell carcinoma in the anal margin. She was seen by colorectal surgeon Dr. Dema Severin, and underwent surgical resection 07/22/2018.   Today, she is here with her caregiver. She lives in an independent apartment in [place]. She is nervous about her anal cancer and states that her doctor recommended repeat biopsy. She denies pain, but the lesion bled in November. There is spotting with straining.  She is recovering from her back surgery, but still has moderate back pain. She says that she was put on Oxycodone after surgery, and had to stay in rehab for 8 weeks after that. She reports having no appetite. She is losing weight. She feels nauseated when she eats and she gags when she eats.  She has macular degeneration, and states that she's developing glaucoma. Her caregiver states that she sometimes sees a "chest" on the floor that is not there. She is hard of hearing.    Pertinent  positives and negatives of review of systems are listed and detailed within the above HPI.   REVIEW OF SYSTEMS:   Constitutional: Denies fevers, chills or abnormal weight loss Eyes: (+) macular degeneration  Ears, nose, mouth, throat, and face: Denies mucositis or sore throat (+) hard of hearing Respiratory: Denies cough, dyspnea or wheezes Cardiovascular: Denies palpitation, chest discomfort or lower extremity swelling Gastrointestinal:  Denies nausea, heartburn or change in bowel habits (+) rectal bleeding with straining Skin: Denies abnormal skin rashes Lymphatics: Denies new lymphadenopathy or easy bruising Neurological:Denies numbness, tingling or new weaknesses Behavioral/Psych: Mood is stable, no new changes  All other systems were reviewed with the patient and are negative.  MEDICAL HISTORY:  Past Medical History:  Diagnosis Date  . Anxiety   . Arthritis   . Cerebral aneurysm    s/p endovascular colling 2008  . CLL (chronic lymphocytic leukemia) (HCC)    CLL  . Depression   . Hearing loss   . Macular degeneration   . Neuropathy   . Paroxysmal atrial fibrillation (HCC)   . Pneumonia    08-04-17  . PONV (postoperative nausea and vomiting)   . Thyroid disease     SURGICAL HISTORY: Past Surgical History:  Procedure Laterality Date  . ANEURYSM COILING    . CATARACT EXTRACTION     bilateral  . CERVICAL CONE BIOPSY    . COLONOSCOPY    . EVALUATION UNDER ANESTHESIA WITH HEMORRHOIDECTOMY N/A 09/03/2017   Procedure: EXAM UNDER ANESTHESIA, EXCISION OF ANAL LESION, POSSIBLE HEMORRHOIDECTOMY;  Surgeon: Ileana Roup, MD;  Location: [facility];  Service: General;  Laterality: N/A;  . LESION REMOVAL N/A 07/22/2018  Procedure: EXCISION OF PERIANAL LESION;  Surgeon: Ileana Roup, MD;  Location: [facility];  Service: General;  Laterality: N/A;  . LUMBAR LAMINECTOMY/DECOMPRESSION MICRODISCECTOMY Right 04/07/2018   Procedure: Right Lumbar Four-Five Lumbar Five-Sacral One  Laminectomy/Microdiscectomy;  Surgeon: Kristeen Miss, MD;  Location: [facility];  Service: Neurosurgery;  Laterality: Right;  Right Lumbar Four-Five Lumbar Five-Sacral One Laminectomy/Microdiscectomy  . RECTAL EXAM UNDER ANESTHESIA N/A 07/22/2018   Procedure: ANORECTAL EXAM UNDER ANESTHESIA ERAS PATHWAY;  Surgeon: Ileana Roup, MD;  Location: [facility];  Service: General;  Laterality: N/A;  . THYROIDECTOMY    . TONSILLECTOMY      I have reviewed the social history and family history with the patient and they are unchanged from previous note.  ALLERGIES:  is allergic to [employer] tartrate]; fioricet-codeine [butalbital-apap-caff-cod]; macrobid [nitrofurantoin]; codeine; hydrocodone-acetaminophen; keflex [cephalexin]; and percocet [oxycodone-acetaminophen].  MEDICATIONS:  Current Outpatient Medications  Medication Sig Dispense Refill  . acetaminophen (TYLENOL) 500 MG tablet Take 500 mg by mouth every 8 (eight) hours.     Marland Kitchen buPROPion (WELLBUTRIN XL) 300 MG 24 hr tablet Take 450 mg by mouth daily.     . Calcium Carbonate-Vitamin D 600-400 MG-UNIT per tablet Take 1 tablet by mouth daily.    . COMBIGAN 0.2-0.5 % ophthalmic solution Place 1 drop into the left eye 2 (two) times daily.     Marland Kitchen diltiazem (CARDIZEM CD) 180 MG 24 hr capsule Take 180 mg by mouth daily.    Marland Kitchen donepezil (ARICEPT) 5 MG tablet Take 5 mg by mouth at bedtime.    . gabapentin (NEURONTIN) 100 MG capsule Take 100 mg by mouth 2 (two) times daily.    . hydrocortisone (ANUSOL-HC) 25 MG suppository Place 25 mg rectally 2 (two) times daily as needed for hemorrhoids or anal itching.    Marland Kitchen ibuprofen (ADVIL,MOTRIN) 400 MG tablet Take 1 tablet (400 mg total) by mouth every 8 (eight) hours as needed for moderate pain. 60 tablet 0  . lamoTRIgine (LAMICTAL) 25 MG tablet Take 25-50 mg by mouth See admin instructions. Take 25 mg by mouth in the morning and take 50 mg by mouth in the evening    . latanoprost (XALATAN) 0.005 % ophthalmic  solution Place 1 drop into the left eye at bedtime.     . Lidocaine (ASPERCREME LIDOCAINE) 4 % PTCH Apply 1 patch topically daily as needed (for lower back / right hip pain).    . LORazepam (ATIVAN) 0.5 MG tablet Take 0.5 mg by mouth See admin instructions. Take 0.5 mg by mouth twice daily. Take 0.5 mg by mouth twice daily as needed for anxiety    . meclizine (ANTIVERT) 25 MG tablet Take 25 mg by mouth 2 (two) times daily as needed for dizziness.    . Melatonin 5 MG TABS Take 5 mg by mouth at bedtime.    . memantine (NAMENDA) 5 MG tablet Take 5 mg by mouth daily.    . mirabegron ER (MYRBETRIQ) 50 MG TB24 tablet Take 50 mg by mouth daily.    . Multiple Vitamins-Minerals (MULTIVITAMINS THER. W/MINERALS) TABS tablet Take 1 tablet by mouth daily.    . Multiple Vitamins-Minerals (PRESERVISION AREDS) CAPS Take 1 capsule by mouth daily.    Marland Kitchen omeprazole (PRILOSEC) 20 MG capsule Take 20 mg by mouth daily.    . ondansetron (ZOFRAN) 4 MG tablet Take 4 mg by mouth every 4 (four) hours as needed for nausea or vomiting.    . phenylephrine-shark liver oil-mineral oil-petrolatum (PREPARATION H) 0.25-3-14-71.9 %  rectal ointment Place 1 application rectally 4 (four) times daily as needed for hemorrhoids.    . polyethylene glycol (MIRALAX / GLYCOLAX) packet Take 17 g by mouth daily as needed for moderate constipation.    . pravastatin (PRAVACHOL) 40 MG tablet Take 40 mg by mouth daily.     Marland Kitchen PREMPRO 0.625-2.5 MG tablet Take 1 tablet by mouth daily.   3  . Probiotic Product (ALIGN) 4 MG CAPS Take 4 mg by mouth daily.     Marland Kitchen trimethoprim (TRIMPEX) 100 MG tablet Take 100 mg by mouth daily.    . Wheat Dextrin (BENEFIBER PO) Take 1 each by mouth See admin instructions. Mix 1 tablespoon twice daily.     No current facility-administered medications for this visit.     PHYSICAL EXAMINATION: ECOG PERFORMANCE STATUS: 2 - Symptomatic, <50% confined to bed  Vitals:   08/03/18 1110  BP: (!) 146/78  Pulse: 78  Resp: 16    Temp: 97.9 F (36.6 C)  SpO2: 96%   Filed Weights   08/03/18 1110  Weight: 154 lb 8 oz (70.1 kg)    GENERAL:alert, no distress and comfortable (+) uses a walker SKIN: skin color, texture, turgor are normal, no rashes or significant lesions EYES: normal, Conjunctiva are pink and non-injected, sclera clear OROPHARYNX:no exudate, no erythema and lips, buccal mucosa, and tongue normal  NECK: supple, thyroid normal size, non-tender, without nodularity LYMPH:  no palpable lymphadenopathy in the cervical, axillary or inguinal LUNGS: clear to auscultation and percussion with normal breathing effort HEART: regular rate & rhythm and no murmurs and no lower extremity edema ABDOMEN:abdomen soft, non-tender and normal bowel sounds (+) 2x3.5cm open srugical wound on the right side of the para-anal area with pinkish granulation tissue, no significant discharge or skin erythema. Musculoskeletal:no cyanosis of digits and no clubbing  NEURO: alert & oriented x 3 with fluent speech, no focal motor/sensory deficits  LABORATORY DATA:  I have reviewed the data as listed CBC Latest Ref Rng & Units 08/03/2018 07/20/2018 05/01/2018  WBC 4.0 - 10.5 K/uL 27.9(H) 27.8(H) 19.8(H)  Hemoglobin 12.0 - 15.0 g/dL 13.9 14.8 13.3  Hematocrit 36.0 - 46.0 % 41.5 46.8(H) 40.0  Platelets 150 - 400 K/uL 207 220 213     CMP Latest Ref Rng & Units 07/20/2018 05/01/2018 04/07/2018  Glucose 70 - 99 mg/dL 134(H) 99 89  BUN 8 - 23 mg/dL 12 12 11   Creatinine 0.44 - 1.00 mg/dL 1.05(H) 0.91 0.95  Sodium 135 - 145 mmol/L 139 143 144  Potassium 3.5 - 5.1 mmol/L 3.8 3.8 4.3  Chloride 98 - 111 mmol/L 108 108 108  CO2 22 - 32 mmol/L 24 25 26   Calcium 8.9 - 10.3 mg/dL 9.3 9.6 9.0  Total Protein 6.5 - 8.1 g/dL 6.6 6.6 -  Total Bilirubin 0.3 - 1.2 mg/dL 0.3 0.2(L) -  Alkaline Phos 38 - 126 U/L 56 84 -  AST 15 - 41 U/L 22 15 -  ALT 0 - 44 U/L 17 17 -      RADIOGRAPHIC STUDIES: I have personally reviewed the radiological images  as listed and agreed with the findings in the report. No results found.   ASSESSMENT & PLAN:  Bethany Oconnor is a 77 y.o. female with history of   1. Chronic lymphocytic leukemia (CLL), stage 0  -Diagnosed in 2015. She is currently on observation. -Labs reviewed, CBC showed WBC 27.9K, ALC 20.9, which has increased from 13.9K 3 months ago -Due to her significant increased  lymphocytosis, will monitor closely.  No sure if her recent anal cancer surgery contributes to her leukocytosis -Asymptomatic without B symptoms, no anemia or thrombocytopenia, she is elderly and frail, with multiple comorbidities, will continue observation for now. -close monitoring   2. Newly diagnosed Anal Squamous Cell Carcinoma  -She recently had wide local excision of an anal margin lesion, pathology reviewed invasive squamous cell carcinoma, 0.7cm with negative but close margin.   -I will discuss with Dr. Dema Severin to see if more surgery is planned. I recommend whole body PET scan for staging. -I discussed treatment options for anal cancer including surgery, radiation, and chemotherapy. If her PET scan negative, I think it is reasonable to observe her, due to her advanced age and comorbidities. -Patient is very concerned about her anal cancer, and has repeatedly ask questions about treatment.  I reassured her that early stage anal cancer is highly curable, and I will discuss her case in our GI tumor board.    3. HTN -f/u with PCP  4. Atrial Fibrillation -f/u with cardiology  5. Immunizations -I previously advised her to stay up-to-date with her immunizations  6.dementia  -she is on donepezil -She has a son, a care giver and a local power of attorney  Plan  -I will discuss with Dr. Dema Severin about her anal cancer management  -f/u in 1 month with lab   No problem-specific Assessment & Plan notes found for this encounter.   No orders of the defined types were placed in this encounter.  All questions were  answered. The patient knows to call the clinic with any problems, questions or concerns. No barriers to learning was detected. I spent 25 minutes counseling the patient face to face. The total time spent in the appointment was 30 minutes and more than 50% was on counseling and review of test results  I, Noor Dweik am acting as scribe for Dr. Truitt Merle.  I have reviewed the above documentation for accuracy and completeness, and I agree with the above.      Truitt Merle, MD 08/03/2018

## 2018-08-03 ENCOUNTER — Inpatient Hospital Stay: Payer: Medicare Other | Attending: Hematology

## 2018-08-03 ENCOUNTER — Telehealth: Payer: Self-pay | Admitting: Hematology

## 2018-08-03 ENCOUNTER — Inpatient Hospital Stay (HOSPITAL_BASED_OUTPATIENT_CLINIC_OR_DEPARTMENT_OTHER): Payer: Medicare Other | Admitting: Hematology

## 2018-08-03 ENCOUNTER — Encounter: Payer: Self-pay | Admitting: Hematology

## 2018-08-03 VITALS — BP 146/78 | HR 78 | Temp 97.9°F | Resp 16 | Ht 65.0 in | Wt 154.5 lb

## 2018-08-03 DIAGNOSIS — I10 Essential (primary) hypertension: Secondary | ICD-10-CM

## 2018-08-03 DIAGNOSIS — M549 Dorsalgia, unspecified: Secondary | ICD-10-CM | POA: Diagnosis not present

## 2018-08-03 DIAGNOSIS — C210 Malignant neoplasm of anus, unspecified: Secondary | ICD-10-CM

## 2018-08-03 DIAGNOSIS — I4891 Unspecified atrial fibrillation: Secondary | ICD-10-CM | POA: Diagnosis not present

## 2018-08-03 DIAGNOSIS — F0390 Unspecified dementia without behavioral disturbance: Secondary | ICD-10-CM | POA: Diagnosis not present

## 2018-08-03 DIAGNOSIS — Z79899 Other long term (current) drug therapy: Secondary | ICD-10-CM | POA: Insufficient documentation

## 2018-08-03 DIAGNOSIS — H3530 Unspecified macular degeneration: Secondary | ICD-10-CM | POA: Diagnosis not present

## 2018-08-03 DIAGNOSIS — C9110 Chronic lymphocytic leukemia of B-cell type not having achieved remission: Secondary | ICD-10-CM | POA: Diagnosis not present

## 2018-08-03 DIAGNOSIS — F329 Major depressive disorder, single episode, unspecified: Secondary | ICD-10-CM | POA: Diagnosis not present

## 2018-08-03 LAB — CBC WITH DIFFERENTIAL (CANCER CENTER ONLY)
ABS IMMATURE GRANULOCYTES: 0.07 10*3/uL (ref 0.00–0.07)
BASOS PCT: 0 %
Basophils Absolute: 0.1 10*3/uL (ref 0.0–0.1)
Eosinophils Absolute: 0.1 10*3/uL (ref 0.0–0.5)
Eosinophils Relative: 0 %
HCT: 41.5 % (ref 36.0–46.0)
HEMOGLOBIN: 13.9 g/dL (ref 12.0–15.0)
IMMATURE GRANULOCYTES: 0 %
Lymphocytes Relative: 76 %
Lymphs Abs: 20.9 10*3/uL — ABNORMAL HIGH (ref 0.7–4.0)
MCH: 33.1 pg (ref 26.0–34.0)
MCHC: 33.5 g/dL (ref 30.0–36.0)
MCV: 98.8 fL (ref 80.0–100.0)
MONOS PCT: 3 %
Monocytes Absolute: 0.8 10*3/uL (ref 0.1–1.0)
NEUTROS ABS: 6 10*3/uL (ref 1.7–7.7)
NEUTROS PCT: 21 %
PLATELETS: 207 10*3/uL (ref 150–400)
RBC: 4.2 MIL/uL (ref 3.87–5.11)
RDW: 14.1 % (ref 11.5–15.5)
WBC: 27.9 10*3/uL — AB (ref 4.0–10.5)
nRBC: 0 % (ref 0.0–0.2)

## 2018-08-03 NOTE — Telephone Encounter (Signed)
Printed calendar and avs. °

## 2018-08-07 ENCOUNTER — Other Ambulatory Visit: Payer: Self-pay | Admitting: Hematology

## 2018-08-07 ENCOUNTER — Telehealth: Payer: Self-pay

## 2018-08-07 DIAGNOSIS — C210 Malignant neoplasm of anus, unspecified: Secondary | ICD-10-CM

## 2018-08-07 NOTE — Telephone Encounter (Signed)
Received call from [facility] patient's caregiver that patient wants appointment to discuss her treatment right away. She had appointment with Dr. Dema Severin and the patient  has ruled out another surgery. Concerned about side effects of chemotherapy. Needing more information.  Explained to her caregiver that I will need to discuss with Dr. Burr Medico and let her know her thoughts and when we can see the patient.  [phone]

## 2018-08-07 NOTE — Telephone Encounter (Signed)
Darlena, could you get her PET approved?   Malachy Mood, please schedule her to see me after her PET. Please let pt know, thanks   Truitt Merle MD

## 2018-08-11 ENCOUNTER — Telehealth: Payer: Self-pay | Admitting: *Deleted

## 2018-08-11 ENCOUNTER — Telehealth: Payer: Self-pay | Admitting: Hematology

## 2018-08-11 NOTE — Telephone Encounter (Signed)
I will schedule her to see me on 12/11 then, thanks  Truitt Merle MD

## 2018-08-11 NOTE — Telephone Encounter (Signed)
Received call from caregiver, Athena Masse stating pt has PET tomorrow & not scheduled to see Dr Burr Medico until 09/09/18.  She states pt very anxious & wants to move appt to sooner after PET for results.  Message to Dr Burr Medico.

## 2018-08-11 NOTE — Telephone Encounter (Signed)
Scheduled appt per 12/10 sch message - pt is aware of appt date and time   

## 2018-08-12 ENCOUNTER — Ambulatory Visit (HOSPITAL_COMMUNITY)
Admission: RE | Admit: 2018-08-12 | Discharge: 2018-08-12 | Disposition: A | Discharge status: Home / Self Care | Payer: Medicare Other | Source: Ambulatory Visit | Attending: Hematology | Admitting: Hematology

## 2018-08-12 DIAGNOSIS — C210 Malignant neoplasm of anus, unspecified: Secondary | ICD-10-CM | POA: Insufficient documentation

## 2018-08-12 LAB — GLUCOSE, CAPILLARY: GLUCOSE-CAPILLARY: 94 mg/dL (ref 70–99)

## 2018-08-12 MED ORDER — FLUDEOXYGLUCOSE F - 18 (FDG) INJECTION
7.6000 | Freq: Once | INTRAVENOUS | Status: AC
  Administered 2018-08-12: 7.6 via INTRAVENOUS

## 2018-08-12 NOTE — Progress Notes (Signed)
[place]   [phone] [phone]) [phone]   Clinic Follow up Note   Patient Care Team: Crist Infante, MD as PCP - General (Internal Medicine) Sherren Mocha, MD as PCP - Cardiology (Cardiology) Crist Infante, MD (Internal Medicine) Newton Pigg, MD as Consulting Physician (Obstetrics and Gynecology) Truitt Merle, MD as Consulting Physician (Hematology) Leta Baptist, MD as Consulting Physician (Otolaryngology) Bethany Stalls, MD as Consulting Physician (Ophthalmology) Paulla Dolly Tamala Fothergill, DPM as Consulting Physician (Podiatry) Harriett Sine, MD as Consulting Physician (Dermatology) Erroll Luna, MD as Consulting Physician (General Surgery) 08/13/2018  CHIEF COMPLAINT: 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 female who is here for follow-up. Since our last visit, she had a PET scan that showed uptake in one right inguinal LN. She saw Dr. Dema Severin last week.   Today, she is here with her caregiver and POA. She is concerned about her recent PET scan. She denies rectal pain. She is not eating well and feels nauseated after eating. She noticed that she has lost weight.  Pertinent positives and negatives of review of systems are listed and detailed within the above HPI.  REVIEW OF SYSTEMS:   Constitutional: Denies fevers, chills (+) weight loss (+) low appetite Eyes: Denies blurriness of vision Ears, nose, mouth, throat, and face: Denies mucositis or sore throat Respiratory: Denies cough, dyspnea or wheezes Cardiovascular: Denies palpitation, chest discomfort or lower extremity swelling Gastrointestinal:  Denies heartburn or change in bowel habits (+) nausea Skin: Denies abnormal skin rashes Lymphatics: Denies new lymphadenopathy or easy bruising Neurological:Denies numbness, tingling or new weaknesses Behavioral/Psych: Mood is stable, no new changes  All other systems were reviewed  with the patient and are negative.  MEDICAL HISTORY:  Past Medical History:  Diagnosis Date  . Anxiety   . Arthritis   . Cerebral aneurysm    s/p endovascular colling 2008  . CLL (chronic lymphocytic leukemia) (HCC)    CLL  . Depression   . Hearing loss   . Macular degeneration   . Neuropathy   . Paroxysmal atrial fibrillation (HCC)   . Pneumonia    08-04-17  . PONV (postoperative nausea and vomiting)   . Thyroid disease     SURGICAL HISTORY: Past Surgical History:  Procedure Laterality Date  . ANEURYSM COILING    . CATARACT EXTRACTION     bilateral  . CERVICAL CONE BIOPSY    . COLONOSCOPY    . EVALUATION UNDER ANESTHESIA WITH HEMORRHOIDECTOMY N/A 09/03/2017   Procedure: EXAM UNDER ANESTHESIA, EXCISION OF ANAL LESION, POSSIBLE HEMORRHOIDECTOMY;  Surgeon: Ileana Roup, MD;  Location: [facility];  Service: General;  Laterality: N/A;  . LESION REMOVAL N/A 07/22/2018   Procedure: EXCISION OF PERIANAL LESION;  Surgeon: Ileana Roup, MD;  Location: [place];  Service: General;  Laterality: N/A;  . LUMBAR LAMINECTOMY/DECOMPRESSION MICRODISCECTOMY Right 04/07/2018   Procedure: Right Lumbar Four-Five Lumbar Five-Sacral One Laminectomy/Microdiscectomy;  Surgeon: Kristeen Miss, MD;  Location: [place];  Service: Neurosurgery;  Laterality: Right;  Right Lumbar Four-Five Lumbar Five-Sacral One Laminectomy/Microdiscectomy  . RECTAL EXAM UNDER ANESTHESIA N/A 07/22/2018   Procedure: ANORECTAL EXAM UNDER ANESTHESIA ERAS PATHWAY;  Surgeon: Ileana Roup, MD;  Location: [place];  Service: General;  Laterality: N/A;  . THYROIDECTOMY    . TONSILLECTOMY      I have reviewed the social history and family history with the patient and they are unchanged from previous note.  ALLERGIES:  is allergic to [employer] tartrate]; fioricet-codeine [butalbital-apap-caff-cod]; macrobid [nitrofurantoin]; codeine; hydrocodone-acetaminophen; keflex [cephalexin]; and percocet  [oxycodone-acetaminophen].  MEDICATIONS:  Current Outpatient Medications  Medication Sig Dispense Refill  . acetaminophen (TYLENOL) 500 MG tablet Take 500 mg by mouth every 8 (eight) hours.     Marland Kitchen buPROPion (WELLBUTRIN XL) 300 MG 24 hr tablet Take 450 mg by mouth daily.     . Calcium Carbonate-Vitamin D 600-400 MG-UNIT per tablet Take 1 tablet by mouth daily.    . COMBIGAN 0.2-0.5 % ophthalmic solution Place 1 drop into the left eye 2 (two) times daily.     Marland Kitchen diltiazem (CARDIZEM CD) 180 MG 24 hr capsule Take 180 mg by mouth daily.    Marland Kitchen donepezil (ARICEPT) 5 MG tablet Take 5 mg by mouth at bedtime.    . gabapentin (NEURONTIN) 100 MG capsule Take 100 mg by mouth 2 (two) times daily.    . hydrocortisone (ANUSOL-HC) 25 MG suppository Place 25 mg rectally 2 (two) times daily as needed for hemorrhoids or anal itching.    Marland Kitchen ibuprofen (ADVIL,MOTRIN) 400 MG tablet Take 1 tablet (400 mg total) by mouth every 8 (eight) hours as needed for moderate pain. 60 tablet 0  . lamoTRIgine (LAMICTAL) 25 MG tablet Take 25-50 mg by mouth See admin instructions. Take 25 mg by mouth in the morning and take 50 mg by mouth in the evening    . latanoprost (XALATAN) 0.005 % ophthalmic solution Place 1 drop into the left eye at bedtime.     . Lidocaine (ASPERCREME LIDOCAINE) 4 % PTCH Apply 1 patch topically daily as needed (for lower back / right hip pain).    . LORazepam (ATIVAN) 0.5 MG tablet Take 0.5 mg by mouth See admin instructions. Take 0.5 mg by mouth twice daily. Take 0.5 mg by mouth twice daily as needed for anxiety    . meclizine (ANTIVERT) 25 MG tablet Take 25 mg by mouth 2 (two) times daily as needed for dizziness.    . Melatonin 5 MG TABS Take 5 mg by mouth at bedtime.    . memantine (NAMENDA) 5 MG tablet Take 5 mg by mouth daily.    . mirabegron ER (MYRBETRIQ) 50 MG TB24 tablet Take 50 mg by mouth daily.    . Multiple Vitamins-Minerals (MULTIVITAMINS THER. W/MINERALS) TABS tablet Take 1 tablet by mouth daily.     . Multiple Vitamins-Minerals (PRESERVISION AREDS) CAPS Take 1 capsule by mouth daily.    Marland Kitchen omeprazole (PRILOSEC) 20 MG capsule Take 20 mg by mouth daily.    . ondansetron (ZOFRAN) 4 MG tablet Take 4 mg by mouth every 4 (four) hours as needed for nausea or vomiting.    . phenylephrine-shark liver oil-mineral oil-petrolatum (PREPARATION H) 0.25-3-14-71.9 % rectal ointment Place 1 application rectally 4 (four) times daily as needed for hemorrhoids.    . polyethylene glycol (MIRALAX / GLYCOLAX) packet Take 17 g by mouth daily as needed for moderate constipation.    . pravastatin (PRAVACHOL) 40 MG tablet Take 40 mg by mouth daily.     Marland Kitchen PREMPRO 0.625-2.5 MG tablet Take 1 tablet by mouth daily.   3  . Probiotic Product (ALIGN) 4 MG CAPS Take 4 mg by mouth daily.     Marland Kitchen trimethoprim (TRIMPEX) 100 MG tablet Take 100 mg by mouth daily.    . Wheat Dextrin (BENEFIBER PO) Take 1 each by mouth See admin instructions. Mix 1 tablespoon twice daily.     No current facility-administered  medications for this visit.     PHYSICAL EXAMINATION: ECOG PERFORMANCE STATUS: 3 - Symptomatic, >50% confined to bed  Vitals:   08/13/18 1529  BP: 130/67  Pulse: 85  Resp: 18  Temp: 98.1 F (36.7 C)  SpO2: 94%   Filed Weights   08/13/18 1529  Weight: 155 lb 12.8 oz (70.7 kg)    GENERAL:alert, no distress and comfortable (+) on wheelchair SKIN: skin color, texture, turgor are normal, no rashes or significant lesions EYES: normal, Conjunctiva are pink and non-injected, sclera clear OROPHARYNX:no exudate, no erythema and lips, buccal mucosa, and tongue normal  (+) hard on hearing NECK: supple, thyroid normal size, non-tender, without nodularity LYMPH:  no palpable lymphadenopathy in the cervical, axillary (+) small palpable inguinal LN on the right side, mildly tender LUNGS: clear to auscultation and percussion with normal breathing effort HEART: regular rate & rhythm and no murmurs and no lower extremity  edema ABDOMEN:abdomen soft, non-tender and normal bowel sounds RECTUM: (+) healing well, wound is clean with no discharge Musculoskeletal:no cyanosis of digits and no clubbing  NEURO: alert & oriented x 3 with fluent speech, no focal motor/sensory deficits  LABORATORY DATA:  I have reviewed the data as listed CBC Latest Ref Rng & Units 08/03/2018 07/20/2018 05/01/2018  WBC 4.0 - 10.5 K/uL 27.9(H) 27.8(H) 19.8(H)  Hemoglobin 12.0 - 15.0 g/dL 13.9 14.8 13.3  Hematocrit 36.0 - 46.0 % 41.5 46.8(H) 40.0  Platelets 150 - 400 K/uL 207 220 213     CMP Latest Ref Rng & Units 07/20/2018 05/01/2018 04/07/2018  Glucose 70 - 99 mg/dL 134(H) 99 89  BUN 8 - 23 mg/dL 12 12 11   Creatinine 0.44 - 1.00 mg/dL 1.05(H) 0.91 0.95  Sodium 135 - 145 mmol/L 139 143 144  Potassium 3.5 - 5.1 mmol/L 3.8 3.8 4.3  Chloride 98 - 111 mmol/L 108 108 108  CO2 22 - 32 mmol/L 24 25 26   Calcium 8.9 - 10.3 mg/dL 9.3 9.6 9.0  Total Protein 6.5 - 8.1 g/dL 6.6 6.6 -  Total Bilirubin 0.3 - 1.2 mg/dL 0.3 0.2(L) -  Alkaline Phos 38 - 126 U/L 56 84 -  AST 15 - 41 U/L 22 15 -  ALT 0 - 44 U/L 17 17 -      RADIOGRAPHIC STUDIES: I have personally reviewed the radiological images as listed and agreed with the findings in the report.  08/12/2018 PET Scan IMPRESSION: 1. Focal hypermetabolism in the right anus consistent with known primary tumor. 2. Hypermetabolic right inguinal metastatic lymph node. No other metastatic disease identified in the neck, chest, abdomen, or pelvis. 3. Hypermetabolic calcified left pleural plaques. These plaques were visualized on CT chest of 09/06/2014 and are unchanged in the interval. No right hemithoracic pleural plaques. Given unilateral disease, prior hemothorax or empyema would be a consideration. 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 4. 3.9 x 2.3 cm left adnexal cyst without hypermetabolic activity. Attention on follow-up recommended. 5.  Aortic  Atherosclerois (ICD10-170.0)  ASSESSMENT & PLAN:  CHIZUKO TRINE is a 77 y.o. female with history of  1. Chroniclymphocyticleukemia (CLL), stage 0 -Diagnosed in 2015. Currently under observation. -Was recently found to have worsening leukocytosis and lymphocytosis, concerning for disease progression. -She has also developed low appetite, weight loss, no fever or night sweats, nausea if it is related to her recent anal cancer and surgery  2. Anal Squamous Cell Carcinoma  -Diagnosed in 07/2018. S/p surgical resection with close margin  -I discussed and  reviewed her recent staging PET scan results. -I discussed that the right inguinal LN found might be enlarged due to healing from recent surgery, CLL, or metastasis. It's palpable and tender on physical exam.  No distant metastasis on the PET. -I discussed the need to biopsy the LN to r/u malignancy  -if biopsy confirm anal cancer metastasis, I would recommend radiation, with or without adjuvant chemotherapy. She is not a good candidate for Nigra regimen with concurrent RT due to her age and medical comorbidities.   3. HTN -f/u with PCP  4. Atrial Fibrillation -f/u with cardiology  5. Immunizations -I previously advised her to stay up-to-date with her immunizations  6. Dementia -Currently on Donepezil. Continue. -Her son is her care giver and local power of attorney  7. Nausea and low appetite, weight loss  -I advised her to take Ensure supplements -probably related to her recent anal cancer and surgery   Plan  -I will refer to IR for right inguinal LN biopsy -f/u after biopsy   No problem-specific Assessment & Plan notes found for this encounter.   Orders Placed This Encounter  Procedures  . [place] CORE BIOPSY (LYMPH NODES)    Standing Status:   Future    Standing Expiration Date:   10/15/2019    Scheduling Instructions:     History of recent anal cancer and CLL    Order Specific Question:   Lab orders requested (DO NOT  place separate lab orders, these will be automatically ordered during procedure specimen collection):    Answer:   Surgical Pathology    Order Specific Question:   Reason for Exam (SYMPTOM  OR DIAGNOSIS REQUIRED)    Answer:   right groin lympph node biopsy to rule out anal cancer or lymphoma    Order Specific Question:   Preferred imaging location?    Answer:   [facility]   All questions were answered. The patient knows to call the clinic with any problems, questions or concerns. No barriers to learning was detected. I spent 20 minutes counseling the patient face to face. The total time spent in the appointment was 25 minutes and more than 50% was on counseling and review of test results  I, Noor Dweik am acting as scribe for Dr. Truitt Merle.  I have reviewed the above documentation for accuracy and completeness, and I agree with the above.     Truitt Merle, MD 08/13/2018

## 2018-08-13 ENCOUNTER — Encounter: Payer: Self-pay | Admitting: Hematology

## 2018-08-13 ENCOUNTER — Inpatient Hospital Stay (HOSPITAL_BASED_OUTPATIENT_CLINIC_OR_DEPARTMENT_OTHER): Payer: Medicare Other | Admitting: Hematology

## 2018-08-13 VITALS — BP 130/67 | HR 85 | Temp 98.1°F | Resp 18 | Ht 65.0 in | Wt 155.8 lb

## 2018-08-13 DIAGNOSIS — C210 Malignant neoplasm of anus, unspecified: Secondary | ICD-10-CM

## 2018-08-13 DIAGNOSIS — C9110 Chronic lymphocytic leukemia of B-cell type not having achieved remission: Secondary | ICD-10-CM

## 2018-08-13 DIAGNOSIS — M549 Dorsalgia, unspecified: Secondary | ICD-10-CM | POA: Diagnosis not present

## 2018-08-13 DIAGNOSIS — H3530 Unspecified macular degeneration: Secondary | ICD-10-CM | POA: Diagnosis not present

## 2018-08-13 DIAGNOSIS — F329 Major depressive disorder, single episode, unspecified: Secondary | ICD-10-CM

## 2018-08-13 DIAGNOSIS — I4891 Unspecified atrial fibrillation: Secondary | ICD-10-CM

## 2018-08-13 DIAGNOSIS — I10 Essential (primary) hypertension: Secondary | ICD-10-CM

## 2018-08-13 DIAGNOSIS — F0390 Unspecified dementia without behavioral disturbance: Secondary | ICD-10-CM

## 2018-08-13 DIAGNOSIS — Z79899 Other long term (current) drug therapy: Secondary | ICD-10-CM

## 2018-08-19 ENCOUNTER — Other Ambulatory Visit: Payer: Self-pay | Admitting: Radiology

## 2018-08-20 ENCOUNTER — Encounter (HOSPITAL_COMMUNITY): Payer: Self-pay

## 2018-08-20 ENCOUNTER — Ambulatory Visit (HOSPITAL_COMMUNITY): Admission: RE | Admit: 2018-08-20 | Payer: Medicare Other | Source: Ambulatory Visit

## 2018-08-20 ENCOUNTER — Other Ambulatory Visit: Payer: Self-pay | Admitting: Student

## 2018-08-20 ENCOUNTER — Ambulatory Visit (HOSPITAL_COMMUNITY): Payer: Medicare Other

## 2018-08-20 ENCOUNTER — Telehealth: Payer: Self-pay | Admitting: Student

## 2018-08-20 NOTE — Progress Notes (Signed)
Patient was scheduled for an image-guided right groin lymph node biopsy tentatively for today at 1300.  Upon arrival, patient admits to eating breakfast this AM at 0830.  Discussed case with Dr. Earleen Newport.  Informed patient of options- to proceed with procedure today with local anesthetic only versus rescheduling procedure with moderate sedation. Patient requests rescheduling with moderate sedation. Procedure rescheduled for tomorrow 08/21/2018 at 1300. Informed patient to arrive at 1100 tomorrow, and no eating/drinking after 0700 tomorrow AM. IR team made aware.  All questions answered and concerns addressed. Patient and caregiver convey understanding and agree with plan.  Bea Graff Louk, PA-C 08/20/2018, 11:55 AM

## 2018-08-21 ENCOUNTER — Ambulatory Visit (HOSPITAL_COMMUNITY)
Admission: RE | Admit: 2018-08-21 | Discharge: 2018-08-21 | Disposition: A | Discharge status: Home / Self Care | Payer: Medicare Other | Source: Ambulatory Visit | Attending: Hematology | Admitting: Hematology

## 2018-08-21 ENCOUNTER — Other Ambulatory Visit (HOSPITAL_COMMUNITY): Payer: Medicare Other

## 2018-08-21 ENCOUNTER — Encounter (HOSPITAL_COMMUNITY): Payer: Self-pay

## 2018-08-21 ENCOUNTER — Ambulatory Visit (HOSPITAL_COMMUNITY): Payer: Medicare Other

## 2018-08-21 DIAGNOSIS — C210 Malignant neoplasm of anus, unspecified: Secondary | ICD-10-CM | POA: Diagnosis not present

## 2018-08-21 DIAGNOSIS — Z9889 Other specified postprocedural states: Secondary | ICD-10-CM | POA: Diagnosis not present

## 2018-08-21 DIAGNOSIS — Z809 Family history of malignant neoplasm, unspecified: Secondary | ICD-10-CM | POA: Insufficient documentation

## 2018-08-21 DIAGNOSIS — M1990 Unspecified osteoarthritis, unspecified site: Secondary | ICD-10-CM | POA: Diagnosis not present

## 2018-08-21 DIAGNOSIS — I480 Paroxysmal atrial fibrillation: Secondary | ICD-10-CM | POA: Insufficient documentation

## 2018-08-21 DIAGNOSIS — I700 Atherosclerosis of aorta: Secondary | ICD-10-CM | POA: Insufficient documentation

## 2018-08-21 DIAGNOSIS — F419 Anxiety disorder, unspecified: Secondary | ICD-10-CM | POA: Diagnosis not present

## 2018-08-21 DIAGNOSIS — Z87891 Personal history of nicotine dependence: Secondary | ICD-10-CM | POA: Insufficient documentation

## 2018-08-21 DIAGNOSIS — C9110 Chronic lymphocytic leukemia of B-cell type not having achieved remission: Secondary | ICD-10-CM | POA: Diagnosis not present

## 2018-08-21 DIAGNOSIS — C779 Secondary and unspecified malignant neoplasm of lymph node, unspecified: Secondary | ICD-10-CM | POA: Diagnosis not present

## 2018-08-21 DIAGNOSIS — Z79899 Other long term (current) drug therapy: Secondary | ICD-10-CM | POA: Insufficient documentation

## 2018-08-21 DIAGNOSIS — F329 Major depressive disorder, single episode, unspecified: Secondary | ICD-10-CM | POA: Diagnosis not present

## 2018-08-21 LAB — CBC WITH DIFFERENTIAL/PLATELET
Abs Immature Granulocytes: 0.07 10*3/uL (ref 0.00–0.07)
Basophils Absolute: 0.1 10*3/uL (ref 0.0–0.1)
Basophils Relative: 0 %
Eosinophils Absolute: 0.1 10*3/uL (ref 0.0–0.5)
Eosinophils Relative: 0 %
HCT: 43 % (ref 36.0–46.0)
Hemoglobin: 14.1 g/dL (ref 12.0–15.0)
Immature Granulocytes: 0 %
Lymphocytes Relative: 80 %
Lymphs Abs: 18.6 10*3/uL — ABNORMAL HIGH (ref 0.7–4.0)
MCH: 33.3 pg (ref 26.0–34.0)
MCHC: 32.8 g/dL (ref 30.0–36.0)
MCV: 101.4 fL — ABNORMAL HIGH (ref 80.0–100.0)
Monocytes Absolute: 0.9 10*3/uL (ref 0.1–1.0)
Monocytes Relative: 4 %
Neutro Abs: 3.8 10*3/uL (ref 1.7–7.7)
Neutrophils Relative %: 16 %
Platelets: 207 10*3/uL (ref 150–400)
RBC: 4.24 MIL/uL (ref 3.87–5.11)
RDW: 14.1 % (ref 11.5–15.5)
WBC Morphology: ABNORMAL
WBC: 23.4 10*3/uL — AB (ref 4.0–10.5)
nRBC: 0 % (ref 0.0–0.2)

## 2018-08-21 LAB — BASIC METABOLIC PANEL
Anion gap: 11 (ref 5–15)
BUN: 10 mg/dL (ref 8–23)
CHLORIDE: 109 mmol/L (ref 98–111)
CO2: 21 mmol/L — ABNORMAL LOW (ref 22–32)
CREATININE: 0.71 mg/dL (ref 0.44–1.00)
Calcium: 8.7 mg/dL — ABNORMAL LOW (ref 8.9–10.3)
GFR calc non Af Amer: >60 mL/min (ref >60)
Glucose, Bld: 100 mg/dL — ABNORMAL HIGH (ref 70–99)
Potassium: 3.8 mmol/L (ref 3.5–5.1)
SODIUM: 141 mmol/L (ref 135–145)

## 2018-08-21 LAB — PROTIME-INR
INR: 0.93
Prothrombin Time: 12.4 seconds (ref 11.4–15.2)

## 2018-08-21 MED ORDER — FENTANYL CITRATE (PF) 100 MCG/2ML IJ SOLN
INTRAMUSCULAR | Status: AC
  Filled 2018-08-21: qty 2

## 2018-08-21 MED ORDER — LIDOCAINE HCL 1 % IJ SOLN
INTRAMUSCULAR | Status: AC
  Filled 2018-08-21: qty 20

## 2018-08-21 MED ORDER — SODIUM CHLORIDE 0.9 % IV SOLN: INTRAVENOUS | Status: DC

## 2018-08-21 MED ORDER — MIDAZOLAM HCL 2 MG/2ML IJ SOLN
INTRAMUSCULAR | Status: AC
  Filled 2018-08-21: qty 2

## 2018-08-21 MED ORDER — FENTANYL CITRATE (PF) 100 MCG/2ML IJ SOLN
INTRAMUSCULAR | Status: AC | PRN
  Administered 2018-08-21 (×2): 25 ug via INTRAVENOUS

## 2018-08-21 MED ORDER — MIDAZOLAM HCL 2 MG/2ML IJ SOLN
INTRAMUSCULAR | Status: AC | PRN
  Administered 2018-08-21: 0.5 mg via INTRAVENOUS
  Administered 2018-08-21: 1 mg via INTRAVENOUS
  Administered 2018-08-21: 0.5 mg via INTRAVENOUS

## 2018-08-21 NOTE — H&P (Signed)
Chief Complaint: Patient was seen in consultation today for inguinal lymph node biopsy  Referring Physician(s): Feng,Yan  Supervising Physician: Daryll Brod  Patient Status: [facility] - Out-pt  History of Present Illness: Bethany Oconnor is a 77 y.o. female with a past medical history significant for anxiety, depression, hearing loss, cerebral aneurysm s/p coiling 2008, paroxysmal a.fib, CLL and recently diagnosed anal cancer who presents today for inguinal lymph node biopsy at the request of Dr. Burr Medico. 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 She underwent PET scan 08/12/18 which showed focal hypermetabolism in the right anus consistent with known primary tumor, hypermetabolic right inguinal metastatic lymph node and hypermetabolic calcified left pleural plaques which are unchanged from previous imaging. She is followed by Dr. Burr Medico who requests biopsy of the right inguinal hypermetabolic lymph node.  Patient presents with her caregiver/POA today, she reports she was here yesterday but was not told that she wasn't allowed to eat and as such had breakfast prior to arrival. She was offered biopsy with local anesthesia only which she declined and was rescheduled for today. She reports last PO intake was 6 am this morning. She denies any complaints today except for being hungry and anxious regarding when she will receive her biopsy results.   Past Medical History:  Diagnosis Date  . Anxiety   . Arthritis   . Cerebral aneurysm    s/p endovascular colling 2008  . CLL (chronic lymphocytic leukemia) (HCC)    CLL  . Depression   . Hearing loss   . Macular degeneration   . Neuropathy   . Paroxysmal atrial fibrillation (HCC)   . Pneumonia    08-04-17  . PONV (postoperative nausea and vomiting)   . Thyroid disease     Past Surgical History:    Procedure Laterality Date  . ANEURYSM COILING    . CATARACT EXTRACTION     bilateral  . CERVICAL CONE BIOPSY    . COLONOSCOPY    . EVALUATION UNDER ANESTHESIA WITH HEMORRHOIDECTOMY N/A 09/03/2017   Procedure: EXAM UNDER ANESTHESIA, EXCISION OF ANAL LESION, POSSIBLE HEMORRHOIDECTOMY;  Surgeon: Ileana Roup, MD;  Location: [facility];  Service: General;  Laterality: N/A;  . LESION REMOVAL N/A 07/22/2018   Procedure: EXCISION OF PERIANAL LESION;  Surgeon: Ileana Roup, MD;  Location: [place];  Service: General;  Laterality: N/A;  . LUMBAR LAMINECTOMY/DECOMPRESSION MICRODISCECTOMY Right 04/07/2018   Procedure: Right Lumbar Four-Five Lumbar Five-Sacral One Laminectomy/Microdiscectomy;  Surgeon: Kristeen Miss, MD;  Location: [place];  Service: Neurosurgery;  Laterality: Right;  Right Lumbar Four-Five Lumbar Five-Sacral One Laminectomy/Microdiscectomy  . RECTAL EXAM UNDER ANESTHESIA N/A 07/22/2018   Procedure: ANORECTAL EXAM UNDER ANESTHESIA ERAS PATHWAY;  Surgeon: Ileana Roup, MD;  Location: [place];  Service: General;  Laterality: N/A;  . THYROIDECTOMY    . TONSILLECTOMY      Allergies: Ambien [zolpidem tartrate]; Fioricet-codeine [butalbital-apap-caff-cod]; Macrobid [nitrofurantoin]; Codeine; Hydrocodone-acetaminophen; Keflex [cephalexin]; and Percocet [oxycodone-acetaminophen]  Medications: Prior to Admission medications   Medication Sig Start Date End Date Taking? Authorizing Provider  acetaminophen (TYLENOL) 500 MG tablet Take 500 mg by mouth every 8 (eight) hours.    Yes [provider]  buPROPion (WELLBUTRIN XL) 300 MG 24 hr tablet Take 450 mg by mouth daily.  02/11/18  Yes [provider]  Calcium Carbonate-Vitamin D 600-400 MG-UNIT per tablet Take 1 tablet by mouth  daily. 08/19/13  Yes Sherren Mocha, MD  COMBIGAN 0.2-0.5 % ophthalmic solution Place 1 drop into the left eye 2 (two) times daily.  09/12/17  Yes [provider]  diltiazem (CARDIZEM  CD) 180 MG 24 hr capsule Take 180 mg by mouth daily.   Yes [provider]  donepezil (ARICEPT) 5 MG tablet Take 5 mg by mouth at bedtime.   Yes [provider]  gabapentin (NEURONTIN) 100 MG capsule Take 100 mg by mouth 2 (two) times daily.   Yes [provider]  hydrocortisone (ANUSOL-HC) 25 MG suppository Place 25 mg rectally 2 (two) times daily as needed for hemorrhoids or anal itching.   Yes [provider]  ibuprofen (ADVIL,MOTRIN) 400 MG tablet Take 1 tablet (400 mg total) by mouth every 8 (eight) hours as needed for moderate pain. 03/16/18  Yes Jessy Oto, MD  lamoTRIgine (LAMICTAL) 25 MG tablet Take 25-50 mg by mouth See admin instructions. Take 25 mg by mouth in the morning and take 50 mg by mouth in the evening 01/28/18  Yes [provider]  latanoprost (XALATAN) 0.005 % ophthalmic solution Place 1 drop into the left eye at bedtime.    Yes [provider]  Lidocaine (ASPERCREME LIDOCAINE) 4 % PTCH Apply 1 patch topically daily as needed (for lower back / right hip pain).   Yes [provider]  LORazepam (ATIVAN) 0.5 MG tablet Take 0.5 mg by mouth See admin instructions. Take 0.5 mg by mouth twice daily. Take 0.5 mg by mouth twice daily as needed for anxiety   Yes [provider]  meclizine (ANTIVERT) 25 MG tablet Take 25 mg by mouth 2 (two) times daily as needed for dizziness.   Yes [provider]  Melatonin 5 MG TABS Take 5 mg by mouth at bedtime.   Yes [provider]  memantine (NAMENDA) 5 MG tablet Take 5 mg by mouth daily.   Yes [provider]  mirabegron ER (MYRBETRIQ) 50 MG TB24 tablet Take 50 mg by mouth daily.   Yes [provider]  Multiple Vitamins-Minerals (MULTIVITAMINS THER. W/MINERALS) TABS tablet Take 1 tablet by mouth daily.   Yes [provider]  Multiple Vitamins-Minerals (PRESERVISION AREDS) CAPS Take 1 capsule by mouth daily.   Yes [provider]  omeprazole (PRILOSEC) 20 MG capsule Take 20 mg by mouth daily.   Yes [provider]  ondansetron (ZOFRAN) 4 MG tablet Take 4 mg by mouth every 4 (four) hours as needed for nausea or vomiting.   Yes [provider]  phenylephrine-shark liver oil-mineral oil-petrolatum (PREPARATION H) 0.25-3-14-71.9 % rectal ointment Place 1 application rectally 4 (four) times daily as needed for hemorrhoids.   Yes [provider]  polyethylene glycol (MIRALAX / GLYCOLAX) packet Take 17 g by mouth daily as needed for moderate constipation.   Yes [provider]  pravastatin (PRAVACHOL) 40 MG tablet Take 40 mg by mouth daily.  02/10/13  Yes Sherren Mocha, MD  PREMPRO 0.625-2.5 MG tablet Take 1 tablet by mouth daily.  08/22/14  Yes [provider]  Probiotic Product (ALIGN) 4 MG CAPS Take 4 mg by mouth daily.    Yes [provider]  trimethoprim (TRIMPEX) 100 MG tablet Take 100 mg by mouth daily.   Yes [provider]  Wheat Dextrin (BENEFIBER PO) Take 1 each by mouth See admin instructions. Mix 1 tablespoon twice daily.   Yes [provider]     Family History  Problem  Relation Age of Onset  . Cancer Father 49       died  . Cerebral aneurysm Mother 17       died  . Heart attack Brother     Social History   Socioeconomic History  . Marital status: Widowed    Spouse name: Not on file  . Number of children: 2  . Years of education: Not on file  . Highest education level: Not on file  Occupational History    Employer: RETIRED  Social Needs  . Financial resource strain: Not on file  . Food insecurity:    Worry: Not on file    Inability: Not on file  . Transportation needs:    Medical: Not on file    Non-medical: Not on file  Tobacco Use  . Smoking status: Former Smoker    Last attempt to quit: 09/02/1988    Years since quitting: 29.9  . Smokeless tobacco: Never Used  Substance and Sexual Activity  . Alcohol  use: No    Alcohol/week: 1.0 - 2.0 standard drinks    Types: 1 - 2 Glasses of wine per week    Comment: daily for 50 years  No longer drinks 09-01-17  . Drug use: No  . Sexual activity: Not Currently  Lifestyle  . Physical activity:    Days per week: Not on file    Minutes per session: Not on file  . Stress: Not on file  Relationships  . Social connections:    Talks on phone: Not on file    Gets together: Not on file    Attends religious service: Not on file    Active member of club or organization: Not on file    Attends meetings of clubs or organizations: Not on file    Relationship status: Not on file  Other Topics Concern  . Not on file  Social History Narrative  . Not on file     Review of Systems: A 12 point ROS discussed and pertinent positives are indicated in the HPI above.  All other systems are negative.  Review of Systems  Constitutional: Positive for fatigue. Negative for chills and fever.  Respiratory: Negative for cough and shortness of breath.   Cardiovascular: Negative for chest pain.  Gastrointestinal: Negative for abdominal pain, diarrhea, nausea and vomiting.  Skin: Negative for rash.  Neurological: Positive for weakness. Negative for headaches.  Psychiatric/Behavioral: Negative for confusion. The patient is nervous/anxious.     Vital Signs: BP 139/83 (BP Location: Left Arm)   Pulse 81   Temp 98.6 F (37 C) (Oral)   Resp 16   SpO2 97%   Physical Exam Vitals signs reviewed.  Constitutional:      General: She is not in acute distress.    Appearance: Normal appearance.     Comments: Caregiver at bedside during exam  HENT:     Head: Normocephalic and atraumatic.     Ears:     Comments: Hard of hearing Cardiovascular:     Rate and Rhythm: Normal rate and regular rhythm.  Pulmonary:     Effort: Pulmonary effort is normal.     Breath sounds: Normal breath sounds.  Abdominal:     General: There is no distension.     Palpations: Abdomen is  soft.     Tenderness: There is no abdominal tenderness.  Skin:    General: Skin is warm and dry.  Neurological:     Mental Status: She is alert and oriented  to person, place, and time.  Psychiatric:        Mood and Affect: Mood normal.        Behavior: Behavior normal.        Thought Content: Thought content normal.        Judgment: Judgment normal.      MD Evaluation Airway: WNL Heart: WNL Abdomen: WNL Chest/ Lungs: WNL ASA  Classification: 3 Mallampati/Airway Score: One   Imaging: Nm Pet Image Initial (pi) Skull Base To Thigh  Result Date: 08/12/2018 CLINICAL DATA:  Initial treatment strategy for anal cancer. EXAM: NUCLEAR MEDICINE PET SKULL BASE TO THIGH TECHNIQUE: 7.6 mCi F-18 FDG was injected intravenously. Full-ring PET imaging was performed from the skull base to thigh after the radiotracer. CT data was obtained and used for attenuation correction and anatomic localization. Fasting blood glucose: 94 mg/dl COMPARISON:  None. FINDINGS: Mediastinal blood pool activity: SUV max 2.2 NECK: No hypermetabolic lymph nodes in the neck. Incidental CT findings: none CHEST: Hypermetabolic calcified left-sided pleural plaques evident. Patient had a chest CT 09/06/2014 documenting no interval change in the calcified pleural plaques. No calcified right pleural plaques. Incidental CT findings: Tiny left thyroid nodules, similar to prior. Atherosclerotic calcification is noted in the wall of the thoracic aorta. Coronary artery calcification is evident. ABDOMEN/PELVIS: No abnormal hypermetabolic activity within the liver, pancreas, adrenal glands, or spleen. No hypermetabolic lymph nodes in the abdomen. 2.0 cm short axis right groin lymph node is hypermetabolic with SUV max = 15. Hypermetabolism identified right anal wall with SUV max = 8.6. Incidental CT findings: Stable cyst anterior right liver. There is abdominal aortic atherosclerosis without aneurysm. Diverticular changes noted left colon. 3.9  x 2.3 cm left adnexal cyst was 3.6 x 2.2 cm on 11/14/2016. No older comparative imaging available. No hypermetabolism associated with this lesion. Left groin hernia contains only fat. SKELETON: No focal hypermetabolic activity to suggest skeletal metastasis. Incidental CT findings: No worrisome lytic or sclerotic osseous abnormality. IMPRESSION: 1. Focal hypermetabolism in the right anus consistent with known primary tumor. 2. Hypermetabolic right inguinal metastatic lymph node. No other metastatic disease identified in the neck, chest, abdomen, or pelvis. 3. Hypermetabolic calcified left pleural plaques. These plaques were visualized on CT chest of 09/06/2014 and are unchanged in the interval. No right hemithoracic pleural plaques. Given unilateral disease, prior hemothorax or empyema would be a consideration. 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 4. 3.9 x 2.3 cm left adnexal cyst without hypermetabolic activity. Attention on follow-up recommended. 5.  Aortic Atherosclerois (ICD10-170.0) Electronically Signed   By: Misty Stanley M.D.   On: 08/12/2018 11:34    Labs:  CBC: Recent Labs    05/01/18 1430 07/20/18 1519 08/03/18 1048 08/21/18 1201  WBC 19.8* 27.8* 27.9* 23.4*  HGB 13.3 14.8 13.9 14.1  HCT 40.0 46.8* 41.5 43.0  PLT 213 220 207 207    COAGS: Recent Labs    09/03/17 0754 07/20/18 1519 08/21/18 1201  INR  --  1.04 0.93  APTT 24 25  --     BMP: Recent Labs    04/07/18 1053 05/01/18 1430 07/20/18 1519 08/21/18 1201  NA 144 143 139 141  K 4.3 3.8 3.8 3.8  CL 108 108 108 109  CO2 26 25 24  21*  GLUCOSE 89 99 134* 100*  BUN 11 12 12 10   CALCIUM 9.0 9.6 9.3 8.7*  CREATININE 0.95 0.91 1.05* 0.71  GFRNONAA 55* 58* 49* >60  GFRAA >60 >60 57* >60  LIVER FUNCTION TESTS: Recent Labs    09/01/17 0908 10/24/17 1241 11/11/17 05/01/18 1430 07/20/18 1519  BILITOT 0.6 0.3  --  0.2* 0.3  AST 28 14 16 15 22   ALT 16 15 13 17 17   ALKPHOS 58 67  65 84 56  PROT 6.5 6.4  --  6.6 6.6  ALBUMIN 3.6 3.4*  --  3.5 3.8    TUMOR MARKERS: No results for input(s): AFPTM, CEA, CA199, CHROMGRNA in the last 8760 hours.  Assessment and Plan:  Patient with history of CLL, recently diagnosed anal cancer s/p surgical resection 07/2018 followed by Dr. Burr Medico. PET scan performed 08/12/18 shows right inguinal hypermetabolic inguinal lymph node - request has been made for biopsy of this lymph node for further evaluation.  Patient has been NPO since 6 am, she does not take blood thinning medications. WBC 23.4, hgb 14.1, plt 207, INR 0.93.  Risks and benefits discussed with the patient including, but not limited to bleeding, infection, damage to adjacent structures or low yield requiring additional tests.  All of the patient's questions were answered, patient is agreeable to proceed.  Consent signed and in chart.  Thank you for this interesting consult.  I greatly enjoyed meeting Bethany Oconnor and look forward to participating in their care.  A copy of this report was sent to the requesting provider on this date.  Electronically Signed: Joaquim Nam, PA-C 08/21/2018, 12:48 PM   I spent a total of 30 Minutes  in face to face in clinical consultation, greater than 50% of which was counseling/coordinating care for inguinal lymph node biopsy.

## 2018-08-21 NOTE — Discharge Instructions (Signed)
Moderate Conscious Sedation, Adult, Care After °These instructions provide you with information about caring for yourself after your procedure. Your health care provider may also give you more specific instructions. Your treatment has been planned according to current medical practices, but problems sometimes occur. Call your health care provider if you have any problems or questions after your procedure. °What can I expect after the procedure? °After your procedure, it is common: °· To feel sleepy for several hours. °· To feel clumsy and have poor balance for several hours. °· To have poor judgment for several hours. °· To vomit if you eat too soon. °Follow these instructions at home: °For at least 24 hours after the procedure: ° °· Do not: °? Participate in activities where you could fall or become injured. °? Drive. °? Use heavy machinery. °? Drink alcohol. °? Take sleeping pills or medicines that cause drowsiness. °? Make important decisions or sign legal documents. °? Take care of children on your own. °· Rest. °Eating and drinking °· Follow the diet recommended by your health care provider. °· If you vomit: °? Drink water, juice, or soup when you can drink without vomiting. °? Make sure you have little or no nausea before eating solid foods. °General instructions °· Have a responsible adult stay with you until you are awake and alert. °· Take over-the-counter and prescription medicines only as told by your health care provider. °· If you smoke, do not smoke without supervision. °· Keep all follow-up visits as told by your health care provider. This is important. °Contact a health care provider if: °· You keep feeling nauseous or you keep vomiting. °· You feel light-headed. °· You develop a rash. °· You have a fever. °Get help right away if: °· You have trouble breathing. °This information is not intended to replace advice given to you by your health care provider. Make sure you discuss any questions you have  with your health care provider. °Document Released: 06/09/2013 Document Revised: 01/22/2016 Document Reviewed: 12/09/2015 °Elsevier Interactive Patient Education © 2019 [employer] ° ° °Needle Biopsy, Care After °These instructions tell you how to care for yourself after your procedure. Your doctor may also give you more specific instructions. Call your doctor if you have any problems or questions. °What can I expect after the procedure? °After the procedure, it is common to have: °· Soreness. °· Bruising. °· Mild pain. °Follow these instructions at home: ° °· Return to your normal activities as told by your doctor. Ask your doctor what activities are safe for you. °· Take over-the-counter and prescription medicines only as told by your doctor. °· Wash your hands with soap and water before you change your bandage (dressing). If you cannot use soap and water, use hand sanitizer. °· Follow instructions from your doctor about: °? How to take care of your puncture site. °? When and how to change your bandage. °? When to remove your bandage.  You may remove your dressing tomorrow. °· Check your puncture site every day for signs of infection. Watch for: °? Redness, swelling, or pain. °? Fluid or blood.  °? Pus or a bad smell. °? Warmth. °· Do not take baths, swim, or use a hot tub until your doctor approves. Ask your doctor if you may take showers. You may only be allowed to take sponge baths.  You may shower tomorrow. °· Keep all follow-up visits as told by your doctor. This is important. °Contact a doctor if you have: °· A fever. °·   Redness, swelling, or pain at the puncture site, and it lasts longer than a few days. °· Fluid, blood, or pus coming from the puncture site. °· Warmth coming from the puncture site. °Get help right away if: °· You have a lot of bleeding from the puncture site. °Summary °· After the procedure, it is common to have soreness, bruising, or mild pain at the puncture site. °· Check your puncture  site every day for signs of infection, such as redness, swelling, or pain. °· Get help right away if you have severe bleeding from your puncture site. °This information is not intended to replace advice given to you by your health care provider. Make sure you discuss any questions you have with your health care provider. °Document Released: 08/01/2008 Document Revised: 09/01/2017 Document Reviewed: 09/01/2017 °Elsevier Interactive Patient Education © 2019 [employer] ° °

## 2018-08-21 NOTE — Procedures (Signed)
Anal ca, Rt inguinal adenopathy  S/p US NODE BX  NO COMP STABLE EBL MIN PATH PENDING FULL REPORT IN PACS

## 2018-08-24 NOTE — Progress Notes (Signed)
[facility]   [phone] [phone]   Clinic Follow up Note   Patient Care Team: Crist Infante, MD as PCP - General (Internal Medicine) Sherren Mocha, MD as PCP - Cardiology (Cardiology) Crist Infante, MD (Internal Medicine) Newton Pigg, MD as Consulting Physician (Obstetrics and Gynecology) Truitt Merle, MD as Consulting Physician (Hematology) Leta Baptist, MD as Consulting Physician (Otolaryngology) Sherlynn Stalls, MD as Consulting Physician (Ophthalmology) Regal, Tamala Fothergill, DPM as Consulting Physician (Podiatry) Harriett Sine, MD as Consulting Physician (Dermatology) Erroll Luna, MD as Consulting Physician (General Surgery) 08/25/2018  CHIEF COMPLAINT: F/u on anal cancer and CLL    SUMMARY OF ONCOLOGIC HISTORY: Oncology History   Cancer Staging Anal cancer [facility]) Staging form: Anus, AJCC 8th Edition - Clinical stage from 08/12/2018: Stage IIIA (cT1, cN1a, cM0) - Signed by Truitt Merle, MD on 08/25/2018 - Pathologic: No stage assigned - Unsigned       Anal cancer ([place])   07/22/2018 Surgery    Wide local excision of anal margin lesion (SCC) and hemorrhoidectomy by Dr. Dema Severin     07/22/2018 Pathology Results    Anus, biopsy, marginal lesion - INVASIVE SQUAMOUS CELL CARCINOMA, MODERATELY DIFFERENTIATED, SPANNING 0.7 CM. - HIGH GRADE ANAL INTRAEPITHELIAL NEOPLASIA (AIN-III). - INVASIVE CARCINOMA IS BROADLY LESS THAN 0.1 CM TO THE ANTERIOR MARGIN OF SPECIMEN 1.    07/22/2018 Initial Diagnosis    Anal cancer ([place])    08/12/2018 Cancer Staging    Staging form: Anus, AJCC 8th Edition - Clinical stage from 08/12/2018: Stage IIIA (cT1, cN1a, cM0) - Signed by Truitt Merle, MD on 08/25/2018    08/12/2018 Imaging    PET IMPRESSION: 1. Focal hypermetabolism in the right anus consistent with known primary tumor. 2. Hypermetabolic right inguinal metastatic lymph node. No other metastatic disease identified in the neck, chest, abdomen,  or pelvis. 3. Hypermetabolic calcified left pleural plaques. These plaques were visualized on CT chest of 09/06/2014 and are unchanged in the interval. No right hemithoracic pleural plaques. Given unilateral disease, prior hemothorax or empyema would be a consideration. 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 4. 3.9 x 2.3 cm left adnexal cyst without hypermetabolic activity. Attention on follow-up recommended. 5.  Aortic Atherosclerois (ICD10-170.0)     08/21/2018 Pathology Results    Right inguinal lymph node biopsy showed metastatic squamous cell carcinoma     CURRENT THERAPY pending    INTERVAL HISTORY: Bethany Oconnor is a 77 y.o. female who is here for follow-up. Since our last visit, she has a right inguinal LN biopsy that was positive for metastasis.   Today, she is here with her caregiver. She says that her eyes are going bad and she cannot see well. She denies abdominal pain. We called her [place]. She is angry about her biopsy results and states that she wants to join her husband in heaven. She doesn't want to be treated, but will think about her final decision and call me back. She is concerned about pain with tumor growth if she decides not to do anything and says that she cannot tolerate narcotics. Her appetite is low and she is not eating well.    Pertinent positives and negatives of review of systems are listed and detailed within the above HPI.  REVIEW OF SYSTEMS:   Constitutional: Denies fevers, chills or abnormal weight loss (+) low appetite  Eyes: (+) low visual acuity  Ears, nose, mouth, throat, and face: Denies mucositis or sore throat Respiratory: Denies cough, dyspnea or  wheezes Cardiovascular: Denies palpitation, chest discomfort or lower extremity swelling Gastrointestinal:  Denies nausea, heartburn or change in bowel habits Skin: Denies abnormal skin rashes Lymphatics: Denies new lymphadenopathy or easy  bruising Neurological:Denies numbness, tingling or new weaknesses Behavioral/Psych: Mood is stable, no new changes  All other systems were reviewed with the patient and are negative.  MEDICAL HISTORY:  Past Medical History:  Diagnosis Date  . Anxiety   . Arthritis   . Cerebral aneurysm    s/p endovascular colling 2008  . CLL (chronic lymphocytic leukemia) (HCC)    CLL  . Depression   . Hearing loss   . Macular degeneration   . Neuropathy   . Paroxysmal atrial fibrillation (HCC)   . Pneumonia    08-04-17  . PONV (postoperative nausea and vomiting)   . Thyroid disease     SURGICAL HISTORY: Past Surgical History:  Procedure Laterality Date  . ANEURYSM COILING    . CATARACT EXTRACTION     bilateral  . CERVICAL CONE BIOPSY    . COLONOSCOPY    . EVALUATION UNDER ANESTHESIA WITH HEMORRHOIDECTOMY N/A 09/03/2017   Procedure: EXAM UNDER ANESTHESIA, EXCISION OF ANAL LESION, POSSIBLE HEMORRHOIDECTOMY;  Surgeon: Ileana Roup, MD;  Location: [facility];  Service: General;  Laterality: N/A;  . LESION REMOVAL N/A 07/22/2018   Procedure: EXCISION OF PERIANAL LESION;  Surgeon: Ileana Roup, MD;  Location: [place];  Service: General;  Laterality: N/A;  . LUMBAR LAMINECTOMY/DECOMPRESSION MICRODISCECTOMY Right 04/07/2018   Procedure: Right Lumbar Four-Five Lumbar Five-Sacral One Laminectomy/Microdiscectomy;  Surgeon: Kristeen Miss, MD;  Location: [place];  Service: Neurosurgery;  Laterality: Right;  Right Lumbar Four-Five Lumbar Five-Sacral One Laminectomy/Microdiscectomy  . RECTAL EXAM UNDER ANESTHESIA N/A 07/22/2018   Procedure: ANORECTAL EXAM UNDER ANESTHESIA ERAS PATHWAY;  Surgeon: Ileana Roup, MD;  Location: [place];  Service: General;  Laterality: N/A;  . THYROIDECTOMY    . TONSILLECTOMY      I have reviewed the social history and family history with the patient and they are unchanged from previous note.  ALLERGIES:  is allergic to [employer] tartrate];  fioricet-codeine [butalbital-apap-caff-cod]; macrobid [nitrofurantoin]; codeine; hydrocodone-acetaminophen; keflex [cephalexin]; and percocet [oxycodone-acetaminophen].  MEDICATIONS:  Current Outpatient Medications  Medication Sig Dispense Refill  . acetaminophen (TYLENOL) 500 MG tablet Take 500 mg by mouth every 8 (eight) hours.     Marland Kitchen buPROPion (WELLBUTRIN XL) 300 MG 24 hr tablet Take 450 mg by mouth daily.     . Calcium Carbonate-Vitamin D 600-400 MG-UNIT per tablet Take 1 tablet by mouth daily.    . COMBIGAN 0.2-0.5 % ophthalmic solution Place 1 drop into the left eye 2 (two) times daily.     Marland Kitchen diltiazem (CARDIZEM CD) 180 MG 24 hr capsule Take 180 mg by mouth daily.    Marland Kitchen donepezil (ARICEPT) 5 MG tablet Take 5 mg by mouth at bedtime.    . gabapentin (NEURONTIN) 100 MG capsule Take 100 mg by mouth 2 (two) times daily.    . hydrocortisone (ANUSOL-HC) 25 MG suppository Place 25 mg rectally 2 (two) times daily as needed for hemorrhoids or anal itching.    Marland Kitchen ibuprofen (ADVIL,MOTRIN) 400 MG tablet Take 1 tablet (400 mg total) by mouth every 8 (eight) hours as needed for moderate pain. 60 tablet 0  . lamoTRIgine (LAMICTAL) 25 MG tablet Take 25-50 mg by mouth See admin instructions. Take 25 mg by mouth in the morning and take 50 mg by mouth in the evening    .  latanoprost (XALATAN) 0.005 % ophthalmic solution Place 1 drop into the left eye at bedtime.     . Lidocaine (ASPERCREME LIDOCAINE) 4 % PTCH Apply 1 patch topically daily as needed (for lower back / right hip pain).    . LORazepam (ATIVAN) 0.5 MG tablet Take 0.5 mg by mouth See admin instructions. Take 0.5 mg by mouth twice daily. Take 0.5 mg by mouth twice daily as needed for anxiety    . meclizine (ANTIVERT) 25 MG tablet Take 25 mg by mouth 2 (two) times daily as needed for dizziness.    . Melatonin 5 MG TABS Take 5 mg by mouth at bedtime.    . memantine (NAMENDA) 5 MG tablet Take 5 mg by mouth daily.    . mirabegron ER (MYRBETRIQ) 50 MG TB24  tablet Take 50 mg by mouth daily.    . Multiple Vitamins-Minerals (MULTIVITAMINS THER. W/MINERALS) TABS tablet Take 1 tablet by mouth daily.    . Multiple Vitamins-Minerals (PRESERVISION AREDS) CAPS Take 1 capsule by mouth daily.    Marland Kitchen omeprazole (PRILOSEC) 20 MG capsule Take 20 mg by mouth daily.    . ondansetron (ZOFRAN) 4 MG tablet Take 4 mg by mouth every 4 (four) hours as needed for nausea or vomiting.    . phenylephrine-shark liver oil-mineral oil-petrolatum (PREPARATION H) 0.25-3-14-71.9 % rectal ointment Place 1 application rectally 4 (four) times daily as needed for hemorrhoids.    . polyethylene glycol (MIRALAX / GLYCOLAX) packet Take 17 g by mouth daily as needed for moderate constipation.    . pravastatin (PRAVACHOL) 40 MG tablet Take 40 mg by mouth daily.     Marland Kitchen PREMPRO 0.625-2.5 MG tablet Take 1 tablet by mouth daily.   3  . Probiotic Product (ALIGN) 4 MG CAPS Take 4 mg by mouth daily.     Marland Kitchen trimethoprim (TRIMPEX) 100 MG tablet Take 100 mg by mouth daily.    . Wheat Dextrin (BENEFIBER PO) Take 1 each by mouth See admin instructions. Mix 1 tablespoon twice daily.    . megestrol (MEGACE ES) 625 MG/5ML suspension Take 5 mLs (625 mg total) by mouth daily. 150 mL 2   No current facility-administered medications for this visit.     PHYSICAL EXAMINATION: ECOG PERFORMANCE STATUS: 2 - Symptomatic, <50% confined to bed  Vitals:   08/25/18 0852  BP: 140/76  Pulse: 95  Resp: 18  Temp: 98.6 F (37 C)  SpO2: 97%   Filed Weights   08/25/18 0852  Weight: 153 lb 14.4 oz (69.8 kg)    GENERAL:alert, no distress and comfortable SKIN: skin color, texture, turgor are normal, no rashes or significant lesions EYES: normal, Conjunctiva are pink and non-injected, sclera clear OROPHARYNX:no exudate, no erythema and lips, buccal mucosa, and tongue normal  NECK: supple, thyroid normal size, non-tender, without nodularity LYMPH:  no palpable lymphadenopathy in the cervical, axillary or  inguinal LUNGS: clear to auscultation and percussion with normal breathing effort HEART: regular rate & rhythm and no murmurs and no lower extremity edema ABDOMEN:abdomen soft, non-tender and normal bowel sounds Musculoskeletal:no cyanosis of digits and no clubbing  NEURO: alert & oriented x 3 with fluent speech, no focal motor/sensory deficits PSHC: (+) angry and emotional about biopsy results  LABORATORY DATA:  I have reviewed the data as listed CBC Latest Ref Rng & Units 08/21/2018 08/03/2018 07/20/2018  WBC 4.0 - 10.5 K/uL 23.4(H) 27.9(H) 27.8(H)  Hemoglobin 12.0 - 15.0 g/dL 14.1 13.9 14.8  Hematocrit 36.0 - 46.0 % 43.0 41.5  46.8(H)  Platelets 150 - 400 K/uL 207 207 220     CMP Latest Ref Rng & Units 08/21/2018 07/20/2018 05/01/2018  Glucose 70 - 99 mg/dL 100(H) 134(H) 99  BUN 8 - 23 mg/dL 10 12 12   Creatinine 0.44 - 1.00 mg/dL 0.71 1.05(H) 0.91  Sodium 135 - 145 mmol/L 141 139 143  Potassium 3.5 - 5.1 mmol/L 3.8 3.8 3.8  Chloride 98 - 111 mmol/L 109 108 108  CO2 22 - 32 mmol/L 21(L) 24 25  Calcium 8.9 - 10.3 mg/dL 8.7(L) 9.3 9.6  Total Protein 6.5 - 8.1 g/dL - 6.6 6.6  Total Bilirubin 0.3 - 1.2 mg/dL - 0.3 0.2(L)  Alkaline Phos 38 - 126 U/L - 56 84  AST 15 - 41 U/L - 22 15  ALT 0 - 44 U/L - 17 17    PATHOLOGY  08/21/2018 Surgical Pathology Diagnosis Lymph node, needle/core biopsy, right inguinal adenopathy - METASTATIC SQUAMOUS CELL CARCINOMA - SEE COMMENT Microscopic Comment The neoplastic cells are positive for cytokeratin 5/6, p63 and p16. The features are consistent with metastasis from the patient's known anal squamous cell carcinoma. Dr. Burr Medico was notified of these results on August 25, 2018. Of note, the patient also has history of chronic lymphocytic leukemia. Morphologic evidence of chronic lymphocytic leukemia is not seen; additional studies can be performed upon request to rule out the presence of low grade lymphoma. Thressa Sheller MD Pathologist, Electronic  Signature (Case signed 08/25/2018) Specimen Gross and Clinical Information Specimen(s) Obtained: Lymph node, needle/core biopsy, right inguinal adenopathy Specimen Clinical Information anal Ca, PET+RT inguinal node [rd] Gross The specimen is received in saline and consists of multiple core and core fragments of tan-white soft tissue, ranging from 0.1 cm to 1.3 cm in length x 0.1 cm in diameter. Representative fragments are placed in [place] for possible flow cytometry, and the remaining tissue is entirely submitted in one cassette. (KL:ah 08/22/18) Stain(s) used in Diagnosis: The following stain(s) were used in diagnosing the case: CK 5/6, P16, P63. The control(s) stained appropriately.  RADIOGRAPHIC STUDIES: I have personally reviewed the radiological images as listed and agreed with the findings in the report. No results found.   ASSESSMENT & PLAN:  Bethany Oconnor is a 77 y.o. female with history of  1. Chroniclymphocyticleukemia (CLL), stage 0 -Diagnosed in 2015. Currently under observation and has not required any treatment  -Labs from 08/21/2018 reviewed, CBC showed WBC 23.4 Hg 14.1, plt 207K. Her ALC has been trending up, but also fluctuates, not sure if it's related to her recent anal cancer and surgery  -she has no B symptoms, no anemia or thrombocytopenia, will continue monitoring.  2. Anal Squamous Cell Carcinoma, cT1N1aM0 stage IIIA -Diagnosed in 07/2018. S/p surgical resection with closed margins. -I reviewed and discussed right inguinal LN biopsy results that were positive for metastasis. -Her staging PET scan was negative for distant metastasis. -Discussed that for locally advanced anal cancer, the standard treatment is concurrent chemoradiation, with cure rate about 70-80%.  Due to her advanced age, medical comorbidities, and recent poor performance status, I do not know if she is able to tolerate mitomycin and 5-FU.  I discussed chemo with Xeloda  and radiation. I discussed possible side effects. -I strongly encourage her to consider radiation.  I have discussed with Dr. Lisbeth Renshaw this morning, she has appointment to see Dr. Lisbeth Renshaw this morning. --She is angry about her diagnosis and the need for additional treatment. She doesn't want to be treated or  saved. She would like to die to join her husband in heaven.  She has been depressed since her husband's death. She has seen psychiatrist.  She also has dementia, and some cognitive dysfunction. -her POA says that she has expressed these wishes before today. Her POA traveled to [facility], and will try to come back to [place] on Monday.  -She wants to think about her next steps with her son and POA. They will call after Christmas and inform us with their decision. -She is worried about pain with tumor growth if she doesn't do anything, and states that she cannot tolerate narcotics. -I informed her that her cancer is treatable with chemo and radiation. -I also discussed hospice care if she decides not to be treated. -She has an appointment with Dr. Lisbeth Renshaw today.   3. HTN -f/u with PCP  4. Atrial Fibrillation -f/u with cardiology  5. Immunizations -I recommend she stays up to date with immunizations.  6. Dementia -Currently on Donepezil. Continue. -Her son is her caregiver and POA.  7. Nausea and low appetite, weight loss  -Likely related to anal cancer and surgery. -I previously advised her to use Ensure supplements. -I talked to her about starting Megestrol for her appetite, and advised her to stay physically active due to risk of DVT and blood clots with this medication. She agrees.  8. Depression -She is angry and depressed about her biopsy results. -She expressed wishes to die and join her husband in heaven. -I advised her to take her time and decide on her next steps.  Plan  -I prescribed megestrol -she will see Dr. Lisbeth Renshaw today -f/u in 2 weeks  -She will think about her cancer  treatment plan   No problem-specific Assessment & Plan notes found for this encounter.   No orders of the defined types were placed in this encounter.  All questions were answered. The patient knows to call the clinic with any problems, questions or concerns. No barriers to learning was detected. I spent 25 minutes counseling the patient face to face. The total time spent in the appointment was 30 minutes and more than 50% was on counseling and review of test results  I, Noor Dweik am acting as scribe for Dr. Truitt Merle.  I have reviewed the above documentation for accuracy and completeness, and I agree with the above.     Truitt Merle, MD 08/25/2018

## 2018-08-25 ENCOUNTER — Other Ambulatory Visit: Payer: Self-pay

## 2018-08-25 ENCOUNTER — Encounter: Payer: Self-pay | Admitting: Hematology

## 2018-08-25 ENCOUNTER — Inpatient Hospital Stay (HOSPITAL_BASED_OUTPATIENT_CLINIC_OR_DEPARTMENT_OTHER): Payer: Medicare Other | Admitting: Hematology

## 2018-08-25 ENCOUNTER — Ambulatory Visit
Admission: RE | Admit: 2018-08-25 | Discharge: 2018-08-25 | Disposition: A | Discharge status: Home / Self Care | Payer: Medicare Other | Source: Ambulatory Visit | Attending: Radiation Oncology | Admitting: Radiation Oncology

## 2018-08-25 ENCOUNTER — Encounter: Payer: Self-pay | Admitting: Radiation Oncology

## 2018-08-25 ENCOUNTER — Telehealth: Payer: Self-pay

## 2018-08-25 VITALS — BP 140/76 | HR 81 | Temp 98.1°F | Resp 20 | Ht 65.0 in | Wt 153.9 lb

## 2018-08-25 VITALS — BP 140/76 | HR 95 | Temp 98.6°F | Resp 18 | Ht 65.0 in | Wt 153.9 lb

## 2018-08-25 DIAGNOSIS — I700 Atherosclerosis of aorta: Secondary | ICD-10-CM | POA: Insufficient documentation

## 2018-08-25 DIAGNOSIS — Z87891 Personal history of nicotine dependence: Secondary | ICD-10-CM | POA: Insufficient documentation

## 2018-08-25 DIAGNOSIS — Z79899 Other long term (current) drug therapy: Secondary | ICD-10-CM | POA: Diagnosis not present

## 2018-08-25 DIAGNOSIS — C9110 Chronic lymphocytic leukemia of B-cell type not having achieved remission: Secondary | ICD-10-CM

## 2018-08-25 DIAGNOSIS — F329 Major depressive disorder, single episode, unspecified: Secondary | ICD-10-CM | POA: Insufficient documentation

## 2018-08-25 DIAGNOSIS — F0390 Unspecified dementia without behavioral disturbance: Secondary | ICD-10-CM | POA: Insufficient documentation

## 2018-08-25 DIAGNOSIS — C210 Malignant neoplasm of anus, unspecified: Secondary | ICD-10-CM

## 2018-08-25 DIAGNOSIS — I480 Paroxysmal atrial fibrillation: Secondary | ICD-10-CM | POA: Diagnosis not present

## 2018-08-25 DIAGNOSIS — M129 Arthropathy, unspecified: Secondary | ICD-10-CM | POA: Insufficient documentation

## 2018-08-25 DIAGNOSIS — G629 Polyneuropathy, unspecified: Secondary | ICD-10-CM | POA: Insufficient documentation

## 2018-08-25 DIAGNOSIS — E079 Disorder of thyroid, unspecified: Secondary | ICD-10-CM | POA: Insufficient documentation

## 2018-08-25 DIAGNOSIS — M549 Dorsalgia, unspecified: Secondary | ICD-10-CM | POA: Diagnosis not present

## 2018-08-25 DIAGNOSIS — Z809 Family history of malignant neoplasm, unspecified: Secondary | ICD-10-CM | POA: Diagnosis not present

## 2018-08-25 DIAGNOSIS — K7689 Other specified diseases of liver: Secondary | ICD-10-CM | POA: Insufficient documentation

## 2018-08-25 DIAGNOSIS — H3530 Unspecified macular degeneration: Secondary | ICD-10-CM

## 2018-08-25 DIAGNOSIS — I4891 Unspecified atrial fibrillation: Secondary | ICD-10-CM

## 2018-08-25 DIAGNOSIS — I10 Essential (primary) hypertension: Secondary | ICD-10-CM

## 2018-08-25 MED ORDER — MEGESTROL ACETATE 625 MG/5ML PO SUSP: 625.0000 mg | Freq: Every day | ORAL | 2 refills | Status: DC

## 2018-08-25 NOTE — Telephone Encounter (Signed)
Per 12/2 los patient was scheduled.

## 2018-08-25 NOTE — Progress Notes (Signed)
Radiation Oncology         [phone]) [phone] ________________________________  Name: Bethany Oconnor        MRN: [id number]  Date of Service: 08/25/2018 DOB: [birth year]  Bethany Oconnor  Bethany Oconnor     REFERRING PHYSICIAN: Truitt Merle, Oconnor   DIAGNOSIS: The encounter diagnosis was Anal cancer [facility]).   HISTORY OF PRESENT ILLNESS: Bethany Oconnor is a 77 y.o. female seen at the request of Dr. Burr Medico for a newly diagnosed anal cancer. The patient has a diagnosis of CLL since 2015 that has been in observation. She had a "lump" along her anal region that was felt to be a hemorrhoid along with rectal prolapse and rectal bleeding.  She saw Dr. Dema Severin who recommended an evaluation under anesthesia with hemorrhoidectomy.  This was performed on 09/03/2017.  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  She was followed with serial anoscopy and a few weeks ago was found to have a lesion in the perianal region concerning for disease in the prior excised anal margin.  She underwent exam under anesthesia with wide local excision, hemorrhoidectomy and anoscopy on 07/22/2018.  Her anal biopsy marginal lesion that was submitted revealed an invasive squamous cell carcinoma spanning 7 mm with high-grade AIN, the invasive carcinoma was broadly less than 1 mm to the anterior margin, and abutted the anal sphincter.  The medial margin of what was expected excised revealed high-grade AIN.  She underwent metastatic work-up including pet imaging on 08/12/2018.  This revealed hypermetabolism with an SUV of 8.6 along the right anal wall, as well as a persistent left adnexal cyst measuring six 3.9 x 2.3 cm without any hypermetabolic activity.  She also had a 2 cm right inguinal lymph node that had SUV of 15.  She subsequently underwent a core biopsy of the right inguinal lymph node on 08/21/2018.  Final pathology has revealed squamous cell carcinoma  consistent with her anal cancer.  She has met with Dr. Burr Medico, she does have other medical comorbidities which may interfere with her ability to tolerate a concurrent regimen, she comes today to discuss the options of definitive radiotherapy plus or minus chemosensitization.    PREVIOUS RADIATION THERAPY: No   PAST MEDICAL HISTORY:  Past Medical History:  Diagnosis Date  . Anxiety   . Arthritis   . Cerebral aneurysm    s/p endovascular colling 2008  . CLL (chronic lymphocytic leukemia) (HCC)    CLL  . Depression   . Hearing loss   . Macular degeneration   . Neuropathy   . Paroxysmal atrial fibrillation (HCC)   . Pneumonia    08-04-17  . PONV (postoperative nausea and vomiting)   . Thyroid disease        PAST SURGICAL HISTORY: Past Surgical History:  Procedure Laterality Date  . ANEURYSM COILING    . CATARACT EXTRACTION     bilateral  . CERVICAL CONE BIOPSY    . COLONOSCOPY    . EVALUATION UNDER ANESTHESIA WITH HEMORRHOIDECTOMY N/A 09/03/2017   Procedure: EXAM UNDER ANESTHESIA, EXCISION OF ANAL LESION, POSSIBLE HEMORRHOIDECTOMY;  Surgeon: Ileana Roup, Oconnor;  Location: [facility];  Service: General;  Laterality: N/A;  . LESION REMOVAL N/A 07/22/2018   Procedure: EXCISION OF PERIANAL LESION;  Surgeon: Ileana Roup, Oconnor;  Location: [place];  Service: General;  Laterality: N/A;  . LUMBAR LAMINECTOMY/DECOMPRESSION MICRODISCECTOMY Right 04/07/2018   Procedure: Right Lumbar Four-Five Lumbar  Five-Sacral One Laminectomy/Microdiscectomy;  Surgeon: Kristeen Miss, Oconnor;  Location: [facility];  Service: Neurosurgery;  Laterality: Right;  Right Lumbar Four-Five Lumbar Five-Sacral One Laminectomy/Microdiscectomy  . RECTAL EXAM UNDER ANESTHESIA N/A 07/22/2018   Procedure: ANORECTAL EXAM UNDER ANESTHESIA ERAS PATHWAY;  Surgeon: Ileana Roup, Oconnor;  Location: [place];  Service: General;  Laterality: N/A;  . THYROIDECTOMY    . TONSILLECTOMY       FAMILY HISTORY:  Family History  Problem  Relation Age of Onset  . Cancer Father 66       died  . Cerebral aneurysm Mother 73       died  . Heart attack Brother      SOCIAL HISTORY:  reports that she quit smoking about 29 years ago. She has never used smokeless tobacco. She reports that she does not drink alcohol or use drugs.  The patient is widowed and resides in [place].  Her son is her healthcare power of attorney.   ALLERGIES: Ambien [zolpidem tartrate]; Fioricet-codeine [butalbital-apap-caff-cod]; Macrobid [nitrofurantoin]; Codeine; Hydrocodone-acetaminophen; Keflex [cephalexin]; and Percocet [oxycodone-acetaminophen]   MEDICATIONS:  Current Outpatient Medications  Medication Sig Dispense Refill  . acetaminophen (TYLENOL) 500 MG tablet Take 500 mg by mouth every 8 (eight) hours.     Marland Kitchen buPROPion (WELLBUTRIN XL) 300 MG 24 hr tablet Take 450 mg by mouth daily.     . Calcium Carbonate-Vitamin D 600-400 MG-UNIT per tablet Take 1 tablet by mouth daily.    . COMBIGAN 0.2-0.5 % ophthalmic solution Place 1 drop into the left eye 2 (two) times daily.     Marland Kitchen diltiazem (CARDIZEM CD) 180 MG 24 hr capsule Take 180 mg by mouth daily.    Marland Kitchen donepezil (ARICEPT) 5 MG tablet Take 5 mg by mouth at bedtime.    . gabapentin (NEURONTIN) 100 MG capsule Take 100 mg by mouth 2 (two) times daily.    . hydrocortisone (ANUSOL-HC) 25 MG suppository Place 25 mg rectally 2 (two) times daily as needed for hemorrhoids or anal itching.    Marland Kitchen ibuprofen (ADVIL,MOTRIN) 400 MG tablet Take 1 tablet (400 mg total) by mouth every 8 (eight) hours as needed for moderate pain. 60 tablet 0  . lamoTRIgine (LAMICTAL) 25 MG tablet Take 25-50 mg by mouth See admin instructions. Take 25 mg by mouth in the morning and take 50 mg by mouth in the evening    . latanoprost (XALATAN) 0.005 % ophthalmic solution Place 1 drop into the left eye at bedtime.     . Lidocaine (ASPERCREME LIDOCAINE) 4 % PTCH Apply 1 patch topically daily as needed (for lower back / right hip pain).      . LORazepam (ATIVAN) 0.5 MG tablet Take 0.5 mg by mouth See admin instructions. Take 0.5 mg by mouth twice daily. Take 0.5 mg by mouth twice daily as needed for anxiety    . meclizine (ANTIVERT) 25 MG tablet Take 25 mg by mouth 2 (two) times daily as needed for dizziness.    . megestrol (MEGACE ES) 625 MG/5ML suspension Take 5 mLs (625 mg total) by mouth daily. 150 mL 2  . Melatonin 5 MG TABS Take 5 mg by mouth at bedtime.    . memantine (NAMENDA) 5 MG tablet Take 5 mg by mouth daily.    . mirabegron ER (MYRBETRIQ) 50 MG TB24 tablet Take 50 mg by mouth daily.    . Multiple Vitamins-Minerals (MULTIVITAMINS THER. W/MINERALS) TABS tablet Take 1 tablet by mouth daily.    . Multiple Vitamins-Minerals (  PRESERVISION AREDS) CAPS Take 1 capsule by mouth daily.    Marland Kitchen omeprazole (PRILOSEC) 20 MG capsule Take 20 mg by mouth daily.    . ondansetron (ZOFRAN) 4 MG tablet Take 4 mg by mouth every 4 (four) hours as needed for nausea or vomiting.    . phenylephrine-shark liver oil-mineral oil-petrolatum (PREPARATION H) 0.25-3-14-71.9 % rectal ointment Place 1 application rectally 4 (four) times daily as needed for hemorrhoids.    . polyethylene glycol (MIRALAX / GLYCOLAX) packet Take 17 g by mouth daily as needed for moderate constipation.    . pravastatin (PRAVACHOL) 40 MG tablet Take 40 mg by mouth daily.     Marland Kitchen PREMPRO 0.625-2.5 MG tablet Take 1 tablet by mouth daily.   3  . Probiotic Product (ALIGN) 4 MG CAPS Take 4 mg by mouth daily.     Marland Kitchen trimethoprim (TRIMPEX) 100 MG tablet Take 100 mg by mouth daily.    . Wheat Dextrin (BENEFIBER PO) Take 1 each by mouth See admin instructions. Mix 1 tablespoon twice daily.     No current facility-administered medications for this encounter.      REVIEW OF SYSTEMS: On review of systems, the patient reports that she is doing well overall. She denies any chest pain, shortness of breath, cough, fevers, chills, night sweats, unintended weight changes. She denies any bowel or  bladder disturbances, and denies abdominal pain, nausea or vomiting. She denies any rectal bleeding or discharge at this time. She denies any new musculoskeletal or joint aches or pains. A complete review of systems is obtained and is otherwise negative.     PHYSICAL EXAM:  Wt Readings from Last 3 Encounters:  08/25/18 153 lb 14.4 oz (69.8 kg)  08/25/18 153 lb 14.4 oz (69.8 kg)  08/13/18 155 lb 12.8 oz (70.7 kg)   Temp Readings from Last 3 Encounters:  08/25/18 98.1 F (36.7 C) (Oral)  08/25/18 98.6 F (37 C) (Oral)  08/21/18 98 F (36.7 C) (Oral)   BP Readings from Last 3 Encounters:  08/25/18 140/76  08/25/18 140/76  08/21/18 136/69   Pulse Readings from Last 3 Encounters:  08/25/18 81  08/25/18 95  08/21/18 83   Pain Assessment Pain Score: 0-No pain/10  In general this is a well appearing caucasian female in no acute distress. She is alert and oriented x4 and appropriate throughout the examination. HEENT reveals that the patient is normocephalic, atraumatic. EOMs are intact. Skin is intact without any evidence of gross lesions. Cardiopulmonary assessment is negative for acute distress and she exhibits normal effort.  Lymphatic assessment reveals palpable fullness in the right inguinal chain and not of the left. Abdomen is soft, non tender, non distended. Lower extremities are negative for pretibial pitting edema, deep calf tenderness, cyanosis or clubbing. Evaluation the perineum reveals no visible mass, and along the anal verge there is a well healed hyperemic incision site along the right aspect of the anal verge. No erythema is noted.   ECOG = 1  0 - Asymptomatic (Fully active, able to carry on all predisease activities without restriction)  1 - Symptomatic but completely ambulatory (Restricted in physically strenuous activity but ambulatory and able to carry out work of a light or sedentary nature. For example, light housework, office work)  2 - Symptomatic, <50% in bed  during the day (Ambulatory and capable of all self care but unable to carry out any work activities. Up and about more than 50% of waking hours)  3 - Symptomatic, >50% in  bed, but not bedbound (Capable of only limited self-care, confined to bed or chair 50% or more of waking hours)  4 - Bedbound (Completely disabled. Cannot carry on any self-care. Totally confined to bed or chair)  5 - Death   Eustace Pen MM, Creech RH, Tormey DC, et al. [phone]). "Toxicity and response criteria of the [facility]". [place] Oncol. 5 (6): 649-55    LABORATORY DATA:  Lab Results  Component Value Date   WBC 23.4 (H) 08/21/2018   HGB 14.1 08/21/2018   HCT 43.0 08/21/2018   MCV 101.4 (H) 08/21/2018   PLT 207 08/21/2018   Lab Results  Component Value Date   NA 141 08/21/2018   K 3.8 08/21/2018   CL 109 08/21/2018   CO2 21 (L) 08/21/2018   Lab Results  Component Value Date   ALT 17 07/20/2018   AST 22 07/20/2018   ALKPHOS 56 07/20/2018   BILITOT 0.3 07/20/2018      RADIOGRAPHY: Nm Pet Image Initial (pi) Skull Base To Thigh  Result Date: 08/12/2018 CLINICAL DATA:  Initial treatment strategy for anal cancer. EXAM: NUCLEAR MEDICINE PET SKULL BASE TO THIGH TECHNIQUE: 7.6 mCi F-18 FDG was injected intravenously. Full-ring PET imaging was performed from the skull base to thigh after the radiotracer. CT data was obtained and used for attenuation correction and anatomic localization. Fasting blood glucose: 94 mg/dl COMPARISON:  None. FINDINGS: Mediastinal blood pool activity: SUV max 2.2 NECK: No hypermetabolic lymph nodes in the neck. Incidental CT findings: none CHEST: Hypermetabolic calcified left-sided pleural plaques evident. Patient had a chest CT 09/06/2014 documenting no interval change in the calcified pleural plaques. No calcified right pleural plaques. Incidental CT findings: Tiny left thyroid nodules, similar to prior. Atherosclerotic calcification is noted in the wall of the  thoracic aorta. Coronary artery calcification is evident. ABDOMEN/PELVIS: No abnormal hypermetabolic activity within the liver, pancreas, adrenal glands, or spleen. No hypermetabolic lymph nodes in the abdomen. 2.0 cm short axis right groin lymph node is hypermetabolic with SUV max = 15. Hypermetabolism identified right anal wall with SUV max = 8.6. Incidental CT findings: Stable cyst anterior right liver. There is abdominal aortic atherosclerosis without aneurysm. Diverticular changes noted left colon. 3.9 x 2.3 cm left adnexal cyst was 3.6 x 2.2 cm on 11/14/2016. No older comparative imaging available. No hypermetabolism associated with this lesion. Left groin hernia contains only fat. SKELETON: No focal hypermetabolic activity to suggest skeletal metastasis. Incidental CT findings: No worrisome lytic or sclerotic osseous abnormality. IMPRESSION: 1. Focal hypermetabolism in the right anus consistent with known primary tumor. 2. Hypermetabolic right inguinal metastatic lymph node. No other metastatic disease identified in the neck, chest, abdomen, or pelvis. 3. Hypermetabolic calcified left pleural plaques. These plaques were visualized on CT chest of 09/06/2014 and are unchanged in the interval. No right hemithoracic pleural plaques. Given unilateral disease, prior hemothorax or empyema would be a consideration. 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 4. 3.9 x 2.3 cm left adnexal cyst without hypermetabolic activity. Attention on follow-up recommended. 5.  Aortic Atherosclerois (ICD10-170.0) Electronically Signed   By: Misty Stanley M.D.   On: 08/12/2018 11:34   [place] Core Biopsy (lymph Nodes)  Result Date: 08/21/2018 INDICATION: Anal carcinoma, PET positive right inguinal adenopathy EXAM: Ultrasound right inguinal adenopathy 18 gauge core biopsy MEDICATIONS: 1% lidocaine local ANESTHESIA/SEDATION: Moderate (conscious) sedation was employed during this procedure. A total of Versed 2.0  mg and Fentanyl 50 mcg was administered intravenously. Moderate  Sedation Time: 10 minutes. The patient's level of consciousness and vital signs were monitored continuously by radiology nursing throughout the procedure under my direct supervision. FLUOROSCOPY TIME:  Fluoroscopy Time: None. COMPLICATIONS: None immediate. PROCEDURE: Informed written consent was obtained from the patient after a thorough discussion of the procedural risks, benefits and alternatives. All questions were addressed. Maximal Sterile Barrier Technique was utilized including caps, mask, sterile gowns, sterile gloves, sterile drape, hand hygiene and skin antiseptic. A timeout was performed prior to the initiation of the procedure. Previous imaging reviewed. Preliminary ultrasound performed. The right inguinal adenopathy was localized. This was correlated with the PET-CT. Overlying skin marked. Under sterile conditions and local anesthesia, an 18 gauge core biopsy needle was advanced to the right inguinal adenopathy. 5 18 gauge core biopsies obtained. Samples placed in saline. Postprocedure imaging demonstrates no hemorrhage or hematoma. Patient tolerated biopsy well. No immediate complication. Pathology pending. IMPRESSION: Successful ultrasound right inguinal adenopathy 18 gauge core biopsy Electronically Signed   By: Jerilynn Mages.  Shick M.D.   On: 08/21/2018 13:58       IMPRESSION/PLAN: 1. 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 Dr. Lisbeth Renshaw discusses the pathology findings and reviews the nature of anal cancers and the guidelines for treatment. Dr. Lisbeth Renshaw outlined that the definitive course would include 6 weeks of daily radiotherapy with ongoing chemosensitization. He discusses alternative considerations if a patient was not interested in definitive therapy to either give radiotherapy to the anus and regional nodes without concurrent chemotherapy, versus radiotherapy to her inguinal node alone and survey the anal area with anoscopy.  After considering her options, the patient is interested in a more definitive course. The question will be to Dr. Burr Medico regarding options of systemic chemosensitization. We discussed the risks, benefits, short, and long term effects of radiotherapy, and the patient is interested in proceeding.  Dr. Lisbeth Renshaw discusses the delivery and logistics of radiotherapy and they agree on a course of 6 weeks of radiotherapy. Written consent is obtained and placed in the chart, a copy was provided to the patient. She will be contacted to simulate in the next week or two. Start date will depend on her decision making and options of systemic therapy offered by Dr. Burr Medico. 2. Comorbidities.  The patient's comorbidities including her dementia and CLL do could interfere with her ability to tolerate concurrent systemic therapy. She is to meet back with Dr. Burr Medico in a few weeks to determine how to proceed with the systemic component.   In a visit lasting 60 minutes, greater than 50% of the time was spent face to face discussing her case, and coordinating the patient's care.  The above documentation reflects my direct findings during this shared patient visit. Please see the separate note by Dr. Lisbeth Renshaw on this date for the remainder of the patient's plan of care.    Carola Rhine, PAC

## 2018-08-25 NOTE — Addendum Note (Signed)
Encounter addended by: Kyung Rudd, MD on: 08/25/2018 4:01 PM  Actions taken: Edit attestation on clinical note

## 2018-08-25 NOTE — Progress Notes (Signed)
GI Location of Tumor / Histology: Anal Cancer  Bethany Oconnor presented   PET: Focal hypermetabolism in the right anus consistent with known primary tumor.  Hypermetabolic right inguinal metastatic lymph node.  No other metastatic disease identified in the neck, chest, abdomen, or pelvis.  Biopsies of Right inguinal Lymph node 08/21/2018    Biopsies of Anus 07/22/2018   Past/Anticipated interventions by surgeon, if any:  Dr. 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 -WLE pathology- moderately differentiated SCC; 6 mm gross margins but on final path, neoplasia is identified to <0.1 cm from anterior margin. -We discussed options moving forward.  We discussed repeat excision and PET scan given the pathology findings.  We also discussed the potential for chemoradiation per Robin Searing protocol. -We discussed the option of observation with the  Increased risk of recurrence.  After considering all options, they came to the joint decision that observation is the course they would like to pursue.  I will however have them meet with Dr. Burr Medico in follow-up to re-discuss everything as they had questions about the side effects of chemo. -I will see her back in 4 weeks for wound check. - Surgical resection of anal mass with close margin.  Past/Anticipated interventions by medical oncology, if any:  Dr. Burr Medico 08/13/2018 -I discussed and reviewed her recent staging PET scan results. -I discussed that the right inguinal LN found might be enlarged due to healing from surgery, Chronic Lymphocytic Leukemia, or metastasis.  It's palpable and tender on physical exam.  No distant metastasis on the PET. -I discussed the need to biopsy the LN to r/o malignancy. -If biopsy confirm anal cancer metastasis, I would recommend radiation, with  or without adjuvant chemotherapy.  She is not a good candidate for Nigra regimen with concurrent RT due to her age and medical comorbidities.  Weight changes, if any: Lost about 10 pounds.  Poor appetite.  Bowel/Bladder complaints, if any: Having some constipation, using benefiber.  Has urinary frequency.  Nausea / Vomiting, if any: Has occasional nausea.  Pain issues, if any:  No  Any blood per rectum: No    BP 140/76 (BP Location: Right Arm, Patient Position: Sitting)   Pulse 81   Temp 98.1 F (36.7 C) (Oral)   Resp 20   Ht 5\' 5"  (1.651 m)   Wt 153 lb 14.4 oz (69.8 kg)   SpO2 97%   BMI 25.61 kg/m    Wt Readings from Last 3 Encounters:  08/25/18 153 lb 14.4 oz (69.8 kg)  08/25/18 153 lb 14.4 oz (69.8 kg)  08/13/18 155 lb 12.8 oz (70.7 kg)    SAFETY ISSUES:  Prior radiation? No  Pacemaker/ICD? No  Possible current pregnancy? No  Is the patient on methotrexate? No  Current Complaints/Details: Has bilateral hearing aids.

## 2018-08-27 ENCOUNTER — Encounter: Payer: Self-pay | Admitting: General Practice

## 2018-08-27 NOTE — Progress Notes (Signed)
[facility] Psychosocial Distress Screening Clinical Social Work  Clinical Social Work was referred by distress screening protocol.  The patient scored a 8 on the Psychosocial Distress Thermometer which indicates moderate distress. CSW reviewed chart, noted diagnosis of dementia and current residence in [facility].  Patient's concerns re housing and food are presumably addressed by current living situation.  Appears to have adequate treatment for issues related to dementia and medical needs.  CSW will defer to treatment team and await reconsult if specific needs arise.    ON[facility] DISTRESS SCREENING 08/25/2018  Screening Type Initial Screening  Distress experienced in past week (1-10) 8  Practical problem type Housing;Food  Emotional problem type Depression;Nervousness/Anxiety;Isolation/feeling alone;Boredom  Spiritual/Religous concerns type Loss of sense of purpose  Physical Problem type Sleep/insomnia;Loss of appetitie;Constipation/diarrhea;Tingling hands/feet;Skin dry/itchy    Clinical Social Worker follow up needed: No.  If yes, follow up plan:  Beverely Pace, [place], LCSW Clinical Social Worker Phone:  [phone]

## 2018-09-07 NOTE — Progress Notes (Signed)
[place]   Telephone:([phone]) [phone] [phone]   Clinic Follow up Note   Patient Care Team: Crist Infante, MD as PCP - General (Internal Medicine) Sherren Mocha, MD as PCP - Cardiology (Cardiology) Crist Infante, MD (Internal Medicine) Newton Pigg, MD as Consulting Physician (Obstetrics and Gynecology) Truitt Merle, MD as Consulting Physician (Hematology) Leta Baptist, MD as Consulting Physician (Otolaryngology) Sherlynn Stalls, MD as Consulting Physician (Ophthalmology) Regal, Tamala Fothergill, DPM as Consulting Physician (Podiatry) Harriett Sine, MD as Consulting Physician (Dermatology) Erroll Luna, MD as Consulting Physician (General Surgery)  Date of Service:  09/09/2018  CHIEF COMPLAINT: F/u on anal cancer   SUMMARY OF ONCOLOGIC HISTORY: Oncology History   Cancer Staging Anal cancer [facility]) Staging form: Anus, AJCC 8th Edition - Clinical stage from 08/12/2018: Stage IIIA (cT1, cN1a, cM0) - Signed by Truitt Merle, MD on 08/25/2018 - Pathologic: No stage assigned - Unsigned       Anal cancer ([place])   07/22/2018 Surgery    Wide local excision of anal margin lesion (SCC) and hemorrhoidectomy by Dr. Dema Severin     07/22/2018 Pathology Results    Anus, biopsy, marginal lesion - INVASIVE SQUAMOUS CELL CARCINOMA, MODERATELY DIFFERENTIATED, SPANNING 0.7 CM. - HIGH GRADE ANAL INTRAEPITHELIAL NEOPLASIA (AIN-III). - INVASIVE CARCINOMA IS BROADLY LESS THAN 0.1 CM TO THE ANTERIOR MARGIN OF SPECIMEN 1.    07/22/2018 Initial Diagnosis    Anal cancer ([place])    08/12/2018 Cancer Staging    Staging form: Anus, AJCC 8th Edition - Clinical stage from 08/12/2018: Stage IIIA (cT1, cN1a, cM0) - Signed by Truitt Merle, MD on 08/25/2018    08/12/2018 Imaging    PET IMPRESSION: 1. Focal hypermetabolism in the right anus consistent with known primary tumor. 2. Hypermetabolic right inguinal metastatic lymph node. No other metastatic disease identified in the neck, chest, abdomen,  or pelvis. 3. Hypermetabolic calcified left pleural plaques. These plaques were visualized on CT chest of 09/06/2014 and are unchanged in the interval. No right hemithoracic pleural plaques. Given unilateral disease, prior hemothorax or empyema would be a consideration. 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 4. 3.9 x 2.3 cm left adnexal cyst without hypermetabolic activity. Attention on follow-up recommended. 5.  Aortic Atherosclerois (ICD10-170.0)     08/21/2018 Pathology Results    Right inguinal lymph node biopsy showed metastatic squamous cell carcinoma     Radiation Therapy    Concurrnet ChemoRT with Dr. Lisbeth Renshaw starting 09/14/18     Chemotherapy    concurrent chemoRT with mitomycin and 5-FU on week 1 and 5 starting 09/14/18      CURRENT THERAPY:  PENDING Concurrent chemo radiation with mitomycin and 5-FU on week 1 and 5, with dose reduction, starting 09/14/18  INTERVAL HISTORY:  Bethany Oconnor is here for a follow up accompanied by care giver and sister-in-law. She notes her decision on wanting to go through treatment. Her Sister-in-law notes patient does not have a good outlook on life ever since her husband died. She is not thrilled about living long but also does not want to be in pain from cancer or treatment. Pt notes the loss of her husband was the worst thing in her life and since her quality of life has been lower. She notes the second worse is her recent cancer diagnosis. She was overall emotional at visit.  She is up 70% of time with walker according to care giver. She care giver notes her anal surgical incision has healed.  She notes getting 3  hours of sleep. She notes low relief with Lorazepam that she takes throughout day as needed.     REVIEW OF SYSTEMS:   Constitutional: Denies fevers, chills or abnormal weight loss (+) trouble sleeping Eyes: Denies blurriness of vision Ears, nose, mouth, throat, and face: Denies mucositis or sore  throat Respiratory: Denies cough, dyspnea or wheezes Cardiovascular: Denies palpitation, chest discomfort or lower extremity swelling Gastrointestinal:  Denies nausea, heartburn or change in bowel habits Skin: Denies abnormal skin rashes Lymphatics: Denies new lymphadenopathy or easy bruising Neurological:Denies numbness, tingling or new weaknesses Behavioral/Psych: Mood is stable, no new changes (+) Depressed, emotional  All other systems were reviewed with the patient and are negative.  MEDICAL HISTORY:  Past Medical History:  Diagnosis Date  . Anxiety   . Arthritis   . Cerebral aneurysm    s/p endovascular colling 2008  . CLL (chronic lymphocytic leukemia) (HCC)    CLL  . Depression   . Hearing loss   . Macular degeneration   . Neuropathy   . Paroxysmal atrial fibrillation (HCC)   . Pneumonia    08-04-17  . PONV (postoperative nausea and vomiting)   . Thyroid disease     SURGICAL HISTORY: Past Surgical History:  Procedure Laterality Date  . ANEURYSM COILING    . CATARACT EXTRACTION     bilateral  . CERVICAL CONE BIOPSY    . COLONOSCOPY    . EVALUATION UNDER ANESTHESIA WITH HEMORRHOIDECTOMY N/A 09/03/2017   Procedure: EXAM UNDER ANESTHESIA, EXCISION OF ANAL LESION, POSSIBLE HEMORRHOIDECTOMY;  Surgeon: Ileana Roup, MD;  Location: [facility];  Service: General;  Laterality: N/A;  . LESION REMOVAL N/A 07/22/2018   Procedure: EXCISION OF PERIANAL LESION;  Surgeon: Ileana Roup, MD;  Location: [facility];  Service: General;  Laterality: N/A;  . LUMBAR LAMINECTOMY/DECOMPRESSION MICRODISCECTOMY Right 04/07/2018   Procedure: Right Lumbar Four-Five Lumbar Five-Sacral One Laminectomy/Microdiscectomy;  Surgeon: Kristeen Miss, MD;  Location: [place];  Service: Neurosurgery;  Laterality: Right;  Right Lumbar Four-Five Lumbar Five-Sacral One Laminectomy/Microdiscectomy  . RECTAL EXAM UNDER ANESTHESIA N/A 07/22/2018   Procedure: ANORECTAL EXAM UNDER ANESTHESIA ERAS PATHWAY;  Surgeon:  Ileana Roup, MD;  Location: [place];  Service: General;  Laterality: N/A;  . THYROIDECTOMY    . TONSILLECTOMY      I have reviewed the social history and family history with the patient and they are unchanged from previous note.  ALLERGIES:  is allergic to [employer] tartrate]; fioricet-codeine [butalbital-apap-caff-cod]; macrobid [nitrofurantoin]; codeine; hydrocodone-acetaminophen; keflex [cephalexin]; and percocet [oxycodone-acetaminophen].  MEDICATIONS:  Current Outpatient Medications  Medication Sig Dispense Refill  . acetaminophen (TYLENOL) 500 MG tablet Take 500 mg by mouth every 8 (eight) hours.     Marland Kitchen buPROPion (WELLBUTRIN XL) 300 MG 24 hr tablet Take 450 mg by mouth daily.     . Calcium Carbonate-Vitamin D 600-400 MG-UNIT per tablet Take 1 tablet by mouth daily.    . COMBIGAN 0.2-0.5 % ophthalmic solution Place 1 drop into the left eye 2 (two) times daily.     Marland Kitchen diltiazem (CARDIZEM CD) 180 MG 24 hr capsule Take 180 mg by mouth daily.    Marland Kitchen donepezil (ARICEPT) 5 MG tablet Take 5 mg by mouth at bedtime.    . gabapentin (NEURONTIN) 100 MG capsule Take 100 mg by mouth 2 (two) times daily.    . hydrocortisone (ANUSOL-HC) 25 MG suppository Place 25 mg rectally 2 (two) times daily as needed for hemorrhoids or anal itching.    Marland Kitchen ibuprofen (  ADVIL,MOTRIN) 400 MG tablet Take 1 tablet (400 mg total) by mouth every 8 (eight) hours as needed for moderate pain. 60 tablet 0  . lamoTRIgine (LAMICTAL) 25 MG tablet Take 25-50 mg by mouth See admin instructions. Take 25 mg by mouth in the morning and take 50 mg by mouth in the evening    . latanoprost (XALATAN) 0.005 % ophthalmic solution Place 1 drop into the left eye at bedtime.     . Lidocaine (ASPERCREME LIDOCAINE) 4 % PTCH Apply 1 patch topically daily as needed (for lower back / right hip pain).    . LORazepam (ATIVAN) 0.5 MG tablet Take 0.5 mg by mouth See admin instructions. Take 0.5 mg by mouth twice daily. Take 0.5 mg by mouth  twice daily as needed for anxiety    . meclizine (ANTIVERT) 25 MG tablet Take 25 mg by mouth 2 (two) times daily as needed for dizziness.    . megestrol (MEGACE ES) 625 MG/5ML suspension Take 5 mLs (625 mg total) by mouth daily. 150 mL 2  . Melatonin 5 MG TABS Take 5 mg by mouth at bedtime.    . memantine (NAMENDA) 5 MG tablet Take 5 mg by mouth daily.    . mirabegron ER (MYRBETRIQ) 50 MG TB24 tablet Take 50 mg by mouth daily.    . Multiple Vitamins-Minerals (MULTIVITAMINS THER. W/MINERALS) TABS tablet Take 1 tablet by mouth daily.    . Multiple Vitamins-Minerals (PRESERVISION AREDS) CAPS Take 1 capsule by mouth daily.    Marland Kitchen omeprazole (PRILOSEC) 20 MG capsule Take 20 mg by mouth daily.    . ondansetron (ZOFRAN) 4 MG tablet Take 4 mg by mouth every 4 (four) hours as needed for nausea or vomiting.    . phenylephrine-shark liver oil-mineral oil-petrolatum (PREPARATION H) 0.25-3-14-71.9 % rectal ointment Place 1 application rectally 4 (four) times daily as needed for hemorrhoids.    . polyethylene glycol (MIRALAX / GLYCOLAX) packet Take 17 g by mouth daily as needed for moderate constipation.    . pravastatin (PRAVACHOL) 40 MG tablet Take 40 mg by mouth daily.     Marland Kitchen PREMPRO 0.625-2.5 MG tablet Take 1 tablet by mouth daily.   3  . Probiotic Product (ALIGN) 4 MG CAPS Take 4 mg by mouth daily.     Marland Kitchen trimethoprim (TRIMPEX) 100 MG tablet Take 100 mg by mouth daily.    . Wheat Dextrin (BENEFIBER PO) Take 1 each by mouth See admin instructions. Mix 1 tablespoon twice daily.     No current facility-administered medications for this visit.     PHYSICAL EXAMINATION: ECOG PERFORMANCE STATUS: 2 - Symptomatic, <50% confined to bed  Vitals:   09/09/18 1040  BP: 138/77  Pulse: 77  Resp: 18  Temp: 97.6 F (36.4 C)  SpO2: 98%   Filed Weights   09/09/18 1040  Weight: 155 lb 11.2 oz (70.6 kg)    GENERAL:alert, no distress and comfortable SKIN: skin color, texture, turgor are normal, no rashes or  significant lesions EYES: normal, Conjunctiva are pink and non-injected, sclera clear OROPHARYNX:no exudate, no erythema and lips, buccal mucosa, and tongue normal  NECK: supple, thyroid normal size, non-tender, without nodularity LYMPH:  no palpable lymphadenopathy in the cervical, axillary or inguinal LUNGS: clear to auscultation and percussion with normal breathing effort HEART: regular rate & rhythm and no murmurs and no lower extremity edema ABDOMEN:abdomen soft, non-tender and normal bowel sounds Musculoskeletal:no cyanosis of digits and no clubbing  NEURO: alert & oriented x 3 with  fluent speech, no focal motor/sensory deficits  LABORATORY DATA:  I have reviewed the data as listed CBC Latest Ref Rng & Units 09/09/2018 08/21/2018 08/03/2018  WBC 4.0 - 10.5 K/uL 32.2(H) 23.4(H) 27.9(H)  Hemoglobin 12.0 - 15.0 g/dL 14.5 14.1 13.9  Hematocrit 36.0 - 46.0 % 43.2 43.0 41.5  Platelets 150 - 400 K/uL 222 207 207     CMP Latest Ref Rng & Units 08/21/2018 07/20/2018 05/01/2018  Glucose 70 - 99 mg/dL 100(H) 134(H) 99  BUN 8 - 23 mg/dL 10 12 12   Creatinine 0.44 - 1.00 mg/dL 0.71 1.05(H) 0.91  Sodium 135 - 145 mmol/L 141 139 143  Potassium 3.5 - 5.1 mmol/L 3.8 3.8 3.8  Chloride 98 - 111 mmol/L 109 108 108  CO2 22 - 32 mmol/L 21(L) 24 25  Calcium 8.9 - 10.3 mg/dL 8.7(L) 9.3 9.6  Total Protein 6.5 - 8.1 g/dL - 6.6 6.6  Total Bilirubin 0.3 - 1.2 mg/dL - 0.3 0.2(L)  Alkaline Phos 38 - 126 U/L - 56 84  AST 15 - 41 U/L - 22 15  ALT 0 - 44 U/L - 17 17      RADIOGRAPHIC STUDIES: I have personally reviewed the radiological images as listed and agreed with the findings in the report. No results found.   ASSESSMENT & PLAN:  Bethany Oconnor is a 78 y.o. female with   1.Anal Squamous Cell Carcinoma, cT1N1aM0 stage IIIA -Diagnosed in 07/2018. S/p surgical resection with close margins. -Her staging PET scan was negative for distant metastasis, but showed hypermetabolic right inguinal lymph  node, and a biopsy confirmed squamous cell carcinoma. -I discussed treatment options with her and her caregiver and POA in details. The standard is concurrent chemoRT with mitomycin and 5-FU on week 1 and 5.  The alternative is radiation with Xeloda, she is more tolerable, but less effective.  The potential side effects of the above chemo regimen were discussed with him in great details, after strong encourage from her caregiver and power of attorney, she agreed to take 5-FU and mitomycin.  Due to her advanced age, and the fear of very poor body of life, I will reduce the dose by 20%.  ---Chemotherapy consent: Side effects including but does not limited to, fatigue, nausea, vomiting, diarrhea, hair loss, neuropathy, fluid retention, renal and kidney dysfunction, neutropenic fever, needed for blood transfusion, bleeding, coronary artery spasm, and heart failure, were discussed with patient in great detail. She agrees to proceed. -The goal of terapy is curative. -Plan to start on 09/14/18.  -F/u weekly with lab during chemoRT  2. Chroniclymphocyticleukemia (CLL), stage 0 -Diagnosed in 2015. Currently under observation and has not required any treatment  -Her ALC has been trending up, but also fluctuates, not sure if it's related to her recent anal cancer and surgery  -she has no B symptoms, no anemia or thrombocytopenia, will continue monitoring. -CBC shows WBC at 32.2, Lymphocyte at 25.1, no anemia or thrombocytopenia   3. HTN -f/u with PCP -controlled   4. Atrial Fibrillation -f/u with cardiology, stable.   5. Immunizations -Continue to stay up to date with immunizations.  6. Dementia -Currently on Donepezil. Will continue. -She has a caregiver and POA.  7. Nausea and low appetite, weight loss -Likely related to anal cancer and surgery. -I previously advised her to use Ensure supplements. -She has Megace which was previously prescribed   8. Depression, Anxiety  -She is angry  and depressed about her biopsy results. -She previously expressed wishes  to die and join her husband in heaven. -She remains angry and emotional about her condition and diagnosis.  -Continue Lorazepam as needed   Plan  PICC line by radiology on 1/13  Lab and Chemo mitomycin and 5-fu on 1/13 with dose reduction and pump d/c on 1/17 Lab and f/u with me or Lattie Haw weekly X6 on Mondays or Tuesdays starting the week of 1/20 F/u with me on 1/15 or 1/16    No problem-specific Assessment & Plan notes found for this encounter.   Orders Placed This Encounter  Procedures  . IR Fluoro Guide CV Line Right    Indicate type of CVC ordering    Standing Status:   Future    Standing Expiration Date:   11/08/2019    Order Specific Question:   Reason for exam:    Answer:   chemo    Order Specific Question:   Preferred Imaging Location?    Answer:   [facility]   All questions were answered. The patient knows to call the clinic with any problems, questions or concerns. No barriers to learning was detected. I spent 30 minutes counseling the patient face to face. The total time spent in the appointment was 40 minutes and more than 50% was on counseling and review of test results     Truitt Merle, MD 09/09/2018   I, Joslyn Devon, am acting as scribe for Truitt Merle, MD.   I have reviewed the above documentation for accuracy and completeness, and I agree with the above.

## 2018-09-08 ENCOUNTER — Ambulatory Visit
Admission: RE | Admit: 2018-09-08 | Discharge: 2018-09-08 | Disposition: A | Discharge status: Home / Self Care | Payer: Medicare Other | Source: Ambulatory Visit | Attending: Radiation Oncology | Admitting: Radiation Oncology

## 2018-09-08 DIAGNOSIS — C210 Malignant neoplasm of anus, unspecified: Secondary | ICD-10-CM | POA: Insufficient documentation

## 2018-09-08 DIAGNOSIS — Z510 Encounter for antineoplastic radiation therapy: Secondary | ICD-10-CM | POA: Insufficient documentation

## 2018-09-09 ENCOUNTER — Inpatient Hospital Stay (HOSPITAL_BASED_OUTPATIENT_CLINIC_OR_DEPARTMENT_OTHER): Payer: Medicare Other | Admitting: Hematology

## 2018-09-09 ENCOUNTER — Inpatient Hospital Stay: Payer: Medicare Other | Attending: Hematology

## 2018-09-09 ENCOUNTER — Telehealth: Payer: Self-pay | Admitting: Hematology

## 2018-09-09 ENCOUNTER — Encounter: Payer: Self-pay | Admitting: Hematology

## 2018-09-09 VITALS — BP 138/77 | HR 77 | Temp 97.6°F | Resp 18 | Ht 65.0 in | Wt 155.7 lb

## 2018-09-09 DIAGNOSIS — I4891 Unspecified atrial fibrillation: Secondary | ICD-10-CM | POA: Diagnosis not present

## 2018-09-09 DIAGNOSIS — C210 Malignant neoplasm of anus, unspecified: Secondary | ICD-10-CM

## 2018-09-09 DIAGNOSIS — I10 Essential (primary) hypertension: Secondary | ICD-10-CM | POA: Insufficient documentation

## 2018-09-09 DIAGNOSIS — C9110 Chronic lymphocytic leukemia of B-cell type not having achieved remission: Secondary | ICD-10-CM

## 2018-09-09 DIAGNOSIS — Z79899 Other long term (current) drug therapy: Secondary | ICD-10-CM | POA: Diagnosis not present

## 2018-09-09 DIAGNOSIS — R634 Abnormal weight loss: Secondary | ICD-10-CM | POA: Diagnosis not present

## 2018-09-09 DIAGNOSIS — R110 Nausea: Secondary | ICD-10-CM | POA: Diagnosis not present

## 2018-09-09 DIAGNOSIS — E079 Disorder of thyroid, unspecified: Secondary | ICD-10-CM | POA: Insufficient documentation

## 2018-09-09 LAB — CBC WITH DIFFERENTIAL/PLATELET
Abs Immature Granulocytes: 0.13 10*3/uL — ABNORMAL HIGH (ref 0.00–0.07)
BASOS ABS: 0.1 10*3/uL (ref 0.0–0.1)
Basophils Relative: 0 %
Eosinophils Absolute: 0.1 10*3/uL (ref 0.0–0.5)
Eosinophils Relative: 0 %
HCT: 43.2 % (ref 36.0–46.0)
Hemoglobin: 14.5 g/dL (ref 12.0–15.0)
Immature Granulocytes: 0 %
Lymphocytes Relative: 79 %
Lymphs Abs: 25.1 10*3/uL — ABNORMAL HIGH (ref 0.7–4.0)
MCH: 33 pg (ref 26.0–34.0)
MCHC: 33.6 g/dL (ref 30.0–36.0)
MCV: 98.4 fL (ref 80.0–100.0)
Monocytes Absolute: 0.9 10*3/uL (ref 0.1–1.0)
Monocytes Relative: 3 %
NRBC: 0 % (ref 0.0–0.2)
Neutro Abs: 5.9 10*3/uL (ref 1.7–7.7)
Neutrophils Relative %: 18 %
PLATELETS: 222 10*3/uL (ref 150–400)
RBC: 4.39 MIL/uL (ref 3.87–5.11)
RDW: 14.5 % (ref 11.5–15.5)
WBC: 32.2 10*3/uL — ABNORMAL HIGH (ref 4.0–10.5)

## 2018-09-09 MED ORDER — ONDANSETRON HCL 8 MG PO TABS: 8.0000 mg | ORAL_TABLET | Freq: Two times a day (BID) | ORAL | 1 refills | Status: DC | PRN

## 2018-09-09 MED ORDER — PROCHLORPERAZINE MALEATE 10 MG PO TABS: 10.0000 mg | ORAL_TABLET | Freq: Four times a day (QID) | ORAL | 1 refills | Status: DC | PRN

## 2018-09-09 NOTE — Progress Notes (Signed)
START ON PATHWAY REGIMEN - Anal Carcinoma     Chemotherapy concurrent with RT:     Mitomycin      5-Fluorouracil   **Always confirm dose/schedule in your pharmacy ordering system**  Patient Characteristics: Anal and Anal Margin Tumors, Newly Diagnosed - Locoregional Disease Not Amenable to Local Excision AJCC T Category: T1 AJCC N Category: N1a AJCC M Category: M0 AJCC 8 Stage Grouping: IIIA Current Disease Status: Newly Diagnosed - Locoregional Disease Not Amendable to Local Excision Intent of Therapy: Curative Intent, Discussed with Patient

## 2018-09-09 NOTE — Telephone Encounter (Signed)
Printed calendar and avs. °

## 2018-09-10 ENCOUNTER — Telehealth: Payer: Self-pay | Admitting: Hematology

## 2018-09-10 NOTE — Progress Notes (Signed)
  Radiation Oncology         [phone] ________________________________  Name: Bethany Oconnor MRN: [id number]  Date: 09/08/2018  DOB: 1941-09-01  SIMULATION AND TREATMENT PLANNING NOTE   DIAGNOSIS:     ICD-10-CM   1. Anal cancer ([place]) C21.0      CONSENT VERIFIED: yes   SET UP: Patient is set-up supine   IMMOBILIZATION: The following immobilization is used: Customized VAC lock bag. This complex treatment device will be used on a daily basis during the patient's treatment.   Diagnosis: Anal cancer   NARRATIVE: The patient was brought to the [facility]. Identity was confirmed. All relevant records and images related to the planned course of therapy were reviewed. Then, the patient was positioned in a stable reproducible clinical set-up for radiation therapy using a customized vac lock bag. Skin markings were placed. The CT images were loaded into the planning software where the target and avoidance structures were contoured.The radiation prescription was entered and confirmed.   The patient will receive 54 Gy in 30 fractions to the high-dose target region.  Daily image guidance is ordered, and this will be used on a daily basis. This is necessary to ensure accurate and precise localization of the target in addition to accurate alignment of the normal tissue structures in this region. This is particularly important given the possible motion of the high-dose target.  Treatment planning then occurred.   I have requested : Intensity Modulated Radiotherapy (IMRT) is medically necessary for this case for the following reason: Dose homogeneity; the target is in close proximity to critical normal structures, including the femoral heads, bladder, and small bowel. IMRT is thus medically to appropriately treat the patient.   Special treatment procedure  The patient will receive chemotherapy during the course of radiation treatment. The patient may experience increased or  overlapping toxicity due to this combined-modality approach and the patient will be monitored for such problems. This may include extra lab  work as necessary. This therefore constitutes a special treatment procedure.     ________________________________  Jodelle Gross, MD, PhD

## 2018-09-10 NOTE — Telephone Encounter (Signed)
Scheduled chemo edu per 1/9 sch message - patient caregiver is aware of appt date and time

## 2018-09-10 NOTE — Telephone Encounter (Signed)
Spoke with the patient caregiver about the change of the appointments and the change of the picc line appointment as well.  Patient is aware of the change of her appointments.

## 2018-09-10 NOTE — Telephone Encounter (Signed)
Called patient caregiver back, caregiver is now aware of the changes to the appointment for the radiation and that the first appointment is 01/20.  Patient caregiver also requested we change the chemo class, which was changed to a later day.

## 2018-09-11 ENCOUNTER — Telehealth: Payer: Self-pay

## 2018-09-11 ENCOUNTER — Other Ambulatory Visit: Payer: Self-pay

## 2018-09-11 ENCOUNTER — Other Ambulatory Visit (HOSPITAL_COMMUNITY): Payer: Self-pay

## 2018-09-11 DIAGNOSIS — C210 Malignant neoplasm of anus, unspecified: Secondary | ICD-10-CM | POA: Diagnosis not present

## 2018-09-11 NOTE — Progress Notes (Signed)
  Oncology Nurse Navigator Documentation   Mailed copy of new schedule to patient's home for caregiver.

## 2018-09-11 NOTE — Telephone Encounter (Signed)
  Oncology Nurse Navigator Documentation   Called and spoke with [name]- caregiver. Introduced role of GI navigator and reviewed new appointments that were changed on 09/10/18. Communicated general idea of what to expect on 1/17 for PICC placement and on 09/21/17 for chemo/pump start and rad/onc start. [name] understand that she will receive comprehensive information during chemo education class on 09/16/18.  Patient verbalized understanding that she can call me with questions or concerns. Will follow as needed.

## 2018-09-14 ENCOUNTER — Other Ambulatory Visit (HOSPITAL_COMMUNITY): Payer: Self-pay

## 2018-09-14 ENCOUNTER — Inpatient Hospital Stay: Payer: Medicare Other

## 2018-09-14 ENCOUNTER — Ambulatory Visit: Payer: Medicare Other | Admitting: Radiation Oncology

## 2018-09-15 ENCOUNTER — Ambulatory Visit: Payer: Medicare Other

## 2018-09-16 ENCOUNTER — Inpatient Hospital Stay: Payer: Medicare Other

## 2018-09-16 ENCOUNTER — Ambulatory Visit: Payer: Self-pay | Admitting: Hematology

## 2018-09-16 ENCOUNTER — Ambulatory Visit: Payer: Medicare Other

## 2018-09-17 ENCOUNTER — Ambulatory Visit: Payer: Medicare Other

## 2018-09-18 ENCOUNTER — Other Ambulatory Visit: Payer: Self-pay | Admitting: Hematology

## 2018-09-18 ENCOUNTER — Ambulatory Visit: Payer: Self-pay

## 2018-09-18 ENCOUNTER — Ambulatory Visit (HOSPITAL_COMMUNITY)
Admission: RE | Admit: 2018-09-18 | Discharge: 2018-09-18 | Disposition: A | Discharge status: Home / Self Care | Payer: Medicare Other | Source: Ambulatory Visit | Attending: Hematology | Admitting: Hematology

## 2018-09-18 ENCOUNTER — Ambulatory Visit: Payer: Medicare Other

## 2018-09-18 DIAGNOSIS — C210 Malignant neoplasm of anus, unspecified: Secondary | ICD-10-CM | POA: Diagnosis present

## 2018-09-18 MED ORDER — HEPARIN SOD (PORK) LOCK FLUSH 100 UNIT/ML IV SOLN
INTRAVENOUS | Status: AC
  Filled 2018-09-18: qty 5

## 2018-09-18 MED ORDER — HEPARIN SOD (PORK) LOCK FLUSH 100 UNIT/ML IV SOLN
INTRAVENOUS | Status: AC
  Administered 2018-09-18: 500 [IU]
  Filled 2018-09-18: qty 5

## 2018-09-18 MED ORDER — LIDOCAINE HCL 1 % IJ SOLN
INTRAMUSCULAR | Status: AC
  Administered 2018-09-18: 5 mL
  Filled 2018-09-18: qty 20

## 2018-09-18 NOTE — Procedures (Signed)
Interventional Radiology Procedure:   Indications: Anal cancer  Procedure: PICC placement  Findings: Right basilic vein PICC.  Tip in lower SVC.  Length - 39 cm  Complications: None     EBL: Minimal  Plan: PICC is ready to use.     Bethany Oconnor R. Anselm Pancoast, MD  Pager: [phone]

## 2018-09-18 NOTE — Discharge Instructions (Signed)
PICC Insertion, Care After    This sheet gives you information about how to care for yourself after your procedure. Your health care provider may also give you more specific instructions. If you have problems or questions, contact your health care provider.  What can I expect after the procedure?  After your procedure, it is common to have mild discomfort at the insertion site.  Follow these instructions at home:  Activity   Rest as told by your health care provider. Ask your health care provider when you can return to your normal activities.   If your PICC is near or at the bend of your elbow, avoid activity with repeated motion at the elbow.   Do not lift anything that is heavier than 10 lb (4.5 kg), or the limit that you are told, until your health care provider says that it is safe.   Avoid using a crutch with the arm on the same side as your PICC. You may need to use a walker.   Avoid any activity that requires great effort as told by your health care provider.  Bathing   Do not take baths, swim, or use a hot tub until your health care provider approves. Ask your health care provider if you can take showers. You may only be allowed to take sponge baths for bathing.   Keep the bandage (dressing) dry until your health care provider says it can be removed.  General instructions   Do not drive for 24 hours if you were given a medicine to help you relax (sedative).   Take over-the-counter and prescription medicines only as told by your health care provider.   Keep all follow-up visits as told by your health care provider. This is important.  Contact a health care provider if:   You have a fever or chills.   You have more redness, swelling, or pain around the insertion site.   You have fluid or blood coming from the insertion site.   Your insertion site feels warm to the touch.   You have pus or a bad smell coming from the insertion site.   Your vein feels hard and raised like a "cord" under the skin  around the insertion site.  Get help right away if:   You have swelling in the arm in which the PICC was inserted.   You have numbness or tingling in your fingers, hand, or arm.   You have a red streak going up your arm from where the PICC was inserted.   Your PICC cannot be flushed, is hard to flush, or leaks around the insertion site when flushed.   You have pain in your arm, ear, face, or teeth.   Your PICC is accidentally pulled all the way out. If this happens, cover the insertion site with a bandage or gauze dressing. Do not throw the PICC away. Your health care provider will need to check it.   Your PICC was tugged or pulled and has partially come out. Do not push the PICC back in.   You hear a "flushing" sound when the PICC is flushed.   You feel your heart racing or skipping beats.   There is a hole or tear in the PICC.   You have pain in your chest.   You have shortness of breath or difficulty breathing.  Summary   After your procedure, it is common to have mild discomfort at the insertion site.   Do not lift anything that is   heavier than 10 lb (4.5 kg), or the limit that you are told, until your health care provider says that it is safe.   Flush the PICC as told by your health care provider. Let your health care provider know right away if the PICC is hard to flush or does not flush. Do not use force to flush the PICC.   Check your insertion site every day for signs of infection.  This information is not intended to replace advice given to you by your health care provider. Make sure you discuss any questions you have with your health care provider.  Document Released: 06/09/2013 Document Revised: 10/14/2016 Document Reviewed: 10/14/2016  Elsevier Interactive Patient Education  2019 [employer]  PICC Home Care Guide    A peripherally inserted central catheter (PICC) is a form of IV access that allows medicines and IV fluids to be quickly distributed throughout the body. The PICC is a  long, thin, flexible tube (catheter) that is inserted into a vein in the upper arm. The catheter ends in a large vein in the chest (superior vena cava, or SVC). After the PICC is inserted, a chest X-ray may be done to make sure that it is in the correct place.  A PICC may be placed for different reasons, such as:   To give medicines and liquid nutrition.   To give IV fluids and blood products.   If there is trouble placing a peripheral intravenous (PIV) catheter.  If taken care of properly, a PICC can remain in place for several months. Having a PICC can also allow a person to go home from the hospital sooner. Medicine and PICC care can be managed at home by a family member, caregiver, or home health care team.  What are the risks?  Generally, having a PICC is safe. However, problems may occur, including:   A blood clot (thrombus) forming in or at the tip of the PICC.   A blood clot forming in a vein (deep vein thrombosis) or traveling to the lung (pulmonary embolism).   Inflammation of the vein (phlebitis) in which the PICC is placed.   Infection. Central line associated blood stream infection (CLABSI) is a serious infection that often requires hospitalization.   PICC movement (malposition). The PICC tip may move from its original position due to excessive physical activity, forceful coughing, sneezing, or vomiting.   A break or cut in the PICC. It is important not to use scissors near the PICC.   Nerve or tendon irritation or injury during PICC insertion.  How to take care of your PICC  Preventing problems   You and any caregivers should wash your hands often with soap. Wash hands:  ? Before touching the PICC line or the infusion device.  ? Before changing a bandage (dressing).   Flush the PICC as told by your health care provider. Let your health care provider know right away if the PICC is hard to flush or does not flush. Do not use force to flush the PICC.   Do not use a syringe that is less than  10 mL to flush the PICC.   Avoid blood pressure checks on the arm in which the PICC is placed.   Never pull or tug on the PICC.   Do not take the PICC out yourself. Only a trained clinical professional should remove the PICC.   Use clean and sterile supplies only. Keep the supplies in a dry place. Do not reuse needles, syringes,   or any other supplies. Doing that can lead to infection.   Keep pets and children away from your PICC line.   Check the PICC insertion site every day for signs of infection. Check for:  ? Leakage.  ? Redness, swelling, or pain.  ? Fluid or blood.  ? Warmth.  ? Pus or a bad smell.  PICC dressing care   Keep your PICC bandage (dressing) clean and dry to prevent infection.   Do not take baths, swim, or use a hot tub until your health care provider approves. Ask your health care provider if you can take showers. You may only be allowed to take sponge baths for bathing. When you are allowed to shower:  ? Ask your health care provider to teach you how to wrap the PICC line.  ? Cover the PICC line with clear plastic wrap and tape to keep it dry while showering.   Follow instructions from your health care provider about how to take care of your insertion site and dressing. Make sure you:  ? Wash your hands with soap and water before you change your bandage (dressing). If soap and water are not available, use hand sanitizer.  ? Change your dressing as told by your health care provider.  ? Leave stitches (sutures), skin glue, or adhesive strips in place. These skin closures may need to stay in place for 2 weeks or longer. If adhesive strip edges start to loosen and curl up, you may trim the loose edges. Do not remove adhesive strips completely unless your health care provider tells you to do that.   Change your PICC dressing if it becomes loose or wet.  General instructions     Carry your PICC identification card or wear a medical alert bracelet at all times.   Keep the tube clamped at all  times, unless it is being used.   Carry a smooth-edge clamp with you at all times to place on the tube if it breaks.   Do not use scissors or sharp objects near the tube.   You may bend your arm and move it freely. If your PICC is near or at the bend of your elbow, avoid activity with repeated motion at the elbow.   Avoid lifting heavy objects as told by your health care provider.   Keep all follow-up visits as told by your health care provider. This is important.  Disposal of supplies   Throw away any syringes in a disposal container that is meant for sharp items (sharps container). You can buy a sharps container from a pharmacy, or you can make one by using an empty hard plastic bottle with a cover.   Place any used dressings or infusion bags into a plastic bag. Throw that bag in the trash.  Contact a health care provider if:   You have pain in your arm, ear, face, or teeth.   You have a fever or chills.   You have redness, swelling, or pain around the insertion site.   You have fluid or blood coming from the insertion site.   Your insertion site feels warm to the touch.   You have pus or a bad smell coming from the insertion site.   Your skin feels hard and raised around the insertion site.  Get help right away if:   Your PICC is accidentally pulled all the way out. If this happens, cover the insertion site with a bandage or gauze dressing. Do not throw the PICC   away. Your health care provider will need to check it.   Your PICC was tugged or pulled and has partially come out. Do not  push the PICC back in.   You cannot flush the PICC, it is hard to flush, or the PICC leaks around the insertion site when it is flushed.   You hear a "flushing" sound when the PICC is flushed.   You feel your heart racing or skipping beats.   There is a hole or tear in the PICC.   You have swelling in the arm in which the PICC was inserted.   You have a red streak going up your arm from where the PICC was  inserted.  Summary   A peripherally inserted central catheter (PICC) is a long, thin, flexible tube (catheter) that is inserted into a vein in the upper arm.   The PICC is inserted using a sterile technique by a specially trained nurse or physician. Only a trained clinical professional should remove it.   Keep your PICC identification card with you at all times.   Avoid blood pressure checks on the arm in which the PICC is placed.   If cared for properly, a PICC can remain in place for several months. Having a PICC can also allow a person to go home from the hospital sooner.  This information is not intended to replace advice given to you by your health care provider. Make sure you discuss any questions you have with your health care provider.  Document Released: 02/23/2003 Document Revised: 09/21/2016 Document Reviewed: 09/21/2016  Elsevier Interactive Patient Education  2019 [employer]

## 2018-09-20 ENCOUNTER — Other Ambulatory Visit: Payer: Self-pay | Admitting: Hematology

## 2018-09-21 ENCOUNTER — Inpatient Hospital Stay: Payer: Medicare Other

## 2018-09-21 ENCOUNTER — Other Ambulatory Visit: Payer: Self-pay

## 2018-09-21 ENCOUNTER — Inpatient Hospital Stay (HOSPITAL_BASED_OUTPATIENT_CLINIC_OR_DEPARTMENT_OTHER): Payer: Medicare Other | Admitting: Hematology

## 2018-09-21 ENCOUNTER — Ambulatory Visit
Admission: RE | Admit: 2018-09-21 | Discharge: 2018-09-21 | Disposition: A | Discharge status: Home / Self Care | Payer: Medicare Other | Source: Ambulatory Visit | Attending: Radiation Oncology | Admitting: Radiation Oncology

## 2018-09-21 ENCOUNTER — Telehealth: Payer: Self-pay | Admitting: Pharmacist

## 2018-09-21 ENCOUNTER — Encounter: Payer: Self-pay | Admitting: Hematology

## 2018-09-21 ENCOUNTER — Telehealth: Payer: Self-pay | Admitting: *Deleted

## 2018-09-21 ENCOUNTER — Telehealth: Payer: Self-pay | Admitting: Hematology

## 2018-09-21 DIAGNOSIS — C9110 Chronic lymphocytic leukemia of B-cell type not having achieved remission: Secondary | ICD-10-CM | POA: Diagnosis not present

## 2018-09-21 DIAGNOSIS — C210 Malignant neoplasm of anus, unspecified: Secondary | ICD-10-CM | POA: Diagnosis not present

## 2018-09-21 DIAGNOSIS — E079 Disorder of thyroid, unspecified: Secondary | ICD-10-CM | POA: Diagnosis not present

## 2018-09-21 DIAGNOSIS — F419 Anxiety disorder, unspecified: Secondary | ICD-10-CM

## 2018-09-21 DIAGNOSIS — I10 Essential (primary) hypertension: Secondary | ICD-10-CM

## 2018-09-21 DIAGNOSIS — I4891 Unspecified atrial fibrillation: Secondary | ICD-10-CM

## 2018-09-21 LAB — CBC WITH DIFFERENTIAL/PLATELET
Abs Immature Granulocytes: 0.12 10*3/uL — ABNORMAL HIGH (ref 0.00–0.07)
BASOS PCT: 0 %
Basophils Absolute: 0.1 10*3/uL (ref 0.0–0.1)
Eosinophils Absolute: 0.1 10*3/uL (ref 0.0–0.5)
Eosinophils Relative: 0 %
HCT: 43.6 % (ref 36.0–46.0)
Hemoglobin: 14.5 g/dL (ref 12.0–15.0)
Immature Granulocytes: 0 %
Lymphocytes Relative: 81 %
Lymphs Abs: 25.2 10*3/uL — ABNORMAL HIGH (ref 0.7–4.0)
MCH: 33.2 pg (ref 26.0–34.0)
MCHC: 33.3 g/dL (ref 30.0–36.0)
MCV: 99.8 fL (ref 80.0–100.0)
Monocytes Absolute: 0.7 10*3/uL (ref 0.1–1.0)
Monocytes Relative: 2 %
NRBC: 0 % (ref 0.0–0.2)
Neutro Abs: 5.5 10*3/uL (ref 1.7–7.7)
Neutrophils Relative %: 17 %
Platelets: 168 10*3/uL (ref 150–400)
RBC: 4.37 MIL/uL (ref 3.87–5.11)
RDW: 15 % (ref 11.5–15.5)
WBC: 31.7 10*3/uL — ABNORMAL HIGH (ref 4.0–10.5)

## 2018-09-21 LAB — CMP (CANCER CENTER ONLY)
ALT: 14 U/L (ref 0–44)
AST: 15 U/L (ref 15–41)
Albumin: 3.5 g/dL (ref 3.5–5.0)
Alkaline Phosphatase: 48 U/L (ref 38–126)
Anion gap: 9 (ref 5–15)
BUN: 14 mg/dL (ref 8–23)
CO2: 22 mmol/L (ref 22–32)
Calcium: 9.1 mg/dL (ref 8.9–10.3)
Chloride: 111 mmol/L (ref 98–111)
Creatinine: 0.93 mg/dL (ref 0.44–1.00)
GFR, Est AFR Am: >60 mL/min (ref >60)
GFR, Estimated: 58 mL/min — ABNORMAL LOW (ref >60)
Glucose, Bld: 136 mg/dL — ABNORMAL HIGH (ref 70–99)
Potassium: 3.7 mmol/L (ref 3.5–5.1)
SODIUM: 142 mmol/L (ref 135–145)
Total Bilirubin: 0.4 mg/dL (ref 0.3–1.2)
Total Protein: 6.4 g/dL — ABNORMAL LOW (ref 6.5–8.1)

## 2018-09-21 MED ORDER — CAPECITABINE 500 MG PO TABS: ORAL_TABLET | ORAL | 0 refills | Status: DC

## 2018-09-21 MED ORDER — CAPECITABINE 500 MG PO TABS: 825.0000 mg/m2 | ORAL_TABLET | Freq: Two times a day (BID) | ORAL | 1 refills | Status: DC

## 2018-09-21 MED FILL — CAPECITABINE 500 MG TABS: 500 | 40 days supply | Qty: 180 | Fill #0

## 2018-09-21 NOTE — Telephone Encounter (Signed)
Oral Oncology Pharmacist Encounter  Received new prescription for Xeloda (capecitabine) for the treatment of stage IIIA anal cancer in conjunction with radiation, planned duration 6 weeks.  Labs from 09/21/2018 assessed, OK for treatment.  Xeloda will be dosed at ~830 mg/m2 BID on days of radiation only  Current medication list in Epic reviewed, moderate DDI with pantoprazole and Xeloda identified:  Category C interaction: concurrent use of a proton pump inhibitor (PPI) was associated with poorer progression free survival and overall survival among patient receiving Xeloda as part of their adjuvant treatment of gastric cancer. A retrospective analysis of patient receiving Xeloda for adjuvant treatment of colorectal cancer did not find detrimental effects on important efficacy outcomes with concurrent use of a PPI. Patient will be screened for ability to discontinue use of PPI. If unable to be discontinued, no change to current therapy is indicated at this time.  Patient will be counseled to discontinue use of multivitamin until completion of Xeloda if it contains folic acid.  Prescription has been e-scribed to the [facility] for benefits analysis and approval.  Oral Oncology Clinic will continue to follow for insurance authorization, copayment issues, initial counseling and start date.  Johny Drilling, PharmD, BCPS, BCOP  09/21/2018 10:17 AM Oral Oncology Clinic [phone]

## 2018-09-21 NOTE — Telephone Encounter (Signed)
No los per 01/20.

## 2018-09-21 NOTE — Patient Instructions (Signed)
PICC Removal, Adult, Care After This sheet gives you information about how to care for yourself after your procedure. Your health care provider may also give you more specific instructions. If you have problems or questions, contact your health care provider. What can I expect after the procedure? After your procedure, it is common to have:  Tenderness or soreness.  Redness, swelling, or a scab where the PICC was removed (exit site). Follow these instructions at home: For the first 24 hours after the procedure   Keep the bandage (dressing) on the exit site clean and dry. Do not remove the dressing until your health care provider tells you to do so.  Check your arm often for signs and symptoms of an infection. Check for: ? A red streak that spreads away from the dressing. ? Blood or fluid that you can see on the dressing. ? More redness or swelling.  Do not lift anything heavy or do activities that require great effort until your health care provider says it is okay. You should avoid: ? Lifting weights. ? Yard work. ? Any physical activity with repetitive arm movement.  Watch closely for any signs of an air bubble in the vein (air embolism). This is a rare but serious complication. If you have signs of air embolism, call 911 immediately and lie down on your left side to keep the air from moving into the lungs. Signs of an air embolism include: ? Difficulty breathing. ? Chest pain. ? Coughing or wheezing. ? Skin that is pale, blue, cold, or clammy. ? Rapid pulse. ? Rapid breathing. ? Fainting. After 24 Hours have passed:  Remove your dressing as told by your health care provider. Make sure you wash your hands with soap and water before and after you change the dressing. If soap and water are not available, use hand sanitizer.  Return to your normal activities as told by your health care provider.  A small scab may develop over the exit site. Do not pick at the scab.  When  bathing or showering, gently wash the exit site with soap and water. Pat it dry.  Watch for signs of infection, such as: ? Fever or chills. ? Swollen glands under the arm. ? More redness, swelling, or soreness in the arm. ? Blood, fluid, or pus coming from the exit site. ? Warmth or a bad smell at the exit site. ? A red streak spreading away from the exit site. General instructions  Take over-the-counter and prescription medicines only as told by your health care provider. Do not take any new medicines without checking with your health care provider first.  If you were prescribed an antibiotic medicine, apply or take it as told by your health care provider. Do not stop using the antibiotic even if your condition improves.  Keep all follow-up visits as told by your health care provider. This is important. Contact a health care provider if:  You have a fever or chills.  You have soreness, redness, or swelling on your exit site, and it gets worse.  You have swollen glands under your arm.  You have any of the following symptoms at your exit site: ? Blood, fluid, or pus. ? Unusual warmth. ? A bad smell. ? A red streak spreading away from the exit site. Get help right away if:  You have numbness or tingling in your fingers, hand, or arm.  Your arm looks blue and feels cold.  You have signs of an air embolism, such   A bad smell.  ? A red streak spreading away from the exit site.  Get help right away if:  · You have numbness or tingling in your fingers, hand, or arm.  · Your arm looks blue and feels cold.  · You have signs of an air embolism, such as:  ? Difficulty breathing.  ? Chest pain.  ? Coughing or wheezing.  ? Skin that is pale, blue, cold, or clammy.  ? Rapid pulse.  ? Rapid breathing.  ? Fainting.  These symptoms may represent a serious problem that is an emergency. Do not wait to see if the symptoms will go away. Get medical help right away. Call your local emergency services (911 in the [place] Do not drive yourself to the hospital.  Summary  · After your procedure, it is common to have tenderness or soreness, redness, swelling, or a scab at the exit site.  · Keep the dressing over the exit site clean and dry.  Do not remove the dressing until your health care provider tells you to do so.  · Do not lift anything heavy or do activities that require great effort until your health care provider says it is okay.  · Watch closely for any signs of an air embolism. If you have signs of air embolism, call 911 immediately and lie down on your left side.  This information is not intended to replace advice given to you by your health care provider. Make sure you discuss any questions you have with your health care provider.  Document Released: 08/24/2013 Document Revised: 10/15/2016 Document Reviewed: 10/15/2016  Elsevier Interactive Patient Education © 2019 [employer]

## 2018-09-21 NOTE — Progress Notes (Signed)
  Oncology Nurse Navigator Documentation     Met with patient and her sister-in-law in infusion room to offer support and to assess for navigation needs. None identified.

## 2018-09-21 NOTE — Telephone Encounter (Signed)
Received vm call from pt's sitter/caretaker asking about side effects of pills & if there were any changes in home care.  Returned call & spoke to Bethany Oconnor & confirmed that pt will not start mitomycin & fluorouracil but will start xeloda tonight. Confirmed that side effects very similar to fluorouracil & should still take precautions at home.  Discussed chemo handling.  Message to Jessie/oral chemo pharmacist to let her know about this pt.

## 2018-09-21 NOTE — Progress Notes (Signed)
Right UA single lumen PICC removed without difficulty per order. PICC measured 39 cm.  Site bruised from insertion date.  Occlusive dressing with vaseline gauze applied.  Pt tolerated procedure without complains.  Pt in supine position and monitored for 30 min.  Discharge instructions given to family and caregiver.

## 2018-09-21 NOTE — Telephone Encounter (Signed)
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  Radiation is scheduled 09/21/2018-10/30/2018   Counseled on administration, dosing, side effects, monitoring, drug-food interactions, safe handling, storage, and disposal.  Patient will take Xeloda 500mg  tablets, 3 tablets (1500mg ) by mouth in AM and 3 tabs (1500mg ) by mouth in PM, within 30 minutes of finishing meals, on days of radiation only.  Xeloda and radiation start date: 09/21/2018  Adverse effects of Xeloda include but are not limited to: fatigue, decreased blood counts, GI upset, diarrhea, mouth sores, and hand-foot syndrome.  Patient has anti-emetic on hand and knows to take it if nausea develops.   Patient will obtain anti diarrheal and alert the office of 4 or more loose stools above baseline.   Reviewed importance of keeping a medication schedule and plan for any missed doses.  Bethany Oconnor voiced understanding and appreciation.   All questions answered. Medication reconciliation performed and medication/allergy list updated. Bethany Oconnor informed to discontinue multivitamin until Xeloda completion due to interaction with folic acid.  They will pick-up the Xeloda from the [facility] today for copayment $0. Patient will start Xeloda this evening.  Patient knows to call the office with questions or concerns. Oral Oncology Clinic will continue to follow.  Johny Drilling, PharmD, BCPS, BCOP  09/21/2018  11:54 AM Oral Oncology Clinic [phone]

## 2018-09-21 NOTE — Telephone Encounter (Signed)
Oral Oncology Patient Advocate Encounter  Confirmed with Raymond that Xeloda was picked up on 09/21/18 with a $0 copay.   [facility] [place] Phone [phone] Fax [phone]

## 2018-09-21 NOTE — Progress Notes (Signed)
[place]   [phone] [phone]   Clinic Follow up Note   Patient Care Team: Crist Infante, MD as PCP - General (Internal Medicine) Sherren Mocha, MD as PCP - Cardiology (Cardiology) Crist Infante, MD (Internal Medicine) Newton Pigg, MD as Consulting Physician (Obstetrics and Gynecology) Truitt Merle, MD as Consulting Physician (Hematology) Leta Baptist, MD as Consulting Physician (Otolaryngology) Sherlynn Stalls, MD as Consulting Physician (Ophthalmology) Regal, Tamala Fothergill, DPM as Consulting Physician (Podiatry) Harriett Sine, MD as Consulting Physician (Dermatology) Erroll Luna, MD as Consulting Physician (General Surgery)  Date of Service:  09/21/2018  CHIEF COMPLAINT: F/u on anal cancer   SUMMARY OF ONCOLOGIC HISTORY: Oncology History   Cancer Staging Anal cancer [facility]) Staging form: Anus, AJCC 8th Edition - Clinical stage from 08/12/2018: Stage IIIA (cT1, cN1a, cM0) - Signed by Truitt Merle, MD on 08/25/2018 - Pathologic: No stage assigned - Unsigned       Anal cancer ([place])   07/22/2018 Surgery    Wide local excision of anal margin lesion (SCC) and hemorrhoidectomy by Dr. Dema Severin     07/22/2018 Pathology Results    Anus, biopsy, marginal lesion - INVASIVE SQUAMOUS CELL CARCINOMA, MODERATELY DIFFERENTIATED, SPANNING 0.7 CM. - HIGH GRADE ANAL INTRAEPITHELIAL NEOPLASIA (AIN-III). - INVASIVE CARCINOMA IS BROADLY LESS THAN 0.1 CM TO THE ANTERIOR MARGIN OF SPECIMEN 1.    07/22/2018 Initial Diagnosis    Anal cancer ([place])    08/12/2018 Cancer Staging    Staging form: Anus, AJCC 8th Edition - Clinical stage from 08/12/2018: Stage IIIA (cT1, cN1a, cM0) - Signed by Truitt Merle, MD on 08/25/2018    08/12/2018 Imaging    PET IMPRESSION: 1. Focal hypermetabolism in the right anus consistent with known primary tumor. 2. Hypermetabolic right inguinal metastatic lymph node. No other metastatic disease identified in the neck, chest, abdomen,  or pelvis. 3. Hypermetabolic calcified left pleural plaques. These plaques were visualized on CT chest of 09/06/2014 and are unchanged in the interval. No right hemithoracic pleural plaques. Given unilateral disease, prior hemothorax or empyema would be a consideration. 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 4. 3.9 x 2.3 cm left adnexal cyst without hypermetabolic activity. Attention on follow-up recommended. 5.  Aortic Atherosclerois (ICD10-170.0)     08/21/2018 Pathology Results    Right inguinal lymph node biopsy showed metastatic squamous cell carcinoma     Radiation Therapy    Concurrnet ChemoRT with Dr. Lisbeth Renshaw starting 09/14/18     Chemotherapy    concurrent chemoRT with mitomycin and 5-FU on week 1 and 5 starting 09/14/18    09/14/2018 -  Chemotherapy    The patient had mitoMYcin (MUTAMYCIN) chemo injection 14.5 mg, 8 mg/m2 = 14.5 mg (100 % of original dose 8 mg/m2), Intravenous,  Once, 1 of 1 cycle Dose modification: 8 mg/m2 (original dose 8 mg/m2, Cycle 1, Reason: Provider Judgment, Comment: advanced age ) fluorouracil (ADRUCIL) 5,750 mg in sodium chloride 0.9 % 135 mL chemo infusion, 800 mg/m2/day = 5,750 mg (100 % of original dose 800 mg/m2/day), Intravenous, 4D (96 hours ), 1 of 1 cycle Dose modification: 800 mg/m2/day (original dose 800 mg/m2/day, Cycle 1, Reason: Provider Judgment)  for chemotherapy treatment.       CURRENT THERAPY:  Concurrent chemoRT with Xeloda, starting 09/21/2018   INTERVAL HISTORY:  Bethany Oconnor is here today to start concurrent chemoRT.  She is accompanied to the clinic by her caregiver [name] and power of attorney Lexine Baton. She had PICC line placed by radiology  3 days ago, and complains of severe discomfort at the PICC line site,:, Bleeding or discharge.  She has not been able to sleep well due to the PICC line.  She is not sure if she want to go through intravenous chemotherapy with radiation.  She is very afraid of  the side effects of chemotherapy and radiation.  She stated "I want to die", but also asked Lexine Baton "what does Bethany Oconnor want me to do?". Bethany Oconnor was her husband who has died a few years ago. She again reports low appetite, low energy level and poor sleep lately, no fever or chills.    REVIEW OF SYSTEMS:   Constitutional: Denies fevers, chills or abnormal weight loss (+) trouble sleeping Eyes: Denies blurriness of vision Ears, nose, mouth, throat, and face: Denies mucositis or sore throat Respiratory: Denies cough, dyspnea or wheezes Cardiovascular: Denies palpitation, chest discomfort or lower extremity swelling Gastrointestinal:  Denies nausea, heartburn or change in bowel habits Skin: Denies abnormal skin rashes Lymphatics: Denies new lymphadenopathy or easy bruising Neurological:Denies numbness, tingling or new weaknesses Behavioral/Psych: Mood is stable, no new changes (+) Depressed, emotional  All other systems were reviewed with the patient and are negative.  MEDICAL HISTORY:  Past Medical History:  Diagnosis Date  . Anxiety   . Arthritis   . Cerebral aneurysm    s/p endovascular colling 2008  . CLL (chronic lymphocytic leukemia) (HCC)    CLL  . Depression   . Hearing loss   . Macular degeneration   . Neuropathy   . Paroxysmal atrial fibrillation (HCC)   . Pneumonia    08-04-17  . PONV (postoperative nausea and vomiting)   . Thyroid disease     SURGICAL HISTORY: Past Surgical History:  Procedure Laterality Date  . ANEURYSM COILING    . CATARACT EXTRACTION     bilateral  . CERVICAL CONE BIOPSY    . COLONOSCOPY    . EVALUATION UNDER ANESTHESIA WITH HEMORRHOIDECTOMY N/A 09/03/2017   Procedure: EXAM UNDER ANESTHESIA, EXCISION OF ANAL LESION, POSSIBLE HEMORRHOIDECTOMY;  Surgeon: Ileana Roup, MD;  Location: [facility];  Service: General;  Laterality: N/A;  . LESION REMOVAL N/A 07/22/2018   Procedure: EXCISION OF PERIANAL LESION;  Surgeon: Ileana Roup, MD;  Location: [place];  Service: General;  Laterality: N/A;  . LUMBAR LAMINECTOMY/DECOMPRESSION MICRODISCECTOMY Right 04/07/2018   Procedure: Right Lumbar Four-Five Lumbar Five-Sacral One Laminectomy/Microdiscectomy;  Surgeon: Kristeen Miss, MD;  Location: [place];  Service: Neurosurgery;  Laterality: Right;  Right Lumbar Four-Five Lumbar Five-Sacral One Laminectomy/Microdiscectomy  . RECTAL EXAM UNDER ANESTHESIA N/A 07/22/2018   Procedure: ANORECTAL EXAM UNDER ANESTHESIA ERAS PATHWAY;  Surgeon: Ileana Roup, MD;  Location: [place];  Service: General;  Laterality: N/A;  . THYROIDECTOMY    . TONSILLECTOMY      I have reviewed the social history and family history with the patient and they are unchanged from previous note.  ALLERGIES:  is allergic to [employer] tartrate]; fioricet-codeine [butalbital-apap-caff-cod]; macrobid [nitrofurantoin]; codeine; hydrocodone-acetaminophen; keflex [cephalexin]; and percocet [oxycodone-acetaminophen].  MEDICATIONS:  Current Outpatient Medications  Medication Sig Dispense Refill  . acetaminophen (TYLENOL) 500 MG tablet Take 500 mg by mouth every 8 (eight) hours.     Marland Kitchen buPROPion (WELLBUTRIN XL) 300 MG 24 hr tablet Take 450 mg by mouth daily.     . Calcium Carbonate-Vitamin D 600-400 MG-UNIT per tablet Take 1 tablet by mouth daily.    . capecitabine (XELODA) 500 MG tablet Take 3 tablets (1500mg ) by mouth 2  times daily, immediately after food, take on days of radiation only, M-F 180 tablet 0  . COMBIGAN 0.2-0.5 % ophthalmic solution Place 1 drop into the left eye 2 (two) times daily.     Marland Kitchen diltiazem (CARDIZEM CD) 180 MG 24 hr capsule Take 180 mg by mouth daily.    Marland Kitchen donepezil (ARICEPT) 5 MG tablet Take 5 mg by mouth at bedtime.    . gabapentin (NEURONTIN) 100 MG capsule Take 100 mg by mouth 2 (two) times daily.    . hydrocortisone (ANUSOL-HC) 25 MG suppository Place 25 mg rectally 2 (two) times daily as needed for hemorrhoids or anal itching.    Marland Kitchen ibuprofen (ADVIL,MOTRIN)  400 MG tablet Take 1 tablet (400 mg total) by mouth every 8 (eight) hours as needed for moderate pain. 60 tablet 0  . lamoTRIgine (LAMICTAL) 25 MG tablet Take 25-50 mg by mouth See admin instructions. Take 25 mg by mouth in the morning and take 50 mg by mouth in the evening    . latanoprost (XALATAN) 0.005 % ophthalmic solution Place 1 drop into the left eye at bedtime.     . Lidocaine (ASPERCREME LIDOCAINE) 4 % PTCH Apply 1 patch topically daily as needed (for lower back / right hip pain).    . LORazepam (ATIVAN) 0.5 MG tablet Take 0.5 mg by mouth See admin instructions. Take 0.5 mg by mouth twice daily. Take 0.5 mg by mouth twice daily as needed for anxiety    . meclizine (ANTIVERT) 25 MG tablet Take 25 mg by mouth 2 (two) times daily as needed for dizziness.    . megestrol (MEGACE ES) 625 MG/5ML suspension Take 5 mLs (625 mg total) by mouth daily. 150 mL 2  . Melatonin 5 MG TABS Take 5 mg by mouth at bedtime.    . memantine (NAMENDA) 5 MG tablet Take 5 mg by mouth daily.    . mirabegron ER (MYRBETRIQ) 50 MG TB24 tablet Take 50 mg by mouth daily.    . Multiple Vitamins-Minerals (MULTIVITAMINS THER. W/MINERALS) TABS tablet Take 1 tablet by mouth daily.    . Multiple Vitamins-Minerals (PRESERVISION AREDS) CAPS Take 1 capsule by mouth daily.    Marland Kitchen omeprazole (PRILOSEC) 20 MG capsule Take 20 mg by mouth daily.    . ondansetron (ZOFRAN) 4 MG tablet Take 4 mg by mouth every 4 (four) hours as needed for nausea or vomiting.    . ondansetron (ZOFRAN) 8 MG tablet Take 1 tablet (8 mg total) by mouth 2 (two) times daily as needed (Nausea or vomiting). 30 tablet 1  . phenylephrine-shark liver oil-mineral oil-petrolatum (PREPARATION H) 0.25-3-14-71.9 % rectal ointment Place 1 application rectally 4 (four) times daily as needed for hemorrhoids.    . polyethylene glycol (MIRALAX / GLYCOLAX) packet Take 17 g by mouth daily as needed for moderate constipation.    . pravastatin (PRAVACHOL) 40 MG tablet Take 40 mg by  mouth daily.     Marland Kitchen PREMPRO 0.625-2.5 MG tablet Take 1 tablet by mouth daily.   3  . Probiotic Product (ALIGN) 4 MG CAPS Take 4 mg by mouth daily.     . prochlorperazine (COMPAZINE) 10 MG tablet Take 1 tablet (10 mg total) by mouth every 6 (six) hours as needed (Nausea or vomiting). 30 tablet 1  . trimethoprim (TRIMPEX) 100 MG tablet Take 100 mg by mouth daily.    . Wheat Dextrin (BENEFIBER PO) Take 1 each by mouth See admin instructions. Mix 1 tablespoon twice daily.  No current facility-administered medications for this visit.     PHYSICAL EXAMINATION: ECOG PERFORMANCE STATUS: 2 - Symptomatic, <50% confined to bed Blood pressure 140/71, heart rate 67, temperature 37, pulse ox 98% on room air GENERAL:alert, no distress and comfortable SKIN: skin color, texture, turgor are normal, no rashes or significant lesions EYES: normal, Conjunctiva are pink and non-injected, sclera clear OROPHARYNX:no exudate, no erythema and lips, buccal mucosa, and tongue normal  NECK: supple, thyroid normal size, non-tender, without nodularity LYMPH:  no palpable lymphadenopathy in the cervical, axillary or inguinal LUNGS: clear to auscultation and percussion with normal breathing effort HEART: regular rate & rhythm and no murmurs and no lower extremity edema ABDOMEN:abdomen soft, non-tender and normal bowel sounds Musculoskeletal:no cyanosis of digits and no clubbing  NEURO: alert & oriented x 3 with fluent speech, no focal motor/sensory deficits  LABORATORY DATA:  I have reviewed the data as listed CBC Latest Ref Rng & Units 09/21/2018 09/09/2018 08/21/2018  WBC 4.0 - 10.5 K/uL 31.7(H) 32.2(H) 23.4(H)  Hemoglobin 12.0 - 15.0 g/dL 14.5 14.5 14.1  Hematocrit 36.0 - 46.0 % 43.6 43.2 43.0  Platelets 150 - 400 K/uL 168 222 207     CMP Latest Ref Rng & Units 09/21/2018 08/21/2018 07/20/2018  Glucose 70 - 99 mg/dL 136(H) 100(H) 134(H)  BUN 8 - 23 mg/dL 14 10 12   Creatinine 0.44 - 1.00 mg/dL 0.93 0.71 1.05(H)    Sodium 135 - 145 mmol/L 142 141 139  Potassium 3.5 - 5.1 mmol/L 3.7 3.8 3.8  Chloride 98 - 111 mmol/L 111 109 108  CO2 22 - 32 mmol/L 22 21(L) 24  Calcium 8.9 - 10.3 mg/dL 9.1 8.7(L) 9.3  Total Protein 6.5 - 8.1 g/dL 6.4(L) - 6.6  Total Bilirubin 0.3 - 1.2 mg/dL 0.4 - 0.3  Alkaline Phos 38 - 126 U/L 48 - 56  AST 15 - 41 U/L 15 - 22  ALT 0 - 44 U/L 14 - 17      RADIOGRAPHIC STUDIES: I have personally reviewed the radiological images as listed and agreed with the findings in the report. No results found.   ASSESSMENT & PLAN:  Bethany Oconnor is a 78 y.o. female with   1.Anal Squamous Cell Carcinoma, cT1N1aM0 stage IIIA -Diagnosed in 07/2018. S/p surgical resection with close margins. -Her staging PET scan was negative for distant metastasis, but showed hypermetabolic right inguinal lymph node, and a biopsy confirmed squamous cell carcinoma. -I again discussed treatment options with her and her caregiver and POA in details. Pt is very concerned about severe AEs from intensive chemo (5-fu and mitomycin), due to her dementia, I spoke with her two [place] and Thayer Headings (over the phone) both in a great details. Thayer Headings reports a few episodes of pt's loss of temper, agitation due to her fear of severe side effects from chemo and radiation. After the lengthy discussion, both Lexine Baton and Thayer Headings agreed to withdraw her iv chemo (5-fu and mitomycin), and agree with oral Xeloda with concurrent radiation. Pt also expressed agreement with the plan.  They understand the cure reate with Xeloda is lower than 5-fu/mitomycin regimen, but side effects are much less.   ---Chemotherapy consent: Side effects including but does not limited to, fatigue, nausea, vomiting, diarrhea, hair loss, neuropathy, fluid retention, renal and kidney dysfunction, neutropenia and infection, needed for blood transfusion, bleeding, coronary artery spasm, and heart failure, were discussed with patient in great detail. She agrees to  proceed. -The goal of terapy is curative. -We removed  her PICC line in the clinic today  -I prescribed Xeloda for her, oral pharmacist Evelena Peat processed her prescription today, and get it ready for her to pick up at [facility] today. She will start 1500mg  bid today, M-F -continue lab and f/u weekly   2. Chroniclymphocyticleukemia (CLL), stage 0 -Diagnosed in 2015. Currently under observation and has not required any treatment  -Her ALC has been trending up, but also fluctuates, not sure if it's related to her recent anal cancer and surgery  -she has no B symptoms, no anemia or thrombocytopenia, will continue monitoring. -her CBC and ALC has been higher than before, will continue monitoring   3. HTN -f/u with PCP -controlled   4. Atrial Fibrillation -f/u with cardiology, stable.   5. Immunizations -Continue to stay up to date with immunizations.  6. Dementia -Currently on Donepezil. Will continue. -She has a caregiver and two POAs.  7. Nausea and low appetite, weight loss -Likely related to anal cancer and surgery. -f/u with dietician  -I encourage her to continue nutritional supplement   8. Depression, Anxiety  -She is angry and depressed about her biopsy results. -She previously expressed wishes to die and join her husband in heaven. -She remains angry and emotional about her condition and diagnosis.  -Continue Lorazepam as needed   Plan  PICC removed today  Will change her chemo to Xeloda 1500mg  bid with concurrent radiation, she will start today  Lab and f/u weekly    No problem-specific Assessment & Plan notes found for this encounter.   Orders Placed This Encounter  Procedures  . CMP ([facility] only)    Standing Status:   Standing    Number of Occurrences:   50    Standing Expiration Date:   09/22/2019   All questions were answered. The patient knows to call the clinic with any problems, questions or concerns. No barriers to learning was  detected. I spent 30 minutes counseling the patient face to face. The total time spent in the appointment was 40 minutes and more than 50% was on counseling and review of test results     Truitt Merle, MD 09/21/2018

## 2018-09-22 ENCOUNTER — Ambulatory Visit: Payer: Self-pay | Admitting: Hematology

## 2018-09-22 ENCOUNTER — Other Ambulatory Visit: Payer: Self-pay

## 2018-09-22 ENCOUNTER — Ambulatory Visit
Admission: RE | Admit: 2018-09-22 | Discharge: 2018-09-22 | Disposition: A | Discharge status: Home / Self Care | Payer: Medicare Other | Source: Ambulatory Visit | Attending: Radiation Oncology | Admitting: Radiation Oncology

## 2018-09-22 ENCOUNTER — Telehealth: Payer: Self-pay

## 2018-09-22 ENCOUNTER — Telehealth: Payer: Self-pay | Admitting: Hematology

## 2018-09-22 DIAGNOSIS — C210 Malignant neoplasm of anus, unspecified: Secondary | ICD-10-CM | POA: Diagnosis not present

## 2018-09-22 NOTE — Telephone Encounter (Signed)
Oral Oncology Pharmacist Encounter  Received message from [facility], RN, that she had spoken with patient's caregiver, [name], and they had additional questions and concerns about Xeloda.  I returned the call to [place]. She is a Marine scientist at [employer] that is involved in the care of Mrs. Domingo Cocking.  The facility has concerns about the management of patient's waste during the entire course of radiation. They were agreeable to the handling of patient's waste previously when they thought it only required special precautions surrounding both IV administrations of fluorouracil and mitomycin, however, are not sure they are able to abide by these precautions while she is receiving oral chemotherapy daily for 6 weeks.  They are discussing with the clinical staff at [facility] and will contact the office if they have any additional questions on the matter.  Johny Drilling, PharmD, BCPS, BCOP  09/22/2018 9:30 AM Oral Oncology Clinic [phone]

## 2018-09-22 NOTE — Telephone Encounter (Signed)
Canceled appt per 1/20 sch message - pt caregiver is aware of cancelled appt .  Will come in for radiation today and get a new printout

## 2018-09-22 NOTE — Telephone Encounter (Signed)
Per Mliss Sax at [facility] request faxed Medication Changes on 09/21/2018, sent to HIM for scanning to chart.

## 2018-09-23 ENCOUNTER — Ambulatory Visit: Payer: Self-pay | Admitting: Hematology

## 2018-09-23 ENCOUNTER — Ambulatory Visit
Admission: RE | Admit: 2018-09-23 | Discharge: 2018-09-23 | Disposition: A | Discharge status: Home / Self Care | Payer: Medicare Other | Source: Ambulatory Visit | Attending: Radiation Oncology | Admitting: Radiation Oncology

## 2018-09-23 DIAGNOSIS — C210 Malignant neoplasm of anus, unspecified: Secondary | ICD-10-CM | POA: Diagnosis not present

## 2018-09-23 NOTE — Progress Notes (Signed)
Pt here for patient teaching.  Pt given Radiation and You booklet.  Reviewed areas of pertinence such as diarrhea, fatigue, hair loss, nausea and vomiting, sexual and fertility changes, skin changes and urinary and bladder changes . Pt able to give teach back of to pat skin, use unscented/gentle soap, use baby wipes and have Imodium on hand,avoid applying anything to skin within 4 hours of treatment. Pt verbalizes understanding of information given and will contact nursing with any questions or concerns.     Caregiver present during education.  Gloriajean Dell. Leonie Green, BSN

## 2018-09-24 ENCOUNTER — Ambulatory Visit
Admission: RE | Admit: 2018-09-24 | Discharge: 2018-09-24 | Disposition: A | Discharge status: Home / Self Care | Payer: Medicare Other | Source: Ambulatory Visit | Attending: Radiation Oncology | Admitting: Radiation Oncology

## 2018-09-24 DIAGNOSIS — C210 Malignant neoplasm of anus, unspecified: Secondary | ICD-10-CM | POA: Diagnosis not present

## 2018-09-25 ENCOUNTER — Ambulatory Visit: Payer: Self-pay

## 2018-09-25 ENCOUNTER — Ambulatory Visit
Admission: RE | Admit: 2018-09-25 | Discharge: 2018-09-25 | Disposition: A | Discharge status: Home / Self Care | Payer: Medicare Other | Source: Ambulatory Visit | Attending: Radiation Oncology | Admitting: Radiation Oncology

## 2018-09-25 DIAGNOSIS — C210 Malignant neoplasm of anus, unspecified: Secondary | ICD-10-CM | POA: Diagnosis not present

## 2018-09-25 NOTE — Progress Notes (Signed)
Bethany Oconnor   [phone] Fax:([phone]) [phone]   Clinic Follow up Note   Patient Care Team: Crist Infante, MD as PCP - General (Internal Medicine) Sherren Mocha, MD as PCP - Cardiology (Cardiology) Crist Infante, MD (Internal Medicine) Newton Pigg, MD as Consulting Physician (Obstetrics and Gynecology) Truitt Merle, MD as Consulting Physician (Hematology) Leta Baptist, MD as Consulting Physician (Otolaryngology) Sherlynn Stalls, MD as Consulting Physician (Ophthalmology) Regal, Tamala Fothergill, DPM as Consulting Physician (Podiatry) Harriett Sine, MD as Consulting Physician (Dermatology) Erroll Luna, MD as Consulting Physician (General Surgery)  Date of Service:  09/28/2018  CHIEF COMPLAINT: F/u onanal cancer   SUMMARY OF ONCOLOGIC HISTORY: Oncology History   Cancer Staging Anal cancer [facility]) Staging form: Anus, AJCC 8th Edition - Clinical stage from 08/12/2018: Stage IIIA (cT1, cN1a, cM0) - Signed by Truitt Merle, MD on 08/25/2018 - Pathologic: No stage assigned - Unsigned       Anal cancer ([place])   07/22/2018 Surgery    Wide local excision of anal margin lesion (SCC) and hemorrhoidectomy by Dr. Dema Severin     07/22/2018 Pathology Results    Anus, biopsy, marginal lesion - INVASIVE SQUAMOUS CELL CARCINOMA, MODERATELY DIFFERENTIATED, SPANNING 0.7 CM. - HIGH GRADE ANAL INTRAEPITHELIAL NEOPLASIA (AIN-III). - INVASIVE CARCINOMA IS BROADLY LESS THAN 0.1 CM TO THE ANTERIOR MARGIN OF SPECIMEN 1.    07/22/2018 Initial Diagnosis    Anal cancer ([place])    08/12/2018 Cancer Staging    Staging form: Anus, AJCC 8th Edition - Clinical stage from 08/12/2018: Stage IIIA (cT1, cN1a, cM0) - Signed by Truitt Merle, MD on 08/25/2018    08/12/2018 Imaging    PET IMPRESSION: 1. Focal hypermetabolism in the right anus consistent with known primary tumor. 2. Hypermetabolic right inguinal metastatic lymph node. No other metastatic disease identified in the neck, chest, abdomen,  or pelvis. 3. Hypermetabolic calcified left pleural plaques. These plaques were visualized on CT chest of 09/06/2014 and are unchanged in the interval. No right hemithoracic pleural plaques. Given unilateral disease, prior hemothorax or empyema would be a consideration. 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 4. 3.9 x 2.3 cm left adnexal cyst without hypermetabolic activity. Attention on follow-up recommended. 5.  Aortic Atherosclerois (ICD10-170.0)     08/21/2018 Pathology Results    Right inguinal lymph node biopsy showed metastatic squamous cell carcinoma    09/21/2018 -  Chemotherapy    Concurrent chemoRT with Xeloda 1500mg  BID M-F starting 09/21/2018     09/21/2018 -  Radiation Therapy    Concurrnet ChemoRT with Dr. Lisbeth Renshaw starting 09/21/18      CURRENT THERAPY:  Concurrent chemoRT with Xeloda 1500mg  BID M-F starting 09/21/2018   INTERVAL HISTORY:  Bethany Oconnor is here for a follow up. She notes she is tolerating first week of treatment. Her care giver is her primary historian in the visit. She has had 1 bowel movement in 1 week. She had 1 episode of diarrhea 5 days ago. This resolved with imodium and has not had a bowel movement since. She will have urgency to have a bowel movement but does not always actually have a bowel movement. Taking care of her bowel movements can be difficult based on her temperament. She has denied nausea this past week. She has been able to eat 3 meals a day. She has been able to gain weight.     REVIEW OF SYSTEMS:   Constitutional: Denies fevers, chills or abnormal weight loss Eyes: Denies blurriness of vision Ears, nose,  mouth, throat, and face: Denies mucositis or sore throat Respiratory: Denies cough, dyspnea or wheezes Cardiovascular: Denies palpitation, chest discomfort or lower extremity swelling Gastrointestinal:  Denies nausea, heartburn (+) constipation/diarrhea  Skin: Denies abnormal skin rashes Lymphatics:  Denies new lymphadenopathy or easy bruising Neurological:Denies numbness, tingling or new weaknesses Behavioral/Psych: Mood is stable, no new changes  All other systems were reviewed with the patient and are negative.  MEDICAL HISTORY:  Past Medical History:  Diagnosis Date  . Anxiety   . Arthritis   . Cerebral aneurysm    s/p endovascular colling 2008  . CLL (chronic lymphocytic leukemia) (HCC)    CLL  . Depression   . Hearing loss   . Macular degeneration   . Neuropathy   . Paroxysmal atrial fibrillation (HCC)   . Pneumonia    08-04-17  . PONV (postoperative nausea and vomiting)   . Thyroid disease     SURGICAL HISTORY: Past Surgical History:  Procedure Laterality Date  . ANEURYSM COILING    . CATARACT EXTRACTION     bilateral  . CERVICAL CONE BIOPSY    . COLONOSCOPY    . EVALUATION UNDER ANESTHESIA WITH HEMORRHOIDECTOMY N/A 09/03/2017   Procedure: EXAM UNDER ANESTHESIA, EXCISION OF ANAL LESION, POSSIBLE HEMORRHOIDECTOMY;  Surgeon: Ileana Roup, MD;  Location: [facility];  Service: General;  Laterality: N/A;  . LESION REMOVAL N/A 07/22/2018   Procedure: EXCISION OF PERIANAL LESION;  Surgeon: Ileana Roup, MD;  Location: [place];  Service: General;  Laterality: N/A;  . LUMBAR LAMINECTOMY/DECOMPRESSION MICRODISCECTOMY Right 04/07/2018   Procedure: Right Lumbar Four-Five Lumbar Five-Sacral One Laminectomy/Microdiscectomy;  Surgeon: Kristeen Miss, MD;  Location: [place];  Service: Neurosurgery;  Laterality: Right;  Right Lumbar Four-Five Lumbar Five-Sacral One Laminectomy/Microdiscectomy  . RECTAL EXAM UNDER ANESTHESIA N/A 07/22/2018   Procedure: ANORECTAL EXAM UNDER ANESTHESIA ERAS PATHWAY;  Surgeon: Ileana Roup, MD;  Location: [place];  Service: General;  Laterality: N/A;  . THYROIDECTOMY    . TONSILLECTOMY      I have reviewed the social history and family history with the patient and they are unchanged from previous note.  ALLERGIES:  is allergic to [employer] tartrate]; fioricet-codeine [butalbital-apap-caff-cod]; macrobid [nitrofurantoin]; codeine; hydrocodone-acetaminophen; keflex [cephalexin]; and percocet [oxycodone-acetaminophen].  MEDICATIONS:  Current Outpatient Medications  Medication Sig Dispense Refill  . acetaminophen (TYLENOL) 500 MG tablet Take 500 mg by mouth every 8 (eight) hours.     Marland Kitchen buPROPion (WELLBUTRIN XL) 300 MG 24 hr tablet Take 450 mg by mouth daily.     . Calcium Carbonate-Vitamin D 600-400 MG-UNIT per tablet Take 1 tablet by mouth daily.    . capecitabine (XELODA) 500 MG tablet Take 3 tablets (1500mg ) by mouth 2 times daily, immediately after food, take on days of radiation only, M-F 180 tablet 0  . COMBIGAN 0.2-0.5 % ophthalmic solution Place 1 drop into the left eye 2 (two) times daily.     Marland Kitchen diltiazem (CARDIZEM CD) 180 MG 24 hr capsule Take 180 mg by mouth daily.    Marland Kitchen donepezil (ARICEPT) 5 MG tablet Take 5 mg by mouth at bedtime.    . gabapentin (NEURONTIN) 100 MG capsule Take 100 mg by mouth 2 (two) times daily.    . hydrocortisone (ANUSOL-HC) 25 MG suppository Place 25 mg rectally 2 (two) times daily as needed for hemorrhoids or anal itching.    Marland Kitchen ibuprofen (ADVIL,MOTRIN) 400 MG tablet Take 1 tablet (400 mg total) by mouth every 8 (eight) hours as needed for  moderate pain. 60 tablet 0  . lamoTRIgine (LAMICTAL) 25 MG tablet Take 25-50 mg by mouth See admin instructions. Take 25 mg by mouth in the morning and take 50 mg by mouth in the evening    . latanoprost (XALATAN) 0.005 % ophthalmic solution Place 1 drop into the left eye at bedtime.     . Lidocaine (ASPERCREME LIDOCAINE) 4 % PTCH Apply 1 patch topically daily as needed (for lower back / right hip pain).    . LORazepam (ATIVAN) 0.5 MG tablet Take 0.5 mg by mouth See admin instructions. Take 0.5 mg by mouth twice daily. Take 0.5 mg by mouth twice daily as needed for anxiety    . meclizine (ANTIVERT) 25 MG tablet Take 25 mg by mouth 2 (two) times daily as  needed for dizziness.    . megestrol (MEGACE ES) 625 MG/5ML suspension Take 5 mLs (625 mg total) by mouth daily. 150 mL 2  . Melatonin 5 MG TABS Take 5 mg by mouth at bedtime.    . memantine (NAMENDA) 5 MG tablet Take 5 mg by mouth daily.    . mirabegron ER (MYRBETRIQ) 50 MG TB24 tablet Take 50 mg by mouth daily.    . Multiple Vitamins-Minerals (MULTIVITAMINS THER. W/MINERALS) TABS tablet Take 1 tablet by mouth daily.    . Multiple Vitamins-Minerals (PRESERVISION AREDS) CAPS Take 1 capsule by mouth daily.    Marland Kitchen omeprazole (PRILOSEC) 20 MG capsule Take 20 mg by mouth daily.    . ondansetron (ZOFRAN) 4 MG tablet Take 4 mg by mouth every 4 (four) hours as needed for nausea or vomiting.    . ondansetron (ZOFRAN) 8 MG tablet Take 1 tablet (8 mg total) by mouth 2 (two) times daily as needed (Nausea or vomiting). 30 tablet 1  . phenylephrine-shark liver oil-mineral oil-petrolatum (PREPARATION H) 0.25-3-14-71.9 % rectal ointment Place 1 application rectally 4 (four) times daily as needed for hemorrhoids.    . polyethylene glycol (MIRALAX / GLYCOLAX) packet Take 17 g by mouth daily as needed for moderate constipation.    . pravastatin (PRAVACHOL) 40 MG tablet Take 40 mg by mouth daily.     Marland Kitchen PREMPRO 0.625-2.5 MG tablet Take 1 tablet by mouth daily.   3  . Probiotic Product (ALIGN) 4 MG CAPS Take 4 mg by mouth daily.     . prochlorperazine (COMPAZINE) 10 MG tablet Take 1 tablet (10 mg total) by mouth every 6 (six) hours as needed (Nausea or vomiting). 30 tablet 1  . trimethoprim (TRIMPEX) 100 MG tablet Take 100 mg by mouth daily.    . Wheat Dextrin (BENEFIBER PO) Take 1 each by mouth See admin instructions. Mix 1 tablespoon twice daily.     No current facility-administered medications for this visit.     PHYSICAL EXAMINATION: ECOG PERFORMANCE STATUS: 2 - Symptomatic, <50% confined to bed  Vitals:   09/28/18 1446  BP: 113/64  Pulse: 71  Resp: 17  Temp: 97.9 F (36.6 C)  SpO2: 98%   Filed  Weights   09/28/18 1446  Weight: 159 lb 9.6 oz (72.4 kg)    GENERAL:alert, no distress and comfortable SKIN: skin color, texture, turgor are normal, no rashes or significant lesions (+) skin ecchymosis at [facility] right upper arm from PICC line, healing well  EYES: normal, Conjunctiva are pink and non-injected, sclera clear OROPHARYNX:no exudate, no erythema and lips, buccal mucosa, and tongue normal  NECK: supple, thyroid normal size, non-tender, without nodularity LYMPH:  no palpable lymphadenopathy in the  cervical, axillary or inguinal LUNGS: clear to auscultation and percussion with normal breathing effort HEART: regular rate & rhythm and no murmurs and no lower extremity edema ABDOMEN:abdomen soft, non-tender and normal bowel sounds Musculoskeletal:no cyanosis of digits and no clubbing  NEURO: alert & oriented x 3 with fluent speech, no focal motor/sensory deficits  LABORATORY DATA:  I have reviewed the data as listed CBC Latest Ref Rng & Units 09/28/2018 09/21/2018 09/09/2018  WBC 4.0 - 10.5 K/uL 19.2(H) 31.7(H) 32.2(H)  Hemoglobin 12.0 - 15.0 g/dL 13.6 14.5 14.5  Hematocrit 36.0 - 46.0 % 41.2 43.6 43.2  Platelets 150 - 400 K/uL 175 168 222     CMP Latest Ref Rng & Units 09/28/2018 09/21/2018 08/21/2018  Glucose 70 - 99 mg/dL 147(H) 136(H) 100(H)  BUN 8 - 23 mg/dL 17 14 10   Creatinine 0.44 - 1.00 mg/dL 0.97 0.93 0.71  Sodium 135 - 145 mmol/L 140 142 141  Potassium 3.5 - 5.1 mmol/L 3.7 3.7 3.8  Chloride 98 - 111 mmol/L 112(H) 111 109  CO2 22 - 32 mmol/L 21(L) 22 21(L)  Calcium 8.9 - 10.3 mg/dL 9.0 9.1 8.7(L)  Total Protein 6.5 - 8.1 g/dL 6.3(L) 6.4(L) -  Total Bilirubin 0.3 - 1.2 mg/dL 0.5 0.4 -  Alkaline Phos 38 - 126 U/L 46 48 -  AST 15 - 41 U/L 10(L) 15 -  ALT 0 - 44 U/L 10 14 -      RADIOGRAPHIC STUDIES: I have personally reviewed the radiological images as listed and agreed with the findings in the report. No results found.   ASSESSMENT & PLAN:  Bethany Oconnor is  a 78 y.o. female with   1.Anal Squamous Cell Carcinoma, cT1N1aM0 stage IIIA -Diagnosed in 07/2018. S/p surgical resection with close margins. -Her staging PET scan was negative for distant metastasis, but showed hypermetabolic right inguinal lymph node, and a biopsy confirmed squamous cell carcinoma. --she was not a good candidate for 5-fu/mitomycin chemo with radiation. She started concurrent chemoRT with Xeloda 1500mg  BID M-F on 09/21/18. She has tolerated first week moderately well with 1 episode of diarrhea and subsequent constipation. Her caregiver will balance use of Miralax and fiber. I encouraged to drink more water. We discussed chemo precaution when her caregiver cleans her -If she has significant or intolerable side effects I have low threshold to hold her Xeloda as needed and have her complete radiation alone.  -Labs revewied, WBC at 19.2, Glucose at 147, protein at 6.3. Will monitor with chemotherapy.  -Continue Xeloda, plan for to complete 5-6 weeks. We reviewed her treatment course again with pt  -F/u in 1 week   2. Chroniclymphocyticleukemia (CLL), stage 0 -Diagnosed in 2015. Currently under observationand has not required any treatment -Her ALC has been trending up, but also fluctuates, not sure if it's related to her recent anal cancer and surgery  -she has no B symptoms, noanemia or thrombocytopenia,will continuemonitoring. -her CBC and ALC has been higher than before, will continue monitoring   3. HTN -f/u with PCP -controlled   4. Atrial Fibrillation -f/u with cardiology, stable.   5. Immunizations -Continue to stay up to date with immunizations.  6. Dementia -Currently on Donepezil. Will continue. -She has a caregiver and two POAs.  7. Nausea and low appetite, weight loss -Likely related to anal cancer and surgery. -Conitnue to f/u with dietician  -I encourage her to continue nutritional supplement  -She has denied nausea this past week. She has  been able to eat 3  meals a day. She has been able to gain weight.   8. Depression, Anxiety  -She is angry and depressed about her biopsy results. -She previously expressed wishes to die and join her husband in heaven. -She remains angry and emotional about her condition and diagnosis.  -Continue Lorazepam as needed   Plan -Continue Xeloda 1500mg  BID M-F, start week 2 today with concurrent radiation   -lab and f/u in 1 week   No problem-specific Assessment & Plan notes found for this encounter.   No orders of the defined types were placed in this encounter.  All questions were answered. The patient knows to call the clinic with any problems, questions or concerns. No barriers to learning was detected. I spent 20 minutes counseling the patient face to face. The total time spent in the appointment was 25 minutes and more than 50% was on counseling and review of test results     Truitt Merle, MD 09/28/2018   I, Joslyn Devon, am acting as scribe for Truitt Merle, MD.   I have reviewed the above documentation for accuracy and completeness, and I agree with the above.

## 2018-09-28 ENCOUNTER — Inpatient Hospital Stay: Payer: Medicare Other

## 2018-09-28 ENCOUNTER — Telehealth: Payer: Self-pay | Admitting: Hematology

## 2018-09-28 ENCOUNTER — Encounter: Payer: Self-pay | Admitting: Hematology

## 2018-09-28 ENCOUNTER — Ambulatory Visit
Admission: RE | Admit: 2018-09-28 | Discharge: 2018-09-28 | Disposition: A | Discharge status: Home / Self Care | Payer: Medicare Other | Source: Ambulatory Visit | Attending: Radiation Oncology | Admitting: Radiation Oncology

## 2018-09-28 ENCOUNTER — Inpatient Hospital Stay (HOSPITAL_BASED_OUTPATIENT_CLINIC_OR_DEPARTMENT_OTHER): Payer: Medicare Other | Admitting: Hematology

## 2018-09-28 VITALS — BP 113/64 | HR 71 | Temp 97.9°F | Resp 17 | Ht 65.0 in | Wt 159.6 lb

## 2018-09-28 DIAGNOSIS — R634 Abnormal weight loss: Secondary | ICD-10-CM

## 2018-09-28 DIAGNOSIS — E079 Disorder of thyroid, unspecified: Secondary | ICD-10-CM | POA: Diagnosis not present

## 2018-09-28 DIAGNOSIS — C210 Malignant neoplasm of anus, unspecified: Secondary | ICD-10-CM | POA: Diagnosis not present

## 2018-09-28 DIAGNOSIS — R110 Nausea: Secondary | ICD-10-CM

## 2018-09-28 DIAGNOSIS — I10 Essential (primary) hypertension: Secondary | ICD-10-CM

## 2018-09-28 DIAGNOSIS — R197 Diarrhea, unspecified: Secondary | ICD-10-CM | POA: Diagnosis not present

## 2018-09-28 DIAGNOSIS — C9110 Chronic lymphocytic leukemia of B-cell type not having achieved remission: Secondary | ICD-10-CM

## 2018-09-28 LAB — CMP (CANCER CENTER ONLY)
ALT: 10 U/L (ref 0–44)
AST: 10 U/L — ABNORMAL LOW (ref 15–41)
Albumin: 3.5 g/dL (ref 3.5–5.0)
Alkaline Phosphatase: 46 U/L (ref 38–126)
Anion gap: 7 (ref 5–15)
BUN: 17 mg/dL (ref 8–23)
CO2: 21 mmol/L — ABNORMAL LOW (ref 22–32)
CREATININE: 0.97 mg/dL (ref 0.44–1.00)
Calcium: 9 mg/dL (ref 8.9–10.3)
Chloride: 112 mmol/L — ABNORMAL HIGH (ref 98–111)
GFR, Est AFR Am: >60 mL/min (ref >60)
GFR, Estimated: 56 mL/min — ABNORMAL LOW (ref >60)
Glucose, Bld: 147 mg/dL — ABNORMAL HIGH (ref 70–99)
Potassium: 3.7 mmol/L (ref 3.5–5.1)
Sodium: 140 mmol/L (ref 135–145)
TOTAL PROTEIN: 6.3 g/dL — AB (ref 6.5–8.1)
Total Bilirubin: 0.5 mg/dL (ref 0.3–1.2)

## 2018-09-28 LAB — CBC WITH DIFFERENTIAL/PLATELET
Abs Immature Granulocytes: 0.16 10*3/uL — ABNORMAL HIGH (ref 0.00–0.07)
BASOS PCT: 0 %
Basophils Absolute: 0.1 10*3/uL (ref 0.0–0.1)
Eosinophils Absolute: 0.1 10*3/uL (ref 0.0–0.5)
Eosinophils Relative: 0 %
HCT: 41.2 % (ref 36.0–46.0)
Hemoglobin: 13.6 g/dL (ref 12.0–15.0)
Immature Granulocytes: 1 %
Lymphocytes Relative: 73 %
Lymphs Abs: 14.1 10*3/uL — ABNORMAL HIGH (ref 0.7–4.0)
MCH: 33.3 pg (ref 26.0–34.0)
MCHC: 33 g/dL (ref 30.0–36.0)
MCV: 100.7 fL — ABNORMAL HIGH (ref 80.0–100.0)
Monocytes Absolute: 0.5 10*3/uL (ref 0.1–1.0)
Monocytes Relative: 3 %
Neutro Abs: 4.4 10*3/uL (ref 1.7–7.7)
Neutrophils Relative %: 23 %
Platelets: 175 10*3/uL (ref 150–400)
RBC: 4.09 MIL/uL (ref 3.87–5.11)
RDW: 14.6 % (ref 11.5–15.5)
WBC: 19.2 10*3/uL — ABNORMAL HIGH (ref 4.0–10.5)
nRBC: 0.1 % (ref 0.0–0.2)

## 2018-09-28 NOTE — Progress Notes (Signed)
Met with patient and caregiver to introduce myself as Arboriculturist and to offer available resources.  Discussed one-time $39 Engineer, drilling to assist with personal expenses while going through treatment. Patient states she really doesn't give a care.  Gave my card for any additional financial questions or concerns.

## 2018-09-28 NOTE — Telephone Encounter (Signed)
No los per 01/27. °

## 2018-09-29 ENCOUNTER — Encounter: Payer: Self-pay | Admitting: Hematology

## 2018-09-29 ENCOUNTER — Ambulatory Visit: Payer: Self-pay | Admitting: Hematology

## 2018-09-29 ENCOUNTER — Ambulatory Visit
Admission: RE | Admit: 2018-09-29 | Discharge: 2018-09-29 | Disposition: A | Discharge status: Home / Self Care | Payer: Medicare Other | Source: Ambulatory Visit | Attending: Radiation Oncology | Admitting: Radiation Oncology

## 2018-09-29 ENCOUNTER — Other Ambulatory Visit: Payer: Self-pay

## 2018-09-29 DIAGNOSIS — C210 Malignant neoplasm of anus, unspecified: Secondary | ICD-10-CM | POA: Diagnosis not present

## 2018-09-30 ENCOUNTER — Ambulatory Visit
Admission: RE | Admit: 2018-09-30 | Discharge: 2018-09-30 | Disposition: A | Discharge status: Home / Self Care | Payer: Medicare Other | Source: Ambulatory Visit | Attending: Radiation Oncology | Admitting: Radiation Oncology

## 2018-09-30 DIAGNOSIS — C210 Malignant neoplasm of anus, unspecified: Secondary | ICD-10-CM | POA: Diagnosis not present

## 2018-10-01 ENCOUNTER — Ambulatory Visit
Admission: RE | Admit: 2018-10-01 | Discharge: 2018-10-01 | Disposition: A | Discharge status: Home / Self Care | Payer: Medicare Other | Source: Ambulatory Visit | Attending: Radiation Oncology | Admitting: Radiation Oncology

## 2018-10-01 DIAGNOSIS — C210 Malignant neoplasm of anus, unspecified: Secondary | ICD-10-CM | POA: Diagnosis not present

## 2018-10-01 NOTE — Progress Notes (Signed)
[facility] Clinic Note - Initial Visit   Date: 10/02/18  Bethany Oconnor MRN: [id number] DOB: [birth year]   Dear Dr. Joylene Draft:  Thank you for your kind referral of Bethany Oconnor for consultation of neuropathy. Although her history is well known to you, please allow [place] to reiterate it for the purpose of our medical record. The patient was accompanied to the clinic by caregiver, Bishop Dublin, who also provides collateral information.     History of Present Illness: Bethany Oconnor is a 78 y.o. 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    Patient is not a very good historian and much of her history is supplemented by her caregiver who has been with him for the past year since her husband's death in 07-Dec-2016.  She lives at home with 24-hour care.  Patient does have some dementia and history taking is limited by this.  From what I can understand, she has had neuropathy of the feet from many years and was told there was no treatment.  She is here for second opinion given that her numbness has been getting worse and involves to the level of the knees.  She has been using a walker for several years and fortunately has not suffered any falls.  She does not have history of diabetes.  She does have history of alcohol abuse and was drinking daily, quantity is underestimated by patient, however caregiver reports heavy alcohol use.  She no longer consumes alcohol.  She was diagnosed with anal cancer in November 2019 and is undergoing chemotherapy with Xeloda, which was started 2 weeks ago.  She has not noticed any new paresthesias with the medication so far..  She expresses significant frustration with her medical care,  stating that she does not want any aggressive measures and is not sure why she is even taking chemotherapy.  I have asked her to discuss her feelings with her oncologist.  Out-side paper records, electronic medical record, and images have been reviewed where available and summarized as:  Lab Results  Component Value Date   TSH 1.78 11/11/2017     Past Medical History:  Diagnosis Date  . Anxiety   . Arthritis   . Cerebral aneurysm    s/p endovascular colling 2006-12-08  . CLL (chronic lymphocytic leukemia) (HCC)    CLL  . Depression   . Hearing loss   . Macular degeneration   . Neuropathy   . Paroxysmal atrial fibrillation (HCC)   . Pneumonia    08-04-17  . PONV (postoperative nausea and vomiting)   . Thyroid disease     Past Surgical History:  Procedure Laterality Date  . ANEURYSM COILING    . CATARACT EXTRACTION     bilateral  . CERVICAL CONE BIOPSY    . COLONOSCOPY    . EVALUATION UNDER ANESTHESIA WITH HEMORRHOIDECTOMY N/A 09/03/2017   Procedure: EXAM UNDER ANESTHESIA, EXCISION OF ANAL LESION, POSSIBLE HEMORRHOIDECTOMY;  Surgeon: Ileana Roup, MD;  Location: [facility];  Service: General;  Laterality: N/A;  . LESION REMOVAL N/A 07/22/2018   Procedure: EXCISION OF PERIANAL LESION;  Surgeon: Ileana Roup, MD;  Location: [facility];  Service: General;  Laterality: N/A;  . LUMBAR LAMINECTOMY/DECOMPRESSION MICRODISCECTOMY Right 04/07/2018   Procedure: Right Lumbar Four-Five Lumbar Five-Sacral One Laminectomy/Microdiscectomy;  Surgeon: Kristeen Miss, MD;  Location: [facility];  Service: Neurosurgery;  Laterality: Right;  Right Lumbar Four-Five Lumbar Five-Sacral One Laminectomy/Microdiscectomy  . RECTAL EXAM UNDER ANESTHESIA N/A 07/22/2018   Procedure: ANORECTAL EXAM UNDER ANESTHESIA ERAS PATHWAY;  Surgeon: Ileana Roup, MD;  Location: [facility];  Service: General;  Laterality: N/A;  . THYROIDECTOMY    . TONSILLECTOMY       Medications:  Outpatient Encounter Medications as of  10/02/2018  Medication Sig  . acetaminophen (TYLENOL) 500 MG tablet Take 500 mg by mouth every 8 (eight) hours.   Marland Kitchen buPROPion (WELLBUTRIN XL) 150 MG 24 hr tablet   . buPROPion (WELLBUTRIN XL) 300 MG 24 hr tablet Take 450 mg by mouth daily.   . Calcium Carbonate-Vitamin D 600-400 MG-UNIT per tablet Take 1 tablet by mouth daily.  . capecitabine (XELODA) 500 MG tablet Take 3 tablets (1500mg ) by mouth 2 times daily, immediately after food, take on days of radiation only, M-F  . COMBIGAN 0.2-0.5 % ophthalmic solution Place 1 drop into the left eye 2 (two) times daily.   Marland Kitchen diltiazem (CARDIZEM CD) 180 MG 24 hr capsule Take 180 mg by mouth daily.  Marland Kitchen diltiazem (TIAZAC) 180 MG 24 hr capsule   . donepezil (ARICEPT) 5 MG tablet Take 5 mg by mouth at bedtime.  . dorzolamide (TRUSOPT) 2 % ophthalmic solution   . gabapentin (NEURONTIN) 100 MG capsule Take 100 mg by mouth 2 (two) times daily.  . hydrocortisone (ANUSOL-HC) 25 MG suppository Place 25 mg rectally 2 (two) times daily as needed for hemorrhoids or anal itching.  Marland Kitchen ibuprofen (ADVIL,MOTRIN) 400 MG tablet Take 1 tablet (400 mg total) by mouth every 8 (eight) hours as needed for moderate pain.  Marland Kitchen lamoTRIgine (LAMICTAL) 25 MG tablet Take 25-50 mg by mouth See admin instructions. Take 25 mg by mouth in the morning and take 50 mg by mouth in the evening  . latanoprost (XALATAN) 0.005 % ophthalmic solution Place 1 drop into the left eye at bedtime.   . Lidocaine (ASPERCREME LIDOCAINE) 4 % PTCH Apply 1 patch topically daily as needed (for lower back / right hip pain).  . LORazepam (ATIVAN) 0.5 MG tablet Take 0.5 mg by mouth See admin instructions. Take 0.5 mg by mouth twice daily. Take 0.5 mg by mouth twice daily as needed for anxiety  . LORazepam (ATIVAN) 1 MG tablet   . meclizine (ANTIVERT) 25 MG tablet Take 25 mg by mouth 2 (two) times daily as needed for dizziness.  . megestrol (MEGACE ES) 625 MG/5ML suspension Take 5 mLs (625 mg total) by mouth daily.  .  Melatonin 5 MG TABS Take 5 mg by mouth at bedtime.  . memantine (NAMENDA) 5 MG tablet Take 5 mg by mouth daily.  . mirabegron ER (MYRBETRIQ) 50 MG TB24 tablet Take 50 mg by mouth daily.  . Multiple Vitamins-Minerals (MULTIVITAMINS THER. W/MINERALS) TABS tablet Take 1 tablet by mouth daily.  . Multiple Vitamins-Minerals (PRESERVISION AREDS) CAPS Take 1 capsule by mouth daily.  Marland Kitchen omeprazole (PRILOSEC) 20 MG capsule Take 20 mg by mouth daily.  . ondansetron (ZOFRAN) 4 MG tablet Take 4 mg by mouth every 4 (four) hours as needed for nausea or vomiting.  . ondansetron (ZOFRAN) 8 MG tablet Take 1 tablet (8 mg total) by mouth 2 (two) times daily as needed (Nausea or vomiting).  . phenylephrine-shark liver oil-mineral oil-petrolatum (PREPARATION H) 0.25-3-14-71.9 % rectal ointment Place 1 application rectally 4 (four) times daily as needed for hemorrhoids.  . polyethylene glycol (MIRALAX /  GLYCOLAX) packet Take 17 g by mouth daily as needed for moderate constipation.  . pravastatin (PRAVACHOL) 40 MG tablet Take 40 mg by mouth daily.   Marland Kitchen PREMPRO 0.625-2.5 MG tablet Take 1 tablet by mouth daily.   . Probiotic Product (ALIGN) 4 MG CAPS Take 4 mg by mouth daily.   . prochlorperazine (COMPAZINE) 10 MG tablet Take 1 tablet (10 mg total) by mouth every 6 (six) hours as needed (Nausea or vomiting).  . trimethoprim (TRIMPEX) 100 MG tablet Take 100 mg by mouth daily.  . Wheat Dextrin (BENEFIBER PO) Take 1 each by mouth See admin instructions. Mix 1 tablespoon twice daily.   No facility-administered encounter medications on file as of 10/02/2018.      Allergies:  Allergies  Allergen Reactions  . Ambien [Zolpidem Tartrate] Other (See Comments)    UNSPECIFIED REACTION   . Fioricet-Codeine [Butalbital-Apap-Caff-Cod] Other (See Comments)    UNSPECIFIED REACTION   . Macrobid [Nitrofurantoin] Other (See Comments)    UNSPECIFIED REACTION   . Codeine Other (See Comments)    About 50 years ago, took Codeine for  scratched cornea while pregnant, and it made her extremely sedated.  . Hydrocodone-Acetaminophen Nausea And Vomiting  . Keflex [Cephalexin] Rash  . Percocet [Oxycodone-Acetaminophen] Nausea And Vomiting    Family History: Family History  Problem Relation Age of Onset  . Cancer Father 75       died  . Cerebral aneurysm Mother 48       died  . Heart attack Brother     Social History: Social History   Tobacco Use  . Smoking status: Former Smoker    Last attempt to quit: 09/02/1988    Years since quitting: 30.1  . Smokeless tobacco: Never Used  Substance Use Topics  . Alcohol use: No    Alcohol/week: 1.0 - 2.0 standard drinks    Types: 1 - 2 Glasses of wine per week    Comment: daily for 50 years  No longer drinks 09-01-17  . Drug use: No   Social History   Social History Narrative   Lives at [facility].  Retired Agricultural engineer.  Education: college.  2 children.    Review of Systems:  CONSTITUTIONAL: No fevers, chills, night sweats, or weight loss.   EYES: No visual changes or eye pain ENT: No hearing changes.  No history of nose bleeds.   RESPIRATORY: No cough, wheezing and shortness of breath.   CARDIOVASCULAR: Negative for chest pain, and palpitations.   GI: Negative for abdominal discomfort, blood in stools or black stools.  +recent change in bowel habits.   GU:  No history of incontinence.   MUSCLOSKELETAL: +history of joint pain or swelling.  No myalgias.   SKIN: Negative for lesions, rash, and itching.   HEMATOLOGY/ONCOLOGY: Negative for prolonged bleeding, bruising easily, and swollen nodes.  +history of cancer.   ENDOCRINE: Negative for cold or heat intolerance, polydipsia or goiter.   PSYCH:  +depression or anxiety symptoms.   NEURO: As Above.   Vital Signs:  BP 120/70   Pulse 87   Ht 5\' 5"  (1.651 m)   Wt 159 lb 4 oz (72.2 kg)   SpO2 96%   BMI 26.50 kg/m   General Medical Exam:   General:  Well appearing, comfortable.   Eyes/ENT: see cranial nerve  examination.   Neck: No masses appreciated.  Full range of motion without tenderness.  No carotid bruits. Respiratory:  Clear to auscultation, good air entry bilaterally.   Cardiac:  Regular rate and rhythm, no murmur.   Extremities:  No deformities, edema, or skin discoloration.  Skin:  No rashes or lesions.  Neurological Exam: MENTAL STATUS including orientation to time, place, person, recent and remote memory, attention span and concentration, language, and fund of knowledge is fair.  She is able to identify the year and the president.  She reports the vice president is Edmon Crape.  She does not know what month or day of the week.  Speech is not dysarthric.  She is somewhat agitated and frustrated.  CRANIAL NERVES: II:  No visual field defects.  Unremarkable fundi.   III-IV-VI: Pupils equal round and reactive to light.  Normal conjugate, extra-ocular eye movements in all directions of gaze.  No nystagmus.  No ptosis.   V:  Normal facial sensation.   VII:  Normal facial symmetry and movements.    VIII:  Normal hearing and vestibular function.   IX-X:  Normal palatal movement.   XI:  Normal shoulder shrug and head rotation.   XII:  Normal tongue strength and range of motion, no deviation or fasciculation.  MOTOR: Mild generalized loss of muscle bulk throughout.  Motor strength is 5/5, except 4/5 weakness in the feet.  No pronator drift.  Tone is normal.    MSRs:  Right                                                                 Left brachioradialis 2+  brachioradialis 2+  biceps 2+  biceps 2+  triceps 2+  triceps 2+  patellar 1+  patellar 1+  ankle jerk 0  ankle jerk 0  Hoffman no  Hoffman no  plantar response down  plantar response down   SENSORY: There is a gradient pattern of sensory loss below the knees bilaterally and absent to the ankles to all modalities.  Romberg testing is positive.   COORDINATION/GAIT: Normal finger-to- nose-finger.  Finger tapping is intact  bilaterally.  Gait is wide-based and assisted with a Rollator.    IMPRESSION: Peripheral neuropathy most likely contributed by history of alcohol abuse. Her neurological examination shows a length dependent pattern of large fiber peripheral neuropathy. I had extensive discussion with the patient regarding the pathogenesis, etiology, management, and natural course of neuropathy.  Unfortunately, there is no effective treatment for neuropathy and it does ends to be slowly progressive, which is what she is experiencing.  Further, she may notice worsening neuropathy with chemotherapy.  I will check a few labs to see if there is any nutritional deficiency contributing to her symptoms.  She does not have pain and symptoms are predominantly numbness for which there is no effective medication.  For her balance, I offered physical therapy which she is not interested in.   Patient's frustration with chronic medical conditions were briefly addressed however I did ask her to discuss this with her cancer team as she seems to express wishes along palliative care, while also is struggling with coping with fear of dying.  She may need to see a counselor to address some of this, and will defer this to her primary care provider and oncology team.   Return to clinic as needed  Thank you for allowing me to participate in patient's care.  If I can  answer any additional questions, I would be pleased to do so.    Sincerely,    Anisah Kuck K. Posey Pronto, DO

## 2018-10-02 ENCOUNTER — Other Ambulatory Visit (INDEPENDENT_AMBULATORY_CARE_PROVIDER_SITE_OTHER): Payer: Medicare Other

## 2018-10-02 ENCOUNTER — Encounter: Payer: Self-pay | Admitting: Neurology

## 2018-10-02 ENCOUNTER — Ambulatory Visit
Admission: RE | Admit: 2018-10-02 | Discharge: 2018-10-02 | Disposition: A | Discharge status: Home / Self Care | Payer: Medicare Other | Source: Ambulatory Visit | Attending: Radiation Oncology | Admitting: Radiation Oncology

## 2018-10-02 ENCOUNTER — Ambulatory Visit: Payer: Self-pay | Admitting: Neurology

## 2018-10-02 ENCOUNTER — Ambulatory Visit: Payer: Medicare Other | Admitting: Neurology

## 2018-10-02 VITALS — BP 120/70 | HR 87 | Ht 65.0 in | Wt 159.2 lb

## 2018-10-02 DIAGNOSIS — G621 Alcoholic polyneuropathy: Secondary | ICD-10-CM

## 2018-10-02 DIAGNOSIS — C210 Malignant neoplasm of anus, unspecified: Secondary | ICD-10-CM | POA: Diagnosis not present

## 2018-10-02 LAB — VITAMIN B12: Vitamin B-12: 285 pg/mL (ref 211–911)

## 2018-10-02 LAB — FOLATE: Folate: >24 ng/mL (ref >5.9)

## 2018-10-02 NOTE — Progress Notes (Signed)
Bethany Oconnor   [phone] [phone]) [phone]   Clinic Follow up Note   Patient Care Team: Crist Infante, MD as PCP - General (Internal Medicine) Sherren Mocha, MD as PCP - Cardiology (Cardiology) Crist Infante, MD (Internal Medicine) Newton Pigg, MD as Consulting Physician (Obstetrics and Gynecology) Truitt Merle, MD as Consulting Physician (Hematology) Leta Baptist, MD as Consulting Physician (Otolaryngology) Sherlynn Stalls, MD as Consulting Physician (Ophthalmology) Regal, Tamala Fothergill, DPM as Consulting Physician (Podiatry) Harriett Sine, MD as Consulting Physician (Dermatology) Erroll Luna, MD as Consulting Physician (General Surgery)  Date of Service:  10/05/2018  CHIEF COMPLAINT: F/u of anal cancer  SUMMARY OF ONCOLOGIC HISTORY: Oncology History   Cancer Staging Anal cancer [facility]) Staging form: Anus, AJCC 8th Edition - Clinical stage from 08/12/2018: Stage IIIA (cT1, cN1a, cM0) - Signed by Truitt Merle, MD on 08/25/2018 - Pathologic: No stage assigned - Unsigned       Anal cancer ([place])   07/22/2018 Surgery    Wide local excision of anal margin lesion (SCC) and hemorrhoidectomy by Dr. Dema Severin     07/22/2018 Pathology Results    Anus, biopsy, marginal lesion - INVASIVE SQUAMOUS CELL CARCINOMA, MODERATELY DIFFERENTIATED, SPANNING 0.7 CM. - HIGH GRADE ANAL INTRAEPITHELIAL NEOPLASIA (AIN-III). - INVASIVE CARCINOMA IS BROADLY LESS THAN 0.1 CM TO THE ANTERIOR MARGIN OF SPECIMEN 1.    07/22/2018 Initial Diagnosis    Anal cancer ([place])    08/12/2018 Cancer Staging    Staging form: Anus, AJCC 8th Edition - Clinical stage from 08/12/2018: Stage IIIA (cT1, cN1a, cM0) - Signed by Truitt Merle, MD on 08/25/2018    08/12/2018 Imaging    PET IMPRESSION: 1. Focal hypermetabolism in the right anus consistent with known primary tumor. 2. Hypermetabolic right inguinal metastatic lymph node. No other metastatic disease identified in the neck, chest, abdomen,  or pelvis. 3. Hypermetabolic calcified left pleural plaques. These plaques were visualized on CT chest of 09/06/2014 and are unchanged in the interval. No right hemithoracic pleural plaques. Given unilateral disease, prior hemothorax or empyema would be a consideration. 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 4. 3.9 x 2.3 cm left adnexal cyst without hypermetabolic activity. Attention on follow-up recommended. 5.  Aortic Atherosclerois (ICD10-170.0)     08/21/2018 Pathology Results    Right inguinal lymph node biopsy showed metastatic squamous cell carcinoma    09/21/2018 -  Chemotherapy    Concurrent chemoRT with Xeloda 1500mg  BID M-F starting 09/21/2018. Xeloda was stopped on 10/05/18 due to diarrhea     09/21/2018 -  Radiation Therapy    Concurrnet ChemoRT with Dr. Lisbeth Renshaw starting 09/21/18. Plan to complete 10/30/18.       CURRENT THERAPY:  Concurrent chemoRT with Xeloda 1500mg  BID M-F starting 09/21/2018, stopped on 10/05/2018 due to poor tolerance   INTERVAL HISTORY:  Bethany Oconnor is here for a follow up of treatment. She presents to the clinic today with her caregivers. She notes last night having significant diarrhea to where she "felt like she was going to die". This happened late at night and required to be cleaned. She overall has been able to make it to the toilet. She has gone to the bathroom twice today without watery stool. She uses depends.  She notes rectal pain which is moderate to her. She has been abel to sleep. She has a doughnut but does not feel that it helps. She still has blood only when she wipes. She does have nausea but denies abdominal pain. She has  been able to eat adequately overall. She notes she is not happy overall with her current treatment but is willing to continue. She is being told she may have to go to short term rehab facility after treatment to allow her to recover. She will see her Psychiatrist in 3 days. Her Caregiver would  like her Lorazepam increased because she is very emotional.      REVIEW OF SYSTEMS:   Constitutional: Denies fevers, chills or abnormal weight loss Eyes: Denies blurriness of vision Ears, nose, mouth, throat, and face: Denies mucositis or sore throat Respiratory: Denies cough, dyspnea or wheezes Cardiovascular: Denies palpitation, chest discomfort or lower extremity swelling Gastrointestinal: (+) Diarrhea and nausea  Skin: Denies abnormal skin rashes Lymphatics: Denies new lymphadenopathy or easy bruising Neurological:Denies numbness, tingling or new weaknesses Behavioral/Psych: Mood is stable, no new changes (+) Emotional  All other systems were reviewed with the patient and are negative.  MEDICAL HISTORY:  Past Medical History:  Diagnosis Date  . Anxiety   . Arthritis   . Cerebral aneurysm    s/p endovascular colling 2008  . CLL (chronic lymphocytic leukemia) (HCC)    CLL  . Depression   . Hearing loss   . Macular degeneration   . Neuropathy   . Paroxysmal atrial fibrillation (HCC)   . Pneumonia    08-04-17  . PONV (postoperative nausea and vomiting)   . Thyroid disease     SURGICAL HISTORY: Past Surgical History:  Procedure Laterality Date  . ANEURYSM COILING    . CATARACT EXTRACTION     bilateral  . CERVICAL CONE BIOPSY    . COLONOSCOPY    . EVALUATION UNDER ANESTHESIA WITH HEMORRHOIDECTOMY N/A 09/03/2017   Procedure: EXAM UNDER ANESTHESIA, EXCISION OF ANAL LESION, POSSIBLE HEMORRHOIDECTOMY;  Surgeon: Ileana Roup, MD;  Location: [facility];  Service: General;  Laterality: N/A;  . LESION REMOVAL N/A 07/22/2018   Procedure: EXCISION OF PERIANAL LESION;  Surgeon: Ileana Roup, MD;  Location: [place];  Service: General;  Laterality: N/A;  . LUMBAR LAMINECTOMY/DECOMPRESSION MICRODISCECTOMY Right 04/07/2018   Procedure: Right Lumbar Four-Five Lumbar Five-Sacral One Laminectomy/Microdiscectomy;  Surgeon: Kristeen Miss, MD;  Location: [place];  Service:  Neurosurgery;  Laterality: Right;  Right Lumbar Four-Five Lumbar Five-Sacral One Laminectomy/Microdiscectomy  . RECTAL EXAM UNDER ANESTHESIA N/A 07/22/2018   Procedure: ANORECTAL EXAM UNDER ANESTHESIA ERAS PATHWAY;  Surgeon: Ileana Roup, MD;  Location: [place];  Service: General;  Laterality: N/A;  . THYROIDECTOMY    . TONSILLECTOMY      I have reviewed the social history and family history with the patient and they are unchanged from previous note.  ALLERGIES:  is allergic to [employer] tartrate]; fioricet-codeine [butalbital-apap-caff-cod]; macrobid [nitrofurantoin]; codeine; hydrocodone-acetaminophen; keflex [cephalexin]; and percocet [oxycodone-acetaminophen].  MEDICATIONS:  Current Outpatient Medications  Medication Sig Dispense Refill  . acetaminophen (TYLENOL) 500 MG tablet Take 500 mg by mouth every 8 (eight) hours.     Marland Kitchen buPROPion (WELLBUTRIN XL) 150 MG 24 hr tablet     . buPROPion (WELLBUTRIN XL) 300 MG 24 hr tablet Take 450 mg by mouth daily.     . Calcium Carbonate-Vitamin D 600-400 MG-UNIT per tablet Take 1 tablet by mouth daily.    . capecitabine (XELODA) 500 MG tablet Take 3 tablets (1500mg ) by mouth 2 times daily, immediately after food, take on days of radiation only, M-F 180 tablet 0  . COMBIGAN 0.2-0.5 % ophthalmic solution Place 1 drop into the left eye 2 (two) times daily.     Marland Kitchen  diltiazem (CARDIZEM CD) 180 MG 24 hr capsule Take 180 mg by mouth daily.    Marland Kitchen diltiazem (TIAZAC) 180 MG 24 hr capsule     . donepezil (ARICEPT) 5 MG tablet Take 5 mg by mouth at bedtime.    . dorzolamide (TRUSOPT) 2 % ophthalmic solution     . gabapentin (NEURONTIN) 100 MG capsule Take 100 mg by mouth 2 (two) times daily.    . hydrocortisone (ANUSOL-HC) 25 MG suppository Place 25 mg rectally 2 (two) times daily as needed for hemorrhoids or anal itching.    Marland Kitchen ibuprofen (ADVIL,MOTRIN) 400 MG tablet Take 1 tablet (400 mg total) by mouth every 8 (eight) hours as needed for moderate  pain. 60 tablet 0  . lamoTRIgine (LAMICTAL) 25 MG tablet Take 25-50 mg by mouth See admin instructions. Take 25 mg by mouth in the morning and take 50 mg by mouth in the evening    . latanoprost (XALATAN) 0.005 % ophthalmic solution Place 1 drop into the left eye at bedtime.     . Lidocaine (ASPERCREME LIDOCAINE) 4 % PTCH Apply 1 patch topically daily as needed (for lower back / right hip pain).    . LORazepam (ATIVAN) 0.5 MG tablet Take 0.5 mg by mouth See admin instructions. Take 0.5 mg by mouth twice daily. Take 0.5 mg by mouth twice daily as needed for anxiety    . LORazepam (ATIVAN) 1 MG tablet     . meclizine (ANTIVERT) 25 MG tablet Take 25 mg by mouth 2 (two) times daily as needed for dizziness.    . megestrol (MEGACE ES) 625 MG/5ML suspension Take 5 mLs (625 mg total) by mouth daily. 150 mL 2  . Melatonin 5 MG TABS Take 5 mg by mouth at bedtime.    . memantine (NAMENDA) 5 MG tablet Take 5 mg by mouth daily.    . mirabegron ER (MYRBETRIQ) 50 MG TB24 tablet Take 50 mg by mouth daily.    . Multiple Vitamins-Minerals (MULTIVITAMINS THER. W/MINERALS) TABS tablet Take 1 tablet by mouth daily.    . Multiple Vitamins-Minerals (PRESERVISION AREDS) CAPS Take 1 capsule by mouth daily.    Marland Kitchen omeprazole (PRILOSEC) 20 MG capsule Take 20 mg by mouth daily.    . ondansetron (ZOFRAN) 4 MG tablet Take 4 mg by mouth every 4 (four) hours as needed for nausea or vomiting.    . ondansetron (ZOFRAN) 8 MG tablet Take 1 tablet (8 mg total) by mouth 2 (two) times daily as needed (Nausea or vomiting). 30 tablet 1  . phenylephrine-shark liver oil-mineral oil-petrolatum (PREPARATION H) 0.25-3-14-71.9 % rectal ointment Place 1 application rectally 4 (four) times daily as needed for hemorrhoids.    . polyethylene glycol (MIRALAX / GLYCOLAX) packet Take 17 g by mouth daily as needed for moderate constipation.    . pravastatin (PRAVACHOL) 40 MG tablet Take 40 mg by mouth daily.     Marland Kitchen PREMPRO 0.625-2.5 MG tablet Take 1  tablet by mouth daily.   3  . Probiotic Product (ALIGN) 4 MG CAPS Take 4 mg by mouth daily.     . prochlorperazine (COMPAZINE) 10 MG tablet Take 1 tablet (10 mg total) by mouth every 6 (six) hours as needed (Nausea or vomiting). 30 tablet 1  . trimethoprim (TRIMPEX) 100 MG tablet Take 100 mg by mouth daily.    . Wheat Dextrin (BENEFIBER PO) Take 1 each by mouth See admin instructions. Mix 1 tablespoon twice daily.     No current facility-administered medications  for this visit.     PHYSICAL EXAMINATION: ECOG PERFORMANCE STATUS: 3 - Symptomatic, >50% confined to bed  Vitals:   10/05/18 1408  BP: 127/63  Pulse: 69  Resp: 18  Temp: 98.2 F (36.8 C)  SpO2: 97%   Filed Weights   10/05/18 1408  Weight: 161 lb (73 kg)    GENERAL:alert, no distress and comfortable SKIN: skin color, texture, turgor are normal, no rashes or significant lesions EYES: normal, Conjunctiva are pink and non-injected, sclera clear OROPHARYNX:no exudate, no erythema and lips, buccal mucosa, and tongue normal  NECK: supple, thyroid normal size, non-tender, without nodularity LYMPH:  no palpable lymphadenopathy in the cervical, axillary or inguinal LUNGS: clear to auscultation and percussion with normal breathing effort HEART: regular rate & rhythm and no murmurs and no lower extremity edema ABDOMEN:abdomen soft, non-tender and normal bowel sounds Musculoskeletal:no cyanosis of digits and no clubbing  NEURO: alert & oriented x 3 with fluent speech, no focal motor/sensory deficits RECTAL EXAM: (+) Diffuse skin erythema of buttocks and vagina, no discharge or sin ulcer. (+) surgical scar healed well  LABORATORY DATA:  I have reviewed the data as listed CBC Latest Ref Rng & Units 10/05/2018 09/28/2018 09/21/2018  WBC 4.0 - 10.5 K/uL 13.1(H) 19.2(H) 31.7(H)  Hemoglobin 12.0 - 15.0 g/dL 13.4 13.6 14.5  Hematocrit 36.0 - 46.0 % 40.1 41.2 43.6  Platelets 150 - 400 K/uL 148(L) 175 168     CMP Latest Ref Rng & Units  10/05/2018 09/28/2018 09/21/2018  Glucose 70 - 99 mg/dL 86 147(H) 136(H)  BUN 8 - 23 mg/dL 12 17 14   Creatinine 0.44 - 1.00 mg/dL 0.83 0.97 0.93  Sodium 135 - 145 mmol/L 141 140 142  Potassium 3.5 - 5.1 mmol/L 3.8 3.7 3.7  Chloride 98 - 111 mmol/L 111 112(H) 111  CO2 22 - 32 mmol/L 23 21(L) 22  Calcium 8.9 - 10.3 mg/dL 9.2 9.0 9.1  Total Protein 6.5 - 8.1 g/dL 6.2(L) 6.3(L) 6.4(L)  Total Bilirubin 0.3 - 1.2 mg/dL 0.5 0.5 0.4  Alkaline Phos 38 - 126 U/L 49 46 48  AST 15 - 41 U/L 14(L) 10(L) 15  ALT 0 - 44 U/L 12 10 14       RADIOGRAPHIC STUDIES: I have personally reviewed the radiological images as listed and agreed with the findings in the report. No results found.   ASSESSMENT & PLAN:  Bethany Oconnor is a 78 y.o. female with   1.Anal Squamous Cell Carcinoma, cT1N1aM0 stage IIIA -Diagnosed in 07/2018. S/p surgical resection with close margins. -Her staging PET scan was negative for distant metastasis, but showed hypermetabolic right inguinal lymph node, and a biopsy confirmed squamous cell carcinoma. -She was not a good candidate for 5-fu/mitomycin chemo with radiation. She started concurrent chemoRT with Xeloda 1500mg  BID M-F on 09/21/18. She tolerated moderately well with intermittent diarrhea, nausea and rectal pain.  -Patient experienced severe diarrhea last night, and was very angry during her visit. I discussed side effects will increase as she continues treatment. I discussed the option of holding Xeloda and continue radiation alone. Both pt and her POW opted to stop Xeloda given her side effects.  -Labs reviewed, CBC mostly WNL except WBC at 13.1. CMP is still pending. Will monitor with chemotherapy.  -Continue radiation. Pt agrees.  -F/u in 1 week.   2. Chroniclymphocyticleukemia (CLL), stage 0 -Diagnosed in 2015. Currently under observationand has not required any treatment -Her ALC has been trending up, but also fluctuates, not sure if  it's related to her recent anal  cancer and surgery  -she has no B symptoms, noanemia or thrombocytopenia,will continuemonitoring. -CBC reviewed, WBC at 13.1 (10/05/18), which has significantly decreased due to chemotherapy  3. HTN -f/u with PCP -controlled   4. Atrial Fibrillation -f/u with cardiology, stable.   5. Immunizations -Continue to stay up to date with immunizations.  6. Dementia -Currently on Donepezil. Will continue. -She has a caregiver andtwoPOAs.  7. Nausea with low appetite, weight loss, diarrhea  -Likely related to anal cancer and treatment.  -Continue to f/u with dietician  -I encourage her to continue nutritional supplement -Stable mild nausea. She is eating adequately and her weight is trending  -I encouraged her to use Imodium as needed for diarrhea.  I anticipate her diarrhea will improve since we will hold her chemotherapy Xeloda from now.   8. Depression, Anxiety, irritability -She is angry and depressed about her biopsy results. -She previously expressed wishes to die and join her husband in heaven. -She remains angry and emotional about her condition and diagnosis.  -Continue Lorazepam as needed -Continue to follow up with Psychiatrist, she has appointment with him later this week.    Plan -Stop Xeloda due to diarreha, continue Radiation  -Lab, f/u in 1 week    No problem-specific Assessment & Plan notes found for this encounter.   No orders of the defined types were placed in this encounter.  All questions were answered. The patient knows to call the clinic with any problems, questions or concerns. No barriers to learning was detected. I spent 20 minutes counseling the patient face to face. The total time spent in the appointment was 25 minutes and more than 50% was on counseling and review of test results     Truitt Merle, MD 10/05/2018   I, Joslyn Devon, am acting as scribe for Truitt Merle, MD.   I have reviewed the above documentation for accuracy and completeness,  and I agree with the above.

## 2018-10-02 NOTE — Patient Instructions (Signed)
Check labs  Referral to home physical therapy, if desired

## 2018-10-05 ENCOUNTER — Encounter: Payer: Self-pay | Admitting: Hematology

## 2018-10-05 ENCOUNTER — Inpatient Hospital Stay: Payer: Medicare Other | Attending: Hematology

## 2018-10-05 ENCOUNTER — Inpatient Hospital Stay (HOSPITAL_BASED_OUTPATIENT_CLINIC_OR_DEPARTMENT_OTHER): Payer: Medicare Other | Admitting: Hematology

## 2018-10-05 ENCOUNTER — Telehealth: Payer: Self-pay | Admitting: Hematology

## 2018-10-05 ENCOUNTER — Ambulatory Visit
Admission: RE | Admit: 2018-10-05 | Discharge: 2018-10-05 | Disposition: A | Discharge status: Home / Self Care | Payer: Medicare Other | Source: Ambulatory Visit | Attending: Radiation Oncology | Admitting: Radiation Oncology

## 2018-10-05 VITALS — BP 127/63 | HR 69 | Temp 98.2°F | Resp 18 | Ht 65.0 in | Wt 161.0 lb

## 2018-10-05 DIAGNOSIS — G301 Alzheimer's disease with late onset: Secondary | ICD-10-CM

## 2018-10-05 DIAGNOSIS — R110 Nausea: Secondary | ICD-10-CM

## 2018-10-05 DIAGNOSIS — I4891 Unspecified atrial fibrillation: Secondary | ICD-10-CM | POA: Diagnosis not present

## 2018-10-05 DIAGNOSIS — C9110 Chronic lymphocytic leukemia of B-cell type not having achieved remission: Secondary | ICD-10-CM | POA: Insufficient documentation

## 2018-10-05 DIAGNOSIS — R197 Diarrhea, unspecified: Secondary | ICD-10-CM | POA: Insufficient documentation

## 2018-10-05 DIAGNOSIS — I10 Essential (primary) hypertension: Secondary | ICD-10-CM | POA: Diagnosis not present

## 2018-10-05 DIAGNOSIS — I480 Paroxysmal atrial fibrillation: Secondary | ICD-10-CM

## 2018-10-05 DIAGNOSIS — C774 Secondary and unspecified malignant neoplasm of inguinal and lower limb lymph nodes: Secondary | ICD-10-CM | POA: Diagnosis not present

## 2018-10-05 DIAGNOSIS — G893 Neoplasm related pain (acute) (chronic): Secondary | ICD-10-CM | POA: Diagnosis not present

## 2018-10-05 DIAGNOSIS — F0281 Dementia in other diseases classified elsewhere with behavioral disturbance: Secondary | ICD-10-CM | POA: Insufficient documentation

## 2018-10-05 DIAGNOSIS — Z510 Encounter for antineoplastic radiation therapy: Secondary | ICD-10-CM | POA: Insufficient documentation

## 2018-10-05 DIAGNOSIS — C210 Malignant neoplasm of anus, unspecified: Secondary | ICD-10-CM | POA: Insufficient documentation

## 2018-10-05 DIAGNOSIS — F419 Anxiety disorder, unspecified: Secondary | ICD-10-CM | POA: Insufficient documentation

## 2018-10-05 LAB — CBC WITH DIFFERENTIAL/PLATELET
Abs Immature Granulocytes: 0.13 10*3/uL — ABNORMAL HIGH (ref 0.00–0.07)
Basophils Absolute: 0 10*3/uL (ref 0.0–0.1)
Basophils Relative: 0 %
Eosinophils Absolute: 0.1 10*3/uL (ref 0.0–0.5)
Eosinophils Relative: 1 %
HCT: 40.1 % (ref 36.0–46.0)
HEMOGLOBIN: 13.4 g/dL (ref 12.0–15.0)
Immature Granulocytes: 1 %
Lymphocytes Relative: 69 %
Lymphs Abs: 9.1 10*3/uL — ABNORMAL HIGH (ref 0.7–4.0)
MCH: 33.6 pg (ref 26.0–34.0)
MCHC: 33.4 g/dL (ref 30.0–36.0)
MCV: 100.5 fL — ABNORMAL HIGH (ref 80.0–100.0)
MONO ABS: 0.6 10*3/uL (ref 0.1–1.0)
Monocytes Relative: 5 %
Neutro Abs: 3.1 10*3/uL (ref 1.7–7.7)
Neutrophils Relative %: 24 %
Platelets: 148 10*3/uL — ABNORMAL LOW (ref 150–400)
RBC: 3.99 MIL/uL (ref 3.87–5.11)
RDW: 14.9 % (ref 11.5–15.5)
WBC: 13.1 10*3/uL — ABNORMAL HIGH (ref 4.0–10.5)
nRBC: 0 % (ref 0.0–0.2)

## 2018-10-05 LAB — CMP (CANCER CENTER ONLY)
ALT: 12 U/L (ref 0–44)
AST: 14 U/L — ABNORMAL LOW (ref 15–41)
Albumin: 3.6 g/dL (ref 3.5–5.0)
Alkaline Phosphatase: 49 U/L (ref 38–126)
Anion gap: 7 (ref 5–15)
BUN: 12 mg/dL (ref 8–23)
CHLORIDE: 111 mmol/L (ref 98–111)
CO2: 23 mmol/L (ref 22–32)
Calcium: 9.2 mg/dL (ref 8.9–10.3)
Creatinine: 0.83 mg/dL (ref 0.44–1.00)
GFR, Est AFR Am: >60 mL/min (ref >60)
GFR, Estimated: >60 mL/min (ref >60)
Glucose, Bld: 86 mg/dL (ref 70–99)
Potassium: 3.8 mmol/L (ref 3.5–5.1)
Sodium: 141 mmol/L (ref 135–145)
Total Bilirubin: 0.5 mg/dL (ref 0.3–1.2)
Total Protein: 6.2 g/dL — ABNORMAL LOW (ref 6.5–8.1)

## 2018-10-05 LAB — VITAMIN B1: VITAMIN B1 (THIAMINE): 9 nmol/L (ref 8–30)

## 2018-10-05 NOTE — Telephone Encounter (Signed)
No los per 02/03.

## 2018-10-06 ENCOUNTER — Telehealth: Payer: Self-pay | Admitting: *Deleted

## 2018-10-06 ENCOUNTER — Ambulatory Visit
Admission: RE | Admit: 2018-10-06 | Discharge: 2018-10-06 | Disposition: A | Discharge status: Home / Self Care | Payer: Medicare Other | Source: Ambulatory Visit | Attending: Radiation Oncology | Admitting: Radiation Oncology

## 2018-10-06 ENCOUNTER — Telehealth: Payer: Self-pay

## 2018-10-06 DIAGNOSIS — C210 Malignant neoplasm of anus, unspecified: Secondary | ICD-10-CM | POA: Diagnosis not present

## 2018-10-06 NOTE — Telephone Encounter (Signed)
Left message giving patient results.  

## 2018-10-06 NOTE — Telephone Encounter (Signed)
-----   Message from [name], DO sent at 10/02/2018  4:31 PM EST ----- Please notify patient lab are within normal limits.  Thank you.

## 2018-10-06 NOTE — Telephone Encounter (Signed)
Faxed order to stop Xeloda to [place] at [place].

## 2018-10-07 ENCOUNTER — Ambulatory Visit
Admission: RE | Admit: 2018-10-07 | Discharge: 2018-10-07 | Disposition: A | Discharge status: Home / Self Care | Payer: Medicare Other | Source: Ambulatory Visit | Attending: Radiation Oncology | Admitting: Radiation Oncology

## 2018-10-07 DIAGNOSIS — C210 Malignant neoplasm of anus, unspecified: Secondary | ICD-10-CM | POA: Diagnosis not present

## 2018-10-08 ENCOUNTER — Telehealth: Payer: Self-pay | Admitting: *Deleted

## 2018-10-08 ENCOUNTER — Ambulatory Visit
Admission: RE | Admit: 2018-10-08 | Discharge: 2018-10-08 | Disposition: A | Discharge status: Home / Self Care | Payer: Medicare Other | Source: Ambulatory Visit | Attending: Radiation Oncology | Admitting: Radiation Oncology

## 2018-10-08 ENCOUNTER — Other Ambulatory Visit: Payer: Self-pay | Admitting: Radiation Oncology

## 2018-10-08 DIAGNOSIS — C210 Malignant neoplasm of anus, unspecified: Secondary | ICD-10-CM | POA: Diagnosis not present

## 2018-10-08 MED ORDER — HYDROMORPHONE HCL 2 MG PO TABS: 2.0000 mg | ORAL_TABLET | Freq: Four times a day (QID) | ORAL | 0 refills | Status: DC | PRN

## 2018-10-08 NOTE — Telephone Encounter (Signed)
Left message giving patient result and instructions.

## 2018-10-08 NOTE — Telephone Encounter (Signed)
-----   Message from [name], DO sent at 10/06/2018 12:34 PM EST ----- Please inform patient that her vitamin B12 level is low-normal.  She can start over-the-counter vitamin B12 1000 mcg daily.  Remaining labs are normal.

## 2018-10-09 ENCOUNTER — Ambulatory Visit
Admission: RE | Admit: 2018-10-09 | Discharge: 2018-10-09 | Disposition: A | Discharge status: Home / Self Care | Payer: Medicare Other | Source: Ambulatory Visit | Attending: Radiation Oncology | Admitting: Radiation Oncology

## 2018-10-09 DIAGNOSIS — C210 Malignant neoplasm of anus, unspecified: Secondary | ICD-10-CM | POA: Diagnosis not present

## 2018-10-09 NOTE — Progress Notes (Signed)
[place]   [phone] [phone]   Clinic Follow up Note   Patient Care Team: Crist Infante, MD as PCP - General (Internal Medicine) Sherren Mocha, MD as PCP - Cardiology (Cardiology) Crist Infante, MD (Internal Medicine) Newton Pigg, MD as Consulting Physician (Obstetrics and Gynecology) Truitt Merle, MD as Consulting Physician (Hematology) Leta Baptist, MD as Consulting Physician (Otolaryngology) Sherlynn Stalls, MD as Consulting Physician (Ophthalmology) Regal, Tamala Fothergill, DPM as Consulting Physician (Podiatry) Harriett Sine, MD as Consulting Physician (Dermatology) Erroll Luna, MD as Consulting Physician (General Surgery)  Date of Service:  10/12/2018  CHIEF COMPLAINT: F/u of anal cancer  SUMMARY OF ONCOLOGIC HISTORY: Oncology History   Cancer Staging Anal cancer [facility]) Staging form: Anus, AJCC 8th Edition - Clinical stage from 08/12/2018: Stage IIIA (cT1, cN1a, cM0) - Signed by Truitt Merle, MD on 08/25/2018 - Pathologic: No stage assigned - Unsigned       Anal cancer ([place])   07/22/2018 Surgery    Wide local excision of anal margin lesion (SCC) and hemorrhoidectomy by Dr. Dema Severin     07/22/2018 Pathology Results    Anus, biopsy, marginal lesion - INVASIVE SQUAMOUS CELL CARCINOMA, MODERATELY DIFFERENTIATED, SPANNING 0.7 CM. - HIGH GRADE ANAL INTRAEPITHELIAL NEOPLASIA (AIN-III). - INVASIVE CARCINOMA IS BROADLY LESS THAN 0.1 CM TO THE ANTERIOR MARGIN OF SPECIMEN 1.    07/22/2018 Initial Diagnosis    Anal cancer ([place])    08/12/2018 Cancer Staging    Staging form: Anus, AJCC 8th Edition - Clinical stage from 08/12/2018: Stage IIIA (cT1, cN1a, cM0) - Signed by Truitt Merle, MD on 08/25/2018    08/12/2018 Imaging    PET IMPRESSION: 1. Focal hypermetabolism in the right anus consistent with known primary tumor. 2. Hypermetabolic right inguinal metastatic lymph node. No other metastatic disease identified in the neck, chest, abdomen,  or pelvis. 3. Hypermetabolic calcified left pleural plaques. These plaques were visualized on CT chest of 09/06/2014 and are unchanged in the interval. No right hemithoracic pleural plaques. Given unilateral disease, prior hemothorax or empyema would be a consideration. 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 4. 3.9 x 2.3 cm left adnexal cyst without hypermetabolic activity. Attention on follow-up recommended. 5.  Aortic Atherosclerois (ICD10-170.0)     08/21/2018 Pathology Results    Right inguinal lymph node biopsy showed metastatic squamous cell carcinoma    09/21/2018 -  Chemotherapy    Concurrent chemoRT with Xeloda 1500mg  BID M-F starting 09/21/2018. Xeloda was stopped on 10/05/18 due to diarrhea     09/21/2018 -  Radiation Therapy    Concurrnet ChemoRT with Dr. Lisbeth Renshaw starting 09/21/18. Plan to complete 10/30/18.       CURRENT THERAPY:  Concurrent chemoRT with Xeloda1500mg  BID M-Fstarting 09/21/2018, stopped on 10/05/2018 due to poor tolerance   INTERVAL HISTORY:  Bethany Oconnor is here for a follow up of treatment. She presents to the clinic today with Caregiver and family members. She notes she does not want to continue treatment and feels her cancer will take her, so she wants to die.  Her caregiver notes Dr. Lisbeth Renshaw gave her pain medication and was given zinc in ointment for her buttocks. She has been taking Dilaudid 2mg  every 6 hours. She feels this does not relieve her as much as she needs, but she was able to rest more and be able to be more comfortable. She is currently in rehab facility at [facility] while on treatment. She remains to have appetite and gaining weight.  REVIEW OF SYSTEMS:   Constitutional: Denies fevers, chills or abnormal weight loss Eyes: Denies blurriness of vision Ears, nose, mouth, throat, and face: Denies mucositis or sore throat Respiratory: Denies cough, dyspnea or wheezes Cardiovascular: Denies palpitation, chest  discomfort or lower extremity swelling Gastrointestinal:  Denies nausea, heartburn or change in bowel habits (+) Buttock and rectal pain  Skin: Denies abnormal skin rashes Lymphatics: Denies new lymphadenopathy or easy bruising Neurological:Denies numbness, tingling or new weaknesses Behavioral/Psych: Mood is stable, no new changes  All other systems were reviewed with the patient and are negative.  MEDICAL HISTORY:  Past Medical History:  Diagnosis Date  . Anxiety   . Arthritis   . Cerebral aneurysm    s/p endovascular colling 2008  . CLL (chronic lymphocytic leukemia) (HCC)    CLL  . Depression   . Hearing loss   . Macular degeneration   . Neuropathy   . Paroxysmal atrial fibrillation (HCC)   . Pneumonia    08-04-17  . PONV (postoperative nausea and vomiting)   . Thyroid disease     SURGICAL HISTORY: Past Surgical History:  Procedure Laterality Date  . ANEURYSM COILING    . CATARACT EXTRACTION     bilateral  . CERVICAL CONE BIOPSY    . COLONOSCOPY    . EVALUATION UNDER ANESTHESIA WITH HEMORRHOIDECTOMY N/A 09/03/2017   Procedure: EXAM UNDER ANESTHESIA, EXCISION OF ANAL LESION, POSSIBLE HEMORRHOIDECTOMY;  Surgeon: Ileana Roup, MD;  Location: [facility];  Service: General;  Laterality: N/A;  . LESION REMOVAL N/A 07/22/2018   Procedure: EXCISION OF PERIANAL LESION;  Surgeon: Ileana Roup, MD;  Location: [facility];  Service: General;  Laterality: N/A;  . LUMBAR LAMINECTOMY/DECOMPRESSION MICRODISCECTOMY Right 04/07/2018   Procedure: Right Lumbar Four-Five Lumbar Five-Sacral One Laminectomy/Microdiscectomy;  Surgeon: Kristeen Miss, MD;  Location: [place];  Service: Neurosurgery;  Laterality: Right;  Right Lumbar Four-Five Lumbar Five-Sacral One Laminectomy/Microdiscectomy  . RECTAL EXAM UNDER ANESTHESIA N/A 07/22/2018   Procedure: ANORECTAL EXAM UNDER ANESTHESIA ERAS PATHWAY;  Surgeon: Ileana Roup, MD;  Location: [place];  Service: General;  Laterality: N/A;  .  THYROIDECTOMY    . TONSILLECTOMY      I have reviewed the social history and family history with the patient and they are unchanged from previous note.  ALLERGIES:  is allergic to [employer] tartrate]; fioricet-codeine [butalbital-apap-caff-cod]; macrobid [nitrofurantoin]; codeine; hydrocodone-acetaminophen; keflex [cephalexin]; and percocet [oxycodone-acetaminophen].  MEDICATIONS:  Current Outpatient Medications  Medication Sig Dispense Refill  . acetaminophen (TYLENOL) 500 MG tablet Take 500 mg by mouth every 8 (eight) hours.     Marland Kitchen buPROPion (WELLBUTRIN XL) 150 MG 24 hr tablet     . buPROPion (WELLBUTRIN XL) 300 MG 24 hr tablet Take 450 mg by mouth daily.     . Calcium Carbonate-Vitamin D 600-400 MG-UNIT per tablet Take 1 tablet by mouth daily.    . capecitabine (XELODA) 500 MG tablet Take 3 tablets (1500mg ) by mouth 2 times daily, immediately after food, take on days of radiation only, M-F 180 tablet 0  . COMBIGAN 0.2-0.5 % ophthalmic solution Place 1 drop into the left eye 2 (two) times daily.     Marland Kitchen diltiazem (CARDIZEM CD) 180 MG 24 hr capsule Take 180 mg by mouth daily.    Marland Kitchen diltiazem (TIAZAC) 180 MG 24 hr capsule     . donepezil (ARICEPT) 5 MG tablet Take 5 mg by mouth at bedtime.    . dorzolamide (TRUSOPT) 2 % ophthalmic solution     .  gabapentin (NEURONTIN) 100 MG capsule Take 100 mg by mouth 2 (two) times daily.    . hydrocortisone (ANUSOL-HC) 25 MG suppository Place 25 mg rectally 2 (two) times daily as needed for hemorrhoids or anal itching.    Marland Kitchen HYDROmorphone (DILAUDID) 2 MG tablet Take 1 tablet (2 mg total) by mouth every 6 (six) hours as needed for severe pain. 60 tablet 0  . ibuprofen (ADVIL,MOTRIN) 400 MG tablet Take 1 tablet (400 mg total) by mouth every 8 (eight) hours as needed for moderate pain. 60 tablet 0  . lamoTRIgine (LAMICTAL) 25 MG tablet Take 25-50 mg by mouth See admin instructions. Take 25 mg by mouth in the morning and take 50 mg by mouth in the evening     . latanoprost (XALATAN) 0.005 % ophthalmic solution Place 1 drop into the left eye at bedtime.     . Lidocaine (ASPERCREME LIDOCAINE) 4 % PTCH Apply 1 patch topically daily as needed (for lower back / right hip pain).    . LORazepam (ATIVAN) 0.5 MG tablet Take 0.5 mg by mouth See admin instructions. Take 0.5 mg by mouth twice daily. Take 0.5 mg by mouth twice daily as needed for anxiety    . LORazepam (ATIVAN) 1 MG tablet     . meclizine (ANTIVERT) 25 MG tablet Take 25 mg by mouth 2 (two) times daily as needed for dizziness.    . megestrol (MEGACE ES) 625 MG/5ML suspension Take 5 mLs (625 mg total) by mouth daily. 150 mL 2  . Melatonin 5 MG TABS Take 5 mg by mouth at bedtime.    . memantine (NAMENDA) 5 MG tablet Take 5 mg by mouth daily.    . mirabegron ER (MYRBETRIQ) 50 MG TB24 tablet Take 50 mg by mouth daily.    . Multiple Vitamins-Minerals (MULTIVITAMINS THER. W/MINERALS) TABS tablet Take 1 tablet by mouth daily.    . Multiple Vitamins-Minerals (PRESERVISION AREDS) CAPS Take 1 capsule by mouth daily.    Marland Kitchen omeprazole (PRILOSEC) 20 MG capsule Take 20 mg by mouth daily.    . ondansetron (ZOFRAN) 4 MG tablet Take 4 mg by mouth every 4 (four) hours as needed for nausea or vomiting.    . ondansetron (ZOFRAN) 8 MG tablet Take 1 tablet (8 mg total) by mouth 2 (two) times daily as needed (Nausea or vomiting). 30 tablet 1  . phenylephrine-shark liver oil-mineral oil-petrolatum (PREPARATION H) 0.25-3-14-71.9 % rectal ointment Place 1 application rectally 4 (four) times daily as needed for hemorrhoids.    . polyethylene glycol (MIRALAX / GLYCOLAX) packet Take 17 g by mouth daily as needed for moderate constipation.    . pravastatin (PRAVACHOL) 40 MG tablet Take 40 mg by mouth daily.     Marland Kitchen PREMPRO 0.625-2.5 MG tablet Take 1 tablet by mouth daily.   3  . Probiotic Product (ALIGN) 4 MG CAPS Take 4 mg by mouth daily.     . prochlorperazine (COMPAZINE) 10 MG tablet Take 1 tablet (10 mg total) by mouth every  6 (six) hours as needed (Nausea or vomiting). 30 tablet 1  . trimethoprim (TRIMPEX) 100 MG tablet Take 100 mg by mouth daily.    . Wheat Dextrin (BENEFIBER PO) Take 1 each by mouth See admin instructions. Mix 1 tablespoon twice daily.     No current facility-administered medications for this visit.     PHYSICAL EXAMINATION: ECOG PERFORMANCE STATUS: 3 - Symptomatic, >50% confined to bed  Vitals:   10/12/18 1457  BP: 130/65  Pulse:  75  Resp: 17  Temp: 98.1 F (36.7 C)  SpO2: 95%   Filed Weights   10/12/18 1457  Weight: 162 lb 11.2 oz (73.8 kg)    GENERAL:alert, no distress and comfortable SKIN: skin color, texture, turgor are normal, no rashes or significant lesions EYES: normal, Conjunctiva are pink and non-injected, sclera clear OROPHARYNX:no exudate, no erythema and lips, buccal mucosa, and tongue normal  NECK: supple, thyroid normal size, non-tender, without nodularity LYMPH:  no palpable lymphadenopathy in the cervical, axillary or inguinal LUNGS: clear to auscultation and percussion with normal breathing effort HEART: regular rate & rhythm and no murmurs and no lower extremity edema ABDOMEN:abdomen soft, non-tender and normal bowel sounds Musculoskeletal:no cyanosis of digits and no clubbing  NEURO: alert & oriented x 3 with fluent speech, no focal motor/sensory deficits  LABORATORY DATA:  I have reviewed the data as listed CBC Latest Ref Rng & Units 10/12/2018 10/05/2018 09/28/2018  WBC 4.0 - 10.5 K/uL 8.0 13.1(H) 19.2(H)  Hemoglobin 12.0 - 15.0 g/dL 12.6 13.4 13.6  Hematocrit 36.0 - 46.0 % 38.0 40.1 41.2  Platelets 150 - 400 K/uL 125(L) 148(L) 175     CMP Latest Ref Rng & Units 10/12/2018 10/05/2018 09/28/2018  Glucose 70 - 99 mg/dL 77 86 147(H)  BUN 8 - 23 mg/dL 12 12 17   Creatinine 0.44 - 1.00 mg/dL 0.87 0.83 0.97  Sodium 135 - 145 mmol/L 142 141 140  Potassium 3.5 - 5.1 mmol/L 3.5 3.8 3.7  Chloride 98 - 111 mmol/L 111 111 112(H)  CO2 22 - 32 mmol/L 24 23 21(L)    Calcium 8.9 - 10.3 mg/dL 9.0 9.2 9.0  Total Protein 6.5 - 8.1 g/dL 6.1(L) 6.2(L) 6.3(L)  Total Bilirubin 0.3 - 1.2 mg/dL 0.3 0.5 0.5  Alkaline Phos 38 - 126 U/L 44 49 46  AST 15 - 41 U/L 16 14(L) 10(L)  ALT 0 - 44 U/L 16 12 10       RADIOGRAPHIC STUDIES: I have personally reviewed the radiological images as listed and agreed with the findings in the report. No results found.   ASSESSMENT & PLAN:  Bethany Oconnor is a 78 y.o. female with    1.Anal Squamous Cell Carcinoma, cT1N1aM0 stage IIIA -Diagnosed in 07/2018. S/p surgical resection with close margins. -Her staging PET scan was negative for distant metastasis, but showed hypermetabolic right inguinal lymph node, and a biopsy confirmed squamous cell carcinoma. -She was not a good candidate for 5-fu/mitomycin chemo with radiation.She started concurrent chemoRT with Xeloda 1500mg  BID M-F on 09/21/18. She tolerated moderately well with intermittent diarrhea, nausea and rectal pain.  -Patient experienced severe diarrhea. I discussed side effects will increase as she continues treatment. I discussed the option of holding Xeloda and continue radiation alone. Both pt and her POA opted to stop Xeloda given her side effects.  -Her diarrhea is now controlled with Imodium now that she is off Xeloda. She was also started on Dilaudid by Dr. Lisbeth Renshaw last week for her rectal pain. This has helped to relieve her pain, but she is not taking it consistently  -Pt is upset and irritable today and wants to stop her radiation due to the pain. -We discussed pain management, I recommend her to take Dilaudid 2 mg at 8am and 8pm, then use 2mg  every 6 hours as needed in between, I will fax the order to her nursing home  -I strongly encouraged her to continue radiation, as long as she can.  If we stop radiation now,  her cancer will recur. -After a lengthy discussion with patient and POA, she decided to continue radiation this week, continue to control pain and  will follow up each week to discuss if she is willing to proceed with treatment that week. Will hold treatments on days she cannot tolerate.  -Labs reviewed, PLTs at 125K today, CMP is still pending.  -F/u in 1 week.   2. Chroniclymphocyticleukemia (CLL), stage 0 -Diagnosed in 2015. Currently under observationand has not required any treatment -Her ALC has been trending up, but also fluctuates, not sure if it's related to her recent anal cancer and surgery  -she has no B symptoms, noanemia or thrombocytopenia,will continuemonitoring. -Labs reviewed, Her Leukemia is improving from cancer treatment. Today PLT at 125K, CMP is still pending.   3. HTN -f/u with PCP -controlled   4. Atrial Fibrillation -f/u with cardiology, stable.   5. Immunizations -Continue to stay up to date with immunizations.  6. Dementia -Currently on Donepezil. Will continue. -She has a caregiver andtwoPOAs.  7. Nausea with low appetite, weight loss, diarrhea -Likely related to anal cancer and treatment.  -Continue tof/u with dietician  -Stable mild nausea. She is eating adequately and her weight is trending up.  -Diarrhea is controlled with Imodium after stopping Xeloda.  -overall much improved after she came off Xeloda   8. Depression, Anxiety, irritability -She remains angry and emotional about her condition, diagnosis and treatment -She repeatedly expresses her wishes to die and join her husband in heaven. -Continue Lorazepam as needed -Continue to follow up with Psychiatrist.   9. Rectal pain  -secondary to radiation and chemo - I recommend her to take Dilaudid 2 mg at 8am and 8pm, then use 2mg  every 6 hours as needed in between, I will fax the order to her nursing home   Plan -Continue radiation, her caregiver will call us if she again refuses radiation --Increase Dilaudid medication, I faxed the order to her nursing home -f/u and see her before her radiation in 1 week  No  problem-specific Assessment & Plan notes found for this encounter.   No orders of the defined types were placed in this encounter.  All questions were answered. The patient knows to call the clinic with any problems, questions or concerns. No barriers to learning was detected. I spent 20 minutes counseling the patient face to face. The total time spent in the appointment was 25 minutes and more than 50% was on counseling and review of test results     Truitt Merle, MD 10/12/2018   I, Joslyn Devon, am acting as scribe for Truitt Merle, MD.   I have reviewed the above documentation for accuracy and completeness, and I agree with the above.

## 2018-10-12 ENCOUNTER — Inpatient Hospital Stay (HOSPITAL_BASED_OUTPATIENT_CLINIC_OR_DEPARTMENT_OTHER): Payer: Medicare Other | Admitting: Hematology

## 2018-10-12 ENCOUNTER — Telehealth: Payer: Self-pay

## 2018-10-12 ENCOUNTER — Ambulatory Visit
Admission: RE | Admit: 2018-10-12 | Discharge: 2018-10-12 | Disposition: A | Discharge status: Home / Self Care | Payer: Medicare Other | Source: Ambulatory Visit | Attending: Radiation Oncology | Admitting: Radiation Oncology

## 2018-10-12 ENCOUNTER — Inpatient Hospital Stay: Payer: Medicare Other

## 2018-10-12 ENCOUNTER — Encounter: Payer: Self-pay | Admitting: Hematology

## 2018-10-12 VITALS — BP 130/65 | HR 75 | Temp 98.1°F | Resp 17 | Ht 65.0 in | Wt 162.7 lb

## 2018-10-12 DIAGNOSIS — I10 Essential (primary) hypertension: Secondary | ICD-10-CM

## 2018-10-12 DIAGNOSIS — G893 Neoplasm related pain (acute) (chronic): Secondary | ICD-10-CM | POA: Diagnosis not present

## 2018-10-12 DIAGNOSIS — C210 Malignant neoplasm of anus, unspecified: Secondary | ICD-10-CM

## 2018-10-12 DIAGNOSIS — C774 Secondary and unspecified malignant neoplasm of inguinal and lower limb lymph nodes: Secondary | ICD-10-CM

## 2018-10-12 DIAGNOSIS — C9110 Chronic lymphocytic leukemia of B-cell type not having achieved remission: Secondary | ICD-10-CM

## 2018-10-12 DIAGNOSIS — R197 Diarrhea, unspecified: Secondary | ICD-10-CM

## 2018-10-12 DIAGNOSIS — R110 Nausea: Secondary | ICD-10-CM

## 2018-10-12 DIAGNOSIS — I4891 Unspecified atrial fibrillation: Secondary | ICD-10-CM

## 2018-10-12 LAB — CMP (CANCER CENTER ONLY)
ALT: 16 U/L (ref 0–44)
AST: 16 U/L (ref 15–41)
Albumin: 3.3 g/dL — ABNORMAL LOW (ref 3.5–5.0)
Alkaline Phosphatase: 44 U/L (ref 38–126)
Anion gap: 7 (ref 5–15)
BUN: 12 mg/dL (ref 8–23)
CO2: 24 mmol/L (ref 22–32)
Calcium: 9 mg/dL (ref 8.9–10.3)
Chloride: 111 mmol/L (ref 98–111)
Creatinine: 0.87 mg/dL (ref 0.44–1.00)
Glucose, Bld: 77 mg/dL (ref 70–99)
Potassium: 3.5 mmol/L (ref 3.5–5.1)
Sodium: 142 mmol/L (ref 135–145)
TOTAL PROTEIN: 6.1 g/dL — AB (ref 6.5–8.1)
Total Bilirubin: 0.3 mg/dL (ref 0.3–1.2)

## 2018-10-12 LAB — CBC WITH DIFFERENTIAL/PLATELET
Abs Immature Granulocytes: 0.09 10*3/uL — ABNORMAL HIGH (ref 0.00–0.07)
Basophils Absolute: 0 10*3/uL (ref 0.0–0.1)
Basophils Relative: 0 %
Eosinophils Absolute: 0.2 10*3/uL (ref 0.0–0.5)
Eosinophils Relative: 3 %
HCT: 38 % (ref 36.0–46.0)
Hemoglobin: 12.6 g/dL (ref 12.0–15.0)
IMMATURE GRANULOCYTES: 1 %
Lymphocytes Relative: 47 %
Lymphs Abs: 3.8 10*3/uL (ref 0.7–4.0)
MCH: 34 pg (ref 26.0–34.0)
MCHC: 33.2 g/dL (ref 30.0–36.0)
MCV: 102.4 fL — AB (ref 80.0–100.0)
Monocytes Absolute: 0.7 10*3/uL (ref 0.1–1.0)
Monocytes Relative: 9 %
NEUTROS PCT: 40 %
Neutro Abs: 3.2 10*3/uL (ref 1.7–7.7)
Platelets: 125 10*3/uL — ABNORMAL LOW (ref 150–400)
RBC: 3.71 MIL/uL — ABNORMAL LOW (ref 3.87–5.11)
RDW: 16.6 % — AB (ref 11.5–15.5)
WBC: 8 10*3/uL (ref 4.0–10.5)
nRBC: 0 % (ref 0.0–0.2)

## 2018-10-12 NOTE — Telephone Encounter (Signed)
Faxed order to [facility] (clinic nurse)  at [facility] for patient to have Dilaudid 2 mg po at 8:00 am and 8:00 pm, then every 6 hours in between prn if needed to [phone]

## 2018-10-13 ENCOUNTER — Ambulatory Visit
Admission: RE | Admit: 2018-10-13 | Discharge: 2018-10-13 | Disposition: A | Discharge status: Home / Self Care | Payer: Medicare Other | Source: Ambulatory Visit | Attending: Radiation Oncology | Admitting: Radiation Oncology

## 2018-10-13 ENCOUNTER — Encounter: Payer: Self-pay | Admitting: Internal Medicine

## 2018-10-13 ENCOUNTER — Non-Acute Institutional Stay (SKILLED_NURSING_FACILITY): Payer: Medicare Other | Admitting: Internal Medicine

## 2018-10-13 ENCOUNTER — Telehealth: Payer: Self-pay | Admitting: Hematology

## 2018-10-13 ENCOUNTER — Encounter: Payer: Self-pay | Admitting: Radiation Oncology

## 2018-10-13 DIAGNOSIS — F4329 Adjustment disorder with other symptoms: Secondary | ICD-10-CM

## 2018-10-13 DIAGNOSIS — F339 Major depressive disorder, recurrent, unspecified: Secondary | ICD-10-CM

## 2018-10-13 DIAGNOSIS — F02818 Dementia in other diseases classified elsewhere, unspecified severity, with other behavioral disturbance: Secondary | ICD-10-CM

## 2018-10-13 DIAGNOSIS — C9110 Chronic lymphocytic leukemia of B-cell type not having achieved remission: Secondary | ICD-10-CM

## 2018-10-13 DIAGNOSIS — F4321 Adjustment disorder with depressed mood: Secondary | ICD-10-CM

## 2018-10-13 DIAGNOSIS — H543 Unqualified visual loss, both eyes: Secondary | ICD-10-CM | POA: Diagnosis not present

## 2018-10-13 DIAGNOSIS — G301 Alzheimer's disease with late onset: Secondary | ICD-10-CM

## 2018-10-13 DIAGNOSIS — F4381 Prolonged grief disorder: Secondary | ICD-10-CM

## 2018-10-13 DIAGNOSIS — F0281 Dementia in other diseases classified elsewhere with behavioral disturbance: Secondary | ICD-10-CM

## 2018-10-13 DIAGNOSIS — C210 Malignant neoplasm of anus, unspecified: Secondary | ICD-10-CM

## 2018-10-13 NOTE — Progress Notes (Signed)
Patient ID: Bethany Oconnor, female   DOB: May 20, 1941, 77 y.o.   MRN: [id number]  Provider:  Rexene Edison. Mariea Clonts, D.O., C.M.D. Location:  [place] Room Number: [place] of Service:  SNF (31)  PCP: Crist Infante, MD Patient Care Team: Crist Infante, MD as PCP - General (Internal Medicine) Sherren Mocha, MD as PCP - Cardiology (Cardiology) Crist Infante, MD (Internal Medicine) Newton Pigg, MD as Consulting Physician (Obstetrics and Gynecology) Truitt Merle, MD as Consulting Physician (Hematology) Leta Baptist, MD as Consulting Physician (Otolaryngology) Sherlynn Stalls, MD as Consulting Physician (Ophthalmology) Regal, Tamala Fothergill, DPM as Consulting Physician (Podiatry) Harriett Sine, MD as Consulting Physician (Dermatology) Erroll Luna, MD as Consulting Physician (General Surgery)  Extended Emergency Contact Information Primary Emergency Contact: Pena, Fannin Phone: [phone] Work Phone: [phone] Relation: None Secondary Emergency Contact: Dorris Singh Mobile Phone: [phone] Relation: Other  Code Status: DNR Goals of Care: Advanced Directive information Advanced Directives 10/13/2018  Does Patient Have a Medical Advance Directive? Yes  Type of Advance Directive Out of facility DNR (pink MOST or yellow form);Colon;Living will  Does patient want to make changes to medical advance directive? No - Patient declined  Copy of [place] in Chart? Yes - validated most recent copy scanned in chart (See row information)  Would patient like information on creating a medical advance directive? -  Pre-existing out of facility DNR order (yellow form or pink MOST form) Yellow form placed in chart (order not valid for inpatient use)   Chief Complaint  Patient presents with  . New Admit To SNF    Rehab admission    HPI: Patient is a 78 y.o. female with h/o CLL, dementia, bipolar disorder,  low vision from macular degeneration, htn, hearing loss, afib, prior herniated disc s/p surgery last year, and most recently anal cancer seen today for admission to [facility] rehab on 10/06/2018 due to weakness and increased pain [place] and XRT for her anal cancer.  Bethany Oconnor has had a rough year with back surgery and loss of her husband.  She has a h/o bipolar disorder and meds have just been adjusted (lamictal stopped and wellbutrin increased--it did help her before; lorazepam was also added--these changes by psychiatry).  She is currently fixating on her husband's death and discussion about her goals of care could not make it past the fact that he is dead and she cried and cried.  She has three different photos of her deceased husband in her rehab room and keeps looking at them.  She says her children want her to do treatments and she cannot tell them no.    Review of oncology events indicates she had a wide excision of the anal cancer 07/22/2018.  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  ChemoRT with xeloda 1500mg  po bid Monday thru Fri was started 09/21/18, but 2/3 xeloda was stopped due to pt's diarrhea.  She is following with Dr. Burr Medico (oncology) and Dr. Lisbeth Renshaw (radiation oncology).  Her chemoRT is to be completed 2/28.  Dilaudid was started for her pain at 2mg  at 8am and 8pm and 2mg  every 6 hrs as needed for breakthrough pain.  She reports this has been a tremendous help.  It appears an extensive discussion was held at oncology with pt, caregiver and family and decision was made 2/10 to continue with weekly meetings to see how she is tolerating the XRT and she can  decide at any time to stop.  She is still eating well and has gained weight (8-9 lbs since dec).  Past Medical History:  Diagnosis Date  . Anxiety   . Arthritis   . Cerebral aneurysm    s/p endovascular colling 2008  . CLL (chronic lymphocytic leukemia) (HCC)    CLL  . Depression    . Hearing loss   . Macular degeneration   . Neuropathy   . Paroxysmal atrial fibrillation (HCC)   . Pneumonia    08-04-17  . PONV (postoperative nausea and vomiting)   . Thyroid disease    Past Surgical History:  Procedure Laterality Date  . ANEURYSM COILING    . CATARACT EXTRACTION     bilateral  . CERVICAL CONE BIOPSY    . COLONOSCOPY    . EVALUATION UNDER ANESTHESIA WITH HEMORRHOIDECTOMY N/A 09/03/2017   Procedure: EXAM UNDER ANESTHESIA, EXCISION OF ANAL LESION, POSSIBLE HEMORRHOIDECTOMY;  Surgeon: Ileana Roup, MD;  Location: [facility];  Service: General;  Laterality: N/A;  . LESION REMOVAL N/A 07/22/2018   Procedure: EXCISION OF PERIANAL LESION;  Surgeon: Ileana Roup, MD;  Location: [facility];  Service: General;  Laterality: N/A;  . LUMBAR LAMINECTOMY/DECOMPRESSION MICRODISCECTOMY Right 04/07/2018   Procedure: Right Lumbar Four-Five Lumbar Five-Sacral One Laminectomy/Microdiscectomy;  Surgeon: Kristeen Miss, MD;  Location: [facility];  Service: Neurosurgery;  Laterality: Right;  Right Lumbar Four-Five Lumbar Five-Sacral One Laminectomy/Microdiscectomy  . RECTAL EXAM UNDER ANESTHESIA N/A 07/22/2018   Procedure: ANORECTAL EXAM UNDER ANESTHESIA ERAS PATHWAY;  Surgeon: Ileana Roup, MD;  Location: [facility];  Service: General;  Laterality: N/A;  . THYROIDECTOMY    . TONSILLECTOMY      reports that she quit smoking about 30 years ago. She has never used smokeless tobacco. She reports that she does not drink alcohol or use drugs. Social History   Socioeconomic History  . Marital status: Widowed    Spouse name: Not on file  . Number of children: 2  . Years of education: 16  . Highest education level: Bachelor's degree (e.g., BA, AB, BS)  Occupational History    Employer: RETIRED  Social Needs  . Financial resource strain: Not on file  . Food insecurity:    Worry: Not on file    Inability: Not on file  . Transportation needs:    Medical: No    Non-medical: No  Tobacco  Use  . Smoking status: Former Smoker    Last attempt to quit: 09/02/1988    Years since quitting: 30.1  . Smokeless tobacco: Never Used  Substance and Sexual Activity  . Alcohol use: No    Alcohol/week: 1.0 - 2.0 standard drinks    Types: 1 - 2 Glasses of wine per week    Comment: daily for 50 years  No longer drinks 09-01-17  . Drug use: No  . Sexual activity: Not Currently  Lifestyle  . Physical activity:    Days per week: Not on file    Minutes per session: Not on file  . Stress: Not on file  Relationships  . Social connections:    Talks on phone: Not on file    Gets together: Not on file    Attends religious service: Not on file    Active member of club or organization: Not on file    Attends meetings of clubs or organizations: Not on file    Relationship status: Not on file  . Intimate partner violence:    Fear of  current or ex partner: Not on file    Emotionally abused: Not on file    Physically abused: Not on file    Forced sexual activity: Not on file  Other Topics Concern  . Not on file  Social History Narrative   Lives at [facility] [facility].  Retired Agricultural engineer.  Education: college.  2 children.    Functional Status Survey:    Family History  Problem Relation Age of Onset  . Cancer Father 59       died  . Cerebral aneurysm Mother 52       died  . Heart attack Brother     Health Maintenance  Topic Date Due  . FOOT EXAM  02/12/1949  . OPHTHALMOLOGY EXAM  02/12/1949  . HEMOGLOBIN A1C  05/14/2018  . URINE MICROALBUMIN  11/12/2018  . TETANUS/TDAP  12/20/2026  . INFLUENZA VACCINE  Completed  . DEXA SCAN  Completed  . PNA vac Low Risk Adult  Completed    Allergies  Allergen Reactions  . Ambien [Zolpidem Tartrate] Other (See Comments)    UNSPECIFIED REACTION   . Fioricet-Codeine [Butalbital-Apap-Caff-Cod] Other (See Comments)    UNSPECIFIED REACTION   . Macrobid [Nitrofurantoin] Other (See Comments)    UNSPECIFIED REACTION   . Codeine Other (See Comments)      About 50 years ago, took Codeine for scratched cornea while pregnant, and it made her extremely sedated.  . Hydrocodone-Acetaminophen Nausea And Vomiting  . Keflex [Cephalexin] Rash  . Percocet [Oxycodone-Acetaminophen] Nausea And Vomiting    Outpatient Encounter Medications as of 10/13/2018  Medication Sig  . acetaminophen (TYLENOL) 500 MG tablet Take 500 mg by mouth every 8 (eight) hours.   Marland Kitchen buPROPion (WELLBUTRIN XL) 150 MG 24 hr tablet Take 150 mg by mouth daily. Take with the 300mg  tablet  . buPROPion (WELLBUTRIN XL) 300 MG 24 hr tablet Take 450 mg by mouth daily. Take with the 150mg  tablet  . Calcium Carbonate-Vitamin D 600-400 MG-UNIT per tablet Take 1 tablet by mouth daily.  . COMBIGAN 0.2-0.5 % ophthalmic solution Place 1 drop into the left eye 2 (two) times daily.   Marland Kitchen diltiazem (CARDIZEM CD) 180 MG 24 hr capsule Take 180 mg by mouth daily.  Marland Kitchen donepezil (ARICEPT) 5 MG tablet Take 5 mg by mouth at bedtime.  . dorzolamide (TRUSOPT) 2 % ophthalmic solution   . gabapentin (NEURONTIN) 100 MG capsule Take 100 mg by mouth 2 (two) times daily.  . hydrocortisone (ANUSOL-HC) 25 MG suppository Place 25 mg rectally 2 (two) times daily as needed for hemorrhoids or anal itching.  Marland Kitchen HYDROmorphone (DILAUDID) 2 MG tablet Take 1 tablet (2 mg total) by mouth every 6 (six) hours as needed for severe pain.  Marland Kitchen HYDROmorphone (DILAUDID) 2 MG tablet Take 2 mg by mouth 2 (two) times daily. 8am & 8pm  . ibuprofen (ADVIL,MOTRIN) 400 MG tablet Take 1 tablet (400 mg total) by mouth every 8 (eight) hours as needed for moderate pain.  Marland Kitchen lamoTRIgine (LAMICTAL) 100 MG tablet Take 100 mg by mouth at bedtime.  Marland Kitchen latanoprost (XALATAN) 0.005 % ophthalmic solution Place 1 drop into the left eye at bedtime.   . Lidocaine (ASPERCREME LIDOCAINE) 4 % PTCH Apply 1 patch topically daily as needed (for lower back / right hip pain).  . LORazepam (ATIVAN) 0.5 MG tablet Take 0.5 mg by mouth daily as needed.   Marland Kitchen LORazepam  (ATIVAN) 1 MG tablet Take 1 mg by mouth 2 (two) times daily.   . meclizine (ANTIVERT)  25 MG tablet Take 25 mg by mouth 2 (two) times daily as needed for dizziness.  . Melatonin 5 MG TABS Take 5 mg by mouth at bedtime.  . memantine (NAMENDA) 5 MG tablet Take 5 mg by mouth daily.  . mirabegron ER (MYRBETRIQ) 50 MG TB24 tablet Take 50 mg by mouth daily.  . Multiple Vitamins-Minerals (PRESERVISION AREDS) CAPS Take 1 capsule by mouth daily.  . ondansetron (ZOFRAN) 8 MG tablet Take 1 tablet (8 mg total) by mouth 2 (two) times daily as needed (Nausea or vomiting).  . polyethylene glycol (MIRALAX / GLYCOLAX) packet Take 17 g by mouth daily as needed for moderate constipation.  . pravastatin (PRAVACHOL) 40 MG tablet Take 40 mg by mouth daily.   . Probiotic Product (ALIGN) 4 MG CAPS Take 4 mg by mouth daily.   . prochlorperazine (COMPAZINE) 10 MG tablet Take 1 tablet (10 mg total) by mouth every 6 (six) hours as needed (Nausea or vomiting).  . trimethoprim (TRIMPEX) 100 MG tablet Take 100 mg by mouth daily.  . Wheat Dextrin (BENEFIBER PO) Take 1 each by mouth See admin instructions. Mix 1 tablespoon twice daily.  . [DISCONTINUED] lamoTRIgine (LAMICTAL) 25 MG tablet Take 25-50 mg by mouth See admin instructions. Take 25 mg by mouth in the morning and take 50 mg by mouth in the evening  . [DISCONTINUED] capecitabine (XELODA) 500 MG tablet Take 3 tablets (1500mg ) by mouth 2 times daily, immediately after food, take on days of radiation only, M-F  . [DISCONTINUED] diltiazem (TIAZAC) 180 MG 24 hr capsule   . [DISCONTINUED] megestrol (MEGACE ES) 625 MG/5ML suspension Take 5 mLs (625 mg total) by mouth daily.  . [DISCONTINUED] Multiple Vitamins-Minerals (MULTIVITAMINS THER. W/MINERALS) TABS tablet Take 1 tablet by mouth daily.  . [DISCONTINUED] omeprazole (PRILOSEC) 20 MG capsule Take 20 mg by mouth daily.  . [DISCONTINUED] ondansetron (ZOFRAN) 4 MG tablet Take 4 mg by mouth every 4 (four) hours as needed for  nausea or vomiting.  . [DISCONTINUED] phenylephrine-shark liver oil-mineral oil-petrolatum (PREPARATION H) 0.25-3-14-71.9 % rectal ointment Place 1 application rectally 4 (four) times daily as needed for hemorrhoids.  . [DISCONTINUED] PREMPRO 0.625-2.5 MG tablet Take 1 tablet by mouth daily.    No facility-administered encounter medications on file as of 10/13/2018.     Review of Systems  Constitutional: Positive for activity change and fatigue. Negative for appetite change, chills, fever and unexpected weight change.  HENT: Positive for hearing loss. Negative for congestion.   Eyes:       Poor vision  Respiratory: Negative for chest tightness and shortness of breath.   Cardiovascular: Negative for chest pain, palpitations and leg swelling.  Gastrointestinal: Negative for abdominal pain, diarrhea, nausea and vomiting.  Genitourinary: Negative for dysuria and frequency.  Musculoskeletal: Positive for gait problem. Negative for arthralgias and back pain.       Using manual wheelchair right now; reports pain is controlled  Skin: Positive for pallor.  Neurological: Positive for weakness. Negative for dizziness.  Psychiatric/Behavioral: Positive for agitation and confusion. Negative for behavioral problems, sleep disturbance and suicidal ideas. The patient is nervous/anxious.        Depression and prolonged grief    Vitals:   10/13/18 1058  BP: 126/72  Pulse: 70  Resp: 18  Temp: 97.8 F (36.6 C)  TempSrc: Oral  SpO2: 92%  Weight: 162 lb (73.5 kg)  Height: 5\' 5"  (1.651 m)   Body mass index is 26.96 kg/m. Physical Exam Vitals signs and nursing  note reviewed.  Constitutional:      Appearance: Normal appearance.  HENT:     Head: Normocephalic and atraumatic.     Ears:     Comments: HOH    Nose: Nose normal.     Mouth/Throat:     Mouth: Mucous membranes are moist.     Pharynx: Oropharynx is clear.  Eyes:     Conjunctiva/sclera: Conjunctivae normal.     Pupils: Pupils are  equal, round, and reactive to light.     Comments: Poor vision, unable to recognize me (but also has memory loss)  Cardiovascular:     Rate and Rhythm: Rhythm irregular.     Pulses: Normal pulses.     Heart sounds: Murmur present.  Pulmonary:     Effort: Pulmonary effort is normal.     Breath sounds: Normal breath sounds.  Abdominal:     General: Bowel sounds are normal. There is no distension.     Palpations: Abdomen is soft. There is no mass.     Tenderness: There is no abdominal tenderness.  Musculoskeletal: Normal range of motion.     Comments: Using manual wheelchair; had been walking with rollator previously  Skin:    General: Skin is warm and dry.     Coloration: Skin is pale.  Neurological:     General: No focal deficit present.     Mental Status: She is alert.  Psychiatric:     Comments: Agitated and tearful at the same time     Labs reviewed: Basic Metabolic Panel: Recent Labs    09/28/18 1423 10/05/18 1350 10/12/18 1423  NA 140 141 142  K 3.7 3.8 3.5  CL 112* 111 111  CO2 21* 23 24  GLUCOSE 147* 86 77  BUN 17 12 12   CREATININE 0.97 0.83 0.87  CALCIUM 9.0 9.2 9.0   Liver Function Tests: Recent Labs    09/28/18 1423 10/05/18 1350 10/12/18 1423  AST 10* 14* 16  ALT 10 12 16   ALKPHOS 46 49 44  BILITOT 0.5 0.5 0.3  PROT 6.3* 6.2* 6.1*  ALBUMIN 3.5 3.6 3.3*   No results for input(s): LIPASE, AMYLASE in the last 8760 hours. No results for input(s): AMMONIA in the last 8760 hours. CBC: Recent Labs    09/28/18 1423 10/05/18 1350 10/12/18 1423  WBC 19.2* 13.1* 8.0  NEUTROABS 4.4 3.1 3.2  HGB 13.6 13.4 12.6  HCT 41.2 40.1 38.0  MCV 100.7* 100.5* 102.4*  PLT 175 148* 125*   Cardiac Enzymes: No results for input(s): CKTOTAL, CKMB, CKMBINDEX, TROPONINI in the last 8760 hours. BNP: Invalid input(s): POCBNP Lab Results  Component Value Date   HGBA1C 5.6 11/11/2017   Lab Results  Component Value Date   TSH 1.78 11/11/2017   Lab Results    Component Value Date   VITAMINB12 285 10/02/2018   Lab Results  Component Value Date   FOLATE >24.0 10/02/2018   Reviewed oncology notes  Assessment/Plan 1. Anal cancer ([place]) -currently receiving XRT alone and pt not sure she wants to continue -Dr. Burr Medico and team are readdressing weekly based on how she is feeling and tolerating it -pain is controlled at present and mood seems to be biggest current struggle  2. Late onset Alzheimer's disease with behavioral disturbance ([place]) -need latest MMSE result--unfortunately, last rehab stay's are not documented in epic -clearly declining cognitively--remains on aricept and low dose of namenda  3. Low vision, both eyes -legally blind, ongoing, as well, was staying [place] with  caregivers due to this and her worsening cognitive status  4. Grief reaction with prolonged bereavement -much worse again right now, but it's just been a year since Joe passed away here in rehab--has seen psych and meds were just adjusted -she seems back to where she was at admission in 8/19--I had put here on cymbalta then (which got promptly stopped by someone else) and she was on lamictal and wellbutrin XL 450mg    5. Depression, recurrent ([place]) -more severe; had been well controlled by end of last rehab stay, but seems that her recent anal cancer diagnosis has caused this to worsen -also just taken off of lamictal mood stabilizer--I thought her chart had bipolar disorder last admission, but it's gone now -she is currently on a higher dose of wellbutrin (did help before) and lorazepam for anxiety -is perseverating on husband's death a year ago  6. CLL (chronic lymphocytic leukemia) ([place]) -not active  Family/ staff Communication: discussed with snf nurse  Labs/tests ordered:  No new  Need Dr. Silvestre Mesi last note   Terrisha Lopata L. Jabez Molner, D.O. [facility] [address], [place] [address] Cell Phone (Mon-Fri 8am-5pm):   [phone] On Call:  [phone] & follow prompts after 5pm & weekends Office Phone:  [phone] Office Fax:  [phone]

## 2018-10-13 NOTE — Telephone Encounter (Signed)
Called patient to inform patient of her scheduled appt.  No answer will try calling back at a later time.  Sent a staff message to get Dr. Burr Medico appt changed due to me not having access to override her lunch.

## 2018-10-14 ENCOUNTER — Ambulatory Visit: Admission: RE | Admit: 2018-10-14 | Payer: Medicare Other | Source: Ambulatory Visit

## 2018-10-15 ENCOUNTER — Telehealth: Payer: Self-pay | Admitting: Radiation Oncology

## 2018-10-15 ENCOUNTER — Ambulatory Visit: Payer: Medicare Other

## 2018-10-15 ENCOUNTER — Telehealth: Payer: Self-pay | Admitting: *Deleted

## 2018-10-15 NOTE — Telephone Encounter (Signed)
Spoke with the RN at [facility] on behalf of the patient.  I let her know that we are making some changes to her dilaudid dosage and I was sending over a new prescription.  I verified the fax number [phone]).  Will continue to follow as necessary.  Gloriajean Dell. Leonie Green, BSN

## 2018-10-15 NOTE — Telephone Encounter (Signed)
I spoke with Carloyn Manner, the patient's HCPOA again after we met and spoke yesterday. The patient's dementia is worsening in the last month, she has become more depressed per caregivers and HCPOAs and reports wanting her life to be over. Apparently this predates her cancer diagnosis as a result of her husband's passing a few years ago. She is confused and did not know why she was at treatment yesterday. After discussing the risks of ending treatment and the concerns for tumor progression, possiblities of tumor developing resistence due to incomplete radiotherapy, and the risk of needing exent procedures and more chemotherapy if her tumor progressed from discontinuing treatment early, and also weighing the risks of the patient having other comorbidities and risks of developing emoblic events from inactivity in the setting of cancer, the pt's HCPOA would like to discontinue treatment. Staff is notified. Her treatment will be on hold before formally cancelling so she can also be seen with palliative care on Monday. She will meet with Dr. Burr Medico as well that day.

## 2018-10-16 ENCOUNTER — Ambulatory Visit: Payer: Medicare Other

## 2018-10-16 NOTE — Progress Notes (Signed)
[name]   [phone] [phone]   Clinic Follow up Note   Patient Care Team: Crist Infante, MD as PCP - General (Internal Medicine) Sherren Mocha, MD as PCP - Cardiology (Cardiology) Crist Infante, MD (Internal Medicine) Newton Pigg, MD as Consulting Physician (Obstetrics and Gynecology) Truitt Merle, MD as Consulting Physician (Hematology) Leta Baptist, MD as Consulting Physician (Otolaryngology) Sherlynn Stalls, MD as Consulting Physician (Ophthalmology) Regal, Tamala Fothergill, DPM as Consulting Physician (Podiatry) Harriett Sine, MD as Consulting Physician (Dermatology) Erroll Luna, MD as Consulting Physician (General Surgery)  Date of Service:  10/19/2018  CHIEF COMPLAINT: F/u of anal cancer  SUMMARY OF ONCOLOGIC HISTORY: Oncology History   Cancer Staging Anal cancer [facility]) Staging form: Anus, AJCC 8th Edition - Clinical stage from 08/12/2018: Stage IIIA (cT1, cN1a, cM0) - Signed by Truitt Merle, MD on 08/25/2018 - Pathologic: No stage assigned - Unsigned       Anal cancer ([place])   07/22/2018 Surgery    Wide local excision of anal margin lesion (SCC) and hemorrhoidectomy by Dr. Dema Severin     07/22/2018 Pathology Results    Anus, biopsy, marginal lesion - INVASIVE SQUAMOUS CELL CARCINOMA, MODERATELY DIFFERENTIATED, SPANNING 0.7 CM. - HIGH GRADE ANAL INTRAEPITHELIAL NEOPLASIA (AIN-III). - INVASIVE CARCINOMA IS BROADLY LESS THAN 0.1 CM TO THE ANTERIOR MARGIN OF SPECIMEN 1.    07/22/2018 Initial Diagnosis    Anal cancer ([place])    08/12/2018 Cancer Staging    Staging form: Anus, AJCC 8th Edition - Clinical stage from 08/12/2018: Stage IIIA (cT1, cN1a, cM0) - Signed by Truitt Merle, MD on 08/25/2018    08/12/2018 Imaging    PET IMPRESSION: 1. Focal hypermetabolism in the right anus consistent with known primary tumor. 2. Hypermetabolic right inguinal metastatic lymph node. No other metastatic disease identified in the neck, chest, abdomen,  or pelvis. 3. Hypermetabolic calcified left pleural plaques. These plaques were visualized on CT chest of 09/06/2014 and are unchanged in the interval. No right hemithoracic pleural plaques. Given unilateral disease, prior hemothorax or empyema would be a consideration. 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 4. 3.9 x 2.3 cm left adnexal cyst without hypermetabolic activity. Attention on follow-up recommended. 5.  Aortic Atherosclerois (ICD10-170.0)     08/21/2018 Pathology Results    Right inguinal lymph node biopsy showed metastatic squamous cell carcinoma    09/21/2018 - 10/05/2018 Chemotherapy    Concurrent chemoRT with Xeloda 1500mg  BID M-F starting 09/21/2018. Xeloda was stopped on 10/05/18 due to poor tolerance    09/21/2018 - 10/13/2018 Radiation Therapy    Concurrnet ChemoRT with Dr. Lisbeth Renshaw starting 09/21/18. Stopped early on 10/13/18 due to poor tolerance       CURRENT THERAPY:  Concurrent chemoRT with Xeloda1500mg  BID M-Fstarting 09/21/2018, Xeloda stopped on 10/05/2018 and radiation stopped on 10/13/18 due to poor tolerance.   INTERVAL HISTORY:  Bethany Oconnor is here for a follow up of radiation treatment. She only had radiation on Monday and Tuesday last week.  She presents to the clinic today with her caregivers. She notes she feels fine except her rectal/buttocks pain. She has Dilaudid at half tablet q4hours and topical cream. The pain medication was reduced by clinic. Her care giver notes she did have emesis one time.  She is at the wheelchair most of the time because she is not stable enough to stand up on her own. Sometimes she is able to walk adequately on her own if she wants to.   REVIEW OF SYSTEMS:  Constitutional: Denies fevers, chills or abnormal weight loss Eyes: Denies blurriness of vision Ears, nose, mouth, throat, and face: Denies mucositis or sore throat Respiratory: Denies cough, dyspnea or wheezes Cardiovascular: Denies  palpitation, chest discomfort or lower extremity swelling Gastrointestinal:  Denies nausea, heartburn (+) buttocks and rectal pain  Skin: Denies abnormal skin rashes MSK: (+) occasional balance issues when standing, in wheelchair  Lymphatics: Denies new lymphadenopathy or easy bruising Neurological:Denies numbness, tingling or new weaknesses Behavioral/Psych: Mood is stable, no new changes  All other systems were reviewed with the patient and are negative.  MEDICAL HISTORY:  Past Medical History:  Diagnosis Date  . Anxiety   . Arthritis   . Cerebral aneurysm    s/p endovascular colling 2008  . CLL (chronic lymphocytic leukemia) (HCC)    CLL  . Depression   . Hearing loss   . Macular degeneration   . Neuropathy   . Paroxysmal atrial fibrillation (HCC)   . Pneumonia    08-04-17  . PONV (postoperative nausea and vomiting)   . Thyroid disease     SURGICAL HISTORY: Past Surgical History:  Procedure Laterality Date  . ANEURYSM COILING    . CATARACT EXTRACTION     bilateral  . CERVICAL CONE BIOPSY    . COLONOSCOPY    . EVALUATION UNDER ANESTHESIA WITH HEMORRHOIDECTOMY N/A 09/03/2017   Procedure: EXAM UNDER ANESTHESIA, EXCISION OF ANAL LESION, POSSIBLE HEMORRHOIDECTOMY;  Surgeon: Ileana Roup, MD;  Location: [facility];  Service: General;  Laterality: N/A;  . LESION REMOVAL N/A 07/22/2018   Procedure: EXCISION OF PERIANAL LESION;  Surgeon: Ileana Roup, MD;  Location: [place];  Service: General;  Laterality: N/A;  . LUMBAR LAMINECTOMY/DECOMPRESSION MICRODISCECTOMY Right 04/07/2018   Procedure: Right Lumbar Four-Five Lumbar Five-Sacral One Laminectomy/Microdiscectomy;  Surgeon: Kristeen Miss, MD;  Location: [place];  Service: Neurosurgery;  Laterality: Right;  Right Lumbar Four-Five Lumbar Five-Sacral One Laminectomy/Microdiscectomy  . RECTAL EXAM UNDER ANESTHESIA N/A 07/22/2018   Procedure: ANORECTAL EXAM UNDER ANESTHESIA ERAS PATHWAY;  Surgeon: Ileana Roup, MD;   Location: [place];  Service: General;  Laterality: N/A;  . THYROIDECTOMY    . TONSILLECTOMY      I have reviewed the social history and family history with the patient and they are unchanged from previous note.  ALLERGIES:  is allergic to [employer] tartrate]; fioricet-codeine [butalbital-apap-caff-cod]; macrobid [nitrofurantoin]; codeine; hydrocodone-acetaminophen; keflex [cephalexin]; and percocet [oxycodone-acetaminophen].  MEDICATIONS:  Current Outpatient Medications  Medication Sig Dispense Refill  . acetaminophen (TYLENOL) 500 MG tablet Take 500 mg by mouth every 8 (eight) hours.     Marland Kitchen buPROPion (WELLBUTRIN XL) 150 MG 24 hr tablet Take 150 mg by mouth daily. Take with the 300mg  tablet    . buPROPion (WELLBUTRIN XL) 300 MG 24 hr tablet Take 450 mg by mouth daily. Take with the 150mg  tablet    . Calcium Carbonate-Vitamin D 600-400 MG-UNIT per tablet Take 1 tablet by mouth daily.    . COMBIGAN 0.2-0.5 % ophthalmic solution Place 1 drop into the left eye 2 (two) times daily.     Marland Kitchen diltiazem (CARDIZEM CD) 180 MG 24 hr capsule Take 180 mg by mouth daily.    Marland Kitchen donepezil (ARICEPT) 5 MG tablet Take 5 mg by mouth at bedtime.    . dorzolamide (TRUSOPT) 2 % ophthalmic solution     . gabapentin (NEURONTIN) 100 MG capsule Take 100 mg by mouth 2 (two) times daily.    . hydrocortisone (ANUSOL-HC) 25 MG suppository Place 25  mg rectally 2 (two) times daily as needed for hemorrhoids or anal itching.    Marland Kitchen ibuprofen (ADVIL,MOTRIN) 400 MG tablet Take 1 tablet (400 mg total) by mouth every 8 (eight) hours as needed for moderate pain. 60 tablet 0  . lamoTRIgine (LAMICTAL) 100 MG tablet Take 100 mg by mouth at bedtime.    Marland Kitchen latanoprost (XALATAN) 0.005 % ophthalmic solution Place 1 drop into the left eye at bedtime.     . Lidocaine (ASPERCREME LIDOCAINE) 4 % PTCH Apply 1 patch topically daily as needed (for lower back / right hip pain).    . LORazepam (ATIVAN) 1 MG tablet Take 1 mg by mouth 2 (two) times  daily.     . meclizine (ANTIVERT) 25 MG tablet Take 25 mg by mouth 2 (two) times daily as needed for dizziness.    . Melatonin 5 MG TABS Take 5 mg by mouth at bedtime.    . memantine (NAMENDA) 5 MG tablet Take 5 mg by mouth daily.    . mirabegron ER (MYRBETRIQ) 50 MG TB24 tablet Take 50 mg by mouth daily.    . Multiple Vitamins-Minerals (PRESERVISION AREDS) CAPS Take 1 capsule by mouth daily.    . ondansetron (ZOFRAN) 8 MG tablet Take 1 tablet (8 mg total) by mouth 2 (two) times daily as needed (Nausea or vomiting). 30 tablet 1  . polyethylene glycol (MIRALAX / GLYCOLAX) packet Take 17 g by mouth daily as needed for moderate constipation.    . pravastatin (PRAVACHOL) 40 MG tablet Take 40 mg by mouth daily.     . Probiotic Product (ALIGN) 4 MG CAPS Take 4 mg by mouth daily.     . prochlorperazine (COMPAZINE) 10 MG tablet Take 1 tablet (10 mg total) by mouth every 6 (six) hours as needed (Nausea or vomiting). 30 tablet 1  . trimethoprim (TRIMPEX) 100 MG tablet Take 100 mg by mouth daily.    . Wheat Dextrin (BENEFIBER PO) Take 1 each by mouth See admin instructions. Mix 1 tablespoon twice daily.    Marland Kitchen HYDROcodone-acetaminophen (NORCO) 5-325 MG tablet Take 1 tablet by mouth every 6 (six) hours as needed for moderate pain. 15 tablet 0  . HYDROmorphone (DILAUDID) 2 MG tablet Take 1 tablet (2 mg total) by mouth every 6 (six) hours as needed for severe pain. 60 tablet 0  . HYDROmorphone (DILAUDID) 2 MG tablet Take 2 mg by mouth 2 (two) times daily. 8am & 8pm     No current facility-administered medications for this visit.     PHYSICAL EXAMINATION: ECOG PERFORMANCE STATUS: 3 - Symptomatic, >50% confined to bed  Vitals:   10/19/18 1234  BP: 132/73  Pulse: 75  Resp: 17  Temp: 98.6 F (37 C)  SpO2: 97%   Filed Weights   10/19/18 1234  Weight: 158 lb 12.8 oz (72 kg)    GENERAL:alert, no distress and comfortable (+) in wheelchair  SKIN: skin color, texture, turgor are normal, no rashes or  significant lesions EYES: normal, Conjunctiva are pink and non-injected, sclera clear OROPHARYNX:no exudate, no erythema and lips, buccal mucosa, and tongue normal  NECK: supple, thyroid normal size, non-tender, without nodularity LYMPH:  no palpable lymphadenopathy in the cervical, axillary or inguinal LUNGS: clear to auscultation and percussion with normal breathing effort HEART: regular rate & rhythm and no murmurs and no lower extremity edema ABDOMEN:abdomen soft, non-tender and normal bowel sounds Musculoskeletal:no cyanosis of digits and no clubbing  NEURO: alert & oriented x 3 with fluent speech, no  focal motor/sensory deficits  LABORATORY DATA:  I have reviewed the data as listed CBC Latest Ref Rng & Units 10/12/2018 10/05/2018 09/28/2018  WBC 4.0 - 10.5 K/uL 8.0 13.1(H) 19.2(H)  Hemoglobin 12.0 - 15.0 g/dL 12.6 13.4 13.6  Hematocrit 36.0 - 46.0 % 38.0 40.1 41.2  Platelets 150 - 400 K/uL 125(L) 148(L) 175     CMP Latest Ref Rng & Units 10/12/2018 10/05/2018 09/28/2018  Glucose 70 - 99 mg/dL 77 86 147(H)  BUN 8 - 23 mg/dL 12 12 17   Creatinine 0.44 - 1.00 mg/dL 0.87 0.83 0.97  Sodium 135 - 145 mmol/L 142 141 140  Potassium 3.5 - 5.1 mmol/L 3.5 3.8 3.7  Chloride 98 - 111 mmol/L 111 111 112(H)  CO2 22 - 32 mmol/L 24 23 21(L)  Calcium 8.9 - 10.3 mg/dL 9.0 9.2 9.0  Total Protein 6.5 - 8.1 g/dL 6.1(L) 6.2(L) 6.3(L)  Total Bilirubin 0.3 - 1.2 mg/dL 0.3 0.5 0.5  Alkaline Phos 38 - 126 U/L 44 49 46  AST 15 - 41 U/L 16 14(L) 10(L)  ALT 0 - 44 U/L 16 12 10       RADIOGRAPHIC STUDIES: I have personally reviewed the radiological images as listed and agreed with the findings in the report. No results found.   ASSESSMENT & PLAN:  Bethany Oconnor is a 78 y.o. female with   1.Anal Squamous Cell Carcinoma, cT1N1aM0 stage IIIA -Diagnosed in 07/2018. S/p surgical resection with close margins. -Her staging PET scan was negative for distant metastasis, but showed hypermetabolic right  inguinal lymph node, and a biopsy confirmed squamous cell carcinoma. -She was not a good candidate for 5-fu/mitomycin chemo with radiation.She started concurrent chemoRT with Xeloda 1500mg  BID M-F on 09/21/18. She tolerated moderately well withintermittentdiarrhea, nausea and rectal pain. -Patient experienced severe diarrhea from Xeloda on second week treatment.Given poor tolerance we stopped Xeloda and continued with Radiation alone. Her diarrhea is now controlled.  -She continued to have significant rectal pain from radiation she stopped radiation early last week by rad/onc.   -Given she had to discontinued treatment early, I do not anticipate her cancer will be cured. I discussed her possible prognosis with patient. Will proceed with palliative care to treat her cancer symptoms. I encouraged her care givers to contact me with any concerns. She will see palliative care NP Stanton Kidney today to discuss goal of care and symptoms managment.  -I do not plant to rescan her. If new significant pain presents we can consider palliative RT to improve pain. At that time a scan may be necessary.  -I encouraged her to work on walking more and with her walker.  I encouraged her to eat well  -F/u in 1 month   2. Chroniclymphocyticleukemia (CLL), stage 0 -Diagnosed in 2015. Currently under observationand has not required any treatment -Her ALC has been trending up, but also fluctuates, not sure if it's related to her recent anal cancer and surgery  -she has no B symptoms, noanemia or thrombocytopenia,will continuemonitoring.   3. HTN -f/u with PCP -controlled   4. Atrial Fibrillation -f/u with cardiology, stable.   5. Immunizations -Continue to stay up to date with immunizations.  6. Dementia -Currently on Donepezil. Will continue. -She has a caregiver andtwoPOAs.  7. Nausea withlow appetite, weight loss, diarrhea -Likely related to anal cancer andtreatment. -Continuetof/u with  dietician   -Stable mild nausea. Diarrhea is controlled with Imodium after stopping Xeloda.   8. Depression, Anxiety,irritability -She remains angry and emotional about her condition,  diagnosis and treatment -She repeatedly expresses her wishes to die and join her husband in heaven. -Continue Lorazepam as needed -Continue to follow up with Psychiatrist.   9. Rectal pain  -secondary to radiation and chemo -I previously recommended her to take Dilaudid 2 mg at 8am and 8pm, then use 2mg  every 6 hours as needed in between, I will fax the order to her nursing home  -Due to pain she was not able to complete radiation and stopped radiation on 10/13/18.  -I discussed now that she is no longer under radiation we will wean her off Dilaudid and continue Tylenol for mild pain and I will prescribed her Norco 1/2-1 tablet q6hours to take only for severe pain as needed (10/19/18).  Her caregivers understand and agree.   Plan -I prescribed her #15 Norco today for severe pain, and wean off dilaudid  -Will see palliative NP Stanton Kidney today, plan to continue holding RT  -Lab and f/u in one month   No problem-specific Assessment & Plan notes found for this encounter.   No orders of the defined types were placed in this encounter.  All questions were answered. The patient knows to call the clinic with any problems, questions or concerns. No barriers to learning was detected. I spent 20 minutes counseling the patient face to face. The total time spent in the appointment was 25 minutes and more than 50% was on counseling and review of test results     Truitt Merle, MD 10/19/2018   I, Joslyn Devon, am acting as scribe for Truitt Merle, MD.   I have reviewed the above documentation for accuracy and completeness, and I agree with the above.

## 2018-10-19 ENCOUNTER — Telehealth: Payer: Self-pay | Admitting: Hematology

## 2018-10-19 ENCOUNTER — Ambulatory Visit
Admission: RE | Admit: 2018-10-19 | Discharge: 2018-10-19 | Disposition: A | Discharge status: Home / Self Care | Payer: Medicare Other | Source: Ambulatory Visit | Attending: Radiation Oncology | Admitting: Radiation Oncology

## 2018-10-19 ENCOUNTER — Other Ambulatory Visit: Payer: Self-pay

## 2018-10-19 ENCOUNTER — Inpatient Hospital Stay (HOSPITAL_BASED_OUTPATIENT_CLINIC_OR_DEPARTMENT_OTHER): Payer: Medicare Other | Admitting: Hematology

## 2018-10-19 ENCOUNTER — Ambulatory Visit: Payer: Medicare Other

## 2018-10-19 ENCOUNTER — Encounter: Payer: Self-pay | Admitting: Hematology

## 2018-10-19 VITALS — BP 132/73 | HR 75 | Temp 98.6°F | Resp 17 | Ht 65.0 in | Wt 158.8 lb

## 2018-10-19 DIAGNOSIS — F4381 Prolonged grief disorder: Secondary | ICD-10-CM | POA: Insufficient documentation

## 2018-10-19 DIAGNOSIS — Z515 Encounter for palliative care: Secondary | ICD-10-CM | POA: Diagnosis not present

## 2018-10-19 DIAGNOSIS — G3183 Dementia with Lewy bodies: Secondary | ICD-10-CM

## 2018-10-19 DIAGNOSIS — Z66 Do not resuscitate: Secondary | ICD-10-CM

## 2018-10-19 DIAGNOSIS — F329 Major depressive disorder, single episode, unspecified: Secondary | ICD-10-CM

## 2018-10-19 DIAGNOSIS — F4329 Adjustment disorder with other symptoms: Secondary | ICD-10-CM | POA: Insufficient documentation

## 2018-10-19 DIAGNOSIS — F419 Anxiety disorder, unspecified: Secondary | ICD-10-CM

## 2018-10-19 DIAGNOSIS — I10 Essential (primary) hypertension: Secondary | ICD-10-CM

## 2018-10-19 DIAGNOSIS — G893 Neoplasm related pain (acute) (chronic): Secondary | ICD-10-CM

## 2018-10-19 DIAGNOSIS — C210 Malignant neoplasm of anus, unspecified: Secondary | ICD-10-CM

## 2018-10-19 DIAGNOSIS — Z923 Personal history of irradiation: Secondary | ICD-10-CM

## 2018-10-19 DIAGNOSIS — F4321 Adjustment disorder with depressed mood: Secondary | ICD-10-CM | POA: Insufficient documentation

## 2018-10-19 DIAGNOSIS — R110 Nausea: Secondary | ICD-10-CM

## 2018-10-19 DIAGNOSIS — F339 Major depressive disorder, recurrent, unspecified: Secondary | ICD-10-CM | POA: Insufficient documentation

## 2018-10-19 DIAGNOSIS — C9110 Chronic lymphocytic leukemia of B-cell type not having achieved remission: Secondary | ICD-10-CM

## 2018-10-19 DIAGNOSIS — F0280 Dementia in other diseases classified elsewhere without behavioral disturbance: Secondary | ICD-10-CM

## 2018-10-19 DIAGNOSIS — I4891 Unspecified atrial fibrillation: Secondary | ICD-10-CM | POA: Diagnosis not present

## 2018-10-19 MED ORDER — HYDROCODONE-ACETAMINOPHEN 5-325 MG PO TABS: 1.0000 | ORAL_TABLET | Freq: Four times a day (QID) | ORAL | 0 refills | Status: DC | PRN

## 2018-10-19 NOTE — Telephone Encounter (Signed)
Scheduled appt per 02/17 los.  Printed and mailed calendar.

## 2018-10-19 NOTE — Consult Note (Signed)
Consultation Note Date: 10/19/2018   Patient Name: Bethany Oconnor  DOB: Jul 20, 1941  MRN: [id number]  Age / Sex: 78 y.o., female  PCP: Crist Infante, MD Referring Physician: Kyung Rudd, MD  Reason for Consultation: Establishing goals of care and Psychosocial/spiritual support  HPI/Patient Profile: 78 y.o. female  with past medical history of:   Anal Squamous Cell Carcinoma, cT1N1aM0 stage IIIA -Diagnosed in 07/2018. S/p surgical resection -Her staging PET scan was negative for distant metastasis, but showed hypermetabolic right inguinal lymph node, and a biopsy confirmed squamous cell carcinoma. -She was not a good candidate for 5-fu/mitomycin chemo with radiation.She started concurrent chemoRT with Xeloda 1500mg  BID M-F on 09/21/18. She tolerated moderately well withintermittentdiarrhea, nausea and rectal pain.  2. Chroniclymphocyticleukemia (CLL), stage 0 -Diagnosed in 2015. Currently under observationand has not required any treatment -Her ALC has been trending up, but also fluctuates, not sure if it's related to her recent anal cancer and surgery  -she has no B symptoms, noanemia or thrombocytopenia,will continuemonitoring.  Patient and family face treatment option decisions, advanced directive decisions and anticipatory care needs. Decision makers have spoke with Dr. Burr Medico today and to not wish to pursue any further chemotherapy or radiation for cancer diagnosis and wished to shift to a full comfort path.   Clinical Assessment and Goals of Care:  This NP Wadie Lessen reviewed medical records, received report from team, assessed the patient and then meet at the patient's bedside along with her sister-in- law Rhoderick Moody and caregiver [name]  to discuss diagnosis, prognosis, GOC, EOL wishes disposition and options.  Patient is pleasantly confused with poor insight into her current  medical situation  Concept of Hospice and Palliative Care were discussed  A detailed discussion was had today regarding advanced directives.  Concepts specific to code status, artifical feeding and hydration, continued IV antibiotics and rehospitalization was had.  The difference between a aggressive medical intervention path  and a palliative comfort care path for this patient at this time was had.  Values and goals of care important to patient and family were attempted to be elicited.  MOST form completed  Natural trajectory and expectations at EOL were discussed.  Questions and concerns addressed.   Family encouraged to call with questions or concerns.    PMT will continue to support holistically.   HCPOA/ Micky Carter     SUMMARY OF RECOMMENDATIONS    Code Status/Advance Care Planning:  DNR   Palliative Prophylaxis:   Bowel Regimen, Frequent Pain Assessment and Oral Care  Additional Recommendations (Limitations, Scope, Preferences):  Full Comfort Care  Psycho-social/Spiritual:   Additional Recommendations: Education on Hospice  Prognosis:   < 6 months  Discharge Planning: Placed call to Polly Cobia  # [phone] at [facility] to discuss hospice referral   Home with Hospice     Primary Diagnoses: Present on Admission: **None**   I have reviewed the medical record, interviewed the patient and family, and examined the patient. The following aspects are pertinent.  Past Medical History:  Diagnosis Date  . Anxiety   . Arthritis   . Cerebral aneurysm    s/p endovascular colling 2008  . CLL (chronic lymphocytic leukemia) (HCC)    CLL  . Depression   . Hearing loss   . Macular degeneration   . Neuropathy   . Paroxysmal atrial fibrillation (HCC)   . Pneumonia    08-04-17  . PONV (postoperative nausea and vomiting)   . Thyroid disease    Social History   Socioeconomic History  . Marital status: Widowed    Spouse name: Not on file  . Number of  children: 2  . Years of education: 16  . Highest education level: Bachelor's degree (e.g., BA, AB, BS)  Occupational History    Employer: RETIRED  Social Needs  . Financial resource strain: Not on file  . Food insecurity:    Worry: Not on file    Inability: Not on file  . Transportation needs:    Medical: No    Non-medical: No  Tobacco Use  . Smoking status: Former Smoker    Last attempt to quit: 09/02/1988    Years since quitting: 30.1  . Smokeless tobacco: Never Used  Substance and Sexual Activity  . Alcohol use: No    Alcohol/week: 1.0 - 2.0 standard drinks    Types: 1 - 2 Glasses of wine per week    Comment: daily for 50 years  No longer drinks 09-01-17  . Drug use: No  . Sexual activity: Not Currently  Lifestyle  . Physical activity:    Days per week: Not on file    Minutes per session: Not on file  . Stress: Not on file  Relationships  . Social connections:    Talks on phone: Not on file    Gets together: Not on file    Attends religious service: Not on file    Active member of club or organization: Not on file    Attends meetings of clubs or organizations: Not on file    Relationship status: Not on file  Other Topics Concern  . Not on file  Social History Narrative   Lives at [employer].  Retired Agricultural engineer.  Education: college.  2 children.   Family History  Problem Relation Age of Onset  . Cancer Father 42       died  . Cerebral aneurysm Mother 60       died  . Heart attack Brother    Scheduled Meds: Continuous Infusions: PRN Meds:. Medications Prior to Admission:  Prior to Admission medications   Medication Sig Start Date End Date Taking? Authorizing Provider  acetaminophen (TYLENOL) 500 MG tablet Take 500 mg by mouth every 8 (eight) hours.     [provider]  buPROPion (WELLBUTRIN XL) 150 MG 24 hr tablet Take 150 mg by mouth daily. Take with the 300mg  tablet 10/01/18   [provider]  buPROPion (WELLBUTRIN XL) 300 MG 24 hr tablet  Take 450 mg by mouth daily. Take with the 150mg  tablet 02/11/18   [provider]  Calcium Carbonate-Vitamin D 600-400 MG-UNIT per tablet Take 1 tablet by mouth daily. 08/19/13   Sherren Mocha, MD  COMBIGAN 0.2-0.5 % ophthalmic solution Place 1 drop into the left eye 2 (two) times daily.  09/12/17   [provider]  diltiazem (CARDIZEM CD) 180 MG 24 hr capsule Take 180 mg by mouth daily.    [provider]  donepezil (ARICEPT) 5 MG tablet Take 5 mg by mouth at  bedtime.    [provider]  dorzolamide (TRUSOPT) 2 % ophthalmic solution  09/24/18   [provider]  gabapentin (NEURONTIN) 100 MG capsule Take 100 mg by mouth 2 (two) times daily.    [provider]  HYDROcodone-acetaminophen (NORCO) 5-325 MG tablet Take 1 tablet by mouth every 6 (six) hours as needed for moderate pain. 10/19/18   Truitt Merle, MD  hydrocortisone (ANUSOL-HC) 25 MG suppository Place 25 mg rectally 2 (two) times daily as needed for hemorrhoids or anal itching.    [provider]  HYDROmorphone (DILAUDID) 2 MG tablet Take 1 tablet (2 mg total) by mouth every 6 (six) hours as needed for severe pain. 10/08/18   Tyler Pita, MD  HYDROmorphone (DILAUDID) 2 MG tablet Take 2 mg by mouth 2 (two) times daily. 8am & 8pm    [provider]  ibuprofen (ADVIL,MOTRIN) 400 MG tablet Take 1 tablet (400 mg total) by mouth every 8 (eight) hours as needed for moderate pain. 03/16/18   Jessy Oto, MD  lamoTRIgine (LAMICTAL) 100 MG tablet Take 100 mg by mouth at bedtime.    [provider]  latanoprost (XALATAN) 0.005 % ophthalmic solution Place 1 drop into the left eye at bedtime.     [provider]  Lidocaine (ASPERCREME LIDOCAINE) 4 % PTCH Apply 1 patch topically daily as needed (for lower back / right hip pain).    [provider]  LORazepam (ATIVAN) 1 MG tablet Take 1 mg by mouth 2 (two) times daily.  09/16/18   [provider]    meclizine (ANTIVERT) 25 MG tablet Take 25 mg by mouth 2 (two) times daily as needed for dizziness.    [provider]  Melatonin 5 MG TABS Take 5 mg by mouth at bedtime.    [provider]  memantine (NAMENDA) 5 MG tablet Take 5 mg by mouth daily.    [provider]  mirabegron ER (MYRBETRIQ) 50 MG TB24 tablet Take 50 mg by mouth daily.    [provider]  Multiple Vitamins-Minerals (PRESERVISION AREDS) CAPS Take 1 capsule by mouth daily.    [provider]  ondansetron (ZOFRAN) 8 MG tablet Take 1 tablet (8 mg total) by mouth 2 (two) times daily as needed (Nausea or vomiting). 09/09/18   Truitt Merle, MD  polyethylene glycol [facility] / Floria Raveling) packet Take 17 g by mouth daily as needed for moderate constipation.    [provider]  pravastatin (PRAVACHOL) 40 MG tablet Take 40 mg by mouth daily.  02/10/13   Sherren Mocha, MD  Probiotic Product (ALIGN) 4 MG CAPS Take 4 mg by mouth daily.     [provider]  prochlorperazine (COMPAZINE) 10 MG tablet Take 1 tablet (10 mg total) by mouth every 6 (six) hours as needed (Nausea or vomiting). 09/09/18   Truitt Merle, MD  trimethoprim (TRIMPEX) 100 MG tablet Take 100 mg by mouth daily.    [provider]  Wheat Dextrin (BENEFIBER PO) Take 1 each by mouth See admin instructions. Mix 1 tablespoon twice daily.    [provider]   Allergies  Allergen Reactions  . Ambien [Zolpidem Tartrate] Other (See Comments)    UNSPECIFIED REACTION   . Fioricet-Codeine [Butalbital-Apap-Caff-Cod] Other (See Comments)    UNSPECIFIED REACTION   . Macrobid [Nitrofurantoin] Other (See Comments)    UNSPECIFIED REACTION   . Codeine Other (See Comments)    About 50 years ago, took Codeine for scratched cornea while pregnant, and  it made her extremely sedated.  . Hydrocodone-Acetaminophen Nausea And Vomiting  . Keflex [Cephalexin] Rash  . Percocet [Oxycodone-Acetaminophen] Nausea And Vomiting   Review  of Systems  Neurological: Positive for weakness.    Physical Exam Constitutional:      Appearance: She is underweight. She is ill-appearing.  Musculoskeletal:     Comments: Generalized weakness, sitting in WC  Skin:    General: Skin is warm and dry.  Neurological:     Mental Status: She is alert. She is confused.     Vital Signs: There were no vitals taken for this visit.         SpO2:   O2 Device:  O2 Flow Rate: .   IO: Intake/output summary: No intake or output data in the 24 hours ending 10/19/18 1547  LBM:   Baseline Weight:   Most recent weight:       Palliative Assessment/Data: 30%   Discussed with Allsion Perkins PA-C  Time In: 1415 Time Out: 1530 Time Total: 75 minutes Greater than 50%  of this time was spent counseling and coordinating care related to the above assessment and plan.  Signed by: Wadie Lessen, NP   Please contact Palliative Medicine Team phone at [phone] for questions and concerns.  For individual provider: See Shea Evans

## 2018-10-20 ENCOUNTER — Ambulatory Visit: Payer: Medicare Other

## 2018-10-20 ENCOUNTER — Encounter: Payer: Self-pay | Admitting: Radiation Oncology

## 2018-10-20 DIAGNOSIS — Z515 Encounter for palliative care: Secondary | ICD-10-CM | POA: Insufficient documentation

## 2018-10-20 DIAGNOSIS — F0280 Dementia in other diseases classified elsewhere without behavioral disturbance: Secondary | ICD-10-CM | POA: Insufficient documentation

## 2018-10-20 DIAGNOSIS — Z66 Do not resuscitate: Secondary | ICD-10-CM | POA: Insufficient documentation

## 2018-10-20 DIAGNOSIS — G3183 Dementia with Lewy bodies: Secondary | ICD-10-CM

## 2018-10-20 NOTE — Progress Notes (Signed)
Yesterday the patient met with palliative care in our outpatient clinic for a goals of care discussion. I was a part of the discussion and at the conclusion of the visit, the patient and her HCPOA have decided to elect for hospice benefits and discontinue radiotherapy. We reviewed that we could always re-evaluate her situation if she became symptomatic from her cancer, but will discontinue her treatment as requested. The treatment staff were notified to drop EOT documenation.      C. , PAC  

## 2018-10-21 ENCOUNTER — Ambulatory Visit: Payer: Medicare Other

## 2018-10-22 ENCOUNTER — Other Ambulatory Visit: Payer: Self-pay | Admitting: Adult Health

## 2018-10-22 ENCOUNTER — Ambulatory Visit: Payer: Medicare Other

## 2018-10-23 ENCOUNTER — Ambulatory Visit: Payer: Medicare Other

## 2018-10-23 ENCOUNTER — Telehealth: Payer: Self-pay

## 2018-10-23 NOTE — Telephone Encounter (Signed)
Spoke with [name] (patient's care giver) regarding upcoming appointments.  Patient now on [place], will cancel these appointments, informed her to call at any time if patient desires to see Dr. Burr Medico.

## 2018-10-26 ENCOUNTER — Ambulatory Visit: Payer: Medicare Other

## 2018-10-26 ENCOUNTER — Other Ambulatory Visit: Payer: Self-pay

## 2018-10-26 ENCOUNTER — Ambulatory Visit: Payer: Self-pay | Admitting: Hematology

## 2018-10-27 ENCOUNTER — Ambulatory Visit: Payer: Medicare Other

## 2018-10-28 ENCOUNTER — Ambulatory Visit: Payer: Medicare Other

## 2018-10-29 ENCOUNTER — Encounter: Payer: Self-pay | Admitting: Adult Health

## 2018-10-29 ENCOUNTER — Ambulatory Visit: Payer: Medicare Other

## 2018-10-29 ENCOUNTER — Non-Acute Institutional Stay (SKILLED_NURSING_FACILITY): Payer: Medicare Other | Admitting: Adult Health

## 2018-10-29 DIAGNOSIS — B3731 Acute candidiasis of vulva and vagina: Secondary | ICD-10-CM

## 2018-10-29 DIAGNOSIS — R339 Retention of urine, unspecified: Secondary | ICD-10-CM

## 2018-10-29 DIAGNOSIS — C210 Malignant neoplasm of anus, unspecified: Secondary | ICD-10-CM

## 2018-10-29 DIAGNOSIS — R5383 Other fatigue: Secondary | ICD-10-CM

## 2018-10-29 DIAGNOSIS — N3001 Acute cystitis with hematuria: Secondary | ICD-10-CM

## 2018-10-29 DIAGNOSIS — R630 Anorexia: Secondary | ICD-10-CM

## 2018-10-29 DIAGNOSIS — B373 Candidiasis of vulva and vagina: Secondary | ICD-10-CM

## 2018-10-29 NOTE — Progress Notes (Signed)
Location:  [facility] psychologist of Service:  SNF (31) Provider:   Cindi Carbon, [place] [phone]   Crist Infante, MD  Patient Care Team: Crist Infante, MD as PCP - General (Internal Medicine) Sherren Mocha, MD as PCP - Cardiology (Cardiology) Crist Infante, MD (Internal Medicine) Newton Pigg, MD as Consulting Physician (Obstetrics and Gynecology) Truitt Merle, MD as Consulting Physician (Hematology) Leta Baptist, MD as Consulting Physician (Otolaryngology) Sherlynn Stalls, MD as Consulting Physician (Ophthalmology) Paulla Dolly Tamala Fothergill, DPM as Consulting Physician (Podiatry) Harriett Sine, MD as Consulting Physician (Dermatology) Erroll Luna, MD as Consulting Physician (General Surgery)  Extended Emergency Contact Information Primary Emergency Contact: [place], [place] Phone: [phone] Work Phone: [phone] Relation: None Secondary Emergency Contact: Dorris Singh Mobile Phone: [phone] Relation: Other  Code Status:  DNR Goals of care: Advanced Directive information Advanced Directives 10/13/2018  Does Patient Have a Medical Advance Directive? Yes  Type of Advance Directive Out of facility DNR (pink MOST or yellow form);[place];Living will  Does patient want to make changes to medical advance directive? No - Patient declined  Copy of [place] in Chart? Yes - validated most recent copy scanned in chart (See row information)  Would patient like information on creating a medical advance directive? -  Pre-existing out of facility DNR order (yellow form or pink MOST form) Yellow form placed in chart (order not valid for inpatient use)     Chief Complaint  Patient presents with  . Acute Visit    urinary retention, lethargy, weakness    HPI:  Pt is a 78 y.o. female seen today for an acute visit for urinary retention, lethargy, and weakness. She is currently in rehab due to  weakness associated  With anal cancer. 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  On 12/11 a pet scan was obtained which showed a hypermetabolic inguinal lymph node which returned positive by bx.  She was treated with chemo and radiation in January. She decided to stop all treatment in Feb due to intolerance to treatment. She has not had any rectal pain but is on scheduled Dilaudid 0.5 mg bid. The nurse reports that she was walking and doing well this morning but received her scheduled dose and the felt lethargic and weak. Her caregiver reports that she continues with a decreased appetite and will only eat certain foods. She has redness to the vaginal and rectal area and is using mycolog cream.  Staff report frequency x 2-3 days with urine volume small 20cc. PVR 200-300 with bladder scanner. Feels the need to go but can't empty the bladder. UA C and S done on 2/25 with WBC 50-100, 3+ blood, 2+ bacteria, and pos leuk esterase. No initial growth but unfortunately she was on trimethoprim when the specimen was obtained. The resident report burning with urination as well. She requires 2 person assistance and a gait belt   Past Medical History:  Diagnosis Date  . Anxiety   . Arthritis   . Cerebral aneurysm    s/p endovascular colling 2008  . CLL (chronic lymphocytic leukemia) (HCC)    CLL  . Depression   . Hearing loss   . Macular degeneration   . Neuropathy   . Paroxysmal atrial fibrillation (HCC)   . Pneumonia    08-04-17  . PONV (postoperative nausea and vomiting)   . Thyroid disease    Past Surgical History:  Procedure Laterality Date  . ANEURYSM COILING    . CATARACT EXTRACTION     bilateral  . CERVICAL CONE BIOPSY    . COLONOSCOPY    . EVALUATION UNDER ANESTHESIA WITH HEMORRHOIDECTOMY N/A 09/03/2017   Procedure: EXAM UNDER ANESTHESIA, EXCISION OF ANAL LESION, POSSIBLE HEMORRHOIDECTOMY;  Surgeon: Ileana Roup, MD;   Location: [facility];  Service: General;  Laterality: N/A;  . LESION REMOVAL N/A 07/22/2018   Procedure: EXCISION OF PERIANAL LESION;  Surgeon: Ileana Roup, MD;  Location: [facility];  Service: General;  Laterality: N/A;  . LUMBAR LAMINECTOMY/DECOMPRESSION MICRODISCECTOMY Right 04/07/2018   Procedure: Right Lumbar Four-Five Lumbar Five-Sacral One Laminectomy/Microdiscectomy;  Surgeon: Kristeen Miss, MD;  Location: [facility];  Service: Neurosurgery;  Laterality: Right;  Right Lumbar Four-Five Lumbar Five-Sacral One Laminectomy/Microdiscectomy  . RECTAL EXAM UNDER ANESTHESIA N/A 07/22/2018   Procedure: ANORECTAL EXAM UNDER ANESTHESIA ERAS PATHWAY;  Surgeon: Ileana Roup, MD;  Location: [facility];  Service: General;  Laterality: N/A;  . THYROIDECTOMY    . TONSILLECTOMY      Allergies  Allergen Reactions  . Ambien [Zolpidem Tartrate] Other (See Comments)    UNSPECIFIED REACTION   . Fioricet-Codeine [Butalbital-Apap-Caff-Cod] Other (See Comments)    UNSPECIFIED REACTION   . Macrobid [Nitrofurantoin] Other (See Comments)    UNSPECIFIED REACTION   . Codeine Other (See Comments)    About 50 years ago, took Codeine for scratched cornea while pregnant, and it made her extremely sedated.  . Hydrocodone-Acetaminophen Nausea And Vomiting  . Keflex [Cephalexin] Rash  . Percocet [Oxycodone-Acetaminophen] Nausea And Vomiting    Outpatient Encounter Medications as of 10/29/2018  Medication Sig  . acetaminophen (TYLENOL) 500 MG tablet Take 500 mg by mouth every 8 (eight) hours.   Marland Kitchen donepezil (ARICEPT) 5 MG tablet Take 5 mg by mouth at bedtime.  Marland Kitchen estrogen, conjugated,-medroxyprogesterone (PREMPRO) 0.625-2.5 MG tablet Take 1 tablet by mouth daily.  Marland Kitchen gabapentin (NEURONTIN) 100 MG capsule Take 100 mg by mouth 2 (two) times daily.  Marland Kitchen lamoTRIgine (LAMICTAL) 25 MG tablet Take 25 mg by mouth 2 (two) times daily. 25 mg in the am and 50 mg in the evening  . latanoprost (XALATAN) 0.005 % ophthalmic solution  Place 1 drop into the left eye at bedtime.   . Lidocaine (ASPERCREME LIDOCAINE) 4 % PTCH Apply 1 patch topically daily as needed (for lower back / right hip pain).  . LORazepam (ATIVAN) 0.5 MG tablet Take 0.5 mg by mouth daily as needed for anxiety.  Marland Kitchen LORazepam (ATIVAN) 1 MG tablet Take 1 mg by mouth 2 (two) times daily.   . meclizine (ANTIVERT) 25 MG tablet Take 25 mg by mouth 2 (two) times daily as needed for dizziness.  . Melatonin 5 MG TABS Take 5 mg by mouth at bedtime.  . memantine (NAMENDA) 5 MG tablet Take 5 mg by mouth daily.  . mirabegron ER (MYRBETRIQ) 50 MG TB24 tablet Take 50 mg by mouth daily.  . Multiple Vitamins-Minerals (PRESERVISION AREDS) CAPS Take 1 capsule by mouth daily.  Marland Kitchen nystatin-triamcinolone (MYCOLOG II) cream Apply 1 application topically 2 (two) times daily.  . ondansetron (ZOFRAN) 8 MG tablet Take 1 tablet (8 mg total) by mouth 2 (two) times daily as needed (Nausea or vomiting).  . polyethylene glycol (MIRALAX / GLYCOLAX) packet Take 17 g by mouth daily as needed for moderate constipation.  . pravastatin (PRAVACHOL) 40 MG tablet Take 40 mg by mouth daily.   . Probiotic Product (ALIGN) 4 MG CAPS Take  4 mg by mouth daily.   . prochlorperazine (COMPAZINE) 10 MG tablet Take 1 tablet (10 mg total) by mouth every 6 (six) hours as needed (Nausea or vomiting).  Marland Kitchen senna-docusate (SENOKOT-S) 8.6-50 MG tablet Take 2 tablets by mouth at bedtime.  Marland Kitchen trimethoprim (TRIMPEX) 100 MG tablet Take 100 mg by mouth daily.  . Wheat Dextrin (BENEFIBER PO) Take 1 each by mouth See admin instructions. Mix 1 tablespoon twice daily.  . [DISCONTINUED] lamoTRIgine (LAMICTAL) 100 MG tablet Take 100 mg by mouth at bedtime.  Marland Kitchen buPROPion (WELLBUTRIN XL) 150 MG 24 hr tablet Take 150 mg by mouth daily. Take with the 300mg  tablet  . buPROPion (WELLBUTRIN XL) 300 MG 24 hr tablet Take 450 mg by mouth daily. Take with the 150mg  tablet  . Calcium Carbonate-Vitamin D 600-400 MG-UNIT per tablet Take 1  tablet by mouth daily.  . COMBIGAN 0.2-0.5 % ophthalmic solution Place 1 drop into the left eye 2 (two) times daily.   Marland Kitchen diltiazem (CARDIZEM CD) 180 MG 24 hr capsule Take 180 mg by mouth daily.  . dorzolamide (TRUSOPT) 2 % ophthalmic solution   . hydrocortisone (ANUSOL-HC) 25 MG suppository Place 25 mg rectally 2 (two) times daily as needed for hemorrhoids or anal itching.  Marland Kitchen ibuprofen (ADVIL,MOTRIN) 400 MG tablet Take 1 tablet (400 mg total) by mouth every 8 (eight) hours as needed for moderate pain.  . [DISCONTINUED] HYDROcodone-acetaminophen (NORCO) 5-325 MG tablet Take 1 tablet by mouth every 6 (six) hours as needed for moderate pain.  . [DISCONTINUED] HYDROmorphone (DILAUDID) 2 MG tablet Take 1 tablet (2 mg total) by mouth every 6 (six) hours as needed for severe pain.   No facility-administered encounter medications on file as of 10/29/2018.     Review of Systems  Constitutional: Positive for activity change, appetite change and fatigue. Negative for chills, diaphoresis, fever and unexpected weight change.  HENT: Negative for congestion.   Respiratory: Negative for cough and shortness of breath.   Cardiovascular: Negative for palpitations and leg swelling.  Gastrointestinal: Negative for abdominal distention, constipation and diarrhea.  Genitourinary: Positive for decreased urine volume, difficulty urinating, dysuria, frequency, hematuria and urgency. Negative for flank pain, genital sores, menstrual problem, pelvic pain, vaginal bleeding, vaginal discharge and vaginal pain.  Musculoskeletal: Positive for arthralgias and gait problem. Negative for back pain.  Skin: Positive for rash.  Neurological: Positive for weakness. Negative for tremors, seizures, syncope and speech difficulty.  Psychiatric/Behavioral: Positive for behavioral problems and confusion.    Immunization History  Administered Date(s) Administered  . Influenza, High Dose Seasonal PF 06/19/2018  . Influenza-Unspecified  06/27/2016  . Pneumococcal Conjugate-13 07/21/2013, 04/15/2016  . Pneumococcal Polysaccharide-23 03/14/2010  . Td 03/09/2010  . Tdap 12/19/2016  . Zoster 08/02/2005, 03/14/2010  . Zoster Recombinat (Shingrix) 06/18/2017, 10/22/2017   Pertinent  Health Maintenance Due  Topic Date Due  . FOOT EXAM  02/12/1951  . OPHTHALMOLOGY EXAM  02/12/1951  . HEMOGLOBIN A1C  05/14/2018  . URINE MICROALBUMIN  11/12/2018  . INFLUENZA VACCINE  Completed  . DEXA SCAN  Completed  . PNA vac Low Risk Adult  Completed   Fall Risk  10/02/2018  Falls in the past year? 0  Number falls in past yr: 0  Injury with Fall? 0  Follow up Falls evaluation completed;Education provided;Falls prevention discussed   Functional Status Survey:    Vitals:   10/29/18 1045  BP: (!) 142/88  Pulse: 76  Resp: 16  Temp: (!) 97.5 F (36.4 C)  SpO2:  96%  Weight: 151 lb 3.2 oz (68.6 kg)   Body mass index is 25.16 kg/m. Physical Exam Constitutional:      General: She is not in acute distress.    Appearance: She is not ill-appearing.  HENT:     Head: Atraumatic.     Mouth/Throat:     Mouth: Mucous membranes are moist.     Pharynx: Oropharynx is clear. No oropharyngeal exudate.  Eyes:     Conjunctiva/sclera: Conjunctivae normal.     Pupils: Pupils are equal, round, and reactive to light.  Cardiovascular:     Rate and Rhythm: Normal rate and regular rhythm.     Heart sounds: No murmur.  Pulmonary:     Effort: Pulmonary effort is normal. No respiratory distress.     Breath sounds: Normal breath sounds. No wheezing.  Abdominal:     General: Abdomen is flat. Bowel sounds are normal. There is no distension.     Palpations: Abdomen is soft. There is no mass.     Tenderness: There is abdominal tenderness. There is no right CVA tenderness, left CVA tenderness, guarding or rebound.     Hernia: No hernia is present.  Genitourinary:    Comments: Atrophic vaginal area. Erythema to vaginal area and  rectum Musculoskeletal:     Right lower leg: No edema.     Left lower leg: No edema.  Lymphadenopathy:     Cervical: No cervical adenopathy.  Skin:    General: Skin is warm and dry.  Neurological:     General: No focal deficit present.     Comments: Sleepy, oriented to place and self but not time.  Psychiatric:     Comments: sleepy     Labs reviewed: Recent Labs    09/28/18 1423 10/05/18 1350 10/12/18 1423  NA 140 141 142  K 3.7 3.8 3.5  CL 112* 111 111  CO2 21* 23 24  GLUCOSE 147* 86 77  BUN 17 12 12   CREATININE 0.97 0.83 0.87  CALCIUM 9.0 9.2 9.0   Recent Labs    09/28/18 1423 10/05/18 1350 10/12/18 1423  AST 10* 14* 16  ALT 10 12 16   ALKPHOS 46 49 44  BILITOT 0.5 0.5 0.3  PROT 6.3* 6.2* 6.1*  ALBUMIN 3.5 3.6 3.3*   Recent Labs    09/28/18 1423 10/05/18 1350 10/12/18 1423  WBC 19.2* 13.1* 8.0  NEUTROABS 4.4 3.1 3.2  HGB 13.6 13.4 12.6  HCT 41.2 40.1 38.0  MCV 100.7* 100.5* 102.4*  PLT 175 148* 125*   Lab Results  Component Value Date   TSH 1.78 11/11/2017   Lab Results  Component Value Date   HGBA1C 5.6 11/11/2017   Lab Results  Component Value Date   CHOL 146 11/11/2017   HDL 42 11/11/2017   LDLCALC 63 11/11/2017   TRIG 207 (A) 11/11/2017    Significant Diagnostic Results in last 30 days:  No results found.  Assessment/Plan 1. Acute cystitis with hematuria UA positive with blood, wbcs, and leukocyte esterase, but no growth on culture since it was obtained while on trimethoprim Give one dose of fosfomycin 3 grams, hold trimethoprim x 1 week. May need to recollect culture if symptoms persist. May use lidocaine jelly for vagina discomfort.  2. Urinary retention Place foley 14 or 16 Fr to straight drain Possibly immobility and opiate use are too blame, along with acute infection Will treat for infection and trial off next week. Consider radiation cystitis and consult with rad/onc if needed.  3. Lethargy Due to dilaudid and  infection,will discontinue dilaudid and use ibuprofen or tylenol for pain. She denies any pain at this time.   4. Decreased appetite Continue to offer snacks, meals that she likes, and/or supplements Her caregiver asked about restarting the megace but at this point in her care it is probably not needed.   5. Anal squamous cell carcinoma ([place]) Off treatment with mets to lymph node. Followed by hospice now with comfort care goals.   6. Vaginal candidiasis Diflucan 150 mg x 1 dose  Family/ staff Communication: discussed with resident  Labs/tests ordered:  NA

## 2018-10-30 ENCOUNTER — Ambulatory Visit: Payer: Medicare Other

## 2018-11-02 ENCOUNTER — Ambulatory Visit: Payer: Medicare Other

## 2018-11-12 ENCOUNTER — Other Ambulatory Visit: Payer: Self-pay | Admitting: Adult Health

## 2018-11-12 MED ORDER — HYDROMORPHONE HCL 2 MG PO TABS: 1.0000 mg | ORAL_TABLET | Freq: Two times a day (BID) | ORAL | 0 refills | Status: DC | PRN

## 2018-11-12 NOTE — Progress Notes (Signed)
Resident is reporting pelvic discomfort and generalized discomfort. She is on hospice care and her goals of care are comfort based. The dilaudid has been used in the past without allergic reaction. She does have lethargy with this medication but given her debility, dementia, and current state of health, comfort as the goal seems appropriate.

## 2018-11-17 ENCOUNTER — Ambulatory Visit: Payer: Self-pay | Admitting: Hematology

## 2018-11-17 ENCOUNTER — Other Ambulatory Visit: Payer: Self-pay

## 2018-11-19 ENCOUNTER — Other Ambulatory Visit: Payer: Self-pay | Admitting: Internal Medicine

## 2018-11-19 DIAGNOSIS — F418 Other specified anxiety disorders: Secondary | ICD-10-CM

## 2018-11-19 MED ORDER — LORAZEPAM 1 MG PO TABS: 1.0000 mg | ORAL_TABLET | Freq: Two times a day (BID) | ORAL | 0 refills | Status: DC

## 2018-11-19 NOTE — Progress Notes (Signed)
  Radiation Oncology         [phone] ________________________________  Name: Bethany Oconnor MRN: [id number]  Date: 10/13/2018  DOB: 1940/10/23  End of Treatment Note  Diagnosis: 78 y.o. 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 Daily image guidance was using during the treatment. The high dose region received a total of 30.36 Gy in 17 fractions.  Narrative: The patient tolerated radiation treatment relatively well.   The patient experienced skin irritation as expected by the end of treatment. She had some rectal bleeding related to constipation, radiation, and hemorrhoids. She also noted dysuria.   The patient was planned to receive 54 Gy in 30 fractions, but due to the patient's worsening dementia and depression, the patient and her HCPOA have decided to elect for hospice benefits and discontinue radiotherapy. We reviewed that we could always re-evaluate her situation if she became symptomatic from her cancer but will discontinue her treatment at this time, as requested.  Plan: The patient has completed radiation treatment. The patient will return to radiation oncology clinic as needed. I advised the patient to call or return if they have any questions or concerns related to their recovery or treatment. ________________________________  Jodelle Gross, MD, PhD  This document serves as a record of services personally performed by Kyung Rudd, MD. It was created on his behalf by Rae Lips, a trained medical scribe. The creation of this record is based on the scribe's personal observations and the provider's statements to them. This document has been checked and approved by the attending provider.

## 2018-11-26 ENCOUNTER — Non-Acute Institutional Stay (SKILLED_NURSING_FACILITY): Payer: Medicare Other | Admitting: Adult Health

## 2018-11-26 DIAGNOSIS — R339 Retention of urine, unspecified: Secondary | ICD-10-CM

## 2018-11-26 DIAGNOSIS — C9110 Chronic lymphocytic leukemia of B-cell type not having achieved remission: Secondary | ICD-10-CM | POA: Diagnosis not present

## 2018-11-26 DIAGNOSIS — M48062 Spinal stenosis, lumbar region with neurogenic claudication: Secondary | ICD-10-CM

## 2018-11-26 DIAGNOSIS — C210 Malignant neoplasm of anus, unspecified: Secondary | ICD-10-CM | POA: Diagnosis not present

## 2018-11-26 DIAGNOSIS — F339 Major depressive disorder, recurrent, unspecified: Secondary | ICD-10-CM

## 2018-11-26 DIAGNOSIS — G301 Alzheimer's disease with late onset: Secondary | ICD-10-CM

## 2018-11-26 DIAGNOSIS — F0281 Dementia in other diseases classified elsewhere with behavioral disturbance: Secondary | ICD-10-CM

## 2018-11-26 DIAGNOSIS — B373 Candidiasis of vulva and vagina: Secondary | ICD-10-CM

## 2018-11-26 DIAGNOSIS — B3731 Acute candidiasis of vulva and vagina: Secondary | ICD-10-CM

## 2018-11-27 ENCOUNTER — Encounter: Payer: Self-pay | Admitting: Adult Health

## 2018-11-27 NOTE — Progress Notes (Signed)
Location:  [facility] of Service:  SNF (31) Provider:   Cindi Carbon, [place] [phone]  Crist Infante, MD  Patient Care Team: Crist Infante, MD as PCP - General (Internal Medicine) Sherren Mocha, MD as PCP - Cardiology (Cardiology) Crist Infante, MD (Internal Medicine) Newton Pigg, MD as Consulting Physician (Obstetrics and Gynecology) Truitt Merle, MD as Consulting Physician (Hematology) Leta Baptist, MD as Consulting Physician (Otolaryngology) Sherlynn Stalls, MD as Consulting Physician (Ophthalmology) Paulla Dolly Tamala Fothergill, DPM as Consulting Physician (Podiatry) Harriett Sine, MD as Consulting Physician (Dermatology) Erroll Luna, MD as Consulting Physician (General Surgery)  Extended Emergency Contact Information Primary Emergency Contact: [place], [place] Phone: [phone] Work Phone: [phone] Relation: None Secondary Emergency Contact: Dorris Singh Mobile Phone: [phone] Relation: Other  Code Status:  DNR Goals of care: Advanced Directive information Advanced Directives 10/13/2018  Does Patient Have a Medical Advance Directive? Yes  Type of Advance Directive Out of facility DNR (pink MOST or yellow form);[place];Living will  Does patient want to make changes to medical advance directive? No - Patient declined  Copy of [place] in Chart? Yes - validated most recent copy scanned in chart (See row information)  Would patient like information on creating a medical advance directive? -  Pre-existing out of facility DNR order (yellow form or pink MOST form) Yellow form placed in chart (order not valid for inpatient use)     Chief Complaint  Patient presents with  . Medical Management of Chronic Issues    HPI:  Pt is a 78 y.o. female seen today for medical management of chronic diseases.  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  On 12/11 a pet scan was obtained which showed a hypermetabolic inguinal lymph node which returned positive by bx.  She was treated with chemo and radiation in January. She decided to stop all treatment in Feb due to intolerance to treatment. She is now followed by hospice. She has had issues with urinary retention and frequency and has foley catheter. It is irritating her and she is requested it be removed.  Last month she experienced agitation, paranoia, and delusions. This has been an issue in the past. She was started on risperdal with improvement in symptoms noted.   She denies any pain associated with her anal or back area and has not needed prn dilaudid in several days.   Functional status: hoyer lift Past Medical History:  Diagnosis Date  . Anxiety   . Arthritis   . Cerebral aneurysm    s/p endovascular colling 2008  . CLL (chronic lymphocytic leukemia) (HCC)    CLL  . Depression   . Hearing loss   . Macular degeneration   . Neuropathy   . Paroxysmal atrial fibrillation (HCC)   . Pneumonia    08-04-17  . PONV (postoperative nausea and vomiting)   . Thyroid disease    Past Surgical History:  Procedure Laterality Date  . ANEURYSM COILING    . CATARACT EXTRACTION     bilateral  . CERVICAL CONE BIOPSY    . COLONOSCOPY    . EVALUATION UNDER ANESTHESIA WITH HEMORRHOIDECTOMY N/A 09/03/2017   Procedure: EXAM UNDER ANESTHESIA, EXCISION OF ANAL LESION, POSSIBLE HEMORRHOIDECTOMY;  Surgeon: Ileana Roup, MD;  Location: [facility];  Service: General;  Laterality: N/A;  . LESION REMOVAL N/A 07/22/2018   Procedure: EXCISION OF PERIANAL LESION;  Surgeon: Ileana Roup, MD;  Location: [facility];  Service: General;  Laterality: N/A;  . LUMBAR LAMINECTOMY/DECOMPRESSION MICRODISCECTOMY Right 04/07/2018   Procedure: Right Lumbar Four-Five Lumbar Five-Sacral One Laminectomy/Microdiscectomy;  Surgeon: Kristeen Miss, MD;  Location: [facility];  Service:  Neurosurgery;  Laterality: Right;  Right Lumbar Four-Five Lumbar Five-Sacral One Laminectomy/Microdiscectomy  . RECTAL EXAM UNDER ANESTHESIA N/A 07/22/2018   Procedure: ANORECTAL EXAM UNDER ANESTHESIA ERAS PATHWAY;  Surgeon: Ileana Roup, MD;  Location: [facility];  Service: General;  Laterality: N/A;  . THYROIDECTOMY    . TONSILLECTOMY      Allergies  Allergen Reactions  . Ambien [Zolpidem Tartrate] Other (See Comments)    UNSPECIFIED REACTION   . Fioricet-Codeine [Butalbital-Apap-Caff-Cod] Other (See Comments)    UNSPECIFIED REACTION   . Macrobid [Nitrofurantoin] Other (See Comments)    UNSPECIFIED REACTION   . Codeine Other (See Comments)    About 50 years ago, took Codeine for scratched cornea while pregnant, and it made her extremely sedated.  . Hydrocodone-Acetaminophen Nausea And Vomiting  . Keflex [Cephalexin] Rash  . Percocet [Oxycodone-Acetaminophen] Nausea And Vomiting    Outpatient Encounter Medications as of 11/26/2018  Medication Sig  . acetaminophen (TYLENOL) 500 MG tablet Take 500 mg by mouth every 8 (eight) hours.   Marland Kitchen buPROPion (WELLBUTRIN XL) 150 MG 24 hr tablet Take 150 mg by mouth daily. Take with the 300mg  tablet  . buPROPion (WELLBUTRIN XL) 300 MG 24 hr tablet Take 450 mg by mouth daily. Take with the 150mg  tablet  . Calcium Carbonate-Vitamin D 600-400 MG-UNIT per tablet Take 1 tablet by mouth daily.  . COMBIGAN 0.2-0.5 % ophthalmic solution Place 1 drop into the left eye 2 (two) times daily.   Marland Kitchen diltiazem (CARDIZEM CD) 180 MG 24 hr capsule Take 180 mg by mouth daily.  Marland Kitchen donepezil (ARICEPT) 5 MG tablet Take 5 mg by mouth at bedtime.  . dorzolamide (TRUSOPT) 2 % ophthalmic solution   . estrogen, conjugated,-medroxyprogesterone (PREMPRO) 0.625-2.5 MG tablet Take 1 tablet by mouth daily.  Marland Kitchen gabapentin (NEURONTIN) 100 MG capsule Take 100 mg by mouth 2 (two) times daily.  . hydrocortisone (ANUSOL-HC) 25 MG suppository Place 25 mg rectally 2 (two) times daily as  needed for hemorrhoids or anal itching.  Marland Kitchen HYDROmorphone (DILAUDID) 2 MG tablet Take 0.5 tablets (1 mg total) by mouth every 12 (twelve) hours as needed for severe pain.  Marland Kitchen ibuprofen (ADVIL,MOTRIN) 400 MG tablet Take 1 tablet (400 mg total) by mouth every 8 (eight) hours as needed for moderate pain.  Marland Kitchen lamoTRIgine (LAMICTAL) 25 MG tablet Take 25 mg by mouth 2 (two) times daily. 25 mg in the am and 50 mg in the evening  . latanoprost (XALATAN) 0.005 % ophthalmic solution Place 1 drop into the left eye at bedtime.   . Lidocaine (ASPERCREME LIDOCAINE) 4 % PTCH Apply 1 patch topically daily as needed (for lower back / right hip pain).  . LORazepam (ATIVAN) 0.5 MG tablet Take 0.5 mg by mouth daily as needed for anxiety.  Marland Kitchen LORazepam (ATIVAN) 1 MG tablet Take 1 tablet (1 mg total) by mouth 2 (two) times daily.  . meclizine (ANTIVERT) 25 MG tablet Take 25 mg by mouth 2 (two) times daily as needed for dizziness.  . Melatonin 5 MG TABS Take 5 mg by mouth at bedtime.  . memantine (NAMENDA) 5 MG tablet Take 5 mg by mouth daily.  . mirabegron ER (MYRBETRIQ) 50 MG TB24 tablet Take 50 mg by mouth daily.  Marland Kitchen  Multiple Vitamins-Minerals (PRESERVISION AREDS) CAPS Take 1 capsule by mouth daily.  Marland Kitchen nystatin-triamcinolone (MYCOLOG II) cream Apply 1 application topically 2 (two) times daily.  . ondansetron (ZOFRAN) 8 MG tablet Take 1 tablet (8 mg total) by mouth 2 (two) times daily as needed (Nausea or vomiting).  . polyethylene glycol (MIRALAX / GLYCOLAX) packet Take 17 g by mouth daily as needed for moderate constipation.  . pravastatin (PRAVACHOL) 40 MG tablet Take 40 mg by mouth daily.   . Probiotic Product (ALIGN) 4 MG CAPS Take 4 mg by mouth daily.   . prochlorperazine (COMPAZINE) 10 MG tablet Take 1 tablet (10 mg total) by mouth every 6 (six) hours as needed (Nausea or vomiting).  Marland Kitchen senna-docusate (SENOKOT-S) 8.6-50 MG tablet Take 2 tablets by mouth at bedtime.  Marland Kitchen trimethoprim (TRIMPEX) 100 MG tablet Take 100 mg  by mouth daily.  . Wheat Dextrin (BENEFIBER PO) Take 1 each by mouth See admin instructions. Mix 1 tablespoon twice daily.   No facility-administered encounter medications on file as of 11/26/2018.     Review of Systems  Constitutional: Negative for activity change, appetite change, chills, diaphoresis, fatigue, fever and unexpected weight change.  HENT: Negative for congestion.   Respiratory: Negative for cough, shortness of breath and wheezing.   Cardiovascular: Negative for chest pain, palpitations and leg swelling.  Gastrointestinal: Negative for abdominal distention, abdominal pain, constipation and diarrhea.  Genitourinary: Positive for frequency. Negative for difficulty urinating, dysuria, flank pain, genital sores and hematuria.       Has foley and reports irritation.   Musculoskeletal: Positive for gait problem. Negative for arthralgias, back pain, joint swelling and myalgias.  Neurological: Negative for dizziness, tremors, seizures, syncope, facial asymmetry, speech difficulty, weakness, light-headedness, numbness and headaches.  Psychiatric/Behavioral: Positive for agitation, behavioral problems and confusion.    Immunization History  Administered Date(s) Administered  . Influenza, High Dose Seasonal PF 06/19/2018  . Influenza-Unspecified 06/27/2016  . Pneumococcal Conjugate-13 07/21/2013, 04/15/2016  . Pneumococcal Polysaccharide-23 03/14/2010  . Td 03/09/2010  . Tdap 12/19/2016  . Zoster 08/02/2005, 03/14/2010  . Zoster Recombinat (Shingrix) 06/18/2017, 10/22/2017   Pertinent  Health Maintenance Due  Topic Date Due  . FOOT EXAM  02/12/1951  . OPHTHALMOLOGY EXAM  02/12/1951  . HEMOGLOBIN A1C  05/14/2018  . URINE MICROALBUMIN  11/12/2018  . INFLUENZA VACCINE  Completed  . DEXA SCAN  Completed  . PNA vac Low Risk Adult  Completed   Fall Risk  10/02/2018  Falls in the past year? 0  Number falls in past yr: 0  Injury with Fall? 0  Follow up Falls evaluation  completed;Education provided;Falls prevention discussed   Functional Status Survey:    Vitals:   11/27/18 1540  Weight: 155 lb 12.8 oz (70.7 kg)   Body mass index is 25.93 kg/m.  Wt Readings from Last 3 Encounters:  11/27/18 155 lb 12.8 oz (70.7 kg)  10/29/18 151 lb 3.2 oz (68.6 kg)  10/19/18 158 lb 12.8 oz (72 kg)   Physical Exam Vitals signs and nursing note reviewed.  Constitutional:      General: She is not in acute distress.    Appearance: She is not diaphoretic.  HENT:     Head: Normocephalic and atraumatic.  Neck:     Vascular: No JVD.  Cardiovascular:     Rate and Rhythm: Normal rate and regular rhythm.     Heart sounds: No murmur.  Pulmonary:     Effort: Pulmonary effort is normal. No respiratory distress.  Breath sounds: Normal breath sounds. No wheezing.  Abdominal:     General: Abdomen is flat. Bowel sounds are normal.     Palpations: Abdomen is soft.  Genitourinary:    Pubic Area: Rash present.     Labia:        Right: Rash present. No tenderness, lesion or injury.        Left: Rash present. No tenderness, lesion or injury.     Musculoskeletal:     Right lower leg: No edema.     Left lower leg: No edema.  Skin:    General: Skin is warm and dry.  Neurological:     General: No focal deficit present.     Mental Status: She is alert. Mental status is at baseline.     Comments: Oriented x 2, repeats herself  Psychiatric:        Mood and Affect: Mood normal.     Labs reviewed: Recent Labs    09/28/18 1423 10/05/18 1350 10/12/18 1423  NA 140 141 142  K 3.7 3.8 3.5  CL 112* 111 111  CO2 21* 23 24  GLUCOSE 147* 86 77  BUN 17 12 12   CREATININE 0.97 0.83 0.87  CALCIUM 9.0 9.2 9.0   Recent Labs    09/28/18 1423 10/05/18 1350 10/12/18 1423  AST 10* 14* 16  ALT 10 12 16   ALKPHOS 46 49 44  BILITOT 0.5 0.5 0.3  PROT 6.3* 6.2* 6.1*  ALBUMIN 3.5 3.6 3.3*   Recent Labs    09/28/18 1423 10/05/18 1350 10/12/18 1423  WBC 19.2* 13.1* 8.0   NEUTROABS 4.4 3.1 3.2  HGB 13.6 13.4 12.6  HCT 41.2 40.1 38.0  MCV 100.7* 100.5* 102.4*  PLT 175 148* 125*   Lab Results  Component Value Date   TSH 1.78 11/11/2017   Lab Results  Component Value Date   HGBA1C 5.6 11/11/2017   Lab Results  Component Value Date   CHOL 146 11/11/2017   HDL 42 11/11/2017   LDLCALC 63 11/11/2017   TRIG 207 (A) 11/11/2017    Significant Diagnostic Results in last 30 days:  No results found.  Assessment/Plan 1. Anal cancer [facility]) S/p resection and radiation. No further treatment recommended per pt/family wishes. Continue supportive hospice care. She has prn dilaudid ordered but has not needed it in several days.  2. Late onset Alzheimer's disease with behavioral disturbance ([place]) She has had periods of psychosis with paranoia and aggression but this is well controlled at this time with risperdal. Would not taper.  Consider discontinuing namenda.   3. CLL (chronic lymphocytic leukemia) ([place]) Most recent labs have normalized. Nor further treatment indicated due to current state of health.   4. Spinal stenosis of lumbar region with neurogenic claudication S/p laminectomy. Continue scheduled tylenol and neurontin.  5. Depression, recurrent ([place]) Has had difficulty after the death of her husband as well as underlying dementia related issues. Would continue wellbutrin and not taper. Continue scheduled and prn ativan.   6. Urinary retention Possibly due to immobility, radiation, and medications Will discontinue foley and attempt voiding trial  7. Vaginal candidiasis Diflucan 150 mg x 1 dose   Family/ staff Communication: nurse  Labs/tests ordered: NA

## 2018-12-21 ENCOUNTER — Other Ambulatory Visit: Payer: Self-pay

## 2018-12-21 ENCOUNTER — Non-Acute Institutional Stay (SKILLED_NURSING_FACILITY): Payer: Medicare Other | Admitting: Adult Health

## 2018-12-21 ENCOUNTER — Encounter: Payer: Self-pay | Admitting: Adult Health

## 2018-12-21 DIAGNOSIS — H543 Unqualified visual loss, both eyes: Secondary | ICD-10-CM

## 2018-12-21 DIAGNOSIS — G8929 Other chronic pain: Secondary | ICD-10-CM

## 2018-12-21 DIAGNOSIS — M545 Low back pain: Secondary | ICD-10-CM | POA: Diagnosis not present

## 2018-12-21 DIAGNOSIS — F339 Major depressive disorder, recurrent, unspecified: Secondary | ICD-10-CM | POA: Diagnosis not present

## 2018-12-21 DIAGNOSIS — G301 Alzheimer's disease with late onset: Secondary | ICD-10-CM | POA: Diagnosis not present

## 2018-12-21 DIAGNOSIS — I480 Paroxysmal atrial fibrillation: Secondary | ICD-10-CM

## 2018-12-21 DIAGNOSIS — R350 Frequency of micturition: Secondary | ICD-10-CM | POA: Diagnosis not present

## 2018-12-21 DIAGNOSIS — F02818 Dementia in other diseases classified elsewhere, unspecified severity, with other behavioral disturbance: Secondary | ICD-10-CM

## 2018-12-21 DIAGNOSIS — F0281 Dementia in other diseases classified elsewhere with behavioral disturbance: Secondary | ICD-10-CM

## 2018-12-21 DIAGNOSIS — R600 Localized edema: Secondary | ICD-10-CM

## 2018-12-21 NOTE — Progress Notes (Signed)
Location:  [facility]up[facility] psychologist of Service:  SNF (31) Provider:   Cindi Carbon, [place] [phone]   Royal Hawthorn, NP  Patient Care Team: Royal Hawthorn, NP as PCP - General (Nurse Practitioner) Sherren Mocha, MD as PCP - Cardiology (Cardiology) Crist Infante, MD (Internal Medicine) Newton Pigg, MD as Consulting Physician (Obstetrics and Gynecology) Truitt Merle, MD as Consulting Physician (Hematology) Leta Baptist, MD as Consulting Physician (Otolaryngology) Sherlynn Stalls, MD as Consulting Physician (Ophthalmology) Paulla Dolly Tamala Fothergill, DPM as Consulting Physician (Podiatry) Harriett Sine, MD as Consulting Physician (Dermatology) Erroll Luna, MD as Consulting Physician (General Surgery)  Extended Emergency Contact Information Primary Emergency Contact: [place], [place] Phone: [phone] Work Phone: [phone] Relation: None Secondary Emergency Contact: Dorris Singh Mobile Phone: [phone] Relation: Other  Code Status: DNR Goals of care: Advanced Directive information Advanced Directives 10/13/2018  Does Patient Have a Medical Advance Directive? Yes  Type of Advance Directive Out of facility DNR (pink MOST or yellow form);[place];Living will  Does patient want to make changes to medical advance directive? No - Patient declined  Copy of [place] in Chart? Yes - validated most recent copy scanned in chart (See row information)  Would patient like information on creating a medical advance directive? -  Pre-existing out of facility DNR order (yellow form or pink MOST form) Yellow form placed in chart (order not valid for inpatient use)     Chief Complaint  Patient presents with  . Acute Visit    frequency  . Medical Management of Chronic Issues    HPI:  Pt is a 78 y.o. female seen today for an acute visit for urinary frequency and for medical management of chronic  diseases.    She reports that she has burning and frequency present for 24 hrs. She had a catheter last month for urinary retention which was removed after she passed voiding trials. She denies any bladder pain or fever.   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  On 12/11 a pet scan was obtained which showed a hypermetabolic inguinal lymph node which returned positive by bx.  She was treated with chemo and radiation in January. She decided to stop all treatment in Feb due to intolerance to treatment. Since that time there have been no new complaints regarding this issue.   She has chronic low back and has a hx of lumbar spinal stenosis and is s/p laminectomy one year ago. Her pain is well controlled She reports edema in both feet with normal blood pressure readings. No chest pain, sob, or doe.  She has been working with PT and ambulating in the hallway. She would like to leave rehab and go to assisted living in the future with 24 hr caregivers.    Past Medical History:  Diagnosis Date  . Anxiety   . Arthritis   . Cerebral aneurysm    s/p endovascular colling 2008  . CLL (chronic lymphocytic leukemia) (HCC)    CLL  . Depression   . Hearing loss   . Macular degeneration   . Neuropathy   . Paroxysmal atrial fibrillation (HCC)   . Pneumonia    08-04-17  . PONV (postoperative nausea and vomiting)   . Thyroid disease    Past Surgical History:  Procedure Laterality Date  . ANEURYSM COILING    . CATARACT EXTRACTION  bilateral  . CERVICAL CONE BIOPSY    . COLONOSCOPY    . EVALUATION UNDER ANESTHESIA WITH HEMORRHOIDECTOMY N/A 09/03/2017   Procedure: EXAM UNDER ANESTHESIA, EXCISION OF ANAL LESION, POSSIBLE HEMORRHOIDECTOMY;  Surgeon: Ileana Roup, MD;  Location: [facility];  Service: General;  Laterality: N/A;  . LESION REMOVAL N/A 07/22/2018   Procedure: EXCISION OF PERIANAL LESION;  Surgeon:  Ileana Roup, MD;  Location: [facility];  Service: General;  Laterality: N/A;  . LUMBAR LAMINECTOMY/DECOMPRESSION MICRODISCECTOMY Right 04/07/2018   Procedure: Right Lumbar Four-Five Lumbar Five-Sacral One Laminectomy/Microdiscectomy;  Surgeon: Kristeen Miss, MD;  Location: [facility];  Service: Neurosurgery;  Laterality: Right;  Right Lumbar Four-Five Lumbar Five-Sacral One Laminectomy/Microdiscectomy  . RECTAL EXAM UNDER ANESTHESIA N/A 07/22/2018   Procedure: ANORECTAL EXAM UNDER ANESTHESIA ERAS PATHWAY;  Surgeon: Ileana Roup, MD;  Location: [facility];  Service: General;  Laterality: N/A;  . THYROIDECTOMY    . TONSILLECTOMY      Allergies  Allergen Reactions  . Ambien [Zolpidem Tartrate] Other (See Comments)    UNSPECIFIED REACTION   . Fioricet-Codeine [Butalbital-Apap-Caff-Cod] Other (See Comments)    UNSPECIFIED REACTION   . Macrobid [Nitrofurantoin] Other (See Comments)    UNSPECIFIED REACTION   . Codeine Other (See Comments)    About 50 years ago, took Codeine for scratched cornea while pregnant, and it made her extremely sedated.  . Hydrocodone-Acetaminophen Nausea And Vomiting  . Keflex [Cephalexin] Rash  . Percocet [Oxycodone-Acetaminophen] Nausea And Vomiting    Outpatient Encounter Medications as of 12/21/2018  Medication Sig  . acetaminophen (TYLENOL) 500 MG tablet Take 500 mg by mouth every 8 (eight) hours.   Marland Kitchen buPROPion (WELLBUTRIN XL) 150 MG 24 hr tablet Take 150 mg by mouth daily. Take with the 300mg  tablet  . buPROPion (WELLBUTRIN XL) 300 MG 24 hr tablet Take 450 mg by mouth daily. Take with the 150mg  tablet  . Calcium Carbonate-Vitamin D 600-400 MG-UNIT per tablet Take 1 tablet by mouth daily.  . COMBIGAN 0.2-0.5 % ophthalmic solution Place 1 drop into the left eye 2 (two) times daily.   Marland Kitchen diltiazem (CARDIZEM CD) 180 MG 24 hr capsule Take 180 mg by mouth daily.  Marland Kitchen donepezil (ARICEPT) 5 MG tablet Take 5 mg by mouth at bedtime.  . dorzolamide (TRUSOPT) 2 % ophthalmic  solution   . estrogen, conjugated,-medroxyprogesterone (PREMPRO) 0.625-2.5 MG tablet Take 1 tablet by mouth daily.  Marland Kitchen gabapentin (NEURONTIN) 100 MG capsule Take 100 mg by mouth 2 (two) times daily.  . hydrocortisone (ANUSOL-HC) 25 MG suppository Place 25 mg rectally 2 (two) times daily as needed for hemorrhoids or anal itching.  Marland Kitchen HYDROmorphone (DILAUDID) 2 MG tablet Take 0.5 tablets (1 mg total) by mouth every 12 (twelve) hours as needed for severe pain.  Marland Kitchen ibuprofen (ADVIL,MOTRIN) 400 MG tablet Take 1 tablet (400 mg total) by mouth every 8 (eight) hours as needed for moderate pain.  Marland Kitchen lamoTRIgine (LAMICTAL) 25 MG tablet Take 25 mg by mouth 2 (two) times daily. 25 mg in the am and 50 mg in the evening  . latanoprost (XALATAN) 0.005 % ophthalmic solution Place 1 drop into the left eye at bedtime.   . Lidocaine (ASPERCREME LIDOCAINE) 4 % PTCH Apply 1 patch topically daily as needed (for lower back / right hip pain).  . LORazepam (ATIVAN) 0.5 MG tablet Take 0.5 mg by mouth daily as needed for anxiety.  Marland Kitchen LORazepam (ATIVAN) 1 MG tablet Take 1 tablet (1 mg total) by mouth 2 (two)  times daily.  . meclizine (ANTIVERT) 25 MG tablet Take 25 mg by mouth 2 (two) times daily as needed for dizziness.  . Melatonin 5 MG TABS Take 5 mg by mouth at bedtime.  . memantine (NAMENDA) 5 MG tablet Take 5 mg by mouth daily.  . mirabegron ER (MYRBETRIQ) 50 MG TB24 tablet Take 50 mg by mouth daily.  . Multiple Vitamins-Minerals (PRESERVISION AREDS) CAPS Take 1 capsule by mouth daily.  . ondansetron (ZOFRAN) 8 MG tablet Take 1 tablet (8 mg total) by mouth 2 (two) times daily as needed (Nausea or vomiting).  . polyethylene glycol (MIRALAX / GLYCOLAX) packet Take 17 g by mouth daily as needed for moderate constipation.  . pravastatin (PRAVACHOL) 40 MG tablet Take 40 mg by mouth daily.   . Probiotic Product (ALIGN) 4 MG CAPS Take 4 mg by mouth daily.   . prochlorperazine (COMPAZINE) 10 MG tablet Take 1 tablet (10 mg total)  by mouth every 6 (six) hours as needed (Nausea or vomiting).  . risperiDONE (RISPERDAL) 0.25 MG tablet Take 0.25 mg by mouth 2 (two) times daily.  Marland Kitchen senna-docusate (SENOKOT-S) 8.6-50 MG tablet Take 2 tablets by mouth at bedtime.  Marland Kitchen trimethoprim (TRIMPEX) 100 MG tablet Take 100 mg by mouth daily.  . Wheat Dextrin (BENEFIBER PO) Take 1 each by mouth See admin instructions. Mix 1 tablespoon twice daily.  . [DISCONTINUED] nystatin-triamcinolone (MYCOLOG II) cream Apply 1 application topically 2 (two) times daily.   No facility-administered encounter medications on file as of 12/21/2018.     Review of Systems  Constitutional: Negative for activity change, appetite change, chills, diaphoresis, fatigue, fever and unexpected weight change.  HENT: Negative for congestion.   Respiratory: Negative for cough, shortness of breath and wheezing.   Cardiovascular: Positive for leg swelling. Negative for chest pain and palpitations.  Gastrointestinal: Negative for abdominal distention, abdominal pain, constipation and diarrhea.  Genitourinary: Positive for dysuria, frequency and urgency. Negative for decreased urine volume, difficulty urinating, flank pain, genital sores, hematuria, pelvic pain and vaginal bleeding.  Musculoskeletal: Positive for gait problem. Negative for arthralgias, back pain, joint swelling and myalgias.  Neurological: Negative for dizziness, tremors, seizures, syncope, facial asymmetry, speech difficulty, weakness, light-headedness, numbness and headaches.  Psychiatric/Behavioral: Positive for agitation, behavioral problems and confusion.    Immunization History  Administered Date(s) Administered  . Influenza, High Dose Seasonal PF 06/19/2018  . Influenza-Unspecified 06/27/2016  . Pneumococcal Conjugate-13 07/21/2013, 04/15/2016  . Pneumococcal Polysaccharide-23 03/14/2010  . Td 03/09/2010  . Tdap 12/19/2016  . Zoster 08/02/2005, 03/14/2010  . Zoster Recombinat (Shingrix)  06/18/2017, 10/22/2017   Pertinent  Health Maintenance Due  Topic Date Due  . FOOT EXAM  02/12/1951  . OPHTHALMOLOGY EXAM  02/12/1951  . HEMOGLOBIN A1C  05/14/2018  . URINE MICROALBUMIN  11/12/2018  . INFLUENZA VACCINE  04/03/2019  . DEXA SCAN  Completed  . PNA vac Low Risk Adult  Completed   Fall Risk  10/02/2018  Falls in the past year? 0  Number falls in past yr: 0  Injury with Fall? 0  Follow up Falls evaluation completed;Education provided;Falls prevention discussed   Functional Status Survey:    Vitals:   12/21/18 1446  Temp: 98.2 F (36.8 C)   There is no height or weight on file to calculate BMI. Physical Exam Constitutional:      Appearance: Normal appearance.  HENT:     Head: Normocephalic and atraumatic.  Cardiovascular:     Rate and Rhythm: Normal rate.  Heart sounds: No murmur.  Pulmonary:     Effort: Pulmonary effort is normal. No respiratory distress.     Breath sounds: Normal breath sounds.  Abdominal:     General: Abdomen is flat. Bowel sounds are normal. There is no distension.     Palpations: Abdomen is soft.     Tenderness: There is no right CVA tenderness or left CVA tenderness.  Musculoskeletal:     Right lower leg: Edema present.     Left lower leg: Edema present.  Skin:    General: Skin is warm and dry.  Neurological:     General: No focal deficit present.     Mental Status: She is alert. Mental status is at baseline.     Comments: Oriented x 2 , MAE  Psychiatric:     Comments: teaful     Labs reviewed: Recent Labs    09/28/18 1423 10/05/18 1350 10/12/18 1423  NA 140 141 142  K 3.7 3.8 3.5  CL 112* 111 111  CO2 21* 23 24  GLUCOSE 147* 86 77  BUN 17 12 12   CREATININE 0.97 0.83 0.87  CALCIUM 9.0 9.2 9.0   Recent Labs    09/28/18 1423 10/05/18 1350 10/12/18 1423  AST 10* 14* 16  ALT 10 12 16   ALKPHOS 46 49 44  BILITOT 0.5 0.5 0.3  PROT 6.3* 6.2* 6.1*  ALBUMIN 3.5 3.6 3.3*   Recent Labs    09/28/18 1423  10/05/18 1350 10/12/18 1423  WBC 19.2* 13.1* 8.0  NEUTROABS 4.4 3.1 3.2  HGB 13.6 13.4 12.6  HCT 41.2 40.1 38.0  MCV 100.7* 100.5* 102.4*  PLT 175 148* 125*   Lab Results  Component Value Date   TSH 1.78 11/11/2017   Lab Results  Component Value Date   HGBA1C 5.6 11/11/2017   Lab Results  Component Value Date   CHOL 146 11/11/2017   HDL 42 11/11/2017   LDLCALC 63 11/11/2017   TRIG 207 (A) 11/11/2017    Significant Diagnostic Results in last 30 days:  No results found.  Assessment/Plan 1. Urinary frequency BSC UA C and S  PVR  2. Chronic bilateral low back pain without sciatica Controlled, continue prn dilaudid and scheduled tylenol. Continue salonpas patch prn  3. Late onset Alzheimer's disease with behavioral disturbance ([place]) Moderate  Continue Namenda  Continue supportive care in the skilled setting  4. Depression, recurrent ([place]) Teaful today as her husbands birthday was yesterday. Her mood is quite variable with periods of tearfulness and periods of agitation. Due to this would not taper wellbutrin, ativan, or risperdal  5. PAF (paroxysmal atrial fibrillation) (HCC) Controlled, continue Cardizem Not on anticoagulation due to debility, hospice status, fall risk, and previous brain surgery  6. Low vision, both eyes Contributes to her fall risk and increased need for assistance Currently on Combigan and Xalantan gtts, will see Dr. Bing Plume in June  7. Localized edema Compression hose on in the am and off in the pm    Family/ staff Communication: discussed with staff/resident  Labs/tests ordered:  UA C and S and PVR

## 2018-12-24 ENCOUNTER — Other Ambulatory Visit: Payer: Self-pay | Admitting: Adult Health

## 2018-12-24 DIAGNOSIS — F418 Other specified anxiety disorders: Secondary | ICD-10-CM

## 2018-12-24 MED ORDER — LORAZEPAM 1 MG PO TABS: 1.0000 mg | ORAL_TABLET | Freq: Two times a day (BID) | ORAL | 2 refills | Status: DC

## 2018-12-29 ENCOUNTER — Encounter: Payer: Self-pay | Admitting: Internal Medicine

## 2018-12-29 ENCOUNTER — Non-Acute Institutional Stay (SKILLED_NURSING_FACILITY): Payer: Medicare Other | Admitting: Internal Medicine

## 2018-12-29 DIAGNOSIS — N3000 Acute cystitis without hematuria: Secondary | ICD-10-CM | POA: Diagnosis not present

## 2018-12-29 DIAGNOSIS — H543 Unqualified visual loss, both eyes: Secondary | ICD-10-CM

## 2018-12-29 DIAGNOSIS — E1122 Type 2 diabetes mellitus with diabetic chronic kidney disease: Secondary | ICD-10-CM

## 2018-12-29 DIAGNOSIS — N183 Chronic kidney disease, stage 3 unspecified: Secondary | ICD-10-CM

## 2018-12-29 DIAGNOSIS — M545 Low back pain, unspecified: Secondary | ICD-10-CM

## 2018-12-29 DIAGNOSIS — G8929 Other chronic pain: Secondary | ICD-10-CM

## 2018-12-29 DIAGNOSIS — I129 Hypertensive chronic kidney disease with stage 1 through stage 4 chronic kidney disease, or unspecified chronic kidney disease: Secondary | ICD-10-CM

## 2018-12-29 DIAGNOSIS — F339 Major depressive disorder, recurrent, unspecified: Secondary | ICD-10-CM

## 2018-12-29 DIAGNOSIS — G301 Alzheimer's disease with late onset: Secondary | ICD-10-CM | POA: Diagnosis not present

## 2018-12-29 DIAGNOSIS — C210 Malignant neoplasm of anus, unspecified: Secondary | ICD-10-CM

## 2018-12-29 DIAGNOSIS — F02818 Dementia in other diseases classified elsewhere, unspecified severity, with other behavioral disturbance: Secondary | ICD-10-CM

## 2018-12-29 DIAGNOSIS — F0281 Dementia in other diseases classified elsewhere with behavioral disturbance: Secondary | ICD-10-CM

## 2018-12-29 NOTE — Progress Notes (Signed)
Patient ID: Bethany Oconnor, female   DOB: [birth year], 78 y.o.   MRN: [id number]  Location:  [facility] Room Number: 115 Place of Service:  SNF (31) Provider:  Kamyla Olejnik L. Mariea Clonts, D.O., C.M.D.  Gayland Curry, DO  Patient Care Team: Gayland Curry, DO as PCP - General (Geriatric Medicine) Sherren Mocha, MD as PCP - Cardiology (Cardiology) Crist Infante, MD (Internal Medicine) Newton Pigg, MD as Consulting Physician (Obstetrics and Gynecology) Truitt Merle, MD as Consulting Physician (Hematology) Leta Baptist, MD as Consulting Physician (Otolaryngology) Sherlynn Stalls, MD as Consulting Physician (Ophthalmology) Paulla Dolly Tamala Fothergill, DPM as Consulting Physician (Podiatry) Harriett Sine, MD as Consulting Physician (Dermatology) Erroll Luna, MD as Consulting Physician (General Surgery)  Extended Emergency Contact Information Primary Emergency Contact: [place], [place] Phone: [phone] Work Phone: [phone] Relation: None Secondary Emergency Contact: Dorris Singh Mobile Phone: [phone] Relation: Other  Code Status:  DNR, MOST, hospice Goals of care: Advanced Directive information Advanced Directives 12/29/2018  Does Patient Have a Medical Advance Directive? Yes  Type of Paramedic of [place];Living will;Out of facility DNR (pink MOST or yellow form)  Does patient want to make changes to medical advance directive? No - Patient declined  Copy of [place] in Chart? Yes - validated most recent copy scanned in chart (See row information)  Would patient like information on creating a medical advance directive? -  Pre-existing out of facility DNR order (yellow form or pink MOST form) Yellow form placed in chart (order not valid for inpatient use);Pink MOST form placed in chart (order not valid for inpatient use)     Chief Complaint  Patient presents with  . Medical Management of Chronic  Issues    Routine Visit    HPI:  Pt is a 78 y.o. female seen today for medical management of chronic diseases.    Resident is noted to be more content now that she's made a decision to move to a double room on [place] (finally giving up her [place] apt).  Spirits improved, she's more pleasant and sociable.    She was treated for UTI recently with fosfomycin after dysuria and some low grade temperatures--sample obtained 4/20.  She's been afebrile since treatment.    Bowels require miralax occasionally for regular movements with her dilaudid which is prn now.    Her pain is now well-controlled.  She remains on hormone therapy at 77yo and has previously refused discontinuation for fear of hot flashes.     Past Medical History:  Diagnosis Date  . Anxiety   . Arthritis   . Cerebral aneurysm    s/p endovascular colling 2008  . CLL (chronic lymphocytic leukemia) (HCC)    CLL  . Depression   . Hearing loss   . Macular degeneration   . Neuropathy   . Paroxysmal atrial fibrillation (HCC)   . Pneumonia    08-04-17  . PONV (postoperative nausea and vomiting)   . Thyroid disease    Past Surgical History:  Procedure Laterality Date  . ANEURYSM COILING    . CATARACT EXTRACTION     bilateral  . CERVICAL CONE BIOPSY    . COLONOSCOPY    . EVALUATION UNDER ANESTHESIA WITH HEMORRHOIDECTOMY N/A 09/03/2017   Procedure: EXAM UNDER ANESTHESIA, EXCISION OF ANAL LESION, POSSIBLE HEMORRHOIDECTOMY;  Surgeon: Ileana Roup, MD;  Location: [facility];  Service: General;  Laterality: N/A;  . LESION REMOVAL N/A 07/22/2018   Procedure: EXCISION OF PERIANAL LESION;  Surgeon: Ileana Roup, MD;  Location: [facility];  Service: General;  Laterality: N/A;  . LUMBAR LAMINECTOMY/DECOMPRESSION MICRODISCECTOMY Right 04/07/2018   Procedure: Right Lumbar Four-Five Lumbar Five-Sacral One Laminectomy/Microdiscectomy;  Surgeon: Kristeen Miss, MD;  Location: [facility];  Service: Neurosurgery;  Laterality: Right;  Right Lumbar  Four-Five Lumbar Five-Sacral One Laminectomy/Microdiscectomy  . RECTAL EXAM UNDER ANESTHESIA N/A 07/22/2018   Procedure: ANORECTAL EXAM UNDER ANESTHESIA ERAS PATHWAY;  Surgeon: Ileana Roup, MD;  Location: [facility];  Service: General;  Laterality: N/A;  . THYROIDECTOMY    . TONSILLECTOMY      Allergies  Allergen Reactions  . Ambien [Zolpidem Tartrate] Other (See Comments)    UNSPECIFIED REACTION   . Fioricet-Codeine [Butalbital-Apap-Caff-Cod] Other (See Comments)    UNSPECIFIED REACTION   . Macrobid [Nitrofurantoin] Other (See Comments)    UNSPECIFIED REACTION   . Codeine Other (See Comments)    About 50 years ago, took Codeine for scratched cornea while pregnant, and it made her extremely sedated.  . Hydrocodone-Acetaminophen Nausea And Vomiting  . Keflex [Cephalexin] Rash  . Percocet [Oxycodone-Acetaminophen] Nausea And Vomiting    Outpatient Encounter Medications as of 12/29/2018  Medication Sig  . acetaminophen (TYLENOL) 500 MG tablet Take 500 mg by mouth every 8 (eight) hours.   Marland Kitchen buPROPion (WELLBUTRIN XL) 150 MG 24 hr tablet Take 150 mg by mouth daily. Take with the 300mg  tablet  . buPROPion (WELLBUTRIN XL) 300 MG 24 hr tablet Take 450 mg by mouth daily. Take with the 150mg  tablet  . COMBIGAN 0.2-0.5 % ophthalmic solution Place 1 drop into the left eye 2 (two) times daily.   Marland Kitchen diltiazem (CARDIZEM CD) 180 MG 24 hr capsule Take 180 mg by mouth daily.  Marland Kitchen donepezil (ARICEPT) 5 MG tablet Take 5 mg by mouth at bedtime.  . dorzolamide (TRUSOPT) 2 % ophthalmic solution   . estrogen, conjugated,-medroxyprogesterone (PREMPRO) 0.625-2.5 MG tablet Take 1 tablet by mouth daily.  Marland Kitchen gabapentin (NEURONTIN) 100 MG capsule Take 100 mg by mouth 2 (two) times daily.  . hydrocortisone (ANUSOL-HC) 25 MG suppository Place 25 mg rectally 2 (two) times daily as needed for hemorrhoids or anal itching.  Marland Kitchen HYDROmorphone (DILAUDID) 2 MG tablet Take 0.5 tablets (1 mg total) by mouth every 12 (twelve)  hours as needed for severe pain.  Marland Kitchen ibuprofen (ADVIL,MOTRIN) 400 MG tablet Take 1 tablet (400 mg total) by mouth every 8 (eight) hours as needed for moderate pain.  Marland Kitchen lamoTRIgine (LAMICTAL) 100 MG tablet   . latanoprost (XALATAN) 0.005 % ophthalmic solution Place 1 drop into the left eye at bedtime.   . Lidocaine (ASPERCREME LIDOCAINE) 4 % PTCH Apply 1 patch topically daily as needed (for lower back / right hip pain).  Marland Kitchen lidocaine (XYLOCAINE) 2 % jelly Apply 1 application topically every 8 (eight) hours as needed.  Marland Kitchen LORazepam (ATIVAN) 1 MG tablet Take 1 tablet (1 mg total) by mouth 2 (two) times daily.  . meclizine (ANTIVERT) 25 MG tablet Take 25 mg by mouth 2 (two) times daily as needed for dizziness.  . Melatonin 5 MG TABS Take 5 mg by mouth at bedtime.  . memantine (NAMENDA) 5 MG tablet Take 5 mg by mouth daily.  . mirabegron ER (MYRBETRIQ) 50 MG TB24 tablet Take 50 mg by mouth daily.  . ondansetron (ZOFRAN) 8 MG tablet Take 1 tablet (8 mg total) by mouth 2 (two) times daily as needed (Nausea or vomiting).  . polyethylene glycol (MIRALAX / GLYCOLAX) packet Take 17 g  by mouth daily as needed for moderate constipation.  . Probiotic Product (ALIGN) 4 MG CAPS Take 4 mg by mouth daily.   . prochlorperazine (COMPAZINE) 10 MG tablet Take 1 tablet (10 mg total) by mouth every 6 (six) hours as needed (Nausea or vomiting).  . risperiDONE (RISPERDAL) 0.25 MG tablet Take 0.25 mg by mouth 2 (two) times daily.  Marland Kitchen senna-docusate (SENOKOT-S) 8.6-50 MG tablet Take 2 tablets by mouth at bedtime.  . Wheat Dextrin (BENEFIBER PO) Take 1 each by mouth 2 (two) times a day.   . [DISCONTINUED] Calcium Carbonate-Vitamin D 600-400 MG-UNIT per tablet Take 1 tablet by mouth daily.  . [DISCONTINUED] lamoTRIgine (LAMICTAL) 25 MG tablet Take 25 mg by mouth 2 (two) times daily. 25 mg in the am and 50 mg in the evening  . [DISCONTINUED] LORazepam (ATIVAN) 0.5 MG tablet Take 0.5 mg by mouth daily as needed for anxiety.  .  [DISCONTINUED] Multiple Vitamins-Minerals (PRESERVISION AREDS) CAPS Take 1 capsule by mouth daily.  . [DISCONTINUED] pravastatin (PRAVACHOL) 40 MG tablet Take 40 mg by mouth daily.   . [DISCONTINUED] trimethoprim (TRIMPEX) 100 MG tablet Take 100 mg by mouth daily.   No facility-administered encounter medications on file as of 12/29/2018.     Review of Systems  Constitutional: Positive for fatigue. Negative for activity change, appetite change, chills and fever.  HENT: Negative for congestion and trouble swallowing.   Eyes:       Low vision  Respiratory: Negative for cough and shortness of breath.   Cardiovascular: Negative for chest pain and palpitations.  Gastrointestinal: Positive for constipation. Negative for abdominal pain, blood in stool, diarrhea, nausea, rectal pain and vomiting.  Genitourinary: Positive for vaginal pain. Negative for dysuria, vaginal bleeding and vaginal discharge.  Musculoskeletal: Positive for gait problem. Negative for arthralgias and back pain.  Skin: Positive for pallor. Negative for color change.    Immunization History  Administered Date(s) Administered  . Influenza, High Dose Seasonal PF 06/19/2018  . Influenza-Unspecified 06/27/2016  . Pneumococcal Conjugate-13 07/21/2013, 04/15/2016  . Pneumococcal Polysaccharide-23 03/14/2010  . Td 03/09/2010  . Tdap 12/19/2016  . Zoster 08/02/2005, 03/14/2010  . Zoster Recombinat (Shingrix) 06/18/2017, 10/22/2017   Pertinent  Health Maintenance Due  Topic Date Due  . OPHTHALMOLOGY EXAM  02/12/1951  . HEMOGLOBIN A1C  05/14/2018  . URINE MICROALBUMIN  11/12/2018  . INFLUENZA VACCINE  04/03/2019  . FOOT EXAM  12/21/2019  . DEXA SCAN  Completed  . PNA vac Low Risk Adult  Completed   Fall Risk  10/02/2018  Falls in the past year? 0  Number falls in past yr: 0  Injury with Fall? 0  Follow up Falls evaluation completed;Education provided;Falls prevention discussed   Functional Status Survey:    Vitals:    12/29/18 1021  BP: 110/65  Pulse: 78  Resp: 19  Temp: (!) 96.9 F (36.1 C)  TempSrc: Oral  SpO2: 96%  Weight: 152 lb (68.9 kg)  Height: 5\' 5"  (1.651 m)   Body mass index is 25.29 kg/m. Physical Exam Constitutional:      General: She is not in acute distress.    Appearance: She is normal weight. She is not toxic-appearing.  HENT:     Head: Normocephalic and atraumatic.  Cardiovascular:     Rate and Rhythm: Normal rate and regular rhythm.     Pulses: Normal pulses.  Pulmonary:     Effort: Pulmonary effort is normal.     Breath sounds: Normal breath sounds.  Abdominal:  General: Bowel sounds are normal.  Musculoskeletal: Normal range of motion.  Skin:    General: Skin is warm and dry.     Capillary Refill: Capillary refill takes less than 2 seconds.     Coloration: Skin is pale.  Neurological:     Mental Status: She is alert. Mental status is at baseline.  Psychiatric:     Comments: Pleasant and more talkative     Labs reviewed: Recent Labs    09/28/18 1423 10/05/18 1350 10/12/18 1423  NA 140 141 142  K 3.7 3.8 3.5  CL 112* 111 111  CO2 21* 23 24  GLUCOSE 147* 86 77  BUN 17 12 12   CREATININE 0.97 0.83 0.87  CALCIUM 9.0 9.2 9.0   Recent Labs    09/28/18 1423 10/05/18 1350 10/12/18 1423  AST 10* 14* 16  ALT 10 12 16   ALKPHOS 46 49 44  BILITOT 0.5 0.5 0.3  PROT 6.3* 6.2* 6.1*  ALBUMIN 3.5 3.6 3.3*   Recent Labs    09/28/18 1423 10/05/18 1350 10/12/18 1423  WBC 19.2* 13.1* 8.0  NEUTROABS 4.4 3.1 3.2  HGB 13.6 13.4 12.6  HCT 41.2 40.1 38.0  MCV 100.7* 100.5* 102.4*  PLT 175 148* 125*   Lab Results  Component Value Date   TSH 1.78 11/11/2017   Lab Results  Component Value Date   HGBA1C 5.6 11/11/2017   Lab Results  Component Value Date   CHOL 146 11/11/2017   HDL 42 11/11/2017   LDLCALC 63 11/11/2017   TRIG 207 (A) 11/11/2017    Assessment/Plan 1. 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  2. Acute cystitis without hematuria -resolved with fosfomycin  3. Chronic bilateral low back pain without sciatica -pain under good control with less dilaudid which has helped her cognitively -cont same regimen above  4. Late onset Alzheimer's disease with behavioral disturbance ([place]) -has had cognitive decline over the past 2-3 years -was complicated by delirium amid her anal cancer treatments and pain regimen -cont to monitor -will have med mgt in [place]  5. Depression, recurrent ([place]) -actually doing better now than she had -cont same medication regimen managed by psychiatry  6. Low vision, both eyes -ongoing contributor to functional decline -is going to move to [place] soon which has brightened her spirits  7. Anal cancer ([place]) -off radiation, chemo, etc. -on comfort care with hospice -cont current regimen which is effective  Family/ staff Communication: discussed with SNF nurse  Labs/tests ordered:  No new  Susannah Carbin L. Naviyah Schaffert, D.O. [facility] [address], [place] [address] Cell Phone (Mon-Fri 8am-5pm):  [phone] On Call:  [phone] & follow prompts after 5pm & weekends Office Phone:  [phone] Office Fax:  [phone]

## 2019-01-04 ENCOUNTER — Encounter: Payer: Self-pay | Admitting: Adult Health

## 2019-01-04 ENCOUNTER — Non-Acute Institutional Stay (SKILLED_NURSING_FACILITY): Payer: Medicare Other | Admitting: Adult Health

## 2019-01-04 DIAGNOSIS — R350 Frequency of micturition: Secondary | ICD-10-CM

## 2019-01-04 LAB — BASIC METABOLIC PANEL
BUN: 9 (ref 4–21)
Creatinine: 0.8 (ref 0.5–1.1)
Glucose: 120
Potassium: 3.8 (ref 3.4–5.3)
Sodium: 137 (ref 137–147)

## 2019-01-04 LAB — CBC AND DIFFERENTIAL
HCT: 36 (ref 36–46)
Hemoglobin: 35.7 — AB (ref 12.0–16.0)
Platelets: 164 (ref 150–399)
WBC: 7.9

## 2019-01-04 NOTE — Progress Notes (Signed)
Location:  Occupational psychologist of Service:  SNF (31) Provider:   Cindi Carbon, ANP [place] [phone]   Gayland Curry, DO  Patient Care Team: Gayland Curry, DO as PCP - General (Geriatric Medicine) Sherren Mocha, MD as PCP - Cardiology (Cardiology) Crist Infante, MD (Internal Medicine) Newton Pigg, MD as Consulting Physician (Obstetrics and Gynecology) Truitt Merle, MD as Consulting Physician (Hematology) Leta Baptist, MD as Consulting Physician (Otolaryngology) Sherlynn Stalls, MD as Consulting Physician (Ophthalmology) Paulla Dolly Tamala Fothergill, DPM as Consulting Physician (Podiatry) Harriett Sine, MD as Consulting Physician (Dermatology) Erroll Luna, MD as Consulting Physician (General Surgery)  Extended Emergency Contact Information Primary Emergency Contact: Holland, Ce[place] Phone: [phone] Work Phone: [phone] Relation: None Secondary Emergency Contact: Dorris Singh Mobile Phone: [phone] Relation: Other  Code Status:  DNR Goals of care: Advanced Directive information Advanced Directives 12/29/2018  Does Patient Have a Medical Advance Directive? Yes  Type of Paramedic of [place];Living will;Out of facility DNR (pink MOST or yellow form)  Does patient want to make changes to medical advance directive? No - Patient declined  Copy of [place] in Chart? Yes - validated most recent copy scanned in chart (See row information)  Would patient like information on creating a medical advance directive? -  Pre-existing out of facility DNR order (yellow form or pink MOST form) Yellow form placed in chart (order not valid for inpatient use);Pink MOST form placed in chart (order not valid for inpatient use)     Chief Complaint  Patient presents with  . Acute Visit    low grade temp, frequency    HPI:  Pt is a 78 y.o. female seen today for an acute visit for low grade temp and  urinary frequency. She has a hx of dementia and is a poor historian. She has been reporting urinary frequency periodically for the past few weeks. Initial UA in April was not positive but was taken on trimtheoprim. Due to symptoms she was treated with fosfomycin which improved frequency for a short time period. Symptoms were worse again on 5/1 and the orders were written to hold the trimethoprim for 5 days and then obtain another urine. At that time there was no fever, back pain, sob, cough, or other symptoms. Myrbetriq was discontinued as it did not seem to help with frequency. Then on 5/3 she had several low grade temps 99-99.5.  UA was sent again which was neg for nitrites, small amt of blood, small amt of bacteria, 10-20 WBC.   PVR has been checked in the past and was normal. 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 NO hematuria reported at any time.  For my visit today the resident denied dysuria or frequency initially but then later said she did have frequency. She voided 3 x on first shift. No further fever, no back pain, no change in appetite, chills, sob, cough, etc. She feels depressed she states since she lives in skilled care and her husband has passed away. CBC and BMP were ordered and are normal. Covid screen and isolation initiated with full PPE due to low grade temp.    Past Medical History:  Diagnosis Date  . Anxiety   . Arthritis   . Cerebral aneurysm    s/p endovascular colling 2008  . CLL (chronic lymphocytic leukemia) (HCC)    CLL  . Depression   .  Hearing loss   . Macular degeneration   . Neuropathy   . Paroxysmal atrial fibrillation (HCC)   . Pneumonia    08-04-17  . PONV (postoperative nausea and vomiting)   . Thyroid disease    Past Surgical History:  Procedure Laterality Date  . ANEURYSM COILING    . CATARACT EXTRACTION     bilateral  . CERVICAL CONE BIOPSY    . COLONOSCOPY    . EVALUATION UNDER ANESTHESIA  WITH HEMORRHOIDECTOMY N/A 09/03/2017   Procedure: EXAM UNDER ANESTHESIA, EXCISION OF ANAL LESION, POSSIBLE HEMORRHOIDECTOMY;  Surgeon: Ileana Roup, MD;  Location: [facility];  Service: General;  Laterality: N/A;  . LESION REMOVAL N/A 07/22/2018   Procedure: EXCISION OF PERIANAL LESION;  Surgeon: Ileana Roup, MD;  Location: [place];  Service: General;  Laterality: N/A;  . LUMBAR LAMINECTOMY/DECOMPRESSION MICRODISCECTOMY Right 04/07/2018   Procedure: Right Lumbar Four-Five Lumbar Five-Sacral One Laminectomy/Microdiscectomy;  Surgeon: Kristeen Miss, MD;  Location: [place];  Service: Neurosurgery;  Laterality: Right;  Right Lumbar Four-Five Lumbar Five-Sacral One Laminectomy/Microdiscectomy  . RECTAL EXAM UNDER ANESTHESIA N/A 07/22/2018   Procedure: ANORECTAL EXAM UNDER ANESTHESIA ERAS PATHWAY;  Surgeon: Ileana Roup, MD;  Location: [place];  Service: General;  Laterality: N/A;  . THYROIDECTOMY    . TONSILLECTOMY      Allergies  Allergen Reactions  . Ambien [Zolpidem Tartrate] Other (See Comments)    UNSPECIFIED REACTION   . Fioricet-Codeine [Butalbital-Apap-Caff-Cod] Other (See Comments)    UNSPECIFIED REACTION   . Macrobid [Nitrofurantoin] Other (See Comments)    UNSPECIFIED REACTION   . Codeine Other (See Comments)    About 50 years ago, took Codeine for scratched cornea while pregnant, and it made her extremely sedated.  . Hydrocodone-Acetaminophen Nausea And Vomiting  . Keflex [Cephalexin] Rash  . Percocet [Oxycodone-Acetaminophen] Nausea And Vomiting    Outpatient Encounter Medications as of 01/04/2019  Medication Sig  . acetaminophen (TYLENOL) 500 MG tablet Take 500 mg by mouth every 8 (eight) hours.   Marland Kitchen buPROPion (WELLBUTRIN XL) 150 MG 24 hr tablet Take 150 mg by mouth daily. Take with the 300mg  tablet  . buPROPion (WELLBUTRIN XL) 300 MG 24 hr tablet Take 450 mg by mouth daily. Take with the 150mg  tablet  . COMBIGAN 0.2-0.5 % ophthalmic solution Place 1 drop into the  left eye 2 (two) times daily.   Marland Kitchen diltiazem (CARDIZEM CD) 180 MG 24 hr capsule Take 180 mg by mouth daily.  Marland Kitchen donepezil (ARICEPT) 5 MG tablet Take 5 mg by mouth at bedtime.  . dorzolamide (TRUSOPT) 2 % ophthalmic solution   . estrogen, conjugated,-medroxyprogesterone (PREMPRO) 0.625-2.5 MG tablet Take 1 tablet by mouth daily.  Marland Kitchen gabapentin (NEURONTIN) 100 MG capsule Take 100 mg by mouth 2 (two) times daily.  . hydrocortisone (ANUSOL-HC) 25 MG suppository Place 25 mg rectally 2 (two) times daily as needed for hemorrhoids or anal itching.  Marland Kitchen HYDROmorphone (DILAUDID) 2 MG tablet Take 0.5 tablets (1 mg total) by mouth every 12 (twelve) hours as needed for severe pain.  Marland Kitchen ibuprofen (ADVIL,MOTRIN) 400 MG tablet Take 1 tablet (400 mg total) by mouth every 8 (eight) hours as needed for moderate pain.  Marland Kitchen lamoTRIgine (LAMICTAL) 100 MG tablet   . latanoprost (XALATAN) 0.005 % ophthalmic solution Place 1 drop into the left eye at bedtime.   . Lidocaine (ASPERCREME LIDOCAINE) 4 % PTCH Apply 1 patch topically daily.   Marland Kitchen lidocaine (XYLOCAINE) 2 % jelly Apply 1 application topically every 8 (eight)  hours as needed.  Marland Kitchen LORazepam (ATIVAN) 1 MG tablet Take 1 tablet (1 mg total) by mouth 2 (two) times daily.  . meclizine (ANTIVERT) 25 MG tablet Take 25 mg by mouth 2 (two) times daily as needed for dizziness.  . Melatonin 5 MG TABS Take 5 mg by mouth at bedtime.  . memantine (NAMENDA) 5 MG tablet Take 5 mg by mouth daily.  . ondansetron (ZOFRAN) 8 MG tablet Take 1 tablet (8 mg total) by mouth 2 (two) times daily as needed (Nausea or vomiting).  . polyethylene glycol (MIRALAX / GLYCOLAX) packet Take 17 g by mouth daily as needed for moderate constipation.  . Probiotic Product (ALIGN) 4 MG CAPS Take 4 mg by mouth daily.   . prochlorperazine (COMPAZINE) 10 MG tablet Take 1 tablet (10 mg total) by mouth every 6 (six) hours as needed (Nausea or vomiting).  . risperiDONE (RISPERDAL) 0.25 MG tablet Take 0.25 mg by mouth 2  (two) times daily.  Marland Kitchen senna-docusate (SENOKOT-S) 8.6-50 MG tablet Take 2 tablets by mouth at bedtime.  . Wheat Dextrin (BENEFIBER PO) Take 1 each by mouth 2 (two) times a day.   . [DISCONTINUED] mirabegron ER (MYRBETRIQ) 50 MG TB24 tablet Take 50 mg by mouth daily.   No facility-administered encounter medications on file as of 01/04/2019.     Review of Systems  Constitutional: Positive for fatigue. Negative for activity change, appetite change, chills, diaphoresis, fever and unexpected weight change.  HENT: Negative for congestion.   Respiratory: Negative for cough, shortness of breath and wheezing.   Cardiovascular: Negative for chest pain, palpitations and leg swelling.  Gastrointestinal: Negative for abdominal distention, abdominal pain, constipation and diarrhea.  Genitourinary: Positive for frequency. Negative for difficulty urinating, dysuria, enuresis, flank pain, genital sores, hematuria, menstrual problem, pelvic pain, urgency, vaginal bleeding, vaginal discharge and vaginal pain.  Musculoskeletal: Positive for back pain (chronic low ) and gait problem. Negative for arthralgias, joint swelling and myalgias.  Neurological: Negative for dizziness, tremors, seizures, syncope, facial asymmetry, speech difficulty, weakness, light-headedness, numbness and headaches.  Psychiatric/Behavioral: Positive for confusion. Negative for agitation and behavioral problems.    Immunization History  Administered Date(s) Administered  . Influenza, High Dose Seasonal PF 06/19/2018  . Influenza-Unspecified 06/27/2016  . Pneumococcal Conjugate-13 07/21/2013, 04/15/2016  . Pneumococcal Polysaccharide-23 03/14/2010  . Td 03/09/2010  . Tdap 12/19/2016  . Zoster 08/02/2005, 03/14/2010  . Zoster Recombinat (Shingrix) 06/18/2017, 10/22/2017   Pertinent  Health Maintenance Due  Topic Date Due  . OPHTHALMOLOGY EXAM  02/12/1951  . HEMOGLOBIN A1C  05/14/2018  . URINE MICROALBUMIN  11/12/2018  . INFLUENZA  VACCINE  04/03/2019  . FOOT EXAM  12/21/2019  . DEXA SCAN  Completed  . PNA vac Low Risk Adult  Completed   Fall Risk  10/02/2018  Falls in the past year? 0  Number falls in past yr: 0  Injury with Fall? 0  Follow up Falls evaluation completed;Education provided;Falls prevention discussed   Functional Status Survey:    Vitals:   01/04/19 1356  BP: 127/76  Pulse: 80  Resp: 18  Temp: 97.7 F (36.5 C)  SpO2: 97%  Weight: 157 lb 6.4 oz (71.4 kg)   Body mass index is 26.19 kg/m. Physical Exam Vitals signs and nursing note reviewed.  Constitutional:      General: She is not in acute distress.    Appearance: She is not diaphoretic.  HENT:     Head: Normocephalic and atraumatic.  Neck:     Vascular:  No JVD.  Cardiovascular:     Rate and Rhythm: Normal rate and regular rhythm.     Heart sounds: No murmur.  Pulmonary:     Effort: Pulmonary effort is normal. No respiratory distress.     Breath sounds: Normal breath sounds. No wheezing.  Abdominal:     General: Abdomen is flat. Bowel sounds are normal. There is no distension.     Palpations: Abdomen is soft.     Tenderness: There is no abdominal tenderness. There is no right CVA tenderness or left CVA tenderness.  Musculoskeletal:     Right lower leg: No edema.     Left lower leg: No edema.  Skin:    General: Skin is warm and dry.     Coloration: Skin is pale.  Neurological:     General: No focal deficit present.     Mental Status: She is alert. Mental status is at baseline.     Comments: Oriented x 2  Psychiatric:        Mood and Affect: Mood normal.     Labs reviewed: Recent Labs    09/28/18 1423 10/05/18 1350 10/12/18 1423 01/04/19  NA 140 141 142 137  K 3.7 3.8 3.5 3.8  CL 112* 111 111  --   CO2 21* 23 24  --   GLUCOSE 147* 86 77  --   BUN 17 12 12 9   CREATININE 0.97 0.83 0.87 0.8  CALCIUM 9.0 9.2 9.0  --    Recent Labs    09/28/18 1423 10/05/18 1350 10/12/18 1423  AST 10* 14* 16  ALT 10 12 16    ALKPHOS 46 49 44  BILITOT 0.5 0.5 0.3  PROT 6.3* 6.2* 6.1*  ALBUMIN 3.5 3.6 3.3*   Recent Labs    09/28/18 1423 10/05/18 1350 10/12/18 1423 01/04/19  WBC 19.2* 13.1* 8.0 7.9  NEUTROABS 4.4 3.1 3.2  --   HGB 13.6 13.4 12.6 35.7*  HCT 41.2 40.1 38.0 36  MCV 100.7* 100.5* 102.4*  --   PLT 175 148* 125* 164   Lab Results  Component Value Date   TSH 1.78 11/11/2017   Lab Results  Component Value Date   HGBA1C 5.6 11/11/2017   Lab Results  Component Value Date   CHOL 146 11/11/2017   HDL 42 11/11/2017   LDLCALC 63 11/11/2017   TRIG 207 (A) 11/11/2017    Significant Diagnostic Results in last 30 days:  No results found.  Assessment/Plan  1) Urinary frequency ? If this is due to a hx of radiation and atrophic vaginitis, or possible OAB. Both UAs sent were while on or within 48 hrs of taking trimethoprim. Symptoms occur mostly at night. Would consider another agent for OAB if final urine returns neg. Would not restart trimethoprim in the future to avoid any further confusion. I have asked the staff to monitor and report the number of trips to the BR she takes so we can accurate information since she is a poor historian. She has not had any further low grade temps but will continue to monitor. Screening for Covid pending. Continue isolation until two negative tests.     Family/ staff Communication: staff/resident  Labs/tests ordered:  BMP CBC Covid swab

## 2019-01-11 ENCOUNTER — Other Ambulatory Visit: Payer: Self-pay | Admitting: Adult Health

## 2019-01-11 MED ORDER — LORAZEPAM 0.5 MG PO TABS: 0.5000 mg | ORAL_TABLET | Freq: Two times a day (BID) | ORAL | 1 refills | Status: DC

## 2019-01-11 NOTE — Progress Notes (Signed)
Ativan decreased due to reports of improved mood and lethargy associated with administration.

## 2019-01-14 ENCOUNTER — Non-Acute Institutional Stay (SKILLED_NURSING_FACILITY): Payer: Medicare Other | Admitting: Adult Health

## 2019-01-14 ENCOUNTER — Encounter: Payer: Self-pay | Admitting: Adult Health

## 2019-01-14 DIAGNOSIS — C9110 Chronic lymphocytic leukemia of B-cell type not having achieved remission: Secondary | ICD-10-CM | POA: Diagnosis not present

## 2019-01-14 DIAGNOSIS — F339 Major depressive disorder, recurrent, unspecified: Secondary | ICD-10-CM | POA: Diagnosis not present

## 2019-01-14 DIAGNOSIS — H543 Unqualified visual loss, both eyes: Secondary | ICD-10-CM

## 2019-01-14 DIAGNOSIS — C210 Malignant neoplasm of anus, unspecified: Secondary | ICD-10-CM

## 2019-01-14 DIAGNOSIS — I480 Paroxysmal atrial fibrillation: Secondary | ICD-10-CM

## 2019-01-14 DIAGNOSIS — H9113 Presbycusis, bilateral: Secondary | ICD-10-CM

## 2019-01-14 DIAGNOSIS — F0281 Dementia in other diseases classified elsewhere with behavioral disturbance: Secondary | ICD-10-CM

## 2019-01-14 DIAGNOSIS — G301 Alzheimer's disease with late onset: Secondary | ICD-10-CM | POA: Diagnosis not present

## 2019-01-14 DIAGNOSIS — M48062 Spinal stenosis, lumbar region with neurogenic claudication: Secondary | ICD-10-CM

## 2019-01-14 DIAGNOSIS — I10 Essential (primary) hypertension: Secondary | ICD-10-CM

## 2019-01-14 DIAGNOSIS — N3281 Overactive bladder: Secondary | ICD-10-CM | POA: Insufficient documentation

## 2019-01-14 NOTE — Progress Notes (Signed)
Location:  Occupational psychologist of Service:  SNF (31)  Provider:  Cindi Carbon, ANP [place] [phone]   PCP: Gayland Curry, DO Patient Care Team: Gayland Curry, DO as PCP - General (Geriatric Medicine) Sherren Mocha, MD as PCP - Cardiology (Cardiology) Crist Infante, MD (Internal Medicine) Newton Pigg, MD as Consulting Physician (Obstetrics and Gynecology) Truitt Merle, MD as Consulting Physician (Hematology) Leta Baptist, MD as Consulting Physician (Otolaryngology) Sherlynn Stalls, MD as Consulting Physician (Ophthalmology) Paulla Dolly Tamala Fothergill, DPM as Consulting Physician (Podiatry) Harriett Sine, MD as Consulting Physician (Dermatology) Erroll Luna, MD as Consulting Physician (General Surgery)  Extended Emergency Contact Information Primary Emergency Contact: [place], [place] Phone: [phone] Work Phone: [phone] Relation: None Secondary Emergency Contact: Dorris Singh Mobile Phone: [phone] Relation: Other  Code Status: DNR Goals of care:  Advanced Directive information Advanced Directives 01/14/2019  Does Patient Have a Medical Advance Directive? Yes  Type of Paramedic of [place];Living will;Out of facility DNR (pink MOST or yellow form)  Does patient want to make changes to medical advance directive? -  Copy of [place] in Chart? Yes - validated most recent copy scanned in chart (See row information)  Would patient like information on creating a medical advance directive? -  Pre-existing out of facility DNR order (yellow form or pink MOST form) Yellow form placed in chart (order not valid for inpatient use);Pink MOST form placed in chart (order not valid for inpatient use)     Allergies  Allergen Reactions  . Ambien [Zolpidem Tartrate] Other (See Comments)    UNSPECIFIED REACTION   . Fioricet-Codeine [Butalbital-Apap-Caff-Cod] Other (See Comments)   UNSPECIFIED REACTION   . Macrobid [Nitrofurantoin] Other (See Comments)    UNSPECIFIED REACTION   . Codeine Other (See Comments)    About 50 years ago, took Codeine for scratched cornea while pregnant, and it made her extremely sedated.  . Hydrocodone-Acetaminophen Nausea And Vomiting  . Keflex [Cephalexin] Rash  . Percocet [Oxycodone-Acetaminophen] Nausea And Vomiting    Chief Complaint  Patient presents with  . Discharge Note    HPI:  78 y.o. female  Seen for discharge from [facility] skilled care to assisted living. 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  On 12/11 a pet scan was obtained which showed a hypermetabolic inguinal lymph node which returned positive by bx.  She was treated with chemo and radiation in January. She decided to stop all treatment in Feb due to intolerance to treatment. She is now followed by hospice. She has dementia and requires assistance with ADLs and will have 24 hr care when she moves. She feels great about the move and her spirits have been lifted recently. She has a significant mental health hx with psychosis, depression, anxiety, and bereavement associated with the death of her husband but she is feeling better and more hopeful at this time. She was having issues with lethargy and her ativan was decreased last week which seems to have helped. She is walking with PT and making gains. Bowels and appetite are good.  She has chronic urinary frequency that did not respond to myrbetriq. She gets up 4 times per night but goes to bed for 11 hours. She denies any pain with urination. She reports that she has "been this way since she was a child" and was evaluated for diabetes as a child due to urinary frequency.  Past Medical History:  Diagnosis Date  . Anxiety   . Arthritis   . Cerebral aneurysm    s/p endovascular colling 2008  . CLL (chronic lymphocytic leukemia) (HCC)    CLL  .  Depression   . Hearing loss   . Macular degeneration   . Neuropathy   . Paroxysmal atrial fibrillation (HCC)   . Pneumonia    08-04-17  . PONV (postoperative nausea and vomiting)   . Thyroid disease     Past Surgical History:  Procedure Laterality Date  . ANEURYSM COILING    . CATARACT EXTRACTION     bilateral  . CERVICAL CONE BIOPSY    . COLONOSCOPY    . EVALUATION UNDER ANESTHESIA WITH HEMORRHOIDECTOMY N/A 09/03/2017   Procedure: EXAM UNDER ANESTHESIA, EXCISION OF ANAL LESION, POSSIBLE HEMORRHOIDECTOMY;  Surgeon: Ileana Roup, MD;  Location: [facility];  Service: General;  Laterality: N/A;  . LESION REMOVAL N/A 07/22/2018   Procedure: EXCISION OF PERIANAL LESION;  Surgeon: Ileana Roup, MD;  Location: [facility];  Service: General;  Laterality: N/A;  . LUMBAR LAMINECTOMY/DECOMPRESSION MICRODISCECTOMY Right 04/07/2018   Procedure: Right Lumbar Four-Five Lumbar Five-Sacral One Laminectomy/Microdiscectomy;  Surgeon: Kristeen Miss, MD;  Location: [facility];  Service: Neurosurgery;  Laterality: Right;  Right Lumbar Four-Five Lumbar Five-Sacral One Laminectomy/Microdiscectomy  . RECTAL EXAM UNDER ANESTHESIA N/A 07/22/2018   Procedure: ANORECTAL EXAM UNDER ANESTHESIA ERAS PATHWAY;  Surgeon: Ileana Roup, MD;  Location: [facility];  Service: General;  Laterality: N/A;  . THYROIDECTOMY    . TONSILLECTOMY        reports that she quit smoking about 30 years ago. She has never used smokeless tobacco. She reports that she does not drink alcohol or use drugs. Social History   Socioeconomic History  . Marital status: Widowed    Spouse name: Not on file  . Number of children: 2  . Years of education: 16  . Highest education level: Bachelor's degree (e.g., BA, AB, BS)  Occupational History    Employer: RETIRED  Social Needs  . Financial resource strain: Not on file  . Food insecurity:    Worry: Not on file    Inability: Not on file  . Transportation needs:    Medical: No     Non-medical: No  Tobacco Use  . Smoking status: Former Smoker    Last attempt to quit: 09/02/1988    Years since quitting: 30.3  . Smokeless tobacco: Never Used  Substance and Sexual Activity  . Alcohol use: No    Alcohol/week: 1.0 - 2.0 standard drinks    Types: 1 - 2 Glasses of wine per week    Comment: daily for 50 years  No longer drinks 09-01-17  . Drug use: No  . Sexual activity: Not Currently  Lifestyle  . Physical activity:    Days per week: Not on file    Minutes per session: Not on file  . Stress: Not on file  Relationships  . Social connections:    Talks on phone: Not on file    Gets together: Not on file    Attends religious service: Not on file    Active member of club or organization: Not on file    Attends meetings of clubs or organizations: Not on file    Relationship status: Not on file  . Intimate partner violence:    Fear of current or ex partner: Not on file    Emotionally abused: Not on file  Physically abused: Not on file    Forced sexual activity: Not on file  Other Topics Concern  . Not on file  Social History Narrative   Lives at [facility].  Retired Agricultural engineer.  Education: college.  2 children.   Functional Status Survey:    Allergies  Allergen Reactions  . Ambien [Zolpidem Tartrate] Other (See Comments)    UNSPECIFIED REACTION   . Fioricet-Codeine [Butalbital-Apap-Caff-Cod] Other (See Comments)    UNSPECIFIED REACTION   . Macrobid [Nitrofurantoin] Other (See Comments)    UNSPECIFIED REACTION   . Codeine Other (See Comments)    About 50 years ago, took Codeine for scratched cornea while pregnant, and it made her extremely sedated.  . Hydrocodone-Acetaminophen Nausea And Vomiting  . Keflex [Cephalexin] Rash  . Percocet [Oxycodone-Acetaminophen] Nausea And Vomiting    Pertinent  Health Maintenance Due  Topic Date Due  . OPHTHALMOLOGY EXAM  02/12/1951  . HEMOGLOBIN A1C  05/14/2018  . URINE MICROALBUMIN  02/14/2019 (Originally 11/12/2018)   . INFLUENZA VACCINE  04/03/2019  . FOOT EXAM  12/21/2019  . DEXA SCAN  Completed  . PNA vac Low Risk Adult  Completed    Medications: Outpatient Encounter Medications as of 01/14/2019  Medication Sig  . acetaminophen (TYLENOL) 500 MG tablet Take 500 mg by mouth every 8 (eight) hours.   Marland Kitchen buPROPion (WELLBUTRIN XL) 150 MG 24 hr tablet Take 150 mg by mouth daily. Take with the 300mg  tablet  . buPROPion (WELLBUTRIN XL) 300 MG 24 hr tablet Take 450 mg by mouth daily. Take with the 150mg  tablet  . COMBIGAN 0.2-0.5 % ophthalmic solution Place 1 drop into the left eye 2 (two) times daily.   Marland Kitchen diltiazem (CARDIZEM CD) 180 MG 24 hr capsule Take 180 mg by mouth daily.  Marland Kitchen donepezil (ARICEPT) 5 MG tablet Take 5 mg by mouth at bedtime.  . dorzolamide (TRUSOPT) 2 % ophthalmic solution   . estrogen, conjugated,-medroxyprogesterone (PREMPRO) 0.625-2.5 MG tablet Take 1 tablet by mouth daily.  Marland Kitchen gabapentin (NEURONTIN) 100 MG capsule Take 100 mg by mouth 2 (two) times daily.  . hydrocortisone (ANUSOL-HC) 25 MG suppository Place 25 mg rectally 2 (two) times daily as needed for hemorrhoids or anal itching.  Marland Kitchen HYDROmorphone (DILAUDID) 2 MG tablet Take 0.5 tablets (1 mg total) by mouth every 12 (twelve) hours as needed for severe pain.  Marland Kitchen ibuprofen (ADVIL,MOTRIN) 400 MG tablet Take 1 tablet (400 mg total) by mouth every 8 (eight) hours as needed for moderate pain.  Marland Kitchen lamoTRIgine (LAMICTAL) 100 MG tablet   . latanoprost (XALATAN) 0.005 % ophthalmic solution Place 1 drop into the left eye at bedtime.   . Lidocaine (ASPERCREME LIDOCAINE) 4 % PTCH Apply 1 patch topically daily.   Marland Kitchen lidocaine (XYLOCAINE) 2 % jelly Apply 1 application topically every 8 (eight) hours as needed.  Marland Kitchen LORazepam (ATIVAN) 0.5 MG tablet Take 1 tablet (0.5 mg total) by mouth 2 (two) times daily.  . meclizine (ANTIVERT) 25 MG tablet Take 25 mg by mouth 2 (two) times daily as needed for dizziness.  . Melatonin 5 MG TABS Take 5 mg by mouth at  bedtime.  . memantine (NAMENDA) 5 MG tablet Take 5 mg by mouth daily.  . ondansetron (ZOFRAN) 8 MG tablet Take 1 tablet (8 mg total) by mouth 2 (two) times daily as needed (Nausea or vomiting).  . polyethylene glycol (MIRALAX / GLYCOLAX) packet Take 17 g by mouth daily as needed for moderate constipation.  . Probiotic Product (ALIGN) 4 MG  CAPS Take 4 mg by mouth daily.   . prochlorperazine (COMPAZINE) 10 MG tablet Take 1 tablet (10 mg total) by mouth every 6 (six) hours as needed (Nausea or vomiting).  . risperiDONE (RISPERDAL) 0.25 MG tablet Take 0.25 mg by mouth 2 (two) times daily.  Marland Kitchen senna-docusate (SENOKOT-S) 8.6-50 MG tablet Take 2 tablets by mouth at bedtime.  . Wheat Dextrin (BENEFIBER PO) Take 1 each by mouth 2 (two) times a day.    No facility-administered encounter medications on file as of 01/14/2019.     Review of Systems  Constitutional: Negative for activity change, appetite change, chills, diaphoresis, fatigue, fever and unexpected weight change.  HENT: Negative for congestion.   Respiratory: Negative for cough, shortness of breath and wheezing.   Cardiovascular: Positive for leg swelling. Negative for chest pain and palpitations.  Gastrointestinal: Negative for abdominal distention, abdominal pain, constipation and diarrhea.  Genitourinary: Positive for frequency and urgency. Negative for difficulty urinating, dysuria, flank pain, vaginal bleeding and vaginal discharge.  Musculoskeletal: Positive for back pain and gait problem. Negative for arthralgias, joint swelling and myalgias.  Neurological: Negative for dizziness, tremors, seizures, syncope, facial asymmetry, speech difficulty, weakness, light-headedness, numbness and headaches.  Psychiatric/Behavioral: Positive for confusion. Negative for agitation and behavioral problems.    Vitals:   01/14/19 1142  Weight: 157 lb 6.4 oz (71.4 kg)   Body mass index is 26.19 kg/m.  Wt Readings from Last 3 Encounters:  01/14/19  157 lb 6.4 oz (71.4 kg)  01/04/19 157 lb 6.4 oz (71.4 kg)  12/29/18 152 lb (68.9 kg)   Physical Exam Vitals signs and nursing note reviewed.  Constitutional:      General: She is not in acute distress.    Appearance: She is not diaphoretic.  HENT:     Head: Normocephalic and atraumatic.  Neck:     Vascular: No JVD.  Cardiovascular:     Rate and Rhythm: Normal rate and regular rhythm.     Heart sounds: No murmur.  Pulmonary:     Effort: Pulmonary effort is normal. No respiratory distress.     Breath sounds: Normal breath sounds. No wheezing.  Abdominal:     General: Abdomen is flat. Bowel sounds are normal. There is no distension.     Tenderness: There is no abdominal tenderness.  Musculoskeletal:     Right lower leg: No edema.     Left lower leg: No edema.  Skin:    General: Skin is warm and dry.  Neurological:     General: No focal deficit present.     Mental Status: She is alert. Mental status is at baseline.  Psychiatric:        Mood and Affect: Mood normal.     Labs reviewed: Basic Metabolic Panel: Recent Labs    09/28/18 1423 10/05/18 1350 10/12/18 1423 01/04/19  NA 140 141 142 137  K 3.7 3.8 3.5 3.8  CL 112* 111 111  --   CO2 21* 23 24  --   GLUCOSE 147* 86 77  --   BUN 17 12 12 9   CREATININE 0.97 0.83 0.87 0.8  CALCIUM 9.0 9.2 9.0  --    Liver Function Tests: Recent Labs    09/28/18 1423 10/05/18 1350 10/12/18 1423  AST 10* 14* 16  ALT 10 12 16   ALKPHOS 46 49 44  BILITOT 0.5 0.5 0.3  PROT 6.3* 6.2* 6.1*  ALBUMIN 3.5 3.6 3.3*   No results for input(s): LIPASE, AMYLASE in the last  8760 hours. No results for input(s): AMMONIA in the last 8760 hours. CBC: Recent Labs    09/28/18 1423 10/05/18 1350 10/12/18 1423 01/04/19  WBC 19.2* 13.1* 8.0 7.9  NEUTROABS 4.4 3.1 3.2  --   HGB 13.6 13.4 12.6 35.7*  HCT 41.2 40.1 38.0 36  MCV 100.7* 100.5* 102.4*  --   PLT 175 148* 125* 164   Cardiac Enzymes: No results for input(s): CKTOTAL, CKMB,  CKMBINDEX, TROPONINI in the last 8760 hours. BNP: Invalid input(s): POCBNP CBG: Recent Labs    08/12/18 0826  GLUCAP 94    Procedures and Imaging Studies During Stay: No results found.  Assessment/Plan:   1. Late onset Alzheimer's disease with behavioral disturbance ([place]) MMSE 10/21/18 16/30 (has low vision and decreased hearing) Not on adequate dosing of aricept or namenda but given her hospice status not sure if any additional dose changes would be helpful. She has a hx of psychosis but this was not noted recently.  Continue current regimen.  2. Anal cancer ([place]) No new issues or concerns No longer pursuing treatment , followed by hospice  3. CLL (chronic lymphocytic leukemia) (HCC) Recently her WBC and Hgb have been wnl, continue to monitor   4. Depression, recurrent ([place]) Improved mood recently with her decision to move to assisted living with 24 hr care givers. Would not change current regimen to avoid de stabilization   5. Spinal stenosis of lumbar region with neurogenic claudication S/p decompression. Continue scheduled tylenol and salonpas patch  Continue prn dilaudid but use sparingly  6. Low vision, both eyes Noted   7. Presbycusis of both ears Noted  Both of these sensory issues exacerbate her dementia at times and contribute to her need for 24 hr care  8. PAF (paroxysmal atrial fibrillation) (HCC) Regular on exam today Rate controlled with cardizem, not on anticoagulation due to prior history of gait instability, goals of care and cerebral aneurysm  9. HYPERTENSION, BENIGN Controlled  10. OAB (overactive bladder) Continues to be an issue. Will try changing her evening med times to 6pm to avoid drinking excess water before bed. Did not improve with myrbetriq  May discharge to [place] with 24 hr caregivers.   Discharge review, assessment, plan, and coordination took >30 min  Patient is being discharged with the following home health services:   Continues to  work with PT  Patient is being discharged with the following durable medical equipment:   NA   Future labs/tests needed:  F/u with Dr. Mariea Clonts in one month

## 2019-02-03 ENCOUNTER — Non-Acute Institutional Stay: Payer: Medicare Other | Admitting: Internal Medicine

## 2019-02-03 ENCOUNTER — Encounter: Payer: Self-pay | Admitting: Internal Medicine

## 2019-02-03 ENCOUNTER — Other Ambulatory Visit: Payer: Self-pay

## 2019-02-03 VITALS — BP 108/60 | HR 78 | Temp 97.8°F | Ht 65.0 in | Wt 154.0 lb

## 2019-02-03 DIAGNOSIS — F0281 Dementia in other diseases classified elsewhere with behavioral disturbance: Secondary | ICD-10-CM

## 2019-02-03 DIAGNOSIS — H9113 Presbycusis, bilateral: Secondary | ICD-10-CM

## 2019-02-03 DIAGNOSIS — G301 Alzheimer's disease with late onset: Secondary | ICD-10-CM

## 2019-02-03 DIAGNOSIS — N3281 Overactive bladder: Secondary | ICD-10-CM

## 2019-02-03 DIAGNOSIS — C210 Malignant neoplasm of anus, unspecified: Secondary | ICD-10-CM

## 2019-02-03 DIAGNOSIS — C9110 Chronic lymphocytic leukemia of B-cell type not having achieved remission: Secondary | ICD-10-CM

## 2019-02-03 DIAGNOSIS — H543 Unqualified visual loss, both eyes: Secondary | ICD-10-CM

## 2019-02-03 DIAGNOSIS — E1149 Type 2 diabetes mellitus with other diabetic neurological complication: Secondary | ICD-10-CM

## 2019-02-03 DIAGNOSIS — F3340 Major depressive disorder, recurrent, in remission, unspecified: Secondary | ICD-10-CM

## 2019-02-03 DIAGNOSIS — F02818 Dementia in other diseases classified elsewhere, unspecified severity, with other behavioral disturbance: Secondary | ICD-10-CM

## 2019-02-03 DIAGNOSIS — M48062 Spinal stenosis, lumbar region with neurogenic claudication: Secondary | ICD-10-CM

## 2019-02-03 DIAGNOSIS — I480 Paroxysmal atrial fibrillation: Secondary | ICD-10-CM

## 2019-02-03 NOTE — Progress Notes (Signed)
Provider:  Rexene Edison. Mariea Clonts, D.O., C.M.D. Location:  Occupational psychologist of Service:  Clinic (12)  Previous PCP: Gayland Curry, DO Patient Care Team: Gayland Curry, DO as PCP - General (Geriatric Medicine) Sherren Mocha, MD as PCP - Cardiology (Cardiology) Crist Infante, MD (Internal Medicine) Newton Pigg, MD as Consulting Physician (Obstetrics and Gynecology) Truitt Merle, MD as Consulting Physician (Hematology) Leta Baptist, MD as Consulting Physician (Otolaryngology) Sherlynn Stalls, MD as Consulting Physician (Ophthalmology) Regal, Tamala Fothergill, DPM as Consulting Physician (Podiatry) Harriett Sine, MD as Consulting Physician (Dermatology) Erroll Luna, MD as Consulting Physician (General Surgery)  Extended Emergency Contact Information Primary Emergency Contact: Hannah, [place] Phone: [phone] Work Phone: [phone] Relation: None Secondary Emergency Contact: Dorris Singh Mobile Phone: [phone] Relation: Other  Code Status: DNR, hospice Goals of Care: Advanced Directive information Advanced Directives 02/03/2019  Does Patient Have a Medical Advance Directive? Yes  Type of Paramedic of [place];Living will;Out of facility DNR (pink MOST or yellow form)  Does patient want to make changes to medical advance directive? No - Patient declined  Copy of [place] in Chart? Yes - validated most recent copy scanned in chart (See row information)  Would patient like information on creating a medical advance directive? -  Pre-existing out of facility DNR order (yellow form or pink MOST form) Yellow form placed in chart (order not valid for inpatient use);Pink MOST form placed in chart (order not valid for inpatient use)   Chief Complaint  Patient presents with  . Follow-up    [place]    HPI: Patient is a 78 y.o. female seen today to establish with [facility].  Technically,  we've been seeing her for a few years off and on, but she is officially transferring care now that she is living in [place] and not going back [place].  She is no longer seeing Dr. Joylene Draft.  She has a complex medical history including dementia, depression/bipolar, low vision, anal cancer s/p surgery,  radiation and chemo, lumbar herniated disc s/p surgery and with neuropathy, constipation, hemorrhoids, CLL, glaucoma and macular degeneration.    She is feeling good.  Her husband passed over 2 years ago.  She is still trying to get over that.    Says she does not have any pain here lately.  Does get stiffness of her back.  When she has to stand up straight, she has some lower back discomfort.  She has a follow-up with Dr. Ellene Route coming up.    She is significantly visually impaired and hearing impaired.  She has a huge family history of macular degeneration.  It's advanced now.  She can only see faces with bright light.  Sees things that are not there.  Sees Dr. Baird Cancer for her macular.  He's concerned she is getting glaucoma in her good eye--will be seeing Dr. Bing Plume.  Has drops for the left eye.  Right eye pressure was ok last visit per [name], her caregiver.  Constipation:  Going pretty well.  Gets two stool softeners at the same time at night.  She wants to vomit after taking them.  Feels nauseous.  Says they taste bad/smell bad.  She had two episodes of loose stool last week.  She'd been on benefiber which was stopped.  She was taking a laxative prn before (called clearmax).    Admits her memory is not so good.  She's on aricept and namenda.  She does get a little dizzy.  She thinks it has to do with her eyes.  She did not have vertigo wen checked by the ENT.  She developed diabetes on steroids.    Her caregiver notes that she has a small amount of mucus discharge from her rectum at times, but no particularly bad odor, no bleeding when that happens.  She does get small amounts of blood that they  attribute to hemorrhoids if she has hard bms.  Past Medical History:  Diagnosis Date  . Anxiety   . Arthritis   . Cerebral aneurysm    s/p endovascular colling 2008  . CLL (chronic lymphocytic leukemia) (HCC)    CLL  . Depression   . Hearing loss   . Macular degeneration   . Neuropathy   . Paroxysmal atrial fibrillation (HCC)   . Pneumonia    08-04-17  . PONV (postoperative nausea and vomiting)   . Thyroid disease    Past Surgical History:  Procedure Laterality Date  . ANEURYSM COILING    . CATARACT EXTRACTION     bilateral  . CERVICAL CONE BIOPSY    . COLONOSCOPY    . EVALUATION UNDER ANESTHESIA WITH HEMORRHOIDECTOMY N/A 09/03/2017   Procedure: EXAM UNDER ANESTHESIA, EXCISION OF ANAL LESION, POSSIBLE HEMORRHOIDECTOMY;  Surgeon: Ileana Roup, MD;  Location: [facility];  Service: General;  Laterality: N/A;  . LESION REMOVAL N/A 07/22/2018   Procedure: EXCISION OF PERIANAL LESION;  Surgeon: Ileana Roup, MD;  Location: [facility];  Service: General;  Laterality: N/A;  . LUMBAR LAMINECTOMY/DECOMPRESSION MICRODISCECTOMY Right 04/07/2018   Procedure: Right Lumbar Four-Five Lumbar Five-Sacral One Laminectomy/Microdiscectomy;  Surgeon: Kristeen Miss, MD;  Location: [facility];  Service: Neurosurgery;  Laterality: Right;  Right Lumbar Four-Five Lumbar Five-Sacral One Laminectomy/Microdiscectomy  . RECTAL EXAM UNDER ANESTHESIA N/A 07/22/2018   Procedure: ANORECTAL EXAM UNDER ANESTHESIA ERAS PATHWAY;  Surgeon: Ileana Roup, MD;  Location: [facility];  Service: General;  Laterality: N/A;  . THYROIDECTOMY    . TONSILLECTOMY      Social History   Socioeconomic History  . Marital status: Widowed    Spouse name: Not on file  . Number of children: 2  . Years of education: 16  . Highest education level: Bachelor's degree (e.g., BA, AB, BS)  Occupational History    Employer: RETIRED  Social Needs  . Financial resource strain: Not on file  . Food insecurity:    Worry: Not on file     Inability: Not on file  . Transportation needs:    Medical: No    Non-medical: No  Tobacco Use  . Smoking status: Former Smoker    Last attempt to quit: 09/02/1988    Years since quitting: 30.4  . Smokeless tobacco: Never Used  Substance and Sexual Activity  . Alcohol use: No    Alcohol/week: 1.0 - 2.0 standard drinks    Types: 1 - 2 Glasses of wine per week    Comment: daily for 50 years  No longer drinks 09-01-17  . Drug use: No  . Sexual activity: Not Currently  Lifestyle  . Physical activity:    Days per week: Not on file    Minutes per session: Not on file  . Stress: Not on file  Relationships  . Social connections:    Talks on phone: Not on file    Gets together: Not on file    Attends religious service: Not on file    Active member of club  or organization: Not on file    Attends meetings of clubs or organizations: Not on file    Relationship status: Not on file  Other Topics Concern  . Not on file  Social History Narrative   Lives at [facility].  Retired Agricultural engineer.  Education: college.  2 children.    reports that she quit smoking about 30 years ago. She has never used smokeless tobacco. She reports that she does not drink alcohol or use drugs.  Functional Status Survey:    Family History  Problem Relation Age of Onset  . Cancer Father 29       died  . Cerebral aneurysm Mother 36       died  . Heart attack Brother     Health Maintenance  Topic Date Due  . OPHTHALMOLOGY EXAM  02/12/1951  . HEMOGLOBIN A1C  05/14/2018  . URINE MICROALBUMIN  02/14/2019 (Originally 11/12/2018)  . INFLUENZA VACCINE  04/03/2019  . FOOT EXAM  12/21/2019  . TETANUS/TDAP  12/20/2026  . DEXA SCAN  Completed  . PNA vac Low Risk Adult  Completed    Allergies  Allergen Reactions  . Ambien [Zolpidem Tartrate] Other (See Comments)    UNSPECIFIED REACTION   . Fioricet-Codeine [Butalbital-Apap-Caff-Cod] Other (See Comments)    UNSPECIFIED REACTION   . Macrobid [Nitrofurantoin]  Other (See Comments)    UNSPECIFIED REACTION   . Codeine Other (See Comments)    About 50 years ago, took Codeine for scratched cornea while pregnant, and it made her extremely sedated.  . Hydrocodone-Acetaminophen Nausea And Vomiting  . Keflex [Cephalexin] Rash  . Percocet [Oxycodone-Acetaminophen] Nausea And Vomiting    Outpatient Encounter Medications as of 02/03/2019  Medication Sig  . acetaminophen (TYLENOL) 500 MG tablet Take 500 mg by mouth 2 (two) times a day. 8 am and 2 pm  . buPROPion (WELLBUTRIN XL) 150 MG 24 hr tablet Take 150 mg by mouth daily. Take with the 300mg  tablet  . buPROPion (WELLBUTRIN XL) 300 MG 24 hr tablet Take 450 mg by mouth daily. Take with the 150mg  tablet  . COMBIGAN 0.2-0.5 % ophthalmic solution Place 1 drop into the left eye 2 (two) times daily.   Marland Kitchen diltiazem (CARDIZEM CD) 180 MG 24 hr capsule Take 180 mg by mouth daily.  Marland Kitchen donepezil (ARICEPT) 5 MG tablet Take 5 mg by mouth at bedtime.  . dorzolamide (TRUSOPT) 2 % ophthalmic solution   . estrogen, conjugated,-medroxyprogesterone (PREMPRO) 0.625-2.5 MG tablet Take 1 tablet by mouth daily.  Marland Kitchen gabapentin (NEURONTIN) 100 MG capsule Take 100 mg by mouth 2 (two) times daily.  . hydrocortisone (ANUSOL-HC) 25 MG suppository Place 25 mg rectally 2 (two) times daily as needed for hemorrhoids or anal itching.  Marland Kitchen HYDROmorphone (DILAUDID) 2 MG tablet Take 0.5 tablets (1 mg total) by mouth every 12 (twelve) hours as needed for severe pain.  Marland Kitchen ibuprofen (ADVIL,MOTRIN) 400 MG tablet Take 1 tablet (400 mg total) by mouth every 8 (eight) hours as needed for moderate pain.  Marland Kitchen lamoTRIgine (LAMICTAL) 100 MG tablet   . latanoprost (XALATAN) 0.005 % ophthalmic solution Place 1 drop into the left eye at bedtime.   . Lidocaine (ASPERCREME LIDOCAINE) 4 % PTCH Apply 1 patch topically daily.   Marland Kitchen lidocaine (XYLOCAINE) 2 % jelly Apply 1 application topically every 8 (eight) hours as needed.  . loperamide (IMODIUM A-D) 2 MG tablet Take 4  mg by mouth daily as needed for diarrhea or loose stools.  Marland Kitchen LORazepam (ATIVAN) 0.5 MG tablet  Take 1 tablet (0.5 mg total) by mouth 2 (two) times daily.  . meclizine (ANTIVERT) 25 MG tablet Take 25 mg by mouth 2 (two) times daily as needed for dizziness.  . memantine (NAMENDA) 5 MG tablet Take 5 mg by mouth daily.  . ondansetron (ZOFRAN) 8 MG tablet Take 1 tablet (8 mg total) by mouth 2 (two) times daily as needed (Nausea or vomiting).  . polyethylene glycol (MIRALAX / GLYCOLAX) packet Take 17 g by mouth daily as needed for moderate constipation.  . Probiotic Product (ALIGN) 4 MG CAPS Take 4 mg by mouth daily.   . risperiDONE (RISPERDAL) 0.25 MG tablet Take 0.25 mg by mouth 2 (two) times daily.  Marland Kitchen senna-docusate (SENOKOT-S) 8.6-50 MG tablet Take 2 tablets by mouth at bedtime.  . Wheat Dextrin (BENEFIBER PO) Take 1 each by mouth 2 (two) times a day.    No facility-administered encounter medications on file as of 02/03/2019.     Review of Systems  Constitutional: Positive for malaise/fatigue. Negative for chills and fever.  HENT: Positive for hearing loss.   Eyes: Negative for blurred vision.  Respiratory: Negative for cough and shortness of breath.   Cardiovascular: Negative for chest pain, palpitations and leg swelling.  Gastrointestinal: Positive for constipation. Negative for abdominal pain, blood in stool, diarrhea and melena.       Hemorrhoids  Genitourinary: Positive for frequency and urgency. Negative for dysuria, flank pain and hematuria.  Musculoskeletal: Positive for back pain. Negative for falls.  Skin: Negative for itching and rash.  Neurological: Positive for weakness. Negative for dizziness and loss of consciousness.  Endo/Heme/Allergies: Bruises/bleeds easily.  Psychiatric/Behavioral: Positive for depression and memory loss. The patient is not nervous/anxious and does not have insomnia.        Spirits much better since move to [place]    Vitals:   02/03/19 0941  BP: 108/60    Pulse: 78  Temp: 97.8 F (36.6 C)  TempSrc: Oral  SpO2: 96%  Weight: 154 lb (69.9 kg)  Height: 5\' 5"  (1.651 m)   Body mass index is 25.63 kg/m. Physical Exam Vitals signs reviewed.  Constitutional:      General: She is not in acute distress.    Appearance: Normal appearance. She is not ill-appearing or toxic-appearing.  HENT:     Head: Normocephalic and atraumatic.     Right Ear: External ear normal.     Left Ear: External ear normal.     Ears:     Comments: Hearing aids, still HOH     Nose: Nose normal.     Mouth/Throat:     Pharynx: Oropharynx is clear.  Eyes:     Extraocular Movements: Extraocular movements intact.     Pupils: Pupils are equal, round, and reactive to light.     Comments: Poor vision  Cardiovascular:     Comments: irreg irreg Pulmonary:     Effort: Pulmonary effort is normal.     Breath sounds: Normal breath sounds.  Abdominal:     General: Bowel sounds are normal. There is no distension.     Palpations: Abdomen is soft.     Tenderness: There is no abdominal tenderness. There is no guarding or rebound.  Musculoskeletal: Normal range of motion.  Skin:    General: Skin is warm and dry.     Coloration: Skin is pale.  Neurological:     General: No focal deficit present.     Mental Status: She is alert. Mental status is at baseline.  Motor: Weakness present.     Gait: Gait abnormal.     Comments: Comes in wheelchair  Psychiatric:        Mood and Affect: Mood normal.     Comments: Was slightly tearful when mentioning husband's death     Labs reviewed: Basic Metabolic Panel: Recent Labs    09/28/18 1423 10/05/18 1350 10/12/18 1423 01/04/19  NA 140 141 142 137  K 3.7 3.8 3.5 3.8  CL 112* 111 111  --   CO2 21* 23 24  --   GLUCOSE 147* 86 77  --   BUN 17 12 12 9   CREATININE 0.97 0.83 0.87 0.8  CALCIUM 9.0 9.2 9.0  --    Liver Function Tests: Recent Labs    09/28/18 1423 10/05/18 1350 10/12/18 1423  AST 10* 14* 16  ALT 10 12 16    ALKPHOS 46 49 44  BILITOT 0.5 0.5 0.3  PROT 6.3* 6.2* 6.1*  ALBUMIN 3.5 3.6 3.3*   No results for input(s): LIPASE, AMYLASE in the last 8760 hours. No results for input(s): AMMONIA in the last 8760 hours. CBC: Recent Labs    09/28/18 1423 10/05/18 1350 10/12/18 1423 01/04/19  WBC 19.2* 13.1* 8.0 7.9  NEUTROABS 4.4 3.1 3.2  --   HGB 13.6 13.4 12.6 35.7*  HCT 41.2 40.1 38.0 36  MCV 100.7* 100.5* 102.4*  --   PLT 175 148* 125* 164   Cardiac Enzymes: No results for input(s): CKTOTAL, CKMB, CKMBINDEX, TROPONINI in the last 8760 hours. BNP: Invalid input(s): POCBNP Lab Results  Component Value Date   HGBA1C 5.6 11/11/2017   Lab Results  Component Value Date   TSH 1.78 11/11/2017   Lab Results  Component Value Date   VITAMINB12 285 10/02/2018   Lab Results  Component Value Date   FOLATE >24.0 10/02/2018    Assessment/Plan 1. 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 Matilde Sprang and given medication but it was not helpful for her frequency, what sounds like incomplete bladder emptying and nocturia  2. CLL (chronic lymphocytic leukemia) (HCC) -in remission, monitor  3. Depression, major, recurrent, in remission ([place]) -doing quite well now in [place] with her usual caregiver and overall 24hr care--she says her husband said she would need that and arranged for it before his death  -previously, I'd seen a bipolar diagnosis in her record--she is on lamictal, risperdal, bupropion, and managed by psychiatry   4. Spinal stenosis of lumbar region with neurogenic claudication -had prior herniated disc with surgery and considerable improvement in pain, has persistent neuropathy in feet felt to be related to her back -cont gabapentin therapy  5. Low vision, both eyes -due to macular degeneration and also some glaucoma -see  hpi for details  6. OAB (overactive bladder) -did not benefit from medications and has previously been treated for UTI several times for her frequency; she's had some degree of frequency since she was young, but caregiver does not worsening correlates with her anal ca treatments  7. PAF (paroxysmal atrial fibrillation) (HCC) -rate is controlled with cardizem therapy; she is on hospice care for comfort measures and NOT on anticoagulation  8. Presbycusis of both ears -uses hearing aids and still has some difficulty hearing at times  9. Diabetes mellitus type 2 with neurological manifestations ([place]) -occurred with steroid treatments -recently hba1c normal and  not on meds or insulin Lab Results  Component Value Date   HGBA1C 5.6 11/11/2017   10.  Dementia with behavioral disturbance -difficult in her situation to determine how much was related to her dementia vs her depression and paranoid delusions -is on aricept and namenda therapy and currently does recognize her memory is poor--joked that these are "not working"   Labs/tests ordered:  No new; plan on labs next visit  [place]. Huldah Marin, D.O. [facility] [address], [place] [address] Cell Phone (Mon-Fri 8am-5pm):  [phone] On Call:  [phone] & follow prompts after 5pm & weekends Office Phone:  [phone] Office Fax:  [phone]

## 2019-03-22 ENCOUNTER — Other Ambulatory Visit: Payer: Self-pay | Admitting: Adult Health

## 2019-03-22 MED ORDER — LORAZEPAM 0.5 MG PO TABS: 0.5000 mg | ORAL_TABLET | Freq: Two times a day (BID) | ORAL | 1 refills | Status: DC

## 2019-03-25 LAB — HM DIABETES EYE EXAM

## 2019-04-28 ENCOUNTER — Other Ambulatory Visit: Payer: Self-pay | Admitting: *Deleted

## 2019-04-28 MED ORDER — LORAZEPAM 0.5 MG PO TABS: 0.5000 mg | ORAL_TABLET | Freq: Two times a day (BID) | ORAL | 1 refills | Status: DC

## 2019-05-05 ENCOUNTER — Other Ambulatory Visit: Payer: Self-pay

## 2019-05-05 ENCOUNTER — Encounter: Payer: Self-pay | Admitting: Internal Medicine

## 2019-05-05 ENCOUNTER — Non-Acute Institutional Stay: Payer: Medicare Other | Admitting: Internal Medicine

## 2019-05-05 VITALS — BP 120/70 | HR 72 | Temp 97.7°F | Ht 65.0 in | Wt 154.0 lb

## 2019-05-05 DIAGNOSIS — C210 Malignant neoplasm of anus, unspecified: Secondary | ICD-10-CM

## 2019-05-05 DIAGNOSIS — H543 Unqualified visual loss, both eyes: Secondary | ICD-10-CM | POA: Diagnosis not present

## 2019-05-05 DIAGNOSIS — R350 Frequency of micturition: Secondary | ICD-10-CM

## 2019-05-05 DIAGNOSIS — H00014 Hordeolum externum left upper eyelid: Secondary | ICD-10-CM

## 2019-05-05 DIAGNOSIS — F3340 Major depressive disorder, recurrent, in remission, unspecified: Secondary | ICD-10-CM | POA: Diagnosis not present

## 2019-05-05 NOTE — Progress Notes (Signed)
Location:  [place] of Service:  Clinic (12)  Provider: Arizbeth Cawthorn L. Mariea Clonts, D.O., C.M.D.  Code Status: DNR, hospice care Goals of Care:  Advanced Directives 02/03/2019  Does Patient Have a Medical Advance Directive? Yes  Type of Paramedic of [place];Living will;Out of facility DNR (pink MOST or yellow form)  Does patient want to make changes to medical advance directive? No - Patient declined  Copy of [place] in Chart? Yes - validated most recent copy scanned in chart (See row information)  Would patient like information on creating a medical advance directive? -  Pre-existing out of facility DNR order (yellow form or pink MOST form) Yellow form placed in chart (order not valid for inpatient use);Pink MOST form placed in chart (order not valid for inpatient use)     Chief Complaint  Patient presents with  . Acute Visit    urinary frequency    HPI: Patient is a 78 y.o. female seen today for an acute visit for urinary frequency, also left eye stye, ongoing depression and anxiety.    Her night time frequency has gotten up much worse lately--10 times record last week.  6 times last night.  Then doesn't get much sleep.  Only a little bit will come out sometimes.  Other times a full amount will come out.  Her caregiver, [name], has the record of her times up voiding--it shows 4-6 times.  No other changes.  No dysuria.  She saw a commercial for purewick external catheters.  299 to 399 depending if had battery backup.  There's a 99 supply of wicks.   She's been to [facility].  She's not drinking a lot of fluid after meals.    Past Medical History:  Diagnosis Date  . Anxiety   . Arthritis   . Cerebral aneurysm    s/p endovascular colling 2008  . CLL (chronic lymphocytic leukemia) (HCC)    CLL  . Depression   . Hearing loss   . Macular degeneration   . Neuropathy   . Paroxysmal atrial fibrillation (HCC)   . Pneumonia    08-04-17  . PONV (postoperative nausea and vomiting)   . Thyroid disease     Past Surgical History:  Procedure Laterality Date  . ANEURYSM COILING    . CATARACT EXTRACTION     bilateral  . CERVICAL CONE BIOPSY    . COLONOSCOPY    . EVALUATION UNDER ANESTHESIA WITH HEMORRHOIDECTOMY N/A 09/03/2017   Procedure: EXAM UNDER ANESTHESIA, EXCISION OF ANAL LESION, POSSIBLE HEMORRHOIDECTOMY;  Surgeon: Ileana Roup, MD;  Location: [facility];  Service: General;  Laterality: N/A;  . LESION REMOVAL N/A 07/22/2018   Procedure: EXCISION OF PERIANAL LESION;  Surgeon: Ileana Roup, MD;  Location: [place];  Service: General;  Laterality: N/A;  . LUMBAR LAMINECTOMY/DECOMPRESSION MICRODISCECTOMY Right 04/07/2018   Procedure: Right Lumbar Four-Five Lumbar Five-Sacral One Laminectomy/Microdiscectomy;  Surgeon: Kristeen Miss, MD;  Location: [place];  Service: Neurosurgery;  Laterality: Right;  Right Lumbar Four-Five Lumbar Five-Sacral One Laminectomy/Microdiscectomy  . RECTAL EXAM UNDER ANESTHESIA N/A 07/22/2018   Procedure: ANORECTAL EXAM UNDER ANESTHESIA ERAS PATHWAY;  Surgeon: Ileana Roup, MD;  Location: [place];  Service: General;  Laterality: N/A;  . THYROIDECTOMY    . TONSILLECTOMY      Allergies  Allergen Reactions  . Ambien [Zolpidem Tartrate] Other (See Comments)    UNSPECIFIED REACTION   . Fioricet-Codeine [Butalbital-Apap-Caff-Cod] Other (See Comments)    UNSPECIFIED  REACTION   . Macrobid [Nitrofurantoin] Other (See Comments)    UNSPECIFIED REACTION   . Codeine Other (See Comments)    About 50 years ago, took Codeine for scratched cornea while pregnant, and it made her extremely sedated.  . Hydrocodone-Acetaminophen Nausea And Vomiting  . Keflex [Cephalexin] Rash  . Percocet [Oxycodone-Acetaminophen] Nausea And Vomiting    Outpatient Encounter Medications as of 05/05/2019  Medication Sig  . acetaminophen (TYLENOL) 500 MG tablet Take 500 mg by mouth 2 (two) times a day. 8 am and 2  pm  . buPROPion (WELLBUTRIN XL) 150 MG 24 hr tablet Take 150 mg by mouth daily. Take with the 300mg  tablet  . buPROPion (WELLBUTRIN XL) 300 MG 24 hr tablet Take 450 mg by mouth daily. Take with the 150mg  tablet  . COMBIGAN 0.2-0.5 % ophthalmic solution Place 1 drop into the left eye 2 (two) times daily.   Marland Kitchen diltiazem (CARDIZEM CD) 180 MG 24 hr capsule Take 180 mg by mouth daily.  Marland Kitchen donepezil (ARICEPT) 5 MG tablet Take 5 mg by mouth at bedtime.  . dorzolamide (TRUSOPT) 2 % ophthalmic solution   . estrogen, conjugated,-medroxyprogesterone (PREMPRO) 0.625-2.5 MG tablet Take 1 tablet by mouth daily.  Marland Kitchen gabapentin (NEURONTIN) 100 MG capsule Take 100 mg by mouth 2 (two) times daily.  . hydrocortisone (ANUSOL-HC) 25 MG suppository Place 25 mg rectally 2 (two) times daily as needed for hemorrhoids or anal itching.  Marland Kitchen HYDROmorphone (DILAUDID) 2 MG tablet Take 0.5 tablets (1 mg total) by mouth every 12 (twelve) hours as needed for severe pain.  Marland Kitchen ibuprofen (ADVIL,MOTRIN) 400 MG tablet Take 1 tablet (400 mg total) by mouth every 8 (eight) hours as needed for moderate pain.  Marland Kitchen lamoTRIgine (LAMICTAL) 100 MG tablet   . latanoprost (XALATAN) 0.005 % ophthalmic solution Place 1 drop into the left eye at bedtime.   . Lidocaine (ASPERCREME LIDOCAINE) 4 % PTCH Apply 1 patch topically daily.   Marland Kitchen lidocaine (XYLOCAINE) 2 % jelly Apply 1 application topically every 8 (eight) hours as needed.  . loperamide (IMODIUM A-D) 2 MG tablet Take 4 mg by mouth daily as needed for diarrhea or loose stools.  Marland Kitchen LORazepam (ATIVAN) 0.5 MG tablet Take 1 tablet (0.5 mg total) by mouth 2 (two) times daily.  . meclizine (ANTIVERT) 25 MG tablet Take 25 mg by mouth 2 (two) times daily as needed for dizziness.  . memantine (NAMENDA) 5 MG tablet Take 5 mg by mouth daily.  . ondansetron (ZOFRAN) 8 MG tablet Take 1 tablet (8 mg total) by mouth 2 (two) times daily as needed (Nausea or vomiting).  . polyethylene glycol (MIRALAX / GLYCOLAX)  packet Take 17 g by mouth daily as needed for moderate constipation.  . Probiotic Product (ALIGN) 4 MG CAPS Take 4 mg by mouth daily.   . risperiDONE (RISPERDAL) 0.25 MG tablet Take 0.25 mg by mouth 2 (two) times daily.  . Wheat Dextrin (BENEFIBER PO) Take 1 each by mouth 2 (two) times a day.    No facility-administered encounter medications on file as of 05/05/2019.     Review of Systems:  Review of Systems  Constitutional: Negative for chills, fever and malaise/fatigue.  HENT: Positive for hearing loss.   Eyes: Positive for blurred vision.       Left eye blindness  Respiratory: Negative for cough and shortness of breath.   Cardiovascular: Positive for leg swelling. Negative for chest pain and palpitations.       Refusing compression hose  Gastrointestinal: Negative for abdominal pain, blood in stool, constipation, diarrhea and melena.  Genitourinary: Positive for frequency. Negative for dysuria, hematuria and urgency.  Musculoskeletal: Negative for falls.  Skin: Negative for itching and rash.  Neurological: Negative for dizziness and loss of consciousness.  Endo/Heme/Allergies: Bruises/bleeds easily.  Psychiatric/Behavioral: Positive for depression and memory loss. Negative for suicidal ideas. The patient is nervous/anxious.     Health Maintenance  Topic Date Due  . OPHTHALMOLOGY EXAM  02/12/1951  . HEMOGLOBIN A1C  05/14/2018  . URINE MICROALBUMIN  11/12/2018  . INFLUENZA VACCINE  04/03/2019  . FOOT EXAM  12/21/2019  . TETANUS/TDAP  12/20/2026  . DEXA SCAN  Completed  . PNA vac Low Risk Adult  Completed    Physical Exam: Vitals:   05/05/19 1425  Weight: 154 lb (69.9 kg)  Height: 5\' 5"  (1.651 m)   Body mass index is 25.63 kg/m. Physical Exam Vitals signs reviewed.  Constitutional:      General: She is not in acute distress.    Appearance: Normal appearance. She is normal weight. She is not toxic-appearing.  HENT:     Head: Normocephalic and atraumatic.   Cardiovascular:     Rate and Rhythm: Normal rate and regular rhythm.     Pulses: Normal pulses.     Heart sounds: Normal heart sounds.  Pulmonary:     Effort: Pulmonary effort is normal.     Breath sounds: Normal breath sounds. No rales.  Abdominal:     General: Bowel sounds are normal. There is no distension.     Palpations: Abdomen is soft. There is no mass.     Tenderness: There is no abdominal tenderness. There is no right CVA tenderness, left CVA tenderness, guarding or rebound.  Musculoskeletal: Normal range of motion.     Right lower leg: Edema present.     Left lower leg: Edema present.     Comments: Came in wheelchair with caregiver  Skin:    General: Skin is warm and dry.  Neurological:     General: No focal deficit present.     Mental Status: She is alert and oriented to person, place, and time. Mental status is at baseline.  Psychiatric:        Mood and Affect: Mood normal.     Comments: Tears up with mention of her husband's death     Labs reviewed: Basic Metabolic Panel: Recent Labs    09/28/18 1423 10/05/18 1350 10/12/18 1423 01/04/19  NA 140 141 142 137  K 3.7 3.8 3.5 3.8  CL 112* 111 111  --   CO2 21* 23 24  --   GLUCOSE 147* 86 77  --   BUN 17 12 12 9   CREATININE 0.97 0.83 0.87 0.8  CALCIUM 9.0 9.2 9.0  --    Liver Function Tests: Recent Labs    09/28/18 1423 10/05/18 1350 10/12/18 1423  AST 10* 14* 16  ALT 10 12 16   ALKPHOS 46 49 44  BILITOT 0.5 0.5 0.3  PROT 6.3* 6.2* 6.1*  ALBUMIN 3.5 3.6 3.3*   No results for input(s): LIPASE, AMYLASE in the last 8760 hours. No results for input(s): AMMONIA in the last 8760 hours. CBC: Recent Labs    09/28/18 1423 10/05/18 1350 10/12/18 1423 01/04/19  WBC 19.2* 13.1* 8.0 7.9  NEUTROABS 4.4 3.1 3.2  --   HGB 13.6 13.4 12.6 35.7*  HCT 41.2 40.1 38.0 36  MCV 100.7* 100.5* 102.4*  --   PLT 175 148*  125* 164   Lipid Panel: No results for input(s): CHOL, HDL, LDLCALC, TRIG, CHOLHDL, LDLDIRECT in  the last 8760 hours. Lab Results  Component Value Date   HGBA1C 5.6 11/11/2017    Assessment/Plan 1. Urinary frequency -suspect some urinary retention vs. Signaling or muscular issue  -will get bladder US to evaluate  -if retaining >250cc, start I/O cath nightly and see if she can sleep thru night -if not retaining, may try purewick device  2. Anal cancer ([place]) -s/p XRT which has led to increased urinary issues and dysuria -is on hormones at 52 which must be thru gyn also now which should help with atrophic vaginitis  3. Depression, major, recurrent, in remission ([place]) -cont wellbutrin, increase ativan to tid due to tearful and agitated spells, keep single prn; would not change benzo due to risks of worsening side effects with others and interactions with her other meds  4. 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 Hordeolum externum of left upper eyelid -needs warm compresses 20 mins tid for this until resolved -also may use wipes to clean lids if they have a lot of drainage--one per eye not using same one on both lids causing contamination  Labs/tests ordered: no new Next appt:  06/09/2019  Mohan Erven L. Jakyron Fabro, D.O. [facility] [address], [place] [address] Cell Phone (Mon-Fri 8am-5pm):  [phone] On Call:  [phone] & follow prompts after 5pm & weekends Office Phone:  [phone] Office Fax:  [phone]

## 2019-06-09 ENCOUNTER — Non-Acute Institutional Stay: Payer: Medicare Other | Admitting: Internal Medicine

## 2019-06-09 ENCOUNTER — Encounter: Payer: Self-pay | Admitting: Internal Medicine

## 2019-06-09 ENCOUNTER — Other Ambulatory Visit: Payer: Self-pay

## 2019-06-09 VITALS — BP 126/76 | HR 69 | Temp 98.0°F | Resp 18 | Ht 65.0 in | Wt 154.0 lb

## 2019-06-09 DIAGNOSIS — C210 Malignant neoplasm of anus, unspecified: Secondary | ICD-10-CM

## 2019-06-09 DIAGNOSIS — H543 Unqualified visual loss, both eyes: Secondary | ICD-10-CM

## 2019-06-09 DIAGNOSIS — E1149 Type 2 diabetes mellitus with other diabetic neurological complication: Secondary | ICD-10-CM

## 2019-06-09 DIAGNOSIS — N3281 Overactive bladder: Secondary | ICD-10-CM

## 2019-06-09 DIAGNOSIS — M48062 Spinal stenosis, lumbar region with neurogenic claudication: Secondary | ICD-10-CM

## 2019-06-09 DIAGNOSIS — F3340 Major depressive disorder, recurrent, in remission, unspecified: Secondary | ICD-10-CM | POA: Diagnosis not present

## 2019-06-09 NOTE — Progress Notes (Signed)
Location:  Occupational psychologist of Service:  Clinic (12)  Provider: Any Mcneice L. Mariea Clonts, D.O., C.M.D.  Code Status: DNR, MOST, hospice Goals of Care:  Advanced Directives 06/09/2019  Does Patient Have a Medical Advance Directive? Yes  Type of Paramedic of [place];Out of facility DNR (pink MOST or yellow form);Living will  Does patient want to make changes to medical advance directive? No - Patient declined  Copy of [place] in Chart? Yes - validated most recent copy scanned in chart (See row information)  Would patient like information on creating a medical advance directive? -  Pre-existing out of facility DNR order (yellow form or pink MOST form) Pink MOST/Yellow Form most recent copy in chart - Physician notified to receive inpatient order     Chief Complaint  Patient presents with  . Medical Management of Chronic Issues    4 mo f/u    HPI: Patient is a 78 y.o. female seen today for medical management of chronic diseases.    Seen with caregiver, [name], present today.   Mrs. Hallstrom has been doing quite well.  She was treated for another UTI since we met b/c culture was again obtained.  Her frequency continued and they did decide to go ahead and get the purewick device.  It's been quite helpful at decreasing her need to get up to urinate at night.  There was one incident overnight where it got displaced and there was urine all over the bed.    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    She has not had a recent hba1c or urine microalbumin though my impression had been that her diabetes was secondary to steroid use historically primary etiology.  Her biggest complaint today was that her care plan does not permit her to walk with her walker back and forth to the bathroom.  She does not want to lose all strength in her legs and wishes she'd be permitted to  do that with her caregivers present.  She's concerned about their liability (not sure why b/c she'd have to be the one to accuse them of wrongdoing).  She thinks she's had improvement in her walking over the past few months and wants to be able to walk that short distance accompanied by her caregivers.  Previously, it's been determined by PT that this is not safe (suspect due to cognition affecting judgment at times and visual impairment).  She wants me to discuss this with the therapy dept.  She is receiving PT right now.  Her stye on her left eye has resolved.  Her anxiety has been doing better.  When she took three of the lorazepam over the course of a day, she actually wound up "drunk" per [name] so she could not safely ambulate or function.  So, it's been reduced back to bid.  Her periods of getting anxious and upset seem to occur no more often than that.    Caregiver and pt were confused about her reasons for being isolated with covid, but I was not aware of the caregivers affected and could not share this information with the caregiver if I was.  Past Medical History:  Diagnosis Date  . Anxiety   . Arthritis   . Cerebral aneurysm    s/p endovascular colling 2008  . CLL (chronic lymphocytic leukemia) (HCC)    CLL  . Depression   .  Hearing loss   . Macular degeneration   . Neuropathy   . Paroxysmal atrial fibrillation (HCC)   . Pneumonia    08-04-17  . PONV (postoperative nausea and vomiting)   . Thyroid disease     Past Surgical History:  Procedure Laterality Date  . ANEURYSM COILING    . CATARACT EXTRACTION     bilateral  . CERVICAL CONE BIOPSY    . COLONOSCOPY    . EVALUATION UNDER ANESTHESIA WITH HEMORRHOIDECTOMY N/A 09/03/2017   Procedure: EXAM UNDER ANESTHESIA, EXCISION OF ANAL LESION, POSSIBLE HEMORRHOIDECTOMY;  Surgeon: Ileana Roup, MD;  Location: [facility];  Service: General;  Laterality: N/A;  . LESION REMOVAL N/A 07/22/2018   Procedure: EXCISION OF PERIANAL  LESION;  Surgeon: Ileana Roup, MD;  Location: [facility];  Service: General;  Laterality: N/A;  . LUMBAR LAMINECTOMY/DECOMPRESSION MICRODISCECTOMY Right 04/07/2018   Procedure: Right Lumbar Four-Five Lumbar Five-Sacral One Laminectomy/Microdiscectomy;  Surgeon: Kristeen Miss, MD;  Location: [facility];  Service: Neurosurgery;  Laterality: Right;  Right Lumbar Four-Five Lumbar Five-Sacral One Laminectomy/Microdiscectomy  . RECTAL EXAM UNDER ANESTHESIA N/A 07/22/2018   Procedure: ANORECTAL EXAM UNDER ANESTHESIA ERAS PATHWAY;  Surgeon: Ileana Roup, MD;  Location: [facility];  Service: General;  Laterality: N/A;  . THYROIDECTOMY    . TONSILLECTOMY      Allergies  Allergen Reactions  . Ambien [Zolpidem Tartrate] Other (See Comments)    UNSPECIFIED REACTION   . Fioricet-Codeine [Butalbital-Apap-Caff-Cod] Other (See Comments)    UNSPECIFIED REACTION   . Macrobid [Nitrofurantoin] Other (See Comments)    UNSPECIFIED REACTION   . Codeine Other (See Comments)    About 50 years ago, took Codeine for scratched cornea while pregnant, and it made her extremely sedated.  . Hydrocodone-Acetaminophen Nausea And Vomiting  . Keflex [Cephalexin] Rash  . Percocet [Oxycodone-Acetaminophen] Nausea And Vomiting    Outpatient Encounter Medications as of 06/09/2019  Medication Sig  . acetaminophen (TYLENOL) 500 MG tablet Take 500 mg by mouth 2 (two) times a day. 8 am and 2 pm Patient may take 1 tablet as needed for pain twice daily.  Marland Kitchen buPROPion (WELLBUTRIN XL) 150 MG 24 hr tablet Take 150 mg by mouth daily. Take with the 363m tablet  . buPROPion (WELLBUTRIN XL) 300 MG 24 hr tablet Take 450 mg by mouth daily. Take with the 1529mtablet  . COMBIGAN 0.2-0.5 % ophthalmic solution Place 1 drop into the left eye 2 (two) times daily.   . Marland Kitcheniltiazem (CARDIZEM CD) 180 MG 24 hr capsule Take 180 mg by mouth daily.  . Marland Kitchenonepezil (ARICEPT) 5 MG tablet Take 5 mg by mouth at bedtime.  . dorzolamide (TRUSOPT) 2 % ophthalmic  solution Place 1 drop into the left eye 3 (three) times daily.   . Marland Kitchenstrogen, conjugated,-medroxyprogesterone (PREMPRO) 0.625-2.5 MG tablet Take 1 tablet by mouth daily.  . Marland Kitchenabapentin (NEURONTIN) 100 MG capsule Take 100 mg by mouth 2 (two) times daily.  . hydrocortisone (ANUSOL-HC) 25 MG suppository Place 25 mg rectally 2 (two) times daily as needed for hemorrhoids or anal itching.  . Marland Kitchenbuprofen (ADVIL,MOTRIN) 400 MG tablet Take 1 tablet (400 mg total) by mouth every 8 (eight) hours as needed for moderate pain.  . Marland KitchenamoTRIgine (LAMICTAL) 100 MG tablet Take 100 mg by mouth at bedtime.   . Marland Kitchenatanoprost (XALATAN) 0.005 % ophthalmic solution Place 1 drop into the left eye at bedtime.   . Lidocaine (ASPERCREME LIDOCAINE) 4 % PTCH Apply 1 patch topically daily as needed (for lower  back / right hip pain).   Marland Kitchen lidocaine (XYLOCAINE) 2 % jelly Apply 1 application topically every 8 (eight) hours as needed.  . loperamide (IMODIUM A-D) 2 MG tablet Take 4 mg by mouth daily as needed for diarrhea or loose stools.  Marland Kitchen LORazepam (ATIVAN) 0.5 MG tablet Take 1 tablet (0.5 mg total) by mouth 2 (two) times daily.  . meclizine (ANTIVERT) 25 MG tablet Take 25 mg by mouth 2 (two) times daily as needed for dizziness.  . memantine (NAMENDA) 5 MG tablet Take 5 mg by mouth at bedtime.   . ondansetron (ZOFRAN) 8 MG tablet Take 1 tablet (8 mg total) by mouth 2 (two) times daily as needed (Nausea or vomiting).  . polyethylene glycol (MIRALAX / GLYCOLAX) packet Take 17 g by mouth daily as needed for moderate constipation.  . Probiotic Product (ALIGN) 4 MG CAPS Take 4 mg by mouth daily.   . risperiDONE (RISPERDAL) 0.25 MG tablet Take 0.25 mg by mouth 2 (two) times daily.  Marland Kitchen saccharomyces boulardii (FLORASTOR) 250 MG capsule Take 250 mg by mouth 2 (two) times daily.  . Wheat Dextrin (BENEFIBER PO) Take 15 mLs by mouth 2 (two) times a day. Stir 1 tablespoon in 4-8 oz of non- carbonated beverage until dissolved.  . [DISCONTINUED]  HYDROmorphone (DILAUDID) 2 MG tablet Take 0.5 tablets (1 mg total) by mouth every 12 (twelve) hours as needed for severe pain.   No facility-administered encounter medications on file as of 06/09/2019.     Review of Systems:  Review of Systems  Constitutional: Negative for chills, fever and malaise/fatigue.  Eyes: Positive for blurred vision. Negative for pain.  Respiratory: Negative for cough and shortness of breath.   Cardiovascular: Negative for chest pain, palpitations and leg swelling.  Gastrointestinal: Negative for abdominal pain, blood in stool, melena, nausea and vomiting.  Genitourinary: Positive for frequency. Negative for dysuria and urgency.       Nocturia improved with use of purewick  Musculoskeletal: Positive for back pain.  Skin: Negative for itching and rash.  Neurological: Positive for tingling and sensory change. Negative for dizziness and loss of consciousness.  Endo/Heme/Allergies: Bruises/bleeds easily.  Psychiatric/Behavioral: Positive for depression and memory loss. Negative for hallucinations. The patient is nervous/anxious. The patient does not have insomnia.     Health Maintenance  Topic Date Due  . OPHTHALMOLOGY EXAM  02/12/1951  . HEMOGLOBIN A1C  05/14/2018  . URINE MICROALBUMIN  11/12/2018  . INFLUENZA VACCINE  04/03/2019  . FOOT EXAM  12/21/2019  . TETANUS/TDAP  12/20/2026  . DEXA SCAN  Completed  . PNA vac Low Risk Adult  Completed    Physical Exam: Vitals:   06/09/19 1344  BP: 126/76  Pulse: 69  Resp: 18  Temp: 98 F (36.7 C)  SpO2: 93%  Weight: 154 lb (69.9 kg)  Height: _0  (1.651 m)   Body mass index is 25.63 kg/m. Physical Exam Vitals signs and nursing note reviewed.  Constitutional:      General: She is not in acute distress.    Appearance: Normal appearance. She is not ill-appearing or toxic-appearing.  HENT:     Head: Normocephalic and atraumatic.  Eyes:     Comments: Poor vision so eye contact not good  Cardiovascular:       Rate and Rhythm: Normal rate and regular rhythm.     Heart sounds: No murmur.  Pulmonary:     Effort: Pulmonary effort is normal. No respiratory distress.     Breath sounds:  Normal breath sounds. No wheezing, rhonchi or rales.  Abdominal:     General: Bowel sounds are normal.  Musculoskeletal: Normal range of motion.     Right lower leg: No edema.     Left lower leg: No edema.     Comments: Seated in manual wheelchair  Skin:    General: Skin is warm and dry.     Capillary Refill: Capillary refill takes less than 2 seconds.     Coloration: Skin is pale.     Comments: Skin tone unchanged from baseline  Neurological:     General: No focal deficit present.     Mental Status: She is alert. Mental status is at baseline.     Gait: Gait abnormal.  Psychiatric:        Mood and Affect: Mood normal.     Comments: Only teared up once about her husband's death today     Labs reviewed: Basic Metabolic Panel: Recent Labs    09/28/18 1423 10/05/18 1350 10/12/18 1423 01/04/19  NA 140 141 142 137  K 3.7 3.8 3.5 3.8  CL 112* 111 111  --   CO2 21* 23 24  --   GLUCOSE 147* 86 77  --   BUN _0 CREATININE 0.97 0.83 0.87 0.8  CALCIUM 9.0 9.2 9.0  --    Liver Function Tests: Recent Labs    09/28/18 1423 10/05/18 1350 10/12/18 1423  AST 10* 14* 16  ALT _1 ALKPHOS 46 49 44  BILITOT 0.5 0.5 0.3  PROT 6.3* 6.2* 6.1*  ALBUMIN 3.5 3.6 3.3*   No results for input(s): LIPASE, AMYLASE in the last 8760 hours. No results for input(s): AMMONIA in the last 8760 hours. CBC: Recent Labs    09/28/18 1423 10/05/18 1350 10/12/18 1423 01/04/19  WBC 19.2* 13.1* 8.0 7.9  NEUTROABS 4.4 3.1 3.2  --   HGB 13.6 13.4 12.6 35.7*  HCT 41.2 40.1 38.0 36  MCV 100.7* 100.5* 102.4*  --   PLT 175 148* 125* 164   Lipid Panel: No results for input(s): CHOL, HDL, LDLCALC, TRIG, CHOLHDL, LDLDIRECT in the last 8760 hours. Lab Results  Component Value Date   HGBA1C 5.6 11/11/2017     Assessment/Plan 1. Depression, major, recurrent, in remission ([place]) -seems considerably better this visit; continue lamictal, risperdal, ativan and wellbutrin per psychiatry -has not done well with less wellbutrin and has been requiring bid ativan per caregiver report due to periods of tearfulness and agitation (when thinks about her husband's death)  2. Low vision, both eyes -continues to affect her functional status, cont regimen as per ophtho and caregiver support   3. OAB (overactive bladder) -cont purewick device -in even of NEW symptoms, consult with Dr. Mariea Clonts before obtaining urine samples -pt also with CLL history so need to be sure she's not having constitutional symptoms related to this, also, should she get fever-it may not always be a UTI  4. Anal cancer ([place]) -s/p surgery and XRT, seems to have had urinary side effects -no changes with bowels since last visit reported  5. Diabetes mellitus type 2 with neurological manifestations ([place]) -check hba1c and urine micro to satisfy metrics, but my impression has been that her diabetes was secondary to steroid use   6. Spinal stenosis of lumbar region with neurogenic claudication -stable after surgery with current pain regimen -cont PT for balance and strengthening -will discuss her walking with PT, but I'm certain there are reasons for  her not walking with her walker when not with therapy (likely cognitive and visual) Labs/tests ordered:  hba1c and urine micro next draw Next appt:  4 mos med mgt in [facility]  Reata Petrov L. Daymen Hassebrock, D.O. [facility] [address], [place] [address] Cell Phone (Mon-Fri 8am-5pm):  [phone] On Call:  [phone] & follow prompts after 5pm & weekends Office Phone:  [phone] Office Fax:  [phone]

## 2019-06-10 LAB — MICROALBUMIN, URINE: Microalb, Ur: 4.4

## 2019-06-11 LAB — NOVEL CORONAVIRUS, NAA: SARS-CoV-2, NAA: NOT DETECTED

## 2019-06-11 LAB — HEMOGLOBIN A1C: Hemoglobin A1C: 5.4

## 2019-06-16 ENCOUNTER — Encounter: Payer: Self-pay | Admitting: *Deleted

## 2019-07-02 ENCOUNTER — Encounter: Payer: Self-pay | Admitting: Internal Medicine

## 2019-08-06 ENCOUNTER — Other Ambulatory Visit: Payer: Self-pay | Admitting: Internal Medicine

## 2019-08-06 ENCOUNTER — Other Ambulatory Visit: Payer: Self-pay | Admitting: Adult Health

## 2019-08-06 MED ORDER — LORAZEPAM 0.5 MG PO TABS: 0.5000 mg | ORAL_TABLET | Freq: Two times a day (BID) | ORAL | 2 refills | Status: DC

## 2019-08-06 NOTE — Telephone Encounter (Signed)
Medication approved by NP earlier today

## 2019-10-06 ENCOUNTER — Other Ambulatory Visit: Payer: Self-pay

## 2019-10-06 ENCOUNTER — Encounter: Payer: Self-pay | Admitting: Internal Medicine

## 2019-10-06 ENCOUNTER — Non-Acute Institutional Stay: Payer: Medicare Other | Admitting: Internal Medicine

## 2019-10-06 VITALS — BP 120/76 | HR 95 | Temp 97.7°F | Ht 65.0 in | Wt 157.4 lb

## 2019-10-06 DIAGNOSIS — F3340 Major depressive disorder, recurrent, in remission, unspecified: Secondary | ICD-10-CM

## 2019-10-06 DIAGNOSIS — F419 Anxiety disorder, unspecified: Secondary | ICD-10-CM | POA: Diagnosis not present

## 2019-10-06 DIAGNOSIS — F0151 Vascular dementia with behavioral disturbance: Secondary | ICD-10-CM

## 2019-10-06 DIAGNOSIS — C210 Malignant neoplasm of anus, unspecified: Secondary | ICD-10-CM | POA: Diagnosis not present

## 2019-10-06 DIAGNOSIS — N3281 Overactive bladder: Secondary | ICD-10-CM | POA: Diagnosis not present

## 2019-10-06 DIAGNOSIS — H543 Unqualified visual loss, both eyes: Secondary | ICD-10-CM

## 2019-10-06 DIAGNOSIS — F01518 Vascular dementia, unspecified severity, with other behavioral disturbance: Secondary | ICD-10-CM

## 2019-10-06 MED ORDER — LORAZEPAM 0.5 MG PO TABS: 0.2500 mg | ORAL_TABLET | Freq: Every day | ORAL | 0 refills | Status: DC | PRN

## 2019-10-06 NOTE — Progress Notes (Signed)
Location:   Time of Service:  Clinic (12)  Provider: Nicolina Hirt L. Mariea Clonts, D.O., C.M.D.  Code Status: DNR, MOST, hospice Goals of Care:  Advanced Directives 10/06/2019  Does Patient Have a Medical Advance Directive? Yes  Type of Paramedic of [place];Living will;Out of facility DNR (pink MOST or yellow form)  Does patient want to make changes to medical advance directive? No - Patient declined  Copy of [place] in Chart? Yes - validated most recent copy scanned in chart (See row information)  Would patient like information on creating a medical advance directive? -  Pre-existing out of facility DNR order (yellow form or pink MOST form) Pink MOST/Yellow Form most recent copy in chart - Physician notified to receive inpatient order   Chief Complaint  Patient presents with  . Acute Visit    Anxious and cognitive changes  forgetful with basic needs,   which causes  anxiety    HPI: Patient is a 79 y.o. female seen today for an acute visit for anxiety and cognitive decline.    She is beginning to get more forgetful.  She asks the same things over and over.  [name], her day caregiver, mentions this and they are asking for clarification that she does have dementia.  I explained that it appears she does due to some challenges she has with her memory and her executive functioning.  This seems to be more due to her circulation than Alzheimer's disease.    She has days where she is very upset and hard to redirect in the middle of the day.  She takes bid anxiolytic.  When she was on it tid, she got too drowsy.  Caregiver reports she needs to have something when she gets like this.    We discussed the importance of protein to help her strength and nutrition and to maintain her weight.  They're doing boost smoothies--puts fruit in the boost.  She'll have one meal from [place].    Hemorrhoids not bothersome lately.  No vaginal or bladder  complaints today either.  Encouraged hydration.  Past Medical History:  Diagnosis Date  . Anxiety   . Arthritis   . Cerebral aneurysm    s/p endovascular colling 2008  . CLL (chronic lymphocytic leukemia) (HCC)    CLL  . Depression   . Hearing loss   . Macular degeneration   . Neuropathy   . Paroxysmal atrial fibrillation (HCC)   . Pneumonia    08-04-17  . PONV (postoperative nausea and vomiting)   . Thyroid disease     Past Surgical History:  Procedure Laterality Date  . ANEURYSM COILING    . CATARACT EXTRACTION     bilateral  . CERVICAL CONE BIOPSY    . COLONOSCOPY    . EVALUATION UNDER ANESTHESIA WITH HEMORRHOIDECTOMY N/A 09/03/2017   Procedure: EXAM UNDER ANESTHESIA, EXCISION OF ANAL LESION, POSSIBLE HEMORRHOIDECTOMY;  Surgeon: Ileana Roup, MD;  Location: [facility];  Service: General;  Laterality: N/A;  . LESION REMOVAL N/A 07/22/2018   Procedure: EXCISION OF PERIANAL LESION;  Surgeon: Ileana Roup, MD;  Location: [facility];  Service: General;  Laterality: N/A;  . LUMBAR LAMINECTOMY/DECOMPRESSION MICRODISCECTOMY Right 04/07/2018   Procedure: Right Lumbar Four-Five Lumbar Five-Sacral One Laminectomy/Microdiscectomy;  Surgeon: Kristeen Miss, MD;  Location: [facility];  Service: Neurosurgery;  Laterality: Right;  Right Lumbar Four-Five Lumbar Five-Sacral One Laminectomy/Microdiscectomy  . RECTAL EXAM UNDER ANESTHESIA N/A 07/22/2018   Procedure: ANORECTAL  [place];  Surgeon: Ileana Roup, MD;  Location: [facility];  Service: General;  Laterality: N/A;  . THYROIDECTOMY    . TONSILLECTOMY      Allergies  Allergen Reactions  . Ambien [Zolpidem Tartrate] Other (See Comments)    UNSPECIFIED REACTION   . Fioricet-Codeine [Butalbital-Apap-Caff-Cod] Other (See Comments)    UNSPECIFIED REACTION   . Macrobid [Nitrofurantoin] Other (See Comments)    UNSPECIFIED REACTION   . Codeine Other (See Comments)    About 50 years ago, took Codeine for  scratched cornea while pregnant, and it made her extremely sedated.  . Hydrocodone-Acetaminophen Nausea And Vomiting  . Keflex [Cephalexin] Rash  . Percocet [Oxycodone-Acetaminophen] Nausea And Vomiting    Outpatient Encounter Medications as of 10/06/2019  Medication Sig  . acetaminophen (TYLENOL) 500 MG tablet Take 500 mg by mouth 2 (two) times a day. 8 am and 2 pm Patient may take 1 tablet as needed for pain twice daily.  Marland Kitchen buPROPion (WELLBUTRIN XL) 150 MG 24 hr tablet Take 150 mg by mouth daily. Take with the 300mg  tablet  . buPROPion (WELLBUTRIN XL) 300 MG 24 hr tablet Take 450 mg by mouth daily. Take with the 150mg  tablet  . COMBIGAN 0.2-0.5 % ophthalmic solution Place 1 drop into the left eye 2 (two) times daily.   Marland Kitchen diltiazem (CARDIZEM CD) 180 MG 24 hr capsule Take 180 mg by mouth daily.  Marland Kitchen donepezil (ARICEPT) 5 MG tablet Take 5 mg by mouth at bedtime.  . dorzolamide (TRUSOPT) 2 % ophthalmic solution Place 1 drop into the left eye 3 (three) times daily.   Marland Kitchen estrogen, conjugated,-medroxyprogesterone (PREMPRO) 0.625-2.5 MG tablet Take 1 tablet by mouth daily.  Marland Kitchen gabapentin (NEURONTIN) 100 MG capsule Take 100 mg by mouth 2 (two) times daily.  . hydrocortisone (ANUSOL-HC) 25 MG suppository Place 25 mg rectally 2 (two) times daily as needed for hemorrhoids or anal itching.  Marland Kitchen ibuprofen (ADVIL,MOTRIN) 400 MG tablet Take 1 tablet (400 mg total) by mouth every 8 (eight) hours as needed for moderate pain.  Marland Kitchen lamoTRIgine (LAMICTAL) 100 MG tablet Take 100 mg by mouth at bedtime.   Marland Kitchen latanoprost (XALATAN) 0.005 % ophthalmic solution Place 1 drop into the left eye at bedtime.   . Lidocaine (ASPERCREME LIDOCAINE) 4 % PTCH Apply 1 patch topically daily as needed (for lower back / right hip pain).   Marland Kitchen lidocaine (XYLOCAINE) 2 % jelly Apply 1 application topically every 8 (eight) hours as needed.  . loperamide (IMODIUM A-D) 2 MG tablet Take 4 mg by mouth daily as needed for diarrhea or loose stools.  Marland Kitchen  LORazepam (ATIVAN) 0.5 MG tablet Take 1 tablet (0.5 mg total) by mouth 2 (two) times daily.  . meclizine (ANTIVERT) 25 MG tablet Take 25 mg by mouth 2 (two) times daily as needed for dizziness.  . memantine (NAMENDA) 5 MG tablet Take 5 mg by mouth at bedtime.   . ondansetron (ZOFRAN) 8 MG tablet Take 1 tablet (8 mg total) by mouth 2 (two) times daily as needed (Nausea or vomiting).  . polyethylene glycol (MIRALAX / GLYCOLAX) packet Take 17 g by mouth daily as needed for moderate constipation.  . Probiotic Product (ALIGN) 4 MG CAPS Take 4 mg by mouth daily.   . risperiDONE (RISPERDAL) 0.25 MG tablet Take 0.25 mg by mouth 2 (two) times daily.  . Wheat Dextrin (BENEFIBER PO) Take 15 mLs by mouth 2 (two) times a day. Stir 1 tablespoon in 4-8 oz  of non- carbonated beverage until dissolved.   No facility-administered encounter medications on file as of 10/06/2019.    Review of Systems:  Review of Systems  Constitutional: Positive for malaise/fatigue and weight loss. Negative for chills and fever.  HENT: Positive for hearing loss. Negative for congestion and sore throat.   Eyes: Positive for blurred vision.  Respiratory: Negative for cough and shortness of breath.   Cardiovascular: Negative for chest pain, palpitations and leg swelling.  Gastrointestinal: Negative for abdominal pain, blood in stool, constipation, diarrhea and melena.  Genitourinary: Negative for dysuria.       Urinary incontinence  Musculoskeletal: Negative for back pain and falls.  Skin: Negative for itching and rash.  Neurological: Negative for dizziness and loss of consciousness.  Endo/Heme/Allergies: Bruises/bleeds easily.  Psychiatric/Behavioral: Positive for memory loss. Negative for depression. The patient is nervous/anxious.     Health Maintenance  Topic Date Due  . HEMOGLOBIN A1C  12/10/2019  . FOOT EXAM  12/21/2019  . OPHTHALMOLOGY EXAM  03/24/2020  . URINE MICROALBUMIN  06/09/2020  . TETANUS/TDAP  12/20/2026  .  INFLUENZA VACCINE  Completed  . DEXA SCAN  Completed  . PNA vac Low Risk Adult  Completed    Physical Exam: Vitals:   10/06/19 1141  BP: 120/76  Pulse: 95  Temp: 97.7 F (36.5 C)  TempSrc: Temporal  SpO2: (!) 80%  Weight: 157 lb 6.4 oz (71.4 kg)  Height: 5\' 5"  (1.651 m)   Body mass index is 26.19 kg/m. Physical Exam Vitals reviewed.  Constitutional:      General: She is not in acute distress.    Appearance: Normal appearance. She is not toxic-appearing.  HENT:     Head: Normocephalic and atraumatic.     Ears:     Comments: hoh Eyes:     Comments: Poor vision  Cardiovascular:     Rate and Rhythm: Normal rate and regular rhythm.     Pulses: Normal pulses.     Heart sounds: Normal heart sounds.  Pulmonary:     Effort: Pulmonary effort is normal.     Breath sounds: Normal breath sounds. No wheezing, rhonchi or rales.  Abdominal:     General: Bowel sounds are normal.     Palpations: Abdomen is soft.     Tenderness: There is no abdominal tenderness.  Musculoskeletal:        General: Normal range of motion.  Skin:    General: Skin is warm and dry.  Neurological:     General: No focal deficit present.     Mental Status: She is alert.     Comments: Oriented to person, place, time, but forgets our discussion over several minutes, what she ate for a meal within hours  Psychiatric:        Mood and Affect: Mood normal.     Labs reviewed: Basic Metabolic Panel: Recent Labs    10/12/18 1423 01/04/19 0000  NA 142 137  K 3.5 3.8  CL 111  --   CO2 24  --   GLUCOSE 77  --   BUN 12 9  CREATININE 0.87 0.8  CALCIUM 9.0  --    Liver Function Tests: Recent Labs    10/12/18 1423  AST 16  ALT 16  ALKPHOS 44  BILITOT 0.3  PROT 6.1*  ALBUMIN 3.3*   No results for input(s): LIPASE, AMYLASE in the last 8760 hours. No results for input(s): AMMONIA in the last 8760 hours. CBC: Recent Labs  10/12/18 1423 01/04/19 0000  WBC 8.0 7.9  NEUTROABS 3.2  --   HGB  12.6 35.7*  HCT 38.0 36  MCV 102.4*  --   PLT 125* 164   Lipid Panel: No results for input(s): CHOL, HDL, LDLCALC, TRIG, CHOLHDL, LDLDIRECT in the last 8760 hours. Lab Results  Component Value Date   HGBA1C 5.4 06/11/2019    Assessment/Plan 1. Anxiety -cont lorazepam 0.5mg  po bid and add a prn 1/2 tab mid-day when she cannot be calmed - LORazepam (ATIVAN) 0.5 MG tablet; Take 0.5 tablets (0.25 mg total) by mouth daily as needed for anxiety.  Dispense: 15 tablet; Refill: 0  2. Low vision, both eyes -cont AL level of care with caregivers 24x7  3. OAB (overactive bladder) -has purewick device to use prn when this is particularly bothersome at night--stopped consistent use due to irritation  4. Anal cancer ([place]) -s/p XRT, monitor, no new symptoms  5. Vascular dementia with behavior disturbance ([place]) -is progressing gradually; some prior documentation that she may have lewy body disease--not sure if this is the case vs depression with psychosis and dementia  -is on aricept and namenda   6. Depression, major, recurrent, in remission ([place]) -maintain bupropion--did not do well at all when this was taken away in the past  Labs/tests ordered:  No new Next appt:  Cancel 10/13/2019 and f/u in June now  [place]. Riad Wagley, D.O. [facility] [address], [place] [address] Cell Phone (Mon-Fri 8am-5pm):  [phone] On Call:  [phone] & follow prompts after 5pm & weekends Office Phone:  [phone] Office Fax:  [phone]

## 2019-10-13 ENCOUNTER — Encounter: Payer: Self-pay | Admitting: Internal Medicine

## 2019-11-05 ENCOUNTER — Other Ambulatory Visit: Payer: Self-pay | Admitting: Adult Health

## 2019-11-05 MED ORDER — LORAZEPAM 0.5 MG PO TABS: 0.5000 mg | ORAL_TABLET | Freq: Two times a day (BID) | ORAL | 2 refills | Status: DC

## 2019-11-17 ENCOUNTER — Non-Acute Institutional Stay: Payer: Medicare Other | Admitting: Internal Medicine

## 2019-11-17 ENCOUNTER — Encounter: Payer: Self-pay | Admitting: Internal Medicine

## 2019-11-17 ENCOUNTER — Other Ambulatory Visit: Payer: Self-pay

## 2019-11-17 VITALS — BP 118/72 | HR 74 | Temp 98.1°F | Wt 160.0 lb

## 2019-11-17 DIAGNOSIS — E1142 Type 2 diabetes mellitus with diabetic polyneuropathy: Secondary | ICD-10-CM | POA: Diagnosis not present

## 2019-11-17 DIAGNOSIS — F01518 Vascular dementia, unspecified severity, with other behavioral disturbance: Secondary | ICD-10-CM

## 2019-11-17 DIAGNOSIS — F0151 Vascular dementia with behavioral disturbance: Secondary | ICD-10-CM

## 2019-11-17 DIAGNOSIS — F419 Anxiety disorder, unspecified: Secondary | ICD-10-CM | POA: Diagnosis not present

## 2019-11-17 NOTE — Progress Notes (Signed)
Location:  Occupational psychologist of Service:  Clinic (12)  Provider: Lisamarie Coke L. Mariea Clonts, D.O., C.M.D.  Code Status: DNR Goals of Care:  Advanced Directives 11/17/2019  Does Patient Have a Medical Advance Directive? Yes  Type of Paramedic of [place];Out of facility DNR (pink MOST or yellow form);Living will  Does patient want to make changes to medical advance directive? No - Patient declined  Copy of [place] in Chart? Yes - validated most recent copy scanned in chart (See row information)  Would patient like information on creating a medical advance directive? -  Pre-existing out of facility DNR order (yellow form or pink MOST form) Pink MOST/Yellow Form most recent copy in chart - Physician notified to receive inpatient order   Chief Complaint  Patient presents with  . Acute Visit    Ingrown  toenail l big toe     HPI: Patient is a 79 y.o. female seen today for acute visit for suspected ingrown toenail on left great toe.  She reports the great toe is painful on the lateral aspect.  It also bothers her with sheets on it.  She has known neuropathy in her feet.    Past Medical History:  Diagnosis Date  . Anxiety   . Arthritis   . Cerebral aneurysm    s/p endovascular colling 2008  . CLL (chronic lymphocytic leukemia) (HCC)    CLL  . Depression   . Hearing loss   . Macular degeneration   . Neuropathy   . Paroxysmal atrial fibrillation (HCC)   . Pneumonia    08-04-17  . PONV (postoperative nausea and vomiting)   . Thyroid disease     Past Surgical History:  Procedure Laterality Date  . ANEURYSM COILING    . CATARACT EXTRACTION     bilateral  . CERVICAL CONE BIOPSY    . COLONOSCOPY    . EVALUATION UNDER ANESTHESIA WITH HEMORRHOIDECTOMY N/A 09/03/2017   Procedure: EXAM UNDER ANESTHESIA, EXCISION OF ANAL LESION, POSSIBLE HEMORRHOIDECTOMY;  Surgeon: Ileana Roup, MD;  Location: [facility];  Service: General;   Laterality: N/A;  . LESION REMOVAL N/A 07/22/2018   Procedure: EXCISION OF PERIANAL LESION;  Surgeon: Ileana Roup, MD;  Location: [facility];  Service: General;  Laterality: N/A;  . LUMBAR LAMINECTOMY/DECOMPRESSION MICRODISCECTOMY Right 04/07/2018   Procedure: Right Lumbar Four-Five Lumbar Five-Sacral One Laminectomy/Microdiscectomy;  Surgeon: Kristeen Miss, MD;  Location: [facility];  Service: Neurosurgery;  Laterality: Right;  Right Lumbar Four-Five Lumbar Five-Sacral One Laminectomy/Microdiscectomy  . RECTAL EXAM UNDER ANESTHESIA N/A 07/22/2018   Procedure: ANORECTAL EXAM UNDER ANESTHESIA ERAS PATHWAY;  Surgeon: Ileana Roup, MD;  Location: [facility];  Service: General;  Laterality: N/A;  . THYROIDECTOMY    . TONSILLECTOMY      Allergies  Allergen Reactions  . Ambien [Zolpidem Tartrate] Other (See Comments)    UNSPECIFIED REACTION   . Fioricet-Codeine [Butalbital-Apap-Caff-Cod] Other (See Comments)    UNSPECIFIED REACTION   . Macrobid [Nitrofurantoin] Other (See Comments)    UNSPECIFIED REACTION   . Codeine Other (See Comments)    About 50 years ago, took Codeine for scratched cornea while pregnant, and it made her extremely sedated.  . Hydrocodone-Acetaminophen Nausea And Vomiting  . Keflex [Cephalexin] Rash  . Percocet [Oxycodone-Acetaminophen] Nausea And Vomiting    Outpatient Encounter Medications as of 11/17/2019  Medication Sig  . acetaminophen (TYLENOL) 500 MG tablet Take 500 mg by mouth 2 (two) times a day.  8 am and 2 pm Patient may take 1 tablet as needed for pain twice daily.  Marland Kitchen buPROPion (WELLBUTRIN XL) 150 MG 24 hr tablet Take 150 mg by mouth daily. Take with the 300mg  tablet  . buPROPion (WELLBUTRIN XL) 300 MG 24 hr tablet Take 450 mg by mouth daily. Take with the 150mg  tablet  . COMBIGAN 0.2-0.5 % ophthalmic solution Place 1 drop into the left eye 2 (two) times daily.   Marland Kitchen diltiazem (CARDIZEM CD) 180 MG 24 hr capsule Take 180 mg by mouth daily.  Marland Kitchen donepezil (ARICEPT)  5 MG tablet Take 5 mg by mouth at bedtime.  . dorzolamide (TRUSOPT) 2 % ophthalmic solution Place 1 drop into the left eye 3 (three) times daily.   Marland Kitchen estrogen, conjugated,-medroxyprogesterone (PREMPRO) 0.625-2.5 MG tablet Take 1 tablet by mouth daily.  Marland Kitchen gabapentin (NEURONTIN) 100 MG capsule Take 100 mg by mouth 2 (two) times daily.  . hydrocortisone (ANUSOL-HC) 25 MG suppository Place 25 mg rectally 2 (two) times daily as needed for hemorrhoids or anal itching.  Marland Kitchen ibuprofen (ADVIL,MOTRIN) 400 MG tablet Take 1 tablet (400 mg total) by mouth every 8 (eight) hours as needed for moderate pain.  Marland Kitchen lamoTRIgine (LAMICTAL) 100 MG tablet Take 100 mg by mouth at bedtime.   Marland Kitchen latanoprost (XALATAN) 0.005 % ophthalmic solution Place 1 drop into the left eye at bedtime.   . Lidocaine (ASPERCREME LIDOCAINE) 4 % PTCH Apply 1 patch topically daily as needed (for lower back / right hip pain).   Marland Kitchen lidocaine (XYLOCAINE) 2 % jelly Apply 1 application topically every 8 (eight) hours as needed.  . loperamide (IMODIUM A-D) 2 MG tablet Take 4 mg by mouth daily as needed for diarrhea or loose stools.  Marland Kitchen LORazepam (ATIVAN) 0.5 MG tablet Take 0.5 tablets (0.25 mg total) by mouth daily as needed for anxiety.  Marland Kitchen LORazepam (ATIVAN) 0.5 MG tablet Take 1 tablet (0.5 mg total) by mouth 2 (two) times daily.  . meclizine (ANTIVERT) 25 MG tablet Take 25 mg by mouth 2 (two) times daily as needed for dizziness.  . memantine (NAMENDA) 5 MG tablet Take 5 mg by mouth at bedtime.   . ondansetron (ZOFRAN) 8 MG tablet Take 1 tablet (8 mg total) by mouth 2 (two) times daily as needed (Nausea or vomiting).  . polyethylene glycol (MIRALAX / GLYCOLAX) packet Take 17 g by mouth daily as needed for moderate constipation.  . Probiotic Product (ALIGN) 4 MG CAPS Take 4 mg by mouth daily.   . risperiDONE (RISPERDAL) 0.25 MG tablet Take 0.25 mg by mouth 2 (two) times daily.  . Wheat Dextrin (BENEFIBER PO) Take 15 mLs by mouth 2 (two) times a day. Stir  1 tablespoon in 4-8 oz of non- carbonated beverage until dissolved.   No facility-administered encounter medications on file as of 11/17/2019.    Review of Systems:  Review of Systems  Constitutional: Negative for chills and fever.  Neurological: Positive for tingling and sensory change.  Psychiatric/Behavioral: Positive for depression and memory loss. The patient is nervous/anxious.     Health Maintenance  Topic Date Due  . HEMOGLOBIN A1C  12/10/2019  . FOOT EXAM  12/21/2019  . OPHTHALMOLOGY EXAM  03/24/2020  . URINE MICROALBUMIN  06/09/2020  . TETANUS/TDAP  12/20/2026  . INFLUENZA VACCINE  Completed  . DEXA SCAN  Completed  . PNA vac Low Risk Adult  Completed    Physical Exam: Vitals:   11/17/19 1511  BP: 118/72  Pulse: 74  Temp: 98.1 F (36.7 C)  TempSrc: Temporal  SpO2: 97%  Weight: 160 lb (72.6 kg)   Body mass index is 26.63 kg/m. Physical Exam Vitals reviewed.  Constitutional:      Appearance: Normal appearance.  Cardiovascular:     Rate and Rhythm: Rhythm irregular.     Heart sounds: No murmur.  Pulmonary:     Effort: Pulmonary effort is normal.     Breath sounds: Normal breath sounds. No wheezing, rhonchi or rales.  Skin:    General: Skin is warm and dry.  Neurological:     Mental Status: She is alert.     Comments: Diminished sensation bilateral feet; great toenail does not appear ingrown--no significant erythema, warmth, drainage and toe not even really tender  Psychiatric:        Mood and Affect: Mood normal.     Labs reviewed: Basic Metabolic Panel: Recent Labs    01/04/19 0000  NA 137  K 3.8  BUN 9  CREATININE 0.8   Liver Function Tests: No results for input(s): AST, ALT, ALKPHOS, BILITOT, PROT, ALBUMIN in the last 8760 hours. No results for input(s): LIPASE, AMYLASE in the last 8760 hours. No results for input(s): AMMONIA in the last 8760 hours. CBC: Recent Labs    01/04/19 0000  WBC 7.9  HGB 35.7*  HCT 36  PLT 164   Lipid  Panel: No results for input(s): CHOL, HDL, LDLCALC, TRIG, CHOLHDL, LDLDIRECT in the last 8760 hours. Lab Results  Component Value Date   HGBA1C 5.4 06/11/2019     Assessment/Plan 1. Diabetic peripheral neuropathy associated with type 2 diabetes mellitus ([place]) -this seems to be the cause of her toe pain rather than an ingrown toenail, she is getting her nails trimmed and dremeled by podiatry here -cont bid gabapentin  2. Anxiety -cont ativan as currently ordered--she became too sedated on higher doses and her confusion worsens--reviewed again with caregiver who was requesting higher doses for her -cont wellbutrin and lamictal for bipolar also  3. Vascular dementia with behavior disturbance ([place]) -does not seem overtly different b/w visits -has 24x7 caregiver even when goes to [facility] [facility] home  Labs/tests ordered:  No new; keep regular podiatry appts for nail trimming Next appt:  02/09/2020  Auna Mikkelsen L. Jenipher Havel, D.O. [facility] [address], [place] [address] Cell Phone (Mon-Fri 8am-5pm):  [phone] On Call:  [phone] & follow prompts after 5pm & weekends Office Phone:  [phone] Office Fax:  [phone]

## 2019-12-22 ENCOUNTER — Encounter: Payer: Self-pay | Admitting: General Practice

## 2020-01-14 ENCOUNTER — Other Ambulatory Visit: Payer: Self-pay | Admitting: Internal Medicine

## 2020-01-14 MED ORDER — LATANOPROST 0.005 % OP SOLN: 1.0000 [drp] | Freq: Every day | OPHTHALMIC | 0 refills | Status: DC

## 2020-02-01 ENCOUNTER — Other Ambulatory Visit: Payer: Self-pay | Admitting: Internal Medicine

## 2020-02-01 MED ORDER — LORAZEPAM 0.5 MG PO TABS: 0.5000 mg | ORAL_TABLET | Freq: Two times a day (BID) | ORAL | 5 refills | Status: DC

## 2020-02-02 ENCOUNTER — Other Ambulatory Visit: Payer: Self-pay

## 2020-02-02 ENCOUNTER — Encounter: Admitting: Internal Medicine

## 2020-02-07 ENCOUNTER — Other Ambulatory Visit: Payer: Self-pay | Admitting: Internal Medicine

## 2020-02-07 NOTE — Telephone Encounter (Signed)
rx sent to pharmacy by e-script  

## 2020-02-09 ENCOUNTER — Encounter: Payer: Self-pay | Admitting: Internal Medicine

## 2020-02-09 ENCOUNTER — Non-Acute Institutional Stay: Payer: Medicare Other | Admitting: Internal Medicine

## 2020-02-09 ENCOUNTER — Other Ambulatory Visit: Payer: Self-pay

## 2020-02-09 VITALS — BP 122/64 | HR 67 | Temp 98.7°F | Ht 65.0 in | Wt 160.0 lb

## 2020-02-09 DIAGNOSIS — F419 Anxiety disorder, unspecified: Secondary | ICD-10-CM

## 2020-02-09 DIAGNOSIS — Z66 Do not resuscitate: Secondary | ICD-10-CM | POA: Diagnosis not present

## 2020-02-09 DIAGNOSIS — F418 Other specified anxiety disorders: Secondary | ICD-10-CM

## 2020-02-09 DIAGNOSIS — F0151 Vascular dementia with behavioral disturbance: Secondary | ICD-10-CM

## 2020-02-09 DIAGNOSIS — F01518 Vascular dementia, unspecified severity, with other behavioral disturbance: Secondary | ICD-10-CM

## 2020-02-09 DIAGNOSIS — R600 Localized edema: Secondary | ICD-10-CM

## 2020-02-09 MED ORDER — LAMOTRIGINE 150 MG PO TABS: 150.0000 mg | ORAL_TABLET | Freq: Every day | ORAL | 5 refills | Status: DC

## 2020-02-09 MED ORDER — MEMANTINE HCL 10 MG PO TABS: 10.0000 mg | ORAL_TABLET | Freq: Every day | ORAL | 5 refills | Status: AC

## 2020-02-09 NOTE — Progress Notes (Signed)
Location:  [facility] Provider:  Judithe Keetch L. Mariea Clonts, D.O., C.M.D.  Code Status: DNR, hospice Goals of Care:  Advanced Directives 02/09/2020  Does Patient Have a Medical Advance Directive? Yes  Type of Paramedic of [place];Out of facility DNR (pink MOST or yellow form);Living will  Does patient want to make changes to medical advance directive? No - Patient declined  Copy of [place] in Chart? Yes - validated most recent copy scanned in chart (See row information)  Would patient like information on creating a medical advance directive? -  Pre-existing out of facility DNR order (yellow form or pink MOST form) Pink MOST/Yellow Form most recent copy in chart - Physician notified to receive inpatient order     Chief Complaint  Patient presents with  . Medical Management of Chronic Issues    4 month follow up / swelling ankles    HPI: Patient is a 79 y.o. female seen today for medical management of chronic diseases.    Bethany Oconnor historically has slept on her stomach, but now uses a catheter so cannot sleep on her stomach.  Has difficulty going to sleep.  Bethany Oconnor has a lot of anxiety.  Her husband died three years ago and Bethany Oconnor says Bethany Oconnor has not gotten over it one inch.  Memory has gotten worse.  Bethany Oconnor is almost 79 yo.  [name] says the short-term is getting worse.  Bethany Oconnor gets agitated about not remembering.  They seem to think this requires ativan and request the dosage to be increased.  We discussed that perhaps being on the highest therapeutic dose of namenda might be more helpful.  Bethany Oconnor is having more swelling in her legs.  Bethany Oconnor's not using the compression stockings--Vanessa says they are not making a big difference especially with the right leg.    Bethany Oconnor c/o her right ear, but it appeared normal.  Past Medical History:  Diagnosis Date  . Anxiety   . Arthritis   . Cerebral aneurysm    s/p endovascular colling 2008  . CLL (chronic lymphocytic leukemia) (HCC)      CLL  . Depression   . Hearing loss   . Macular degeneration   . Neuropathy   . Paroxysmal atrial fibrillation (HCC)   . Pneumonia    08-04-17  . PONV (postoperative nausea and vomiting)   . Thyroid disease     Past Surgical History:  Procedure Laterality Date  . ANEURYSM COILING    . CATARACT EXTRACTION     bilateral  . CERVICAL CONE BIOPSY    . COLONOSCOPY    . EVALUATION UNDER ANESTHESIA WITH HEMORRHOIDECTOMY N/A 09/03/2017   Procedure: EXAM UNDER ANESTHESIA, EXCISION OF ANAL LESION, POSSIBLE HEMORRHOIDECTOMY;  Surgeon: Ileana Roup, MD;  Location: [facility];  Service: General;  Laterality: N/A;  . LESION REMOVAL N/A 07/22/2018   Procedure: EXCISION OF PERIANAL LESION;  Surgeon: Ileana Roup, MD;  Location: [facility];  Service: General;  Laterality: N/A;  . LUMBAR LAMINECTOMY/DECOMPRESSION MICRODISCECTOMY Right 04/07/2018   Procedure: Right Lumbar Four-Five Lumbar Five-Sacral One Laminectomy/Microdiscectomy;  Surgeon: Kristeen Miss, MD;  Location: [facility];  Service: Neurosurgery;  Laterality: Right;  Right Lumbar Four-Five Lumbar Five-Sacral One Laminectomy/Microdiscectomy  . RECTAL EXAM UNDER ANESTHESIA N/A 07/22/2018   Procedure: ANORECTAL EXAM UNDER ANESTHESIA ERAS PATHWAY;  Surgeon: Ileana Roup, MD;  Location: [facility];  Service: General;  Laterality: N/A;  . THYROIDECTOMY    . TONSILLECTOMY      Allergies  Allergen Reactions  .  Ambien [Zolpidem Tartrate] Other (See Comments)    UNSPECIFIED REACTION   . Fioricet-Codeine [Butalbital-Apap-Caff-Cod] Other (See Comments)    UNSPECIFIED REACTION   . Macrobid [Nitrofurantoin] Other (See Comments)    UNSPECIFIED REACTION   . Codeine Other (See Comments)    About 50 years ago, took Codeine for scratched cornea while pregnant, and it made her extremely sedated.  . Hydrocodone-Acetaminophen Nausea And Vomiting  . Keflex [Cephalexin] Rash  . Percocet [Oxycodone-Acetaminophen] Nausea And Vomiting    Outpatient  Encounter Medications as of 02/09/2020  Medication Sig  . acetaminophen (TYLENOL) 500 MG tablet Take 500 mg by mouth 2 (two) times a day. 8 am and 2 pm Patient may take 1 tablet as needed for pain twice daily.  Marland Kitchen buPROPion (WELLBUTRIN XL) 150 MG 24 hr tablet Take 150 mg by mouth daily. Take with the 300mg  tablet  . buPROPion (WELLBUTRIN XL) 300 MG 24 hr tablet Take 450 mg by mouth daily. Take with the 150mg  tablet  . COMBIGAN 0.2-0.5 % ophthalmic solution Place 1 drop into the left eye 2 (two) times daily.   Marland Kitchen diltiazem (CARDIZEM CD) 180 MG 24 hr capsule Take 180 mg by mouth daily.  Marland Kitchen donepezil (ARICEPT) 5 MG tablet Take 5 mg by mouth at bedtime.  . dorzolamide (TRUSOPT) 2 % ophthalmic solution Place 1 drop into the left eye 3 (three) times daily.   Marland Kitchen estrogen, conjugated,-medroxyprogesterone (PREMPRO) 0.625-2.5 MG tablet Take 1 tablet by mouth daily.  Marland Kitchen gabapentin (NEURONTIN) 100 MG capsule Take 100 mg by mouth 2 (two) times daily.  . hydrocortisone (ANUSOL-HC) 25 MG suppository Place 25 mg rectally 2 (two) times daily as needed for hemorrhoids or anal itching.  Marland Kitchen ibuprofen (ADVIL,MOTRIN) 400 MG tablet Take 1 tablet (400 mg total) by mouth every 8 (eight) hours as needed for moderate pain.  Marland Kitchen lamoTRIgine (LAMICTAL) 100 MG tablet Take 100 mg by mouth at bedtime.   Marland Kitchen latanoprost (XALATAN) 0.005 % ophthalmic solution PLACE 1 DROP INTO THE LEFT EYE AT BEDTIME.  Marland Kitchen Lidocaine (ASPERCREME LIDOCAINE) 4 % PTCH Apply 1 patch topically daily as needed (for lower back / right hip pain).   Marland Kitchen lidocaine (XYLOCAINE) 2 % jelly Apply 1 application topically every 8 (eight) hours as needed.  . loperamide (IMODIUM A-D) 2 MG tablet Take 4 mg by mouth daily as needed for diarrhea or loose stools.  Marland Kitchen LORazepam (ATIVAN) 0.5 MG tablet Take 0.5 tablets (0.25 mg total) by mouth daily as needed for anxiety.  Marland Kitchen LORazepam (ATIVAN) 0.5 MG tablet Take 1 tablet (0.5 mg total) by mouth 2 (two) times daily.  . meclizine (ANTIVERT)  25 MG tablet Take 25 mg by mouth 2 (two) times daily as needed for dizziness.  . memantine (NAMENDA) 5 MG tablet Take 5 mg by mouth at bedtime.   . ondansetron (ZOFRAN) 8 MG tablet Take 1 tablet (8 mg total) by mouth 2 (two) times daily as needed (Nausea or vomiting).  . polyethylene glycol (MIRALAX / GLYCOLAX) packet Take 17 g by mouth daily as needed for moderate constipation.  . Probiotic Product (ALIGN) 4 MG CAPS Take 4 mg by mouth daily.   . risperiDONE (RISPERDAL) 0.25 MG tablet Take 0.25 mg by mouth 2 (two) times daily.  . Wheat Dextrin (BENEFIBER PO) Take 15 mLs by mouth 2 (two) times a day. Stir 1 tablespoon in 4-8 oz of non- carbonated beverage until dissolved.   No facility-administered encounter medications on file as of 02/09/2020.    Review  of Systems:  Review of Systems  Constitutional: Positive for malaise/fatigue. Negative for chills and fever.  HENT: Positive for ear pain and hearing loss.   Eyes: Positive for blurred vision.  Respiratory: Negative for cough and shortness of breath.   Cardiovascular: Positive for leg swelling. Negative for chest pain and palpitations.  Gastrointestinal: Negative for abdominal pain, blood in stool, constipation, diarrhea and melena.  Genitourinary: Negative for dysuria.       Hs catheter   Musculoskeletal: Positive for back pain. Negative for falls.  Skin: Negative for itching and rash.  Neurological: Negative for dizziness and loss of consciousness.  Endo/Heme/Allergies: Bruises/bleeds easily.  Psychiatric/Behavioral: Positive for depression and memory loss. Negative for suicidal ideas. The patient is nervous/anxious and has insomnia.     Health Maintenance  Topic Date Due  . HEMOGLOBIN A1C  12/10/2019  . FOOT EXAM  12/21/2019  . OPHTHALMOLOGY EXAM  03/24/2020  . INFLUENZA VACCINE  04/02/2020  . URINE MICROALBUMIN  06/09/2020  . TETANUS/TDAP  12/20/2026  . DEXA SCAN  Completed  . COVID-19 Vaccine  Completed  . PNA vac Low Risk  Adult  Completed    Physical Exam: Vitals:   02/09/20 1133  Weight: 160 lb (72.6 kg)  Height: 5\' 5"  (1.651 m)   Body mass index is 26.63 kg/m. Physical Exam Vitals reviewed.  Constitutional:      General: Bethany Oconnor is not in acute distress.    Appearance: Normal appearance. Bethany Oconnor is not toxic-appearing.  HENT:     Head: Normocephalic and atraumatic.     Right Ear: Tympanic membrane, ear canal and external ear normal. There is no impacted cerumen.     Left Ear: Tympanic membrane, ear canal and external ear normal. There is no impacted cerumen.     Nose: Nose normal.  Eyes:     Extraocular Movements: Extraocular movements intact.  Cardiovascular:     Rate and Rhythm: Normal rate. Rhythm irregular.     Heart sounds: No murmur heard.   Pulmonary:     Effort: Pulmonary effort is normal.     Breath sounds: Normal breath sounds. No wheezing, rhonchi or rales.  Abdominal:     General: Bowel sounds are normal.     Palpations: Abdomen is soft.  Musculoskeletal:        General: Normal range of motion.     Right lower leg: Edema present.     Left lower leg: Edema present.     Comments: Right greater than left edema of ankle but mostly foot  Skin:    General: Skin is warm and dry.  Neurological:     General: No focal deficit present.     Mental Status: Bethany Oconnor is alert.     Cranial Nerves: No cranial nerve deficit.     Gait: Gait abnormal (uses manual wheelchair).  Psychiatric:        Mood and Affect: Mood normal.     Comments: Near tears discussing her husband's passing away from a few years ago now though Bethany Oconnor thinks it's more recent     Labs reviewed: Basic Metabolic Panel: No results for input(s): NA, K, CL, CO2, GLUCOSE, BUN, CREATININE, CALCIUM, MG, PHOS, TSH in the last 8760 hours. Liver Function Tests: No results for input(s): AST, ALT, ALKPHOS, BILITOT, PROT, ALBUMIN in the last 8760 hours. No results for input(s): LIPASE, AMYLASE in the last 8760 hours. No results for  input(s): AMMONIA in the last 8760 hours. CBC: No results for input(s): WBC, NEUTROABS, HGB,  HCT, MCV, PLT in the last 8760 hours. Lipid Panel: No results for input(s): CHOL, HDL, LDLCALC, TRIG, CHOLHDL, LDLDIRECT in the last 8760 hours. Lab Results  Component Value Date   HGBA1C 5.4 06/11/2019    Procedures since last visit: No results found.  Assessment/Plan 1. Depression with anxiety - increase lamictal for her bipolar depression - lamoTRIgine (LAMICTAL) 150 MG tablet; Take 1 tablet (150 mg total) by mouth daily.  Dispense: 30 tablet; Refill: 5  2. Vascular dementia with behavior disturbance ([place]) - is progressing and is not on max dose namenda so will gradually increase to the 10mg  po bid - memantine (NAMENDA) 10 MG tablet; Take 1 tablet (10 mg total) by mouth at bedtime.  Dispense: 30 tablet; Refill: 5  3. Anxiety -hoping adjustment of lamictal will be helpful -discouraged increasing ativan anymore b/c Bethany Oconnor was definitely oversedated on more before but caregiver denies this and pt does not remember it  4. DNR (do not resuscitate) - Do not attempt resuscitation (DNR)  5.  RLE edema -greater than left but both have some -not really new -elevate feet at rest and use compression hose as previously directed   Labs/tests ordered:  No new Next appt:  06/21/2020  Cabella Kimm L. Mazikeen Hehn, D.O. [facility] [address], [place] [address] Cell Phone (Mon-Fri 8am-5pm):  [phone] On Call:  [phone] & follow prompts after 5pm & weekends Office Phone:  [phone] Office Fax:  [phone]

## 2020-03-20 ENCOUNTER — Telehealth: Payer: Self-pay | Admitting: Family

## 2020-03-20 NOTE — Telephone Encounter (Signed)
Facility Nurse called on call provider states [place] request a one time order for her estrogen vaginal cream to be given on Thursday morning prior to [place] usually gets cream Monday ,Wednesday and Friday.Verbal order given to administer vaginal cream on Thursday morning.

## 2020-04-09 ENCOUNTER — Telehealth: Payer: Self-pay | Admitting: Family

## 2020-04-09 ENCOUNTER — Other Ambulatory Visit: Payer: Self-pay

## 2020-04-09 ENCOUNTER — Emergency Department (HOSPITAL_COMMUNITY)
Admission: EM | Admit: 2020-04-09 | Discharge: 2020-04-10 | Disposition: A | Discharge status: Skilled Nursing Facility | Attending: Emergency Medicine | Admitting: Emergency Medicine

## 2020-04-09 ENCOUNTER — Encounter (HOSPITAL_COMMUNITY): Payer: Self-pay

## 2020-04-09 DIAGNOSIS — F0390 Unspecified dementia without behavioral disturbance: Secondary | ICD-10-CM | POA: Diagnosis not present

## 2020-04-09 DIAGNOSIS — N183 Chronic kidney disease, stage 3 unspecified: Secondary | ICD-10-CM | POA: Diagnosis not present

## 2020-04-09 DIAGNOSIS — E1122 Type 2 diabetes mellitus with diabetic chronic kidney disease: Secondary | ICD-10-CM | POA: Insufficient documentation

## 2020-04-09 DIAGNOSIS — M79605 Pain in left leg: Secondary | ICD-10-CM | POA: Insufficient documentation

## 2020-04-09 DIAGNOSIS — E1140 Type 2 diabetes mellitus with diabetic neuropathy, unspecified: Secondary | ICD-10-CM | POA: Insufficient documentation

## 2020-04-09 DIAGNOSIS — Z87891 Personal history of nicotine dependence: Secondary | ICD-10-CM | POA: Insufficient documentation

## 2020-04-09 DIAGNOSIS — I824Y1 Acute embolism and thrombosis of unspecified deep veins of right proximal lower extremity: Secondary | ICD-10-CM

## 2020-04-09 DIAGNOSIS — I129 Hypertensive chronic kidney disease with stage 1 through stage 4 chronic kidney disease, or unspecified chronic kidney disease: Secondary | ICD-10-CM | POA: Diagnosis not present

## 2020-04-09 MED ORDER — LACTATED RINGERS IV BOLUS
1000.0000 mL | Freq: Once | INTRAVENOUS | Status: AC
  Administered 2020-04-10: 1000 mL via INTRAVENOUS

## 2020-04-09 NOTE — Telephone Encounter (Signed)
Facility Nurse called reports patient has worsening right leg edema.Acoording to Nurse patient had edema on her right ankle yesterday but has spread to groin area.Leg red, feels warmer and tender to touch.Unable to obtain stat U/s at the facility at night/weekend.Order given to send to ED for evaluation to rule out DVT.

## 2020-04-09 NOTE — ED Triage Notes (Signed)
Per EMS,  Pt is coming from [place], and is a hospice pt. Pt sent today over concerns regarding pain in her right thigh. Pain started last Thursday, hot to the touch, and swelling noted. Pain is 5/10 on palpation. Concern that it could be cancer in her lymph nodes.

## 2020-04-10 ENCOUNTER — Emergency Department (HOSPITAL_BASED_OUTPATIENT_CLINIC_OR_DEPARTMENT_OTHER)

## 2020-04-10 DIAGNOSIS — R609 Edema, unspecified: Secondary | ICD-10-CM | POA: Diagnosis not present

## 2020-04-10 DIAGNOSIS — R52 Pain, unspecified: Secondary | ICD-10-CM | POA: Diagnosis not present

## 2020-04-10 LAB — CBC WITH DIFFERENTIAL/PLATELET
Abs Immature Granulocytes: 0.05 10*3/uL (ref 0.00–0.07)
Basophils Absolute: 0 10*3/uL (ref 0.0–0.1)
Basophils Relative: 0 %
Eosinophils Absolute: 0.1 10*3/uL (ref 0.0–0.5)
Eosinophils Relative: 1 %
HCT: 37.1 % (ref 36.0–46.0)
Hemoglobin: 12.4 g/dL (ref 12.0–15.0)
Immature Granulocytes: 0 %
Lymphocytes Relative: 60 %
Lymphs Abs: 7.1 10*3/uL — ABNORMAL HIGH (ref 0.7–4.0)
MCH: 32.9 pg (ref 26.0–34.0)
MCHC: 33.4 g/dL (ref 30.0–36.0)
MCV: 98.4 fL (ref 80.0–100.0)
Monocytes Absolute: 0.8 10*3/uL (ref 0.1–1.0)
Monocytes Relative: 7 %
Neutro Abs: 3.8 10*3/uL (ref 1.7–7.7)
Neutrophils Relative %: 32 %
Platelets: 125 10*3/uL — ABNORMAL LOW (ref 150–400)
RBC: 3.77 MIL/uL — ABNORMAL LOW (ref 3.87–5.11)
RDW: 13.6 % (ref 11.5–15.5)
WBC: 11.9 10*3/uL — ABNORMAL HIGH (ref 4.0–10.5)
nRBC: 0 % (ref 0.0–0.2)

## 2020-04-10 LAB — COMPREHENSIVE METABOLIC PANEL
ALT: 9 U/L (ref 0–44)
AST: 15 U/L (ref 15–41)
Albumin: 3.3 g/dL — ABNORMAL LOW (ref 3.5–5.0)
Alkaline Phosphatase: 51 U/L (ref 38–126)
Anion gap: 9 (ref 5–15)
BUN: 11 mg/dL (ref 8–23)
CO2: 23 mmol/L (ref 22–32)
Calcium: 8.6 mg/dL — ABNORMAL LOW (ref 8.9–10.3)
Chloride: 108 mmol/L (ref 98–111)
Creatinine, Ser: 0.69 mg/dL (ref 0.44–1.00)
GFR calc Af Amer: >60 mL/min (ref >60)
GFR calc non Af Amer: >60 mL/min (ref >60)
Glucose, Bld: 94 mg/dL (ref 70–99)
Potassium: 4.3 mmol/L (ref 3.5–5.1)
Sodium: 140 mmol/L (ref 135–145)
Total Bilirubin: 0.5 mg/dL (ref 0.3–1.2)
Total Protein: 6.3 g/dL — ABNORMAL LOW (ref 6.5–8.1)

## 2020-04-10 LAB — PATHOLOGIST SMEAR REVIEW

## 2020-04-10 LAB — LACTIC ACID, PLASMA: Lactic Acid, Venous: 1.1 mmol/L (ref 0.5–1.9)

## 2020-04-10 MED ORDER — RIVAROXABAN 20 MG PO TABS: 20.0000 mg | ORAL_TABLET | Freq: Every day | ORAL | Status: DC

## 2020-04-10 MED ORDER — RIVAROXABAN 15 MG PO TABS
15.0000 mg | ORAL_TABLET | Freq: Two times a day (BID) | ORAL | Status: DC
  Administered 2020-04-10: 15 mg via ORAL
  Filled 2020-04-10: qty 1

## 2020-04-10 MED ORDER — RIVAROXABAN (XARELTO) VTE STARTER PACK (15 & 20 MG)
ORAL_TABLET | ORAL | 0 refills | Status: DC
Start: 2020-04-10 — End: 2020-06-23

## 2020-04-10 NOTE — ED Provider Notes (Signed)
Pt seen by Dr Dayna Barker.  Pt current hospice, palliative care.  If DVT study is negative dc home on abx.   DVT study positive on the right side Pt without resp sx.  Will dc home on xarelto.  Discussed findings and treatment with patient and caregiver.   Bethany Rank, MD 04/10/20 [phone]

## 2020-04-10 NOTE — ED Provider Notes (Signed)
[facility] DEPT Provider Note   CSN: [id number] Arrival date & time: 04/09/20  2232     History Chief Complaint  Patient presents with  . Leg Pain    Bethany Oconnor is a 79 y.o. female.  Patient is not the best historian however it seems that she always has some right greater than left leg swelling but apparent its more painful and redder than normal.  Her nurse practitioner at the facility performed a telephone consult and suggest that she come to the emergency room for DVT study.  Patient denies any recent fevers.  History limited by: patient's memory, Level V Caveat applies.  Leg Pain      Past Medical History:  Diagnosis Date  . Anxiety   . Arthritis   . Cerebral aneurysm    s/p endovascular colling 2008  . CLL (chronic lymphocytic leukemia) (HCC)    CLL  . Depression   . Hearing loss   . Macular degeneration   . Neuropathy   . Paroxysmal atrial fibrillation (HCC)   . Pneumonia    08-04-17  . PONV (postoperative nausea and vomiting)   . Thyroid disease     Patient Active Problem List   Diagnosis Date Noted  . Diabetic peripheral neuropathy associated with type 2 diabetes mellitus ([place]) 11/17/2019  . OAB (overactive bladder) 01/14/2019  . DNR (do not resuscitate)   . Lewy body dementia without behavioral disturbance ([place])   . Depression, recurrent ([place]) 10/19/2018  . Grief reaction with prolonged bereavement 10/19/2018  . Anal cancer ([place]) 08/03/2018  . Low vision, both eyes 04/14/2018  . S/P lumbar discectomy 04/14/2018  . Spinal stenosis of lumbar region with neurogenic claudication 04/14/2018  . Hemorrhoids 04/14/2018  . Hypercholesterolemia 04/14/2018  . 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 Presbycusis of both ears 04/14/2018  . PAF (paroxysmal atrial fibrillation) ([place])   . Macular degeneration   . Chronic low back pain   . Herniated nucleus  pulposus of lumbosacral region 04/07/2018  . Cerebral aneurysm   . Dementia with behavioral disturbance ([place]) 12/20/2016  . CLL (chronic lymphocytic leukemia) ([place]) 09/14/2014  . 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 HYPERTENSION, BENIGN 05/31/2010  . ATRIAL FIBRILLATION 05/31/2010    Past Surgical History:  Procedure Laterality Date  . ANEURYSM COILING    . CATARACT EXTRACTION     bilateral  . CERVICAL CONE BIOPSY    . COLONOSCOPY    . EVALUATION UNDER ANESTHESIA WITH HEMORRHOIDECTOMY N/A 09/03/2017   Procedure: EXAM UNDER ANESTHESIA, EXCISION OF ANAL LESION, POSSIBLE HEMORRHOIDECTOMY;  Surgeon: Ileana Roup, MD;  Location: [facility];  Service: General;  Laterality: N/A;  . LESION REMOVAL N/A 07/22/2018   Procedure: EXCISION OF PERIANAL LESION;  Surgeon: Ileana Roup, MD;  Location: [facility];  Service: General;  Laterality: N/A;  . LUMBAR LAMINECTOMY/DECOMPRESSION MICRODISCECTOMY Right 04/07/2018   Procedure: Right Lumbar Four-Five Lumbar Five-Sacral One Laminectomy/Microdiscectomy;  Surgeon: Kristeen Miss, MD;  Location: [facility];  Service: Neurosurgery;  Laterality: Right;  Right Lumbar Four-Five Lumbar Five-Sacral One Laminectomy/Microdiscectomy  . RECTAL EXAM UNDER ANESTHESIA N/A 07/22/2018   Procedure: ANORECTAL EXAM UNDER ANESTHESIA ERAS PATHWAY;  Surgeon: Ileana Roup, MD;  Location: [facility];  Service: General;  Laterality: N/A;  . THYROIDECTOMY    . TONSILLECTOMY       OB History   No obstetric history on file.  Family History  Problem Relation Age of Onset  . Cancer Father 62       died  . Cerebral aneurysm Mother 75       died  . Heart attack Brother     Social History   Tobacco Use  . Smoking status: Former Smoker    Quit date: 09/02/1988    Years since quitting: 31.6  . Smokeless tobacco: Never Used  Vaping Use  . Vaping Use: Never used  Substance Use Topics  . Alcohol use: No    Alcohol/week: 1.0 - 2.0 standard drink     Types: 1 - 2 Glasses of wine per week    Comment: daily for 50 years  No longer drinks 09-01-17  . Drug use: No    Home Medications Prior to Admission medications   Medication Sig Start Date End Date Taking? Authorizing Provider  acetaminophen (TYLENOL) 500 MG tablet Take 500 mg by mouth 2 (two) times a day. 8 am and 2 pm Patient may take 1 tablet as needed for pain twice daily.    [provider]  buPROPion (WELLBUTRIN XL) 150 MG 24 hr tablet Take 150 mg by mouth daily. Take with the 300mg  tablet 10/01/18   [provider]  buPROPion (WELLBUTRIN XL) 300 MG 24 hr tablet Take 450 mg by mouth daily. Take with the 150mg  tablet 02/11/18   [provider]  COMBIGAN 0.2-0.5 % ophthalmic solution Place 1 drop into the left eye 2 (two) times daily.  09/12/17   [provider]  diltiazem (CARDIZEM CD) 180 MG 24 hr capsule Take 180 mg by mouth daily.    [provider]  donepezil (ARICEPT) 5 MG tablet Take 5 mg by mouth at bedtime.    [provider]  dorzolamide (TRUSOPT) 2 % ophthalmic solution Place 1 drop into the left eye 3 (three) times daily.  10/07/18   [provider]  estrogen, conjugated,-medroxyprogesterone (PREMPRO) 0.625-2.5 MG tablet Take 1 tablet by mouth daily.    [provider]  gabapentin (NEURONTIN) 100 MG capsule Take 100 mg by mouth 2 (two) times daily.    [provider]  hydrocortisone (ANUSOL-HC) 25 MG suppository Place 25 mg rectally 2 (two) times daily as needed for hemorrhoids or anal itching.    [provider]  ibuprofen (ADVIL,MOTRIN) 400 MG tablet Take 1 tablet (400 mg total) by mouth every 8 (eight) hours as needed for moderate pain. 03/16/18   Jessy Oto, MD  lamoTRIgine (LAMICTAL) 150 MG tablet Take 1 tablet (150 mg total) by mouth daily. 02/09/20   Reed, Tiffany L, DO  latanoprost (XALATAN) 0.005 % ophthalmic solution PLACE 1 DROP INTO THE LEFT EYE AT BEDTIME. 02/07/20   Reed, Tiffany L,  DO  Lidocaine (ASPERCREME LIDOCAINE) 4 % PTCH Apply 1 patch topically daily as needed (for lower back / right hip pain).     [provider]  lidocaine (XYLOCAINE) 2 % jelly Apply 1 application topically every 8 (eight) hours as needed.    [provider]  loperamide (IMODIUM A-D) 2 MG tablet Take 4 mg by mouth daily as needed for diarrhea or loose stools.    [provider]  LORazepam (ATIVAN) 0.5 MG tablet Take 0.5 tablets (0.25 mg total) by mouth daily as needed for anxiety. 10/06/19   Reed, Tiffany L, DO  LORazepam (ATIVAN) 0.5 MG tablet Take 1 tablet (0.5 mg total) by mouth 2 (two) times daily. 02/01/20   Reed, Tiffany L, DO  meclizine (ANTIVERT) 25 MG tablet Take 25 mg by mouth 2 (two) times daily as needed for dizziness.    [provider]  memantine (NAMENDA) 10 MG tablet Take 1 tablet (10 mg total) by mouth at bedtime. 02/09/20   Reed, Tiffany L, DO  ondansetron (ZOFRAN) 8 MG tablet Take 1 tablet (8 mg total) by mouth 2 (two) times daily as needed (Nausea or vomiting). 09/09/18   Truitt Merle, MD  polyethylene glycol [facility] / Floria Raveling) packet Take 17 g by mouth daily as needed for moderate constipation.    [provider]  Probiotic Product (ALIGN) 4 MG CAPS Take 4 mg by mouth daily.     [provider]  risperiDONE (RISPERDAL) 0.25 MG tablet Take 0.25 mg by mouth 2 (two) times daily.    [provider]  Wheat Dextrin (BENEFIBER PO) Take 15 mLs by mouth 2 (two) times a day. Stir 1 tablespoon in 4-8 oz of non- carbonated beverage until dissolved.    [provider]    Allergies    Ambien [zolpidem tartrate], Fioricet-codeine [butalbital-apap-caff-cod], Macrobid [nitrofurantoin], Codeine, Hydrocodone-acetaminophen, Keflex [cephalexin], and Percocet [oxycodone-acetaminophen]  Review of Systems   Review of Systems  Unable to perform ROS: Dementia    Physical Exam Updated Vital Signs BP 132/68 (BP Location: Left Arm)   Pulse  67   Temp 97.9 F (36.6 C) (Oral)   Resp 20   SpO2 99%   Physical Exam Vitals and nursing note reviewed.  Constitutional:      Appearance: She is well-developed.  HENT:     Head: Normocephalic and atraumatic.     Mouth/Throat:     Mouth: Mucous membranes are moist.     Pharynx: Oropharynx is clear.  Eyes:     Conjunctiva/sclera: Conjunctivae normal.     Pupils: Pupils are equal, round, and reactive to light.  Cardiovascular:     Rate and Rhythm: Normal rate and regular rhythm.  Pulmonary:     Effort: No respiratory distress.     Breath sounds: No stridor.  Abdominal:     General: There is no distension.  Musculoskeletal:        General: Swelling (R>L) present. No deformity or signs of injury. Normal range of motion.     Cervical back: Normal range of motion.     Right lower leg: Edema present.     Left lower leg: Edema present.  Skin:    Findings: Erythema (to most of rle) present.  Neurological:     General: No focal deficit present.     Mental Status: She is alert.     ED Results / Procedures / Treatments   Labs (all labs ordered are listed, but only abnormal results are displayed) Labs Reviewed  CBC WITH DIFFERENTIAL/PLATELET - Abnormal; Notable for the following components:      Result Value   WBC 11.9 (*)    RBC 3.77 (*)    Platelets 125 (*)    All other components within normal limits  LACTIC ACID, PLASMA  COMPREHENSIVE METABOLIC PANEL  LACTIC ACID, PLASMA    EKG None  Radiology No results found.  Procedures Procedures (including critical care time)  Medications Ordered in ED Medications  lactated ringers bolus 1,000 mL (1,000 mLs Intravenous New Bag/Given 04/10/20 0011)    ED Course  I have reviewed the triage vital signs and the nursing notes.  Pertinent labs & imaging results that were available during my care of the patient were reviewed by me and  considered in my medical decision making (see chart for details).    MDM  Rules/Calculators/A&P                          Possibly DVT, but more likely cellulitis, will eval with labs for e/o sepsis or other reason to admit to hospital.   Patient is a hospice patient as I discussed with the floor care hospice on-call nurse.  Apparently part of her care plan is to not be admitted to the hospital.  After discussion of the symptoms with the nurse we decided it may be the best course of action would be to do a DVT study in the morning to ensure there is or is not a DVT.  If this is negative to send her back to the facility on antibiotics as that would be her wishes.  If the DVT study is positive then to reengage hospice for management options per the patient wishes.  Patient stable in the emergency room throughout her stay.  Care transferred pending ultrasound.   Final Clinical Impression(s) / ED Diagnoses Final diagnoses:  None    Rx / DC Orders ED Discharge Orders    None       Jernie Schutt, Corene Cornea, MD 04/10/20 [phone]

## 2020-04-10 NOTE — Progress Notes (Signed)
Bilateral lower extremity venous duplex has been completed. Preliminary results can be found in CV Proc through chart review.  Results were given to Dr. Tomi Bamberger.  04/10/20 8:50 AM Bethany Oconnor RVT

## 2020-04-10 NOTE — Discharge Instructions (Signed)
Information on my medicine - XARELTO (rivaroxaban)  This medication education was reviewed with me or my healthcare representative as part of my discharge preparation.   WHY WAS XARELTO PRESCRIBED FOR YOU? Xarelto was prescribed to treat blood clots that may have been found in the veins of your legs (deep vein thrombosis) or in your lungs (pulmonary embolism) and to reduce the risk of them occurring again.  What do you need to know about Xarelto? The starting dose is one 15 mg tablet taken TWICE daily with food for the FIRST 21 DAYS then on 05/01/20  the dose is changed to one 20 mg tablet taken ONCE A DAY with your evening meal.  DO NOT stop taking Xarelto without talking to the health care provider who prescribed the medication.  Refill your prescription for 20 mg tablets before you run out.  After discharge, you should have regular check-up appointments with your healthcare provider that is prescribing your Xarelto.  In the future your dose may need to be changed if your kidney function changes by a significant amount.  What do you do if you miss a dose? If you are taking Xarelto TWICE DAILY and you miss a dose, take it as soon as you remember. You may take two 15 mg tablets (total 30 mg) at the same time then resume your regularly scheduled 15 mg twice daily the next day.  If you are taking Xarelto ONCE DAILY and you miss a dose, take it as soon as you remember on the same day then continue your regularly scheduled once daily regimen the next day. Do not take two doses of Xarelto at the same time.   Important Safety Information Xarelto is a blood thinner medicine that can cause bleeding. You should call your healthcare provider right away if you experience any of the following: ? Bleeding from an injury or your nose that does not stop. ? Unusual colored urine (red or dark brown) or unusual colored stools (red or black). ? Unusual bruising for unknown reasons. ? A serious fall or  if you hit your head (even if there is no bleeding).  Some medicines may interact with Xarelto and might increase your risk of bleeding while on Xarelto. To help avoid this, consult your healthcare provider or pharmacist prior to using any new prescription or non-prescription medications, including herbals, vitamins, non-steroidal anti-inflammatory drugs (NSAIDs) and supplements.  This website has more information on Xarelto: [email or web address]

## 2020-04-11 ENCOUNTER — Encounter: Payer: Self-pay | Admitting: Internal Medicine

## 2020-04-11 ENCOUNTER — Non-Acute Institutional Stay: Payer: Medicare Other | Admitting: Internal Medicine

## 2020-04-11 DIAGNOSIS — F0151 Vascular dementia with behavioral disturbance: Secondary | ICD-10-CM | POA: Diagnosis not present

## 2020-04-11 DIAGNOSIS — I82491 Acute embolism and thrombosis of other specified deep vein of right lower extremity: Secondary | ICD-10-CM | POA: Diagnosis not present

## 2020-04-11 DIAGNOSIS — F418 Other specified anxiety disorders: Secondary | ICD-10-CM

## 2020-04-11 DIAGNOSIS — F419 Anxiety disorder, unspecified: Secondary | ICD-10-CM

## 2020-04-11 DIAGNOSIS — F01518 Vascular dementia, unspecified severity, with other behavioral disturbance: Secondary | ICD-10-CM

## 2020-04-11 NOTE — Progress Notes (Signed)
Location:  [facility] Room Number: 154 Place of Service:  ALF (13) Provider:  Elba Dendinger L. Mariea Clonts, D.O., C.M.D.  Gayland Curry, DO  Patient Care Team: Gayland Curry, DO as PCP - General (Geriatric Medicine) Sherren Mocha, MD as PCP - Cardiology (Cardiology) Crist Infante, MD (Internal Medicine) Newton Pigg, MD as Consulting Physician (Obstetrics and Gynecology) Truitt Merle, MD as Consulting Physician (Hematology) Leta Baptist, MD as Consulting Physician (Otolaryngology) Sherlynn Stalls, MD as Consulting Physician (Ophthalmology) Paulla Dolly Tamala Fothergill, DPM as Consulting Physician (Podiatry) Harriett Sine, MD as Consulting Physician (Dermatology) Erroll Luna, MD as Consulting Physician (General Surgery)  Extended Emergency Contact Information Primary Emergency Contact: [place], [place] Phone: [phone] Work Phone: [phone] Relation: None Secondary Emergency Contact: Madelin Headings Mobile Phone: [phone] Relation: Other  Code Status:  DNR, hospice Goals of care: Advanced Directive information Advanced Directives 04/11/2020  Does Patient Have a Medical Advance Directive? Yes  Type of Paramedic of [place];Out of facility DNR (pink MOST or yellow form);Living will  Does patient want to make changes to medical advance directive? No - Patient declined  Copy of [place] in Chart? Yes - validated most recent copy scanned in chart (See row information)  Would patient like information on creating a medical advance directive? -  Pre-existing out of facility DNR order (yellow form or pink MOST form) Pink MOST/Yellow Form most recent copy in chart - Physician notified to receive inpatient order     Chief Complaint  Patient presents with  . Acute Visit    s/p ED visit with right lower leg DVt on 04/09/2020    HPI:  Pt is a 79 y.o. female with h/o anal cancer, dementia, bipolar depression,  among others seen today for an acute visit for f/u after ED visit for right leg swelling.  Pt was sent out and had to wait in ED anyway to get venous doppler done.  Was positive for DVT.  She was started on xarelto for DVT tx.  Pt is worried about her swelling.  We discussed that it will gradually improve but it takes months for a clot to dissipate and the purpose of the medication is to prevent more clots and PE.  She felt much better after the discussion.  Both day and evening caregivers were present to hear the whole discussion.   Past Medical History:  Diagnosis Date  . Anxiety   . Arthritis   . Cerebral aneurysm    s/p endovascular colling 2008  . CLL (chronic lymphocytic leukemia) (HCC)    CLL  . Depression   . Hearing loss   . Macular degeneration   . Neuropathy   . Paroxysmal atrial fibrillation (HCC)   . Pneumonia    08-04-17  . PONV (postoperative nausea and vomiting)   . Thyroid disease    Past Surgical History:  Procedure Laterality Date  . ANEURYSM COILING    . CATARACT EXTRACTION     bilateral  . CERVICAL CONE BIOPSY    . COLONOSCOPY    . EVALUATION UNDER ANESTHESIA WITH HEMORRHOIDECTOMY N/A 09/03/2017   Procedure: EXAM UNDER ANESTHESIA, EXCISION OF ANAL LESION, POSSIBLE HEMORRHOIDECTOMY;  Surgeon: Ileana Roup, MD;  Location: [facility];  Service: General;  Laterality: N/A;  . LESION REMOVAL N/A 07/22/2018   Procedure: EXCISION OF PERIANAL LESION;  Surgeon: Ileana Roup, MD;  Location: [facility];  Service: General;  Laterality: N/A;  . LUMBAR LAMINECTOMY/DECOMPRESSION MICRODISCECTOMY Right 04/07/2018  Procedure: Right Lumbar Four-Five Lumbar Five-Sacral One Laminectomy/Microdiscectomy;  Surgeon: Kristeen Miss, MD;  Location: [facility];  Service: Neurosurgery;  Laterality: Right;  Right Lumbar Four-Five Lumbar Five-Sacral One Laminectomy/Microdiscectomy  . RECTAL EXAM UNDER ANESTHESIA N/A 07/22/2018   Procedure: ANORECTAL EXAM UNDER ANESTHESIA ERAS PATHWAY;  Surgeon:  Ileana Roup, MD;  Location: [facility];  Service: General;  Laterality: N/A;  . THYROIDECTOMY    . TONSILLECTOMY      Allergies  Allergen Reactions  . Ambien [Zolpidem Tartrate] Other (See Comments)    UNSPECIFIED REACTION   . Fioricet-Codeine [Butalbital-Apap-Caff-Cod] Other (See Comments)    UNSPECIFIED REACTION   . Macrobid [Nitrofurantoin] Other (See Comments)    UNSPECIFIED REACTION   . Codeine Other (See Comments)    About 50 years ago, took Codeine for scratched cornea while pregnant, and it made her extremely sedated.  . Hydrocodone-Acetaminophen Nausea And Vomiting  . Keflex [Cephalexin] Rash  . Percocet [Oxycodone-Acetaminophen] Nausea And Vomiting    Outpatient Encounter Medications as of 04/11/2020  Medication Sig  . acetaminophen (TYLENOL) 500 MG tablet Take 500 mg by mouth See admin instructions. 500mg  twice a day scheduled and may take 500mg  twice a day as needed for pain twice daily.  Marland Kitchen albuterol (ACCUNEB) 0.63 MG/3ML nebulizer solution Take 1 ampule by nebulization every 6 (six) hours as needed for wheezing.  Marland Kitchen buPROPion (WELLBUTRIN XL) 150 MG 24 hr tablet Take 150 mg by mouth daily. Take with the 300mg  tablet for total 450mg .  . buPROPion (WELLBUTRIN XL) 300 MG 24 hr tablet Take 450 mg by mouth daily. Take with the 150mg  tablet for total 450mg .  . COMBIGAN 0.2-0.5 % ophthalmic solution Place 1 drop into the left eye 2 (two) times daily.   Marland Kitchen dextromethorphan-guaiFENesin (ROBAFEN DM CGH/CHEST CONGEST) 10-100 MG/5ML liquid Take 10 mLs by mouth every 4 (four) hours as needed for cough.  . diltiazem (CARDIZEM CD) 180 MG 24 hr capsule Take 180 mg by mouth daily.  . Diphenhyd-Hydrocort-Nystatin (FIRST-DUKES MOUTHWASH MT) Use as directed 5 mLs in the mouth or throat 4 (four) times daily as needed (mouth discomfort). Swish and spit.  Marland Kitchen donepezil (ARICEPT) 5 MG tablet Take 5 mg by mouth at bedtime.  . dorzolamide (TRUSOPT) 2 % ophthalmic solution Place 1 drop into the left  eye 3 (three) times daily.   Marland Kitchen estradiol (ESTRACE) 0.1 MG/GM vaginal cream Place 1 Applicatorful vaginally See admin instructions. Administer 3 times a week on Monday, Thursday, and Saturday.  . estrogen, conjugated,-medroxyprogesterone (PREMPRO) 0.625-2.5 MG tablet Take 1 tablet by mouth daily.  . Eyelid Cleansers (OCUSOFT EYELID CLEANSING) PADS Apply 1 each topically daily as needed (discharge in eyes).  . gabapentin (NEURONTIN) 100 MG capsule Take 100 mg by mouth 2 (two) times daily.  . hydrocortisone (ANUSOL-HC) 25 MG suppository Place 25 mg rectally 2 (two) times daily as needed for hemorrhoids or anal itching.  . lamoTRIgine (LAMICTAL) 150 MG tablet Take 1 tablet (150 mg total) by mouth daily. (Patient taking differently: Take 150 mg by mouth daily. Increase lamictal to 150 mg qhs for bipolar depression)  . latanoprost (XALATAN) 0.005 % ophthalmic solution PLACE 1 DROP INTO THE LEFT EYE AT BEDTIME.  Marland Kitchen Lidocaine (ASPERCREME LIDOCAINE) 4 % PTCH Apply 1 patch topically daily as needed (for lower back / right hip pain).   Marland Kitchen lidocaine (XYLOCAINE) 2 % jelly Apply 1 application topically every 8 (eight) hours as needed (to vaginal area for catheter discomfort).   Marland Kitchen loperamide (IMODIUM A-D) 2 MG  tablet Take 4 mg by mouth daily as needed for diarrhea or loose stools.  Marland Kitchen LORazepam (ATIVAN) 0.5 MG tablet Take 0.5 tablets (0.25 mg total) by mouth daily as needed for anxiety.  Marland Kitchen LORazepam (ATIVAN) 0.5 MG tablet Take 1 tablet (0.5 mg total) by mouth 2 (two) times daily.  . meclizine (ANTIVERT) 25 MG tablet Take 25 mg by mouth 2 (two) times daily as needed for dizziness.  . memantine (NAMENDA) 10 MG tablet Take 1 tablet (10 mg total) by mouth at bedtime.  . ondansetron (ZOFRAN-ODT) 8 MG disintegrating tablet Take 8 mg by mouth 2 (two) times daily as needed for nausea or vomiting.  . phenylephrine-shark liver oil-mineral oil-petrolatum (PREPARATION H) 0.25-14-74.9 % rectal ointment Place 1 application rectally  4 (four) times daily as needed for hemorrhoids.  . polyethylene glycol (MIRALAX / GLYCOLAX) packet Take 17 g by mouth daily as needed for moderate constipation.  . Probiotic Product (ALIGN) 4 MG CAPS Take 4 mg by mouth daily.   . risperiDONE (RISPERDAL) 0.25 MG tablet Take 0.25 mg by mouth 2 (two) times daily.  Marland Kitchen RIVAROXABAN (XARELTO) VTE STARTER PACK (15 & 20 MG TABLETS) Follow package directions: Take one 15mg  tablet by mouth twice a day. On day 22, switch to one 20mg  tablet once a day. Take with food.  . sennosides-docusate sodium (SENOKOT-S) 8.6-50 MG tablet Take 1 tablet by mouth in the morning and at bedtime. Hold for loose stools or diarrhea.  . Wheat Dextrin (BENEFIBER PO) Take 15 mLs by mouth 2 (two) times daily as needed (constipation). Stir 1 tablespoon in 4-8 oz of non- carbonated beverage until dissolved.  . [DISCONTINUED] guaiFENesin-dextromethorphan (ROBITUSSIN DM) 100-10 MG/5ML syrup Take 10 mLs by mouth every 6 (six) hours as needed for cough (congestion).   No facility-administered encounter medications on file as of 04/11/2020.    Review of Systems  Constitutional: Negative for chills and fever.  Respiratory: Negative for shortness of breath.   Cardiovascular: Negative for chest pain.  Gastrointestinal: Negative for abdominal pain.  Genitourinary: Negative for dysuria.  Musculoskeletal: Negative for falls.       Right leg pain and swelling, erythema  Neurological: Negative for dizziness and loss of consciousness.  Psychiatric/Behavioral: Positive for depression and memory loss. Negative for suicidal ideas. The patient is nervous/anxious.     Immunization History  Administered Date(s) Administered  . Influenza, High Dose Seasonal PF 06/19/2018  . Influenza,inj,Quad PF,6+ Mos 07/01/2019  . Influenza-Unspecified 06/27/2016  . Moderna SARS-COVID-2 Vaccination 09/24/2019, 10/12/2019  . Pneumococcal Conjugate-13 07/21/2013, 04/15/2016  . Pneumococcal Polysaccharide-23  03/14/2010  . Td 03/09/2010  . Tdap 12/19/2016  . Zoster 08/02/2005, 03/14/2010  . Zoster Recombinat (Shingrix) 06/18/2017, 10/22/2017   Pertinent  Health Maintenance Due  Topic Date Due  . HEMOGLOBIN A1C  12/10/2019  . FOOT EXAM  12/21/2019  . OPHTHALMOLOGY EXAM  03/24/2020  . INFLUENZA VACCINE  04/02/2020  . URINE MICROALBUMIN  06/09/2020  . DEXA SCAN  Completed  . PNA vac Low Risk Adult  Completed   Fall Risk  02/09/2020 11/17/2019 10/06/2019 06/09/2019 05/05/2019  Falls in the past year? 0 0 0 1 0  Number falls in past yr: 0 0 0 0 0  Injury with Fall? 0 0 0 0 0  Follow up - - - - -   Functional Status Survey:    Vitals:   04/11/20 1610  BP: 130/75  Pulse: 70  Temp: 97.7 F (36.5 C)  SpO2: 92%  Weight: 167 lb 9.6  oz (76 kg)  Height: 5\' 5"  (1.651 m)   Body mass index is 27.89 kg/m. Physical Exam Vitals reviewed.  Constitutional:      General: She is not in acute distress.    Appearance: Normal appearance. She is not ill-appearing or toxic-appearing.  Cardiovascular:     Rate and Rhythm: Normal rate and regular rhythm.  Pulmonary:     Effort: Pulmonary effort is normal.     Breath sounds: Normal breath sounds.  Abdominal:     General: Bowel sounds are normal.  Musculoskeletal:        General: Swelling and tenderness present.     Right lower leg: Edema present.  Skin:    General: Skin is warm and dry.     Comments: Erythema of right lower leg into foot  Neurological:     General: No focal deficit present.     Mental Status: She is alert. Mental status is at baseline.  Psychiatric:        Mood and Affect: Mood normal.        Behavior: Behavior normal.     Comments: Worrying about the clot     Labs reviewed: Recent Labs    04/09/20 2359  NA 140  K 4.3  CL 108  CO2 23  GLUCOSE 94  BUN 11  CREATININE 0.69  CALCIUM 8.6*   Recent Labs    04/09/20 2359  AST 15  ALT 9  ALKPHOS 51  BILITOT 0.5  PROT 6.3*  ALBUMIN 3.3*   Recent Labs     04/09/20 2359  WBC 11.9*  NEUTROABS 3.8  HGB 12.4  HCT 37.1  MCV 98.4  PLT 125*   Lab Results  Component Value Date   TSH 1.78 11/11/2017   Lab Results  Component Value Date   HGBA1C 5.4 06/11/2019   Lab Results  Component Value Date   CHOL 146 11/11/2017   HDL 42 11/11/2017   LDLCALC 63 11/11/2017   TRIG 207 (A) 11/11/2017    Significant Diagnostic Results in last 30 days:  VAS [place] LOWER EXTREMITY VENOUS (DVT) (ONLY [facility])  Result Date: 04/10/2020  Lower Venous DVTStudy Indications: Edema, and Pain.  Risk Factors: None identified. Limitations: Body habitus, poor ultrasound/tissue interface and patient pain tolerance. Comparison Study: No prior studies. Performing Technologist: Oliver Hum RVT  Examination Guidelines: A complete evaluation includes B-mode imaging, spectral Doppler, color Doppler, and power Doppler as needed of all accessible portions of each vessel. Bilateral testing is considered an integral part of a complete examination. Limited examinations for reoccurring indications may be performed as noted. The reflux portion of the exam is performed with the patient in reverse Trendelenburg.  +---------+---------------+---------+-----------+----------+--------------+ RIGHT    CompressibilityPhasicitySpontaneityPropertiesThrombus Aging +---------+---------------+---------+-----------+----------+--------------+ CFV      None           No       No                   Acute          +---------+---------------+---------+-----------+----------+--------------+ SFJ      None                                         Acute          +---------+---------------+---------+-----------+----------+--------------+ FV Prox  None           No  No                   Acute          +---------+---------------+---------+-----------+----------+--------------+ FV Mid   None           No       No                   Acute           +---------+---------------+---------+-----------+----------+--------------+ FV DistalNone           No       No                   Acute          +---------+---------------+---------+-----------+----------+--------------+ PFV      None                                         Acute          +---------+---------------+---------+-----------+----------+--------------+ POP      None           No       No                   Acute          +---------+---------------+---------+-----------+----------+--------------+ PTV      Partial                                      Acute          +---------+---------------+---------+-----------+----------+--------------+ PERO     None                                         Acute          +---------+---------------+---------+-----------+----------+--------------+ EIV      None           No       No                   Acute          +---------+---------------+---------+-----------+----------+--------------+ CIV      Full           Yes      Yes                                 +---------+---------------+---------+-----------+----------+--------------+   +---------+---------------+---------+-----------+----------+--------------+ LEFT     CompressibilityPhasicitySpontaneityPropertiesThrombus Aging +---------+---------------+---------+-----------+----------+--------------+ CFV      Full           Yes      Yes                                 +---------+---------------+---------+-----------+----------+--------------+ SFJ      Full                                                        +---------+---------------+---------+-----------+----------+--------------+ FV Prox  Full                                                        +---------+---------------+---------+-----------+----------+--------------+ FV Mid   Full                                                         +---------+---------------+---------+-----------+----------+--------------+ FV DistalFull                                                        +---------+---------------+---------+-----------+----------+--------------+ PFV      Full                                                        +---------+---------------+---------+-----------+----------+--------------+ POP      Full           Yes      Yes                                 +---------+---------------+---------+-----------+----------+--------------+ PTV      Full                                                        +---------+---------------+---------+-----------+----------+--------------+ PERO     Full                                                        +---------+---------------+---------+-----------+----------+--------------+     Summary: RIGHT: - Findings consistent with acute deep vein thrombosis involving the right external iliac vein, common femoral vein, right femoral vein, SF junction, right proximal profunda vein, right popliteal vein, right posterior tibial veins, and right peroneal veins. - No cystic structure found in the popliteal fossa.  LEFT: - There is no evidence of deep vein thrombosis in the lower extremity.  - No cystic structure found in the popliteal fossa.  *See table(s) above for measurements and observations. Electronically signed by Ruta Hinds MD on 04/10/2020 at 5:16:23 PM.    Final     Assessment/Plan 1. Acute deep vein thrombosis (DVT) of other specified vein of right lower extremity ([place]) -could mean a recurrence of malignancy or may be due to her hormones she's opted to continue to take at 79 due to getting "hateful" and agitated when weaning attempts tried in years past -cont xarelto and monitor  2. Anxiety -is very worried about above, counseling provided as in HPI  3. Vascular dementia with behavior disturbance (  [place]) -gradually progressing, cont caregiver support in  AL  Family/ staff Communication: discussed with pt, caregivers present from both shifts, pt was calm and eating her supper when I left  Labs/tests ordered:  No new  Tylique Aull L. Slyvia Lartigue, D.O. [facility] [address], [place] [address] Cell Phone (Mon-Fri 8am-5pm):  [phone] On Call:  [phone] & follow prompts after 5pm & weekends Office Phone:  [phone] Office Fax:  [phone]

## 2020-04-14 ENCOUNTER — Non-Acute Institutional Stay: Payer: Medicare Other | Admitting: Adult Health

## 2020-04-14 ENCOUNTER — Encounter: Payer: Self-pay | Admitting: Adult Health

## 2020-04-14 DIAGNOSIS — M810 Age-related osteoporosis without current pathological fracture: Secondary | ICD-10-CM

## 2020-04-14 DIAGNOSIS — R600 Localized edema: Secondary | ICD-10-CM | POA: Diagnosis not present

## 2020-04-14 DIAGNOSIS — I82421 Acute embolism and thrombosis of right iliac vein: Secondary | ICD-10-CM

## 2020-04-14 NOTE — Progress Notes (Signed)
Location:  Occupational psychologist of Service:  ALF (13) Provider:   Cindi Carbon, ANP [place] [phone]   Gayland Curry, DO  Patient Care Team: Gayland Curry, DO as PCP - General (Geriatric Medicine) Sherren Mocha, MD as PCP - Cardiology (Cardiology) Crist Infante, MD (Internal Medicine) Newton Pigg, MD as Consulting Physician (Obstetrics and Gynecology) Truitt Merle, MD as Consulting Physician (Hematology) Leta Baptist, MD as Consulting Physician (Otolaryngology) Sherlynn Stalls, MD as Consulting Physician (Ophthalmology) Paulla Dolly Tamala Fothergill, DPM as Consulting Physician (Podiatry) Harriett Sine, MD as Consulting Physician (Dermatology) Erroll Luna, MD as Consulting Physician (General Surgery)  Extended Emergency Contact Information Primary Emergency Contact: [place], [place] Phone: [phone] Work Phone: [phone] Relation: None Secondary Emergency Contact: Madelin Headings Mobile Phone: [phone] Relation: Other  Code Status:  DNR Goals of care: Advanced Directive information Advanced Directives 04/11/2020  Does Patient Have a Medical Advance Directive? Yes  Type of Paramedic of [place];Out of facility DNR (pink MOST or yellow form);Living will  Does patient want to make changes to medical advance directive? No - Patient declined  Copy of [place] in Chart? Yes - validated most recent copy scanned in chart (See row information)  Would patient like information on creating a medical advance directive? -  Pre-existing out of facility DNR order (yellow form or pink MOST form) Pink MOST/Yellow Form most recent copy in chart - Physician notified to receive inpatient order     Chief Complaint  Patient presents with  . Acute Visit    leg swelling and questions about dvt    HPI:  Pt is a 79 y.o. female seen today for an acute visit for leg swelling and questions about her DVT.  She was sent to the ER on 8/8 for leg swelling and diagnosed with a DVT involving the right external iliac vein, common femoral vein, right femoral vein, SF junction, right proximal profunda vein, right tibial vein, and right peroneal vein. She did not have any shortness of breath and was placed on a Xarelto starter pack and discharged back to assisted living. She is concerned that she still has edema and leg heaviness after taking the med for 4 days. She is not having any chest pain, leg pain, sob, venous ulcers, or other concerning symptoms. She does not like the hose she has because they are too small. She is trying to keep her leg elevated and exercise is being encouraged.    Past Medical History:  Diagnosis Date  . Anxiety   . Arthritis   . Cerebral aneurysm    s/p endovascular colling 2008  . CLL (chronic lymphocytic leukemia) (HCC)    CLL  . Depression   . Hearing loss   . Macular degeneration   . Neuropathy   . Paroxysmal atrial fibrillation (HCC)   . Pneumonia    08-04-17  . PONV (postoperative nausea and vomiting)   . Thyroid disease    Past Surgical History:  Procedure Laterality Date  . ANEURYSM COILING    . CATARACT EXTRACTION     bilateral  . CERVICAL CONE BIOPSY    . COLONOSCOPY    . EVALUATION UNDER ANESTHESIA WITH HEMORRHOIDECTOMY N/A 09/03/2017   Procedure: EXAM UNDER ANESTHESIA, EXCISION OF ANAL LESION, POSSIBLE HEMORRHOIDECTOMY;  Surgeon: Ileana Roup, MD;  Location: [facility];  Service: General;  Laterality: N/A;  . LESION REMOVAL N/A 07/22/2018   Procedure: EXCISION OF PERIANAL LESION;  Surgeon: Dema Severin,  Sharon Mt, MD;  Location: [place];  Service: General;  Laterality: N/A;  . LUMBAR LAMINECTOMY/DECOMPRESSION MICRODISCECTOMY Right 04/07/2018   Procedure: Right Lumbar Four-Five Lumbar Five-Sacral One Laminectomy/Microdiscectomy;  Surgeon: Kristeen Miss, MD;  Location: [place];  Service: Neurosurgery;  Laterality: Right;  Right Lumbar Four-Five Lumbar Five-Sacral  One Laminectomy/Microdiscectomy  . RECTAL EXAM UNDER ANESTHESIA N/A 07/22/2018   Procedure: ANORECTAL EXAM UNDER ANESTHESIA ERAS PATHWAY;  Surgeon: Ileana Roup, MD;  Location: [place];  Service: General;  Laterality: N/A;  . THYROIDECTOMY    . TONSILLECTOMY      Allergies  Allergen Reactions  . Ambien [Zolpidem Tartrate] Other (See Comments)    UNSPECIFIED REACTION   . Fioricet-Codeine [Butalbital-Apap-Caff-Cod] Other (See Comments)    UNSPECIFIED REACTION   . Macrobid [Nitrofurantoin] Other (See Comments)    UNSPECIFIED REACTION   . Codeine Other (See Comments)    About 50 years ago, took Codeine for scratched cornea while pregnant, and it made her extremely sedated.  . Hydrocodone-Acetaminophen Nausea And Vomiting  . Keflex [Cephalexin] Rash  . Percocet [Oxycodone-Acetaminophen] Nausea And Vomiting    Outpatient Encounter Medications as of 04/14/2020  Medication Sig  . acetaminophen (TYLENOL) 500 MG tablet Take 500 mg by mouth See admin instructions. 500mg  twice a day scheduled and may take 500mg  twice a day as needed for pain twice daily.  Marland Kitchen albuterol (ACCUNEB) 0.63 MG/3ML nebulizer solution Take 1 ampule by nebulization every 6 (six) hours as needed for wheezing.  Marland Kitchen buPROPion (WELLBUTRIN XL) 150 MG 24 hr tablet Take 150 mg by mouth daily. Take with the 300mg  tablet for total 450mg .  . buPROPion (WELLBUTRIN XL) 300 MG 24 hr tablet Take 450 mg by mouth daily. Take with the 150mg  tablet for total 450mg .  . COMBIGAN 0.2-0.5 % ophthalmic solution Place 1 drop into the left eye 2 (two) times daily.   Marland Kitchen dextromethorphan-guaiFENesin (ROBAFEN DM CGH/CHEST CONGEST) 10-100 MG/5ML liquid Take 10 mLs by mouth every 4 (four) hours as needed for cough.  . diltiazem (CARDIZEM CD) 180 MG 24 hr capsule Take 180 mg by mouth daily.  . Diphenhyd-Hydrocort-Nystatin (FIRST-DUKES MOUTHWASH MT) Use as directed 5 mLs in the mouth or throat 4 (four) times daily as needed (mouth discomfort). Swish and  spit.  Marland Kitchen donepezil (ARICEPT) 5 MG tablet Take 5 mg by mouth at bedtime.  . dorzolamide (TRUSOPT) 2 % ophthalmic solution Place 1 drop into the left eye 3 (three) times daily.   Marland Kitchen estradiol (ESTRACE) 0.1 MG/GM vaginal cream Place 1 Applicatorful vaginally See admin instructions. Administer 3 times a week on Monday, Thursday, and Saturday.  . estrogen, conjugated,-medroxyprogesterone (PREMPRO) 0.625-2.5 MG tablet Take 1 tablet by mouth daily.  . Eyelid Cleansers (OCUSOFT EYELID CLEANSING) PADS Apply 1 each topically daily as needed (discharge in eyes).  . gabapentin (NEURONTIN) 100 MG capsule Take 100 mg by mouth 2 (two) times daily.  . hydrocortisone (ANUSOL-HC) 25 MG suppository Place 25 mg rectally 2 (two) times daily as needed for hemorrhoids or anal itching.  . lamoTRIgine (LAMICTAL) 150 MG tablet Take 1 tablet (150 mg total) by mouth daily. (Patient taking differently: Take 150 mg by mouth daily. Increase lamictal to 150 mg qhs for bipolar depression)  . latanoprost (XALATAN) 0.005 % ophthalmic solution PLACE 1 DROP INTO THE LEFT EYE AT BEDTIME.  Marland Kitchen Lidocaine (ASPERCREME LIDOCAINE) 4 % PTCH Apply 1 patch topically daily as needed (for lower back / right hip pain).   Marland Kitchen lidocaine (XYLOCAINE) 2 % jelly Apply 1  application topically every 8 (eight) hours as needed (to vaginal area for catheter discomfort).   Marland Kitchen loperamide (IMODIUM A-D) 2 MG tablet Take 4 mg by mouth daily as needed for diarrhea or loose stools.  Marland Kitchen LORazepam (ATIVAN) 0.5 MG tablet Take 0.5 tablets (0.25 mg total) by mouth daily as needed for anxiety.  Marland Kitchen LORazepam (ATIVAN) 0.5 MG tablet Take 1 tablet (0.5 mg total) by mouth 2 (two) times daily.  . meclizine (ANTIVERT) 25 MG tablet Take 25 mg by mouth 2 (two) times daily as needed for dizziness.  . memantine (NAMENDA) 10 MG tablet Take 1 tablet (10 mg total) by mouth at bedtime.  . ondansetron (ZOFRAN-ODT) 8 MG disintegrating tablet Take 8 mg by mouth 2 (two) times daily as needed for  nausea or vomiting.  . phenylephrine-shark liver oil-mineral oil-petrolatum (PREPARATION H) 0.25-14-74.9 % rectal ointment Place 1 application rectally 4 (four) times daily as needed for hemorrhoids.  . polyethylene glycol (MIRALAX / GLYCOLAX) packet Take 17 g by mouth daily as needed for moderate constipation.  . Probiotic Product (ALIGN) 4 MG CAPS Take 4 mg by mouth daily.   . risperiDONE (RISPERDAL) 0.25 MG tablet Take 0.25 mg by mouth 2 (two) times daily.  Marland Kitchen RIVAROXABAN (XARELTO) VTE STARTER PACK (15 & 20 MG TABLETS) Follow package directions: Take one 15mg  tablet by mouth twice a day. On day 22, switch to one 20mg  tablet once a day. Take with food.  . sennosides-docusate sodium (SENOKOT-S) 8.6-50 MG tablet Take 1 tablet by mouth in the morning and at bedtime. Hold for loose stools or diarrhea.  . Wheat Dextrin (BENEFIBER PO) Take 15 mLs by mouth 2 (two) times daily as needed (constipation). Stir 1 tablespoon in 4-8 oz of non- carbonated beverage until dissolved.   No facility-administered encounter medications on file as of 04/14/2020.    Review of Systems  Constitutional: Negative for activity change, appetite change, chills, diaphoresis, fatigue, fever and unexpected weight change.  HENT: Negative for congestion.   Respiratory: Negative for cough, shortness of breath and wheezing.   Cardiovascular: Positive for leg swelling. Negative for chest pain and palpitations.  Gastrointestinal: Negative for abdominal distention, abdominal pain, constipation and diarrhea.  Genitourinary: Negative for difficulty urinating and dysuria.  Musculoskeletal: Positive for gait problem. Negative for arthralgias, back pain, joint swelling and myalgias.  Neurological: Negative for dizziness, tremors, seizures, syncope, facial asymmetry, speech difficulty, weakness, light-headedness, numbness (chronic neuropathy) and headaches.  Psychiatric/Behavioral: Positive for confusion. Negative for agitation and  behavioral problems. The patient is nervous/anxious.     Immunization History  Administered Date(s) Administered  . Influenza, High Dose Seasonal PF 06/19/2018  . Influenza,inj,Quad PF,6+ Mos 07/01/2019  . Influenza-Unspecified 06/27/2016  . Moderna SARS-COVID-2 Vaccination 09/24/2019, 10/12/2019  . Pneumococcal Conjugate-13 07/21/2013, 04/15/2016  . Pneumococcal Polysaccharide-23 03/14/2010  . Td 03/09/2010  . Tdap 12/19/2016  . Zoster 08/02/2005, 03/14/2010  . Zoster Recombinat (Shingrix) 06/18/2017, 10/22/2017   Pertinent  Health Maintenance Due  Topic Date Due  . HEMOGLOBIN A1C  12/10/2019  . FOOT EXAM  12/21/2019  . OPHTHALMOLOGY EXAM  03/24/2020  . INFLUENZA VACCINE  04/02/2020  . URINE MICROALBUMIN  06/09/2020  . DEXA SCAN  Completed  . PNA vac Low Risk Adult  Completed   Fall Risk  02/09/2020 11/17/2019 10/06/2019 06/09/2019 05/05/2019  Falls in the past year? 0 0 0 1 0  Number falls in past yr: 0 0 0 0 0  Injury with Fall? 0 0 0 0 0  Follow up - - - - -  Functional Status Survey:    Vitals:   04/14/20 1209  BP: 140/80  Pulse: 78  Resp: 16  Temp: (!) 97.3 F (36.3 C)  SpO2: 93%   There is no height or weight on file to calculate BMI. Physical Exam Vitals and nursing note reviewed.  Constitutional:      General: She is not in acute distress.    Appearance: She is not diaphoretic.  HENT:     Head: Normocephalic and atraumatic.  Neck:     Vascular: No JVD.  Cardiovascular:     Rate and Rhythm: Normal rate. Rhythm irregular.     Pulses:          Dorsalis pedis pulses are 2+ on the right side and 2+ on the left side.       Posterior tibial pulses are 2+ on the right side and 2+ on the left side.     Heart sounds: No murmur heard.   Pulmonary:     Effort: Pulmonary effort is normal. No respiratory distress.     Breath sounds: Normal breath sounds. No wheezing.  Abdominal:     General: Bowel sounds are normal. There is no distension.     Palpations: Abdomen  is soft.  Musculoskeletal:     Right lower leg: Edema (+2 extending from the right foot to the right thigh with mild erythema and varicose veins noted. No venous ulcers are noted.  No warmth or tenderness noted. ) present.     Left lower leg: No edema.  Skin:    General: Skin is warm and dry.  Neurological:     Mental Status: She is alert.     Comments: Oriented x 2      Labs reviewed: Recent Labs    04/09/20 2359  NA 140  K 4.3  CL 108  CO2 23  GLUCOSE 94  BUN 11  CREATININE 0.69  CALCIUM 8.6*   Recent Labs    04/09/20 2359  AST 15  ALT 9  ALKPHOS 51  BILITOT 0.5  PROT 6.3*  ALBUMIN 3.3*   Recent Labs    04/09/20 2359  WBC 11.9*  NEUTROABS 3.8  HGB 12.4  HCT 37.1  MCV 98.4  PLT 125*   Lab Results  Component Value Date   TSH 1.78 11/11/2017   Lab Results  Component Value Date   HGBA1C 5.4 06/11/2019   Lab Results  Component Value Date   CHOL 146 11/11/2017   HDL 42 11/11/2017   LDLCALC 63 11/11/2017   TRIG 207 (A) 11/11/2017    Significant Diagnostic Results in last 30 days:  VAS [place] LOWER EXTREMITY VENOUS (DVT) (ONLY [facility])  Result Date: 04/10/2020  Lower Venous DVTStudy Indications: Edema, and Pain.  Risk Factors: None identified. Limitations: Body habitus, poor ultrasound/tissue interface and patient pain tolerance. Comparison Study: No prior studies. Performing Technologist: Oliver Hum RVT  Examination Guidelines: A complete evaluation includes B-mode imaging, spectral Doppler, color Doppler, and power Doppler as needed of all accessible portions of each vessel. Bilateral testing is considered an integral part of a complete examination. Limited examinations for reoccurring indications may be performed as noted. The reflux portion of the exam is performed with the patient in reverse Trendelenburg.  +---------+---------------+---------+-----------+----------+--------------+ RIGHT    CompressibilityPhasicitySpontaneityPropertiesThrombus  Aging +---------+---------------+---------+-----------+----------+--------------+ CFV      None           No       No  Acute          +---------+---------------+---------+-----------+----------+--------------+ SFJ      None                                         Acute          +---------+---------------+---------+-----------+----------+--------------+ FV Prox  None           No       No                   Acute          +---------+---------------+---------+-----------+----------+--------------+ FV Mid   None           No       No                   Acute          +---------+---------------+---------+-----------+----------+--------------+ FV DistalNone           No       No                   Acute          +---------+---------------+---------+-----------+----------+--------------+ PFV      None                                         Acute          +---------+---------------+---------+-----------+----------+--------------+ POP      None           No       No                   Acute          +---------+---------------+---------+-----------+----------+--------------+ PTV      Partial                                      Acute          +---------+---------------+---------+-----------+----------+--------------+ PERO     None                                         Acute          +---------+---------------+---------+-----------+----------+--------------+ EIV      None           No       No                   Acute          +---------+---------------+---------+-----------+----------+--------------+ CIV      Full           Yes      Yes                                 +---------+---------------+---------+-----------+----------+--------------+   +---------+---------------+---------+-----------+----------+--------------+ LEFT     CompressibilityPhasicitySpontaneityPropertiesThrombus Aging  +---------+---------------+---------+-----------+----------+--------------+ CFV      Full           Yes      Yes                                 +---------+---------------+---------+-----------+----------+--------------+  SFJ      Full                                                        +---------+---------------+---------+-----------+----------+--------------+ FV Prox  Full                                                        +---------+---------------+---------+-----------+----------+--------------+ FV Mid   Full                                                        +---------+---------------+---------+-----------+----------+--------------+ FV DistalFull                                                        +---------+---------------+---------+-----------+----------+--------------+ PFV      Full                                                        +---------+---------------+---------+-----------+----------+--------------+ POP      Full           Yes      Yes                                 +---------+---------------+---------+-----------+----------+--------------+ PTV      Full                                                        +---------+---------------+---------+-----------+----------+--------------+ PERO     Full                                                        +---------+---------------+---------+-----------+----------+--------------+     Summary: RIGHT: - Findings consistent with acute deep vein thrombosis involving the right external iliac vein, common femoral vein, right femoral vein, SF junction, right proximal profunda vein, right popliteal vein, right posterior tibial veins, and right peroneal veins. - No cystic structure found in the popliteal fossa.  LEFT: - There is no evidence of deep vein thrombosis in the lower extremity.  - No cystic structure found in the popliteal fossa.  *See table(s) above for measurements and  observations. Electronically signed by Ruta Hinds MD on 04/10/2020 at 5:16:23 PM.    Final     Assessment/Plan 1. Acute deep vein thrombosis (DVT) of  iliac vein of right lower extremity (HCC) She should continue the xarelto dose starter pack at 15 mg bid for 21 days then 20 mg qd started on 8/9.  I counseled Ms. Melendrez and her caregiver on the fact that this was an extensive clot and will take time to dissolve. During that time she will experience edema and leg heaviness which should get better over time but may possibly never fully return to normal. She does not have any warning signs of vascular compromise or respiratory complications.   2. Localized edema I recommend a larger compression hose 20-30 mm Hg. Until this comes in the nurse should wrap the right leg with an ace wrap from her toes to her thigh area and monitor for normal circulation. Also monitor for venous ulcers. Keep leg elevated unless ambulating.  3. Age-related osteoporosis without current pathological fracture Would recommend discontinuing the prempro due to her recent DVT. Will discuss with Dr. Mariea Clonts    Family/ staff Communication: discussed with Ms. Domingo Cocking and her nurse [name]   Labs/tests ordered:  NA

## 2020-04-15 MED ORDER — LAMOTRIGINE 150 MG PO TABS: 150.0000 mg | ORAL_TABLET | Freq: Every day | ORAL | 5 refills | Status: DC

## 2020-05-16 ENCOUNTER — Telehealth: Payer: Self-pay | Admitting: Adult Health

## 2020-05-16 NOTE — Telephone Encounter (Signed)
Pt continues to have swelling and adenopathy in the right leg. Oncall over the weekend ordered follow up ultrasound. F/U US of the RLE showed an acute DVT of the right common femoral, deep femoral, right popliteal veins. Also mixed echogenic structure measuring 4.5 x 3.4 x 3.6 in the right groin region concern of lymphadenopathy vs soft tissue mass. She has been on xarelto since 8/9 for the above clot which has not improved thus far.  Called and discussed her case with Dr. Lyndel Safe with [facility], as well as Dr. Eulas Post (follows her under hospice care).  Bethany Oconnor has a hx of rectal cancer that she decided to no longer treat or follow up on. This is concerning that there is adenopathy. Her prior ca diagnosis may prevent resolution of the DVT as it is considered to be unprovoked .  We will re order another ultrasound in 6 weeks and if no improvement could consider lovenox/coumadin therapy. Hospice will not cover lovenox and given her goals of care additional testing and treatment may not be warranted.

## 2020-05-18 ENCOUNTER — Encounter: Payer: Self-pay | Admitting: Adult Health

## 2020-05-18 ENCOUNTER — Telehealth: Payer: Self-pay | Admitting: Adult Health

## 2020-05-18 ENCOUNTER — Non-Acute Institutional Stay: Payer: Medicare Other | Admitting: Adult Health

## 2020-05-18 ENCOUNTER — Other Ambulatory Visit: Payer: Self-pay

## 2020-05-18 ENCOUNTER — Emergency Department (HOSPITAL_COMMUNITY)
Admission: EM | Admit: 2020-05-18 | Discharge: 2020-05-19 | Disposition: A | Discharge status: Home / Self Care | Attending: Emergency Medicine | Admitting: Emergency Medicine

## 2020-05-18 ENCOUNTER — Encounter (HOSPITAL_COMMUNITY): Payer: Self-pay

## 2020-05-18 ENCOUNTER — Emergency Department (HOSPITAL_COMMUNITY)

## 2020-05-18 DIAGNOSIS — I82491 Acute embolism and thrombosis of other specified deep vein of right lower extremity: Secondary | ICD-10-CM

## 2020-05-18 DIAGNOSIS — Z87891 Personal history of nicotine dependence: Secondary | ICD-10-CM | POA: Diagnosis not present

## 2020-05-18 DIAGNOSIS — N183 Chronic kidney disease, stage 3 unspecified: Secondary | ICD-10-CM | POA: Diagnosis not present

## 2020-05-18 DIAGNOSIS — R002 Palpitations: Secondary | ICD-10-CM | POA: Diagnosis not present

## 2020-05-18 DIAGNOSIS — R42 Dizziness and giddiness: Secondary | ICD-10-CM | POA: Diagnosis present

## 2020-05-18 DIAGNOSIS — Z7901 Long term (current) use of anticoagulants: Secondary | ICD-10-CM | POA: Insufficient documentation

## 2020-05-18 DIAGNOSIS — R599 Enlarged lymph nodes, unspecified: Secondary | ICD-10-CM

## 2020-05-18 DIAGNOSIS — Z79899 Other long term (current) drug therapy: Secondary | ICD-10-CM | POA: Insufficient documentation

## 2020-05-18 DIAGNOSIS — R591 Generalized enlarged lymph nodes: Secondary | ICD-10-CM | POA: Diagnosis not present

## 2020-05-18 DIAGNOSIS — R531 Weakness: Secondary | ICD-10-CM | POA: Insufficient documentation

## 2020-05-18 DIAGNOSIS — I129 Hypertensive chronic kidney disease with stage 1 through stage 4 chronic kidney disease, or unspecified chronic kidney disease: Secondary | ICD-10-CM | POA: Insufficient documentation

## 2020-05-18 DIAGNOSIS — E1142 Type 2 diabetes mellitus with diabetic polyneuropathy: Secondary | ICD-10-CM | POA: Insufficient documentation

## 2020-05-18 DIAGNOSIS — I82409 Acute embolism and thrombosis of unspecified deep veins of unspecified lower extremity: Secondary | ICD-10-CM | POA: Insufficient documentation

## 2020-05-18 LAB — URINALYSIS, COMPLETE (UACMP) WITH MICROSCOPIC
Bilirubin Urine: NEGATIVE
Glucose, UA: NEGATIVE mg/dL
Ketones, ur: NEGATIVE mg/dL
Nitrite: NEGATIVE
Protein, ur: NEGATIVE mg/dL
Specific Gravity, Urine: 1.012 (ref 1.005–1.030)
pH: 7 (ref 5.0–8.0)

## 2020-05-18 LAB — CBC WITH DIFFERENTIAL/PLATELET
Abs Immature Granulocytes: 0.04 10*3/uL (ref 0.00–0.07)
Basophils Absolute: 0 10*3/uL (ref 0.0–0.1)
Basophils Relative: 0 %
Eosinophils Absolute: 0.1 10*3/uL (ref 0.0–0.5)
Eosinophils Relative: 1 %
HCT: 39.7 % (ref 36.0–46.0)
Hemoglobin: 12.8 g/dL (ref 12.0–15.0)
Immature Granulocytes: 0 %
Lymphocytes Relative: 65 %
Lymphs Abs: 7.5 10*3/uL — ABNORMAL HIGH (ref 0.7–4.0)
MCH: 32.3 pg (ref 26.0–34.0)
MCHC: 32.2 g/dL (ref 30.0–36.0)
MCV: 100.3 fL — ABNORMAL HIGH (ref 80.0–100.0)
Monocytes Absolute: 0.6 10*3/uL (ref 0.1–1.0)
Monocytes Relative: 5 %
Neutro Abs: 3.3 10*3/uL (ref 1.7–7.7)
Neutrophils Relative %: 29 %
Platelets: 166 10*3/uL (ref 150–400)
RBC: 3.96 MIL/uL (ref 3.87–5.11)
RDW: 13.6 % (ref 11.5–15.5)
WBC: 11.6 10*3/uL — ABNORMAL HIGH (ref 4.0–10.5)
nRBC: 0 % (ref 0.0–0.2)

## 2020-05-18 LAB — COMPREHENSIVE METABOLIC PANEL
ALT: 12 U/L (ref 0–44)
AST: 21 U/L (ref 15–41)
Albumin: 3.5 g/dL (ref 3.5–5.0)
Alkaline Phosphatase: 50 U/L (ref 38–126)
Anion gap: 11 (ref 5–15)
BUN: 10 mg/dL (ref 8–23)
CO2: 22 mmol/L (ref 22–32)
Calcium: 8.9 mg/dL (ref 8.9–10.3)
Chloride: 107 mmol/L (ref 98–111)
Creatinine, Ser: 0.77 mg/dL (ref 0.44–1.00)
GFR calc Af Amer: >60 mL/min (ref >60)
GFR calc non Af Amer: >60 mL/min (ref >60)
Glucose, Bld: 88 mg/dL (ref 70–99)
Potassium: 4.3 mmol/L (ref 3.5–5.1)
Sodium: 140 mmol/L (ref 135–145)
Total Bilirubin: 0.5 mg/dL (ref 0.3–1.2)
Total Protein: 5.8 g/dL — ABNORMAL LOW (ref 6.5–8.1)

## 2020-05-18 LAB — I-STAT CHEM 8, ED
BUN: 13 mg/dL (ref 8–23)
Calcium, Ion: 1.13 mmol/L — ABNORMAL LOW (ref 1.15–1.40)
Chloride: 106 mmol/L (ref 98–111)
Creatinine, Ser: 0.7 mg/dL (ref 0.44–1.00)
Glucose, Bld: 83 mg/dL (ref 70–99)
HCT: 39 % (ref 36.0–46.0)
Hemoglobin: 13.3 g/dL (ref 12.0–15.0)
Potassium: 4.2 mmol/L (ref 3.5–5.1)
Sodium: 142 mmol/L (ref 135–145)
TCO2: 23 mmol/L (ref 22–32)

## 2020-05-18 LAB — TROPONIN I (HIGH SENSITIVITY)
Troponin I (High Sensitivity): 4 ng/L (ref <18)
Troponin I (High Sensitivity): 4 ng/L (ref <18)

## 2020-05-18 LAB — MAGNESIUM: Magnesium: 2 mg/dL (ref 1.7–2.4)

## 2020-05-18 MED ORDER — IOHEXOL 350 MG/ML SOLN
100.0000 mL | Freq: Once | INTRAVENOUS | Status: AC | PRN
  Administered 2020-05-18: 100 mL via INTRAVENOUS

## 2020-05-18 NOTE — ED Triage Notes (Signed)
Pt sent from [facility] for weakness & dizziness starting around 1600- uses walker but felt weak going to restroom. Pt dx w DVT in rt leg 2 days ago. Pt denies pain, loc. Pt legally blind

## 2020-05-18 NOTE — ED Provider Notes (Signed)
[facility] EMERGENCY DEPARTMENT Provider Note   CSN: [id number] Arrival date & time: 05/18/20  2019     History Chief Complaint  Patient presents with  . Weakness    Bethany Oconnor is a 79 y.o. female with past medical history of anxiety, CLL, paroxysmal A. fib, Lewy body dementia, thyroid disease, anal cancer for which she is on hospice as well as recent admission for right lower extremity DVT presents the ED from nursing home due to concerns for an episode of dizziness, palpitations began around 5 hours prior to presentation.  She states this lasted 1 to 2 hours and has gradually subsided.  She states that she feels back to baseline at this time, denies chest pain per se or shortness of breath or fevers or chills.  No attempted interventions.  Reports compliance with anticoagulants.  The history is provided by the patient.  Illness Quality:  Dizziness, palpitations Severity:  Moderate Onset quality:  Sudden Duration:  2 hours Timing:  Constant Progression:  Resolved Chronicity:  New Context:  No DVT Associated symptoms: fatigue   Associated symptoms: no abdominal pain, no chest pain, no cough, no fever, no headaches, no rash, no shortness of breath and no vomiting        Past Medical History:  Diagnosis Date  . Anxiety   . Arthritis   . Cerebral aneurysm    s/p endovascular colling 2008  . CLL (chronic lymphocytic leukemia) (HCC)    CLL  . Depression   . Hearing loss   . Macular degeneration   . Neuropathy   . Paroxysmal atrial fibrillation (HCC)   . Pneumonia    08-04-17  . PONV (postoperative nausea and vomiting)   . Thyroid disease     Patient Active Problem List   Diagnosis Date Noted  . Deep venous thrombosis ([place]) 05/18/2020  . Diabetic peripheral neuropathy associated with type 2 diabetes mellitus ([place]) 11/17/2019  . OAB (overactive bladder) 01/14/2019  . DNR (do not resuscitate)   . Lewy body dementia without behavioral  disturbance ([place])   . Depression, recurrent ([place]) 10/19/2018  . Grief reaction with prolonged bereavement 10/19/2018  . Anal cancer ([place]) 08/03/2018  . Low vision, both eyes 04/14/2018  . S/P lumbar discectomy 04/14/2018  . Spinal stenosis of lumbar region with neurogenic claudication 04/14/2018  . Hemorrhoids 04/14/2018  . Hypercholesterolemia 04/14/2018  . 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 Presbycusis of both ears 04/14/2018  . PAF (paroxysmal atrial fibrillation) ([place])   . Macular degeneration   . Chronic low back pain   . Herniated nucleus pulposus of lumbosacral region 04/07/2018  . Cerebral aneurysm   . Dementia with behavioral disturbance ([place]) 12/20/2016  . CLL (chronic lymphocytic leukemia) ([place]) 09/14/2014  . 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 HYPERTENSION, BENIGN 05/31/2010  . ATRIAL FIBRILLATION 05/31/2010    Past Surgical History:  Procedure Laterality Date  . ANEURYSM COILING    . CATARACT EXTRACTION     bilateral  . CERVICAL CONE BIOPSY    . COLONOSCOPY    . EVALUATION UNDER ANESTHESIA WITH HEMORRHOIDECTOMY N/A 09/03/2017   Procedure: EXAM UNDER ANESTHESIA, EXCISION OF ANAL LESION, POSSIBLE HEMORRHOIDECTOMY;  Surgeon: Ileana Roup, MD;  Location: [facility];  Service: General;  Laterality: N/A;  . LESION REMOVAL N/A 07/22/2018   Procedure: EXCISION OF PERIANAL LESION;  Surgeon: Ileana Roup, MD;  Location: [place];  Service: General;  Laterality: N/A;  . LUMBAR LAMINECTOMY/DECOMPRESSION MICRODISCECTOMY Right 04/07/2018   Procedure: Right Lumbar Four-Five Lumbar Five-Sacral One Laminectomy/Microdiscectomy;  Surgeon: Kristeen Miss, MD;  Location: [place];  Service: Neurosurgery;  Laterality: Right;  Right Lumbar Four-Five Lumbar Five-Sacral One Laminectomy/Microdiscectomy  . RECTAL EXAM UNDER ANESTHESIA N/A 07/22/2018   Procedure: ANORECTAL EXAM UNDER ANESTHESIA  ERAS PATHWAY;  Surgeon: Ileana Roup, MD;  Location: [place];  Service: General;  Laterality: N/A;  . THYROIDECTOMY    . TONSILLECTOMY       OB History   No obstetric history on file.     Family History  Problem Relation Age of Onset  . Cancer Father 44       died  . Cerebral aneurysm Mother 76       died  . Heart attack Brother     Social History   Tobacco Use  . Smoking status: Former Smoker    Quit date: 09/02/1988    Years since quitting: 31.7  . Smokeless tobacco: Never Used  Vaping Use  . Vaping Use: Never used  Substance Use Topics  . Alcohol use: No    Alcohol/week: 1.0 - 2.0 standard drink    Types: 1 - 2 Glasses of wine per week    Comment: daily for 50 years  No longer drinks 09-01-17  . Drug use: No    Home Medications Prior to Admission medications   Medication Sig Start Date End Date Taking? Authorizing Provider  acetaminophen (TYLENOL) 500 MG tablet Take 500 mg by mouth See admin instructions. 500mg  twice a day scheduled and may take 500mg  twice a day as needed for pain twice daily.    [provider]  albuterol (ACCUNEB) 0.63 MG/3ML nebulizer solution Take 1 ampule by nebulization every 6 (six) hours as needed for wheezing.    [provider]  buPROPion (WELLBUTRIN XL) 150 MG 24 hr tablet Take 150 mg by mouth daily. Take with the 300mg  tablet for total 450mg . 10/01/18   [provider]  buPROPion (WELLBUTRIN XL) 300 MG 24 hr tablet Take 450 mg by mouth daily. Take with the 150mg  tablet for total 450mg . 02/11/18   [provider]  COMBIGAN 0.2-0.5 % ophthalmic solution Place 1 drop into the left eye 2 (two) times daily.  09/12/17   [provider]  dextromethorphan-guaiFENesin (ROBAFEN DM CGH/CHEST CONGEST) 10-100 MG/5ML liquid Take 10 mLs by mouth every 4 (four) hours as needed for cough.    [provider]  diltiazem (CARDIZEM CD) 180 MG 24 hr capsule Take 180 mg by mouth daily.    [provider]  Diphenhyd-Hydrocort-Nystatin (FIRST-DUKES MOUTHWASH MT) Use as directed 5 mLs in the mouth or throat 4 (four) times daily as needed (mouth discomfort). Swish and spit.    [provider]  donepezil (ARICEPT) 5 MG tablet Take 5 mg by mouth at bedtime.    [provider]  dorzolamide (TRUSOPT) 2 % ophthalmic solution Place 1 drop into the left eye 3 (three) times daily.  10/07/18   [provider]  estradiol (ESTRACE) 0.1 MG/GM vaginal cream Place 1 Applicatorful vaginally See admin instructions. Administer 3 times a week on Monday, Thursday, and Saturday.    [provider]  estrogen, conjugated,-medroxyprogesterone (PREMPRO) 0.625-2.5 MG tablet Take 1 tablet by mouth daily.    [provider]  Eyelid Cleansers (OCUSOFT EYELID CLEANSING) PADS Apply 1 each topically daily as needed (discharge in eyes).    [provider]  gabapentin (NEURONTIN) 100 MG capsule Take 100 mg by mouth 2 (two) times daily.    [provider]  hydrocortisone (ANUSOL-HC) 25 MG suppository Place 25 mg rectally 2 (two) times daily as needed for hemorrhoids or anal itching.    [provider]  lamoTRIgine (LAMICTAL) 150 MG tablet Take 1 tablet (150 mg total) by mouth at bedtime. 04/15/20   Reed, Tiffany L, DO  latanoprost (XALATAN) 0.005 % ophthalmic solution PLACE 1 DROP INTO THE LEFT EYE AT BEDTIME. 02/07/20   Reed, Tiffany L, DO  Lidocaine (ASPERCREME LIDOCAINE) 4 % PTCH Apply 1 patch topically daily as needed (for lower back / right hip pain).     [provider]  lidocaine (XYLOCAINE) 2 % jelly Apply 1 application topically every 8 (eight) hours as needed (to vaginal area for catheter discomfort).     [provider]  loperamide (IMODIUM A-D) 2 MG tablet Take 4 mg by mouth daily as needed for diarrhea or loose stools.    [provider]  LORazepam (ATIVAN) 0.5 MG tablet Take 0.5 tablets (0.25 mg total) by mouth daily  as needed for anxiety. 10/06/19   Reed, Tiffany L, DO  LORazepam (ATIVAN) 0.5 MG tablet Take 1 tablet (0.5 mg total) by mouth 2 (two) times daily. 02/01/20   Reed, Tiffany L, DO  meclizine (ANTIVERT) 25 MG tablet Take 25 mg by mouth 2 (two) times daily as needed for dizziness.    [provider]  memantine (NAMENDA) 10 MG tablet Take 1 tablet (10 mg total) by mouth at bedtime. 02/09/20   Reed, Tiffany L, DO  ondansetron (ZOFRAN-ODT) 8 MG disintegrating tablet Take 8 mg by mouth 2 (two) times daily as needed for nausea or vomiting.    [provider]  phenylephrine-shark liver oil-mineral oil-petrolatum (PREPARATION H) 0.25-14-74.9 % rectal ointment Place 1 application rectally 4 (four) times daily as needed for hemorrhoids.    [provider]  polyethylene glycol (MIRALAX / GLYCOLAX) packet Take 17 g by mouth daily as needed for moderate constipation.    [provider]  Probiotic Product (ALIGN) 4 MG CAPS Take 4 mg by mouth daily.     [provider]  risperiDONE (RISPERDAL) 0.25 MG tablet Take 0.25 mg by mouth 2 (two) times daily.    [provider]  RIVAROXABAN Alveda Reasons) VTE STARTER PACK (15 & 20 MG TABLETS) Follow package directions: Take one 15mg  tablet by mouth twice a day. On day 22, switch to one 20mg  tablet once a day. Take with food. 04/10/20   Dorie Rank, MD  sennosides-docusate sodium (SENOKOT-S) 8.6-50 MG tablet Take 1 tablet by mouth in the morning and at bedtime. Hold for loose stools or diarrhea.    [provider]  Wheat Dextrin (BENEFIBER PO) Take 15 mLs by mouth 2 (two) times daily as needed (constipation). Stir 1 tablespoon in 4-8 oz of non- carbonated beverage until dissolved.    [provider]    Allergies    Ambien [zolpidem tartrate], Fioricet-codeine [butalbital-apap-caff-cod], Macrobid [nitrofurantoin], Codeine, Hydrocodone-acetaminophen, Keflex [cephalexin], and Percocet [oxycodone-acetaminophen]  Review of  Systems   Review of Systems  Constitutional: Positive for fatigue. Negative for chills and fever.  HENT: Negative for facial swelling and voice change.   Eyes: Negative for redness and visual disturbance.  Respiratory: Negative for cough and shortness of breath.   Cardiovascular: Positive for palpitations. Negative for chest pain.  Gastrointestinal: Negative for abdominal pain and vomiting.  Genitourinary: Negative for difficulty urinating and dysuria.  Musculoskeletal: Negative for gait problem and joint swelling.       Positive right leg pain and swelling.  Skin: Negative for rash and wound.  Neurological: Positive for dizziness and weakness. Negative for headaches.  Psychiatric/Behavioral: Negative for confusion and suicidal ideas.    Physical Exam Updated Vital Signs BP (!) 144/72 (BP Location: Left Arm)   Pulse 67   Temp 98.1 F (36.7 C) (Oral)   Resp 18   Ht 5\' 5"  (1.651 m)   Wt 77.6 kg   SpO2 96%   BMI 28.46 kg/m   Physical Exam Constitutional:      General: She is not in acute distress.    Comments: Chronically ill-appearing  HENT:     Head: Normocephalic and atraumatic.     Mouth/Throat:     Mouth: Mucous membranes are moist.     Pharynx: Oropharynx is clear.  Eyes:     General: No scleral icterus.    Pupils: Pupils are equal, round, and reactive to light.  Cardiovascular:     Rate and Rhythm: Normal rate and regular rhythm.     Pulses: Normal pulses.  Pulmonary:     Effort: Pulmonary effort is normal. No respiratory distress.  Abdominal:     General: There is no distension.     Tenderness: There is abdominal tenderness.     Comments: Lower abdominal tenderness, baseline per patient  Musculoskeletal:        General: No tenderness or deformity.     Cervical back: Normal range of motion and neck supple.     Comments: Diffuse right lower extremity swelling with mild pitting edema  Neurological:     General: No focal deficit present.     Mental Status: She  is alert and oriented to person, place, and time.  Psychiatric:        Mood and Affect: Mood normal.        Behavior: Behavior normal.     ED Results / Procedures / Treatments   Labs (all labs ordered are listed, but only abnormal results are displayed) Labs Reviewed  COMPREHENSIVE METABOLIC PANEL - Abnormal; Notable for the following components:      Result Value   Total Protein 5.8 (*)    All other components within normal limits  CBC WITH DIFFERENTIAL/PLATELET - Abnormal; Notable for the following components:   WBC 11.6 (*)    MCV 100.3 (*)    Lymphs Abs 7.5 (*)    All other components within normal limits  URINALYSIS, COMPLETE (UACMP) WITH MICROSCOPIC - Abnormal; Notable for the following components:   Color, Urine STRAW (*)    Hgb urine dipstick SMALL (*)    Leukocytes,Ua LARGE (*)    Bacteria, UA RARE (*)    All other components within normal limits  I-STAT CHEM 8, ED - Abnormal; Notable for the following components:   Calcium, Ion 1.13 (*)    All other components within normal limits  MAGNESIUM  CBG MONITORING, ED  TROPONIN I (HIGH SENSITIVITY)  TROPONIN I (HIGH SENSITIVITY)    EKG None  Radiology CT Angio Chest PE W and/or Wo Contrast  Result Date: 05/18/2020 CLINICAL DATA:  79 year old female with weakness and dizziness. Diagnosed with right lower extremity DVT 2 days ago. History of anal cancer. EXAM: CT ANGIOGRAPHY CHEST WITH CONTRAST TECHNIQUE: Multidetector CT imaging of the chest was performed using the standard protocol during bolus administration of intravenous contrast. Multiplanar CT image reconstructions and MIPs were obtained to evaluate the vascular  anatomy. CONTRAST:  163mL OMNIPAQUE IOHEXOL 350 MG/ML SOLN COMPARISON:  PET-CT 08/12/2018. FINDINGS: Cardiovascular: Good contrast bolus timing in the pulmonary arterial tree. Respiratory motion artifact most pronounced at the lung bases. No focal filling defect identified in the pulmonary arteries to suggest  acute pulmonary embolism. No pulmonary artery enlargement. Calcified coronary artery atherosclerosis. Calcified aortic atherosclerosis. Cardiac size at the upper limits of normal. No pericardial effusion. Mediastinum/Nodes: Negative. Chronic diminutive or absent right lobe of the thyroid. Lungs/Pleura: Mildly lower lung volumes compared to 2019. Major airways remain patent. Mild chronic pleural thickening and scarring in the left lower lobe appears stable. Similar hyperdense pleural thickening and scarring in the right apex is stable. Centrilobular emphysema is most apparent in the right upper lobe. No acute pulmonary finding. No pleural effusion. Upper Abdomen: Stable and negative visible upper abdominal viscera. Musculoskeletal: No acute osseous abnormality identified. Review of the MIP images confirms the above findings. IMPRESSION: 1. Negative for acute pulmonary embolus. 2. No acute findings in the chest. Chronic left lung pleural plaques. Aortic Atherosclerosis (ICD10-I70.0) and Emphysema (ICD10-J43.9). Electronically Signed   By: Genevie Ann M.D.   On: 05/18/2020 22:43    Procedures Procedures (including critical care time)  Medications Ordered in ED Medications  iohexol (OMNIPAQUE) 350 MG/ML injection 100 mL (100 mLs Intravenous Contrast Given 05/18/20 2212)    ED Course  I have reviewed the triage vital signs and the nursing notes.  Pertinent labs & imaging results that were available during my care of the patient were reviewed by me and considered in my medical decision making (see chart for details).    MDM Rules/Calculators/A&P                          EKG findings by my read: Compared to prior: 07/20/2018.  Rate: 66 rhythm: sinus Axis: appropriate  PR: Appropriate QRS: 128 QTc: 489.  Slight QT prolongation consistent with nonspecific conduction delay, otherwise no evidence of ischemia or arrhythmia, nor any other pathologic findings concerning considering patient presentation. Findings  discussed with attending who agrees.  Differential diagnosis considered: Arrhythmia, ACS, sepsis, pulmonary embolism  Patient presents to ED for palpitations and weakness, possible diaphoresis that began several hours ago, resolving after 2 hours.  Patient has numerous comorbidities and is currently under hospice care with a plan for full medical management and a recent admission for a DVT for which she is on anticoagulation.  Her symptoms have largely resolved at this time and her vitals are notably reassuring as is her physical exam, remarkable only for right leg swelling attributable to known DVT.  We will initiate broad altered mental status/cardiac work-up including a CT PE study, troponins.  CBC and chemistries unremarkable, urinalysis not suggestive of infection.  EKG as above reassuring.  Troponin negative at 4.  CT PE study entirely unremarkable  On reassessment, patient remains asymptomatic, reassured by negative work-up though very nervous about transport home  I personally sure transport with nurse from the facility [facility] who has assured me that the patient's caretaker [name] will be present to help take care of the patient when she arrives.  Patient will require ambulance transport back to her facility which we will arrange.  Will need follow-up in a couple days with PCP to assess for improvement.  Labs reviewed and interpreted by myself with significant findings above. Imaging reviewed by myself and interpreted by radiologist.  Case and plan above discussed with my attending Dr. Vanita Panda  Final Clinical Impression(s) / ED Diagnoses Final diagnoses:  Weakness  Palpitations    Rx / DC Orders ED Discharge Orders    None     Labs, studies and imaging reviewed by myself and considered in medical decision making if ordered. Imaging interpreted by radiology. Pt was discussed with my attending, Dr. Vanita Panda  Electronically signed by:  Roderic Palau [place]9/16/202111:[id number]  PM       Renold Genta, MD 05/18/20 2359    Carmin Muskrat, MD 05/24/20 [phone]

## 2020-05-18 NOTE — Telephone Encounter (Signed)
I received a call from Bethany Oconnor the [place] nurse. She reports that Bethany Oconnor is experiencing left arm weakness, dizziness, and feels unwell. She is concerned for stroke and is requesting to go to the ER. Based on our conversation earlier this would not be in line with her goals of care but at this time Bethany Oconnor has changed her mind and would like to be sent out. Order given to nurse in [place].

## 2020-05-18 NOTE — Progress Notes (Signed)
Location:  Occupational psychologist of Service:  ALF (13) Provider:   Cindi Carbon, ANP [place] [id number]   Gayland Curry, DO  Patient Care Team: Gayland Curry, DO as PCP - General (Geriatric Medicine) Sherren Mocha, MD as PCP - Cardiology (Cardiology) Crist Infante, MD (Internal Medicine) Newton Pigg, MD as Consulting Physician (Obstetrics and Gynecology) Truitt Merle, MD as Consulting Physician (Hematology) Leta Baptist, MD as Consulting Physician (Otolaryngology) Sherlynn Stalls, MD as Consulting Physician (Ophthalmology) Paulla Dolly Tamala Fothergill, DPM as Consulting Physician (Podiatry) Harriett Sine, MD as Consulting Physician (Dermatology) Erroll Luna, MD as Consulting Physician (General Surgery)  Extended Emergency Contact Information Primary Emergency Contact: [place], [place] Phone: [phone] Work Phone: [phone] Relation: None Secondary Emergency Contact: Madelin Headings Mobile Phone: [phone] Relation: Other  Code Status:  DNR Goals of care: Advanced Directive information Advanced Directives 04/11/2020  Does Patient Have a Medical Advance Directive? Yes  Type of Paramedic of [place];Out of facility DNR (pink MOST or yellow form);Living will  Does patient want to make changes to medical advance directive? No - Patient declined  Copy of [place] in Chart? Yes - validated most recent copy scanned in chart (See row information)  Would patient like information on creating a medical advance directive? -  Pre-existing out of facility DNR order (yellow form or pink MOST form) Pink MOST/Yellow Form most recent copy in chart - Physician notified to receive inpatient order     Chief Complaint  Patient presents with   Acute Visit    f/u doppler study    HPI:  Pt is a 79 y.o. female seen today for an acute visit for f/u regarding a doppler study. Ms. Bicking was diagnosed with  a DVT to the right external iliac vein, common femoral vein, right femoral vein, SF junction, right proximal profunda vein, right popliteal vein, and right posterior tibial vein noted on doppler study on 04/10/20.  She is on xarelto. She has hx of rectal cancer for which she received excision and chemotherapy.  She decided to forego any additional treatment due to her goals of care. She is now followed by hospice. She was on prempro during the clot event which was tapered. There was no recent trip to provoke the clot and she tried to stay active with exercise.  The oncall [facility] provider was notified on 9/12 due to continued swelling of the RLE.Another doppler was ordered which revealed the same thing, along with a structure in the right groin that measured 4.5x3.4x3.6cm possibly a lymph node.  She denies any cp or sob but continues with swelling of the RLE.    Past Medical History:  Diagnosis Date   Anxiety    Arthritis    Cerebral aneurysm    s/p endovascular colling 2008   CLL (chronic lymphocytic leukemia) (HCC)    CLL   Depression    Hearing loss    Macular degeneration    Neuropathy    Paroxysmal atrial fibrillation (HCC)    Pneumonia    08-04-17   PONV (postoperative nausea and vomiting)    Thyroid disease    Past Surgical History:  Procedure Laterality Date   ANEURYSM COILING     CATARACT EXTRACTION     bilateral   CERVICAL CONE BIOPSY     COLONOSCOPY     EVALUATION UNDER ANESTHESIA WITH HEMORRHOIDECTOMY N/A 09/03/2017   Procedure: EXAM UNDER ANESTHESIA, EXCISION OF ANAL LESION, POSSIBLE HEMORRHOIDECTOMY;  Surgeon:  Ileana Roup, MD;  Location: [facility];  Service: General;  Laterality: N/A;   LESION REMOVAL N/A 07/22/2018   Procedure: EXCISION OF PERIANAL LESION;  Surgeon: Ileana Roup, MD;  Location: [place];  Service: General;  Laterality: N/A;   LUMBAR LAMINECTOMY/DECOMPRESSION MICRODISCECTOMY Right 04/07/2018   Procedure: Right Lumbar Four-Five  Lumbar Five-Sacral One Laminectomy/Microdiscectomy;  Surgeon: Kristeen Miss, MD;  Location: [place];  Service: Neurosurgery;  Laterality: Right;  Right Lumbar Four-Five Lumbar Five-Sacral One Laminectomy/Microdiscectomy   RECTAL EXAM UNDER ANESTHESIA N/A 07/22/2018   Procedure: ANORECTAL EXAM UNDER ANESTHESIA ERAS PATHWAY;  Surgeon: Ileana Roup, MD;  Location: [place];  Service: General;  Laterality: N/A;   THYROIDECTOMY     TONSILLECTOMY      Allergies  Allergen Reactions   Ambien [Zolpidem Tartrate] Other (See Comments)    UNSPECIFIED REACTION    Fioricet-Codeine [Butalbital-Apap-Caff-Cod] Other (See Comments)    UNSPECIFIED REACTION    Macrobid [Nitrofurantoin] Other (See Comments)    UNSPECIFIED REACTION    Codeine Other (See Comments)    About 50 years ago, took Codeine for scratched cornea while pregnant, and it made her extremely sedated.   Hydrocodone-Acetaminophen Nausea And Vomiting   Keflex [Cephalexin] Rash   Percocet [Oxycodone-Acetaminophen] Nausea And Vomiting    Outpatient Encounter Medications as of 05/18/2020  Medication Sig   acetaminophen (TYLENOL) 500 MG tablet Take 500 mg by mouth See admin instructions. 500mg  twice a day scheduled and may take 500mg  twice a day as needed for pain twice daily.   albuterol (ACCUNEB) 0.63 MG/3ML nebulizer solution Take 1 ampule by nebulization every 6 (six) hours as needed for wheezing.   buPROPion (WELLBUTRIN XL) 150 MG 24 hr tablet Take 150 mg by mouth daily. Take with the 300mg  tablet for total 450mg .   buPROPion (WELLBUTRIN XL) 300 MG 24 hr tablet Take 450 mg by mouth daily. Take with the 150mg  tablet for total 450mg .   COMBIGAN 0.2-0.5 % ophthalmic solution Place 1 drop into the left eye 2 (two) times daily.    dextromethorphan-guaiFENesin (ROBAFEN DM CGH/CHEST CONGEST) 10-100 MG/5ML liquid Take 10 mLs by mouth every 4 (four) hours as needed for cough.   diltiazem (CARDIZEM CD) 180 MG 24 hr capsule Take 180  mg by mouth daily.   Diphenhyd-Hydrocort-Nystatin (FIRST-DUKES MOUTHWASH MT) Use as directed 5 mLs in the mouth or throat 4 (four) times daily as needed (mouth discomfort). Swish and spit.   donepezil (ARICEPT) 5 MG tablet Take 5 mg by mouth at bedtime.   dorzolamide (TRUSOPT) 2 % ophthalmic solution Place 1 drop into the left eye 3 (three) times daily.    estradiol (ESTRACE) 0.1 MG/GM vaginal cream Place 1 Applicatorful vaginally See admin instructions. Administer 3 times a week on Monday, Thursday, and Saturday.   estrogen, conjugated,-medroxyprogesterone (PREMPRO) 0.625-2.5 MG tablet Take 1 tablet by mouth daily.   Eyelid Cleansers (OCUSOFT EYELID CLEANSING) PADS Apply 1 each topically daily as needed (discharge in eyes).   gabapentin (NEURONTIN) 100 MG capsule Take 100 mg by mouth 2 (two) times daily.   hydrocortisone (ANUSOL-HC) 25 MG suppository Place 25 mg rectally 2 (two) times daily as needed for hemorrhoids or anal itching.   lamoTRIgine (LAMICTAL) 150 MG tablet Take 1 tablet (150 mg total) by mouth at bedtime.   latanoprost (XALATAN) 0.005 % ophthalmic solution PLACE 1 DROP INTO THE LEFT EYE AT BEDTIME.   Lidocaine (ASPERCREME LIDOCAINE) 4 % PTCH Apply 1 patch topically daily as needed (for lower back /  right hip pain).    lidocaine (XYLOCAINE) 2 % jelly Apply 1 application topically every 8 (eight) hours as needed (to vaginal area for catheter discomfort).    loperamide (IMODIUM A-D) 2 MG tablet Take 4 mg by mouth daily as needed for diarrhea or loose stools.   LORazepam (ATIVAN) 0.5 MG tablet Take 0.5 tablets (0.25 mg total) by mouth daily as needed for anxiety.   LORazepam (ATIVAN) 0.5 MG tablet Take 1 tablet (0.5 mg total) by mouth 2 (two) times daily.   meclizine (ANTIVERT) 25 MG tablet Take 25 mg by mouth 2 (two) times daily as needed for dizziness.   memantine (NAMENDA) 10 MG tablet Take 1 tablet (10 mg total) by mouth at bedtime.   ondansetron (ZOFRAN-ODT) 8 MG  disintegrating tablet Take 8 mg by mouth 2 (two) times daily as needed for nausea or vomiting.   phenylephrine-shark liver oil-mineral oil-petrolatum (PREPARATION H) 0.25-14-74.9 % rectal ointment Place 1 application rectally 4 (four) times daily as needed for hemorrhoids.   polyethylene glycol (MIRALAX / GLYCOLAX) packet Take 17 g by mouth daily as needed for moderate constipation.   Probiotic Product (ALIGN) 4 MG CAPS Take 4 mg by mouth daily.    risperiDONE (RISPERDAL) 0.25 MG tablet Take 0.25 mg by mouth 2 (two) times daily.   RIVAROXABAN (XARELTO) VTE STARTER PACK (15 & 20 MG TABLETS) Follow package directions: Take one 15mg  tablet by mouth twice a day. On day 22, switch to one 20mg  tablet once a day. Take with food.   sennosides-docusate sodium (SENOKOT-S) 8.6-50 MG tablet Take 1 tablet by mouth in the morning and at bedtime. Hold for loose stools or diarrhea.   Wheat Dextrin (BENEFIBER PO) Take 15 mLs by mouth 2 (two) times daily as needed (constipation). Stir 1 tablespoon in 4-8 oz of non- carbonated beverage until dissolved.   No facility-administered encounter medications on file as of 05/18/2020.    Review of Systems  Constitutional: Negative for activity change, appetite change, chills, diaphoresis, fatigue, fever and unexpected weight change.  HENT: Negative for congestion.   Respiratory: Negative for cough, shortness of breath and wheezing.   Cardiovascular: Positive for leg swelling. Negative for chest pain and palpitations.  Gastrointestinal: Negative for abdominal distention, abdominal pain, constipation and diarrhea.  Genitourinary: Negative for difficulty urinating and dysuria.  Musculoskeletal: Positive for gait problem. Negative for arthralgias, back pain, joint swelling and myalgias.  Skin: Negative for color change, pallor, rash and wound.  Neurological: Negative for dizziness, tremors, seizures, syncope, facial asymmetry, speech difficulty, weakness,  light-headedness, numbness and headaches.  Psychiatric/Behavioral: Positive for confusion. Negative for agitation and behavioral problems.    Immunization History  Administered Date(s) Administered   Influenza, High Dose Seasonal PF 06/19/2018   Influenza,inj,Quad PF,6+ Mos 07/01/2019   Influenza-Unspecified 06/27/2016   Moderna SARS-COVID-2 Vaccination 09/24/2019, 10/12/2019   Pneumococcal Conjugate-13 07/21/2013, 04/15/2016   Pneumococcal Polysaccharide-23 03/14/2010   Td 03/09/2010   Tdap 12/19/2016   Zoster 08/02/2005, 03/14/2010   Zoster Recombinat (Shingrix) 06/18/2017, 10/22/2017   Pertinent  Health Maintenance Due  Topic Date Due   HEMOGLOBIN A1C  12/10/2019   FOOT EXAM  12/21/2019   OPHTHALMOLOGY EXAM  03/24/2020   INFLUENZA VACCINE  04/02/2020   URINE MICROALBUMIN  06/09/2020   DEXA SCAN  Completed   PNA vac Low Risk Adult  Completed   Fall Risk  02/09/2020 11/17/2019 10/06/2019 06/09/2019 05/05/2019  Falls in the past year? 0 0 0 1 0  Number falls in past yr: 0  0 0 0 0  Injury with Fall? 0 0 0 0 0  Follow up - - - - -   Functional Status Survey:    There were no vitals filed for this visit. There is no height or weight on file to calculate BMI. Physical Exam Vitals and nursing note reviewed.  Constitutional:      General: She is not in acute distress.    Appearance: She is not diaphoretic.  HENT:     Head: Normocephalic and atraumatic.  Neck:     Vascular: No JVD.  Cardiovascular:     Rate and Rhythm: Normal rate and regular rhythm.     Pulses:          Dorsalis pedis pulses are 1+ on the right side and 1+ on the left side.     Heart sounds: No murmur heard.   Pulmonary:     Effort: Pulmonary effort is normal. No respiratory distress.     Breath sounds: Normal breath sounds. No wheezing.  Abdominal:     General: Bowel sounds are normal. There is no distension.     Palpations: Abdomen is soft.     Tenderness: There is no abdominal  tenderness.  Musculoskeletal:     Right lower leg: Edema (+2 no mild erythema uniformly of the RLE) present.     Left lower leg: No edema.     Comments: Mild warmth is noted to the right lower ext. No tenderness. Was not able to palpate the right groin lymph node.   Skin:    General: Skin is warm and dry.  Neurological:     General: No focal deficit present.     Mental Status: She is alert and oriented to person, place, and time. Mental status is at baseline.     Labs reviewed: Recent Labs    04/09/20 2359  NA 140  K 4.3  CL 108  CO2 23  GLUCOSE 94  BUN 11  CREATININE 0.69  CALCIUM 8.6*   Recent Labs    04/09/20 2359  AST 15  ALT 9  ALKPHOS 51  BILITOT 0.5  PROT 6.3*  ALBUMIN 3.3*   Recent Labs    04/09/20 2359  WBC 11.9*  NEUTROABS 3.8  HGB 12.4  HCT 37.1  MCV 98.4  PLT 125*   Lab Results  Component Value Date   TSH 1.78 11/11/2017   Lab Results  Component Value Date   HGBA1C 5.4 06/11/2019   Lab Results  Component Value Date   CHOL 146 11/11/2017   HDL 42 11/11/2017   LDLCALC 63 11/11/2017   TRIG 207 (A) 11/11/2017    Significant Diagnostic Results in last 30 days:  No results found.  Assessment/Plan 1. Acute deep vein thrombosis (DVT) of other specified vein of right lower extremity (HCC) The DVT has not improved at this point and she has associated adenopathy in the groin. I discussed her case with Dr. Eulas Post (with hospice) and we agreed to recheck the DVT study at 6 weeks however the patient is going out of town so we will order another study at 5 weeks and have a f/u with Dr. Mariea Clonts. If there continues to be no resolution we could consider Lovenox given her hx of cancer or Coumadin. Dr. Eulas Post clarified that Lovenox would not be covered by insurance. We discussed that travel increases your risk for embolism. [place] she may continue to do so once the  f/u study is completed.   2. Adenopathy Noted to the  right groin (also noted on PET scan in 2019), concerning given her hx of rectal cancer. The resident reiterated again today that she does not want to pursue treatment or additional testing for cancer. She does want treatment for the clot.    Family/ staff Communication: resident and her caregiver [name]  Labs/tests ordered:  Doppler study f/u 5 weeks and f/u apt with Dr. Mariea Clonts.

## 2020-05-19 NOTE — ED Notes (Signed)
[facility] sent vehicle from facility to take pt home, getting pt ready to go home

## 2020-05-19 NOTE — ED Notes (Signed)
Called facility to see if they were arranging transport for pt to go back to [facility], was told RN at facility would have to call me back.

## 2020-06-20 ENCOUNTER — Telehealth: Payer: Self-pay | Admitting: Internal Medicine

## 2020-06-20 NOTE — Telephone Encounter (Signed)
Still has what is being read as an acute nonocclusive DVT on right; however, she's had it already for a few months so not acute.  Still has lymph node in groin.   I recommend continuing xarelto since lovenox would not be covered on the hospice insurance plan.

## 2020-06-21 ENCOUNTER — Encounter: Payer: Self-pay | Admitting: Internal Medicine

## 2020-06-21 ENCOUNTER — Non-Acute Institutional Stay: Payer: Medicare Other | Admitting: Internal Medicine

## 2020-06-21 ENCOUNTER — Other Ambulatory Visit: Payer: Self-pay

## 2020-06-21 VITALS — BP 138/82 | HR 78 | Temp 97.7°F | Ht 65.0 in | Wt 173.8 lb

## 2020-06-21 DIAGNOSIS — C210 Malignant neoplasm of anus, unspecified: Secondary | ICD-10-CM | POA: Diagnosis not present

## 2020-06-21 DIAGNOSIS — R599 Enlarged lymph nodes, unspecified: Secondary | ICD-10-CM

## 2020-06-21 DIAGNOSIS — I82491 Acute embolism and thrombosis of other specified deep vein of right lower extremity: Secondary | ICD-10-CM

## 2020-06-21 DIAGNOSIS — F01518 Vascular dementia, unspecified severity, with other behavioral disturbance: Secondary | ICD-10-CM

## 2020-06-21 DIAGNOSIS — M79604 Pain in right leg: Secondary | ICD-10-CM | POA: Diagnosis not present

## 2020-06-21 DIAGNOSIS — F0151 Vascular dementia with behavioral disturbance: Secondary | ICD-10-CM

## 2020-06-21 NOTE — Progress Notes (Signed)
Location:  [facility] of Service:  Clinic (12)  Provider: Shakari Qazi L. Mariea Clonts, D.O., C.M.D.  Code Status: DNR, hospice Goals of Care:  Advanced Directives 05/18/2020  Does Patient Have a Medical Advance Directive? Yes  Type of Advance Directive Out of facility DNR (pink MOST or yellow form)  Does patient want to make changes to medical advance directive? No - Patient declined  Copy of [place] in Chart? -  Would patient like information on creating a medical advance directive? -  Pre-existing out of facility DNR order (yellow form or pink MOST form) -     Chief Complaint  Patient presents with  . Medical Management of Chronic Issues    4 month follow up   . Health Maintenance    Influenza ([place])    HPI: Patient is a 79 y.o. female seen today for medical management of chronic diseases.    She's had her f/u venous doppler of her right leg and still has thrombus present throughout her right leg.  She also has a lymph node in her right groin.  She's been on xarelto therapy, 20mg  daily since the DVT was found in the ED.  She is on hospice care for her anal cancer.  NP had spoke with Dr. Eulas Post with hospice who suggested lovenox due to this underlying cancer; however, lovenox is not on hospice formulary and means injections for the patient each day.   She does not want daily injections and is very clear about this.    CNA asks about remeasuring her right leg for a new compression sock due to crease at ankle.    She got a recliner to elevate her feet sitting.  They'd been putting her in a chair with another chair and pillows.  Past Medical History:  Diagnosis Date  . Anxiety   . Arthritis   . Cerebral aneurysm    s/p endovascular colling 2008  . CLL (chronic lymphocytic leukemia) (HCC)    CLL  . Depression   . Hearing loss   . Macular degeneration   . Neuropathy   . Paroxysmal atrial fibrillation (HCC)   . Pneumonia    08-04-17    . PONV (postoperative nausea and vomiting)   . Thyroid disease     Past Surgical History:  Procedure Laterality Date  . ANEURYSM COILING    . CATARACT EXTRACTION     bilateral  . CERVICAL CONE BIOPSY    . COLONOSCOPY    . EVALUATION UNDER ANESTHESIA WITH HEMORRHOIDECTOMY N/A 09/03/2017   Procedure: EXAM UNDER ANESTHESIA, EXCISION OF ANAL LESION, POSSIBLE HEMORRHOIDECTOMY;  Surgeon: Ileana Roup, MD;  Location: [facility];  Service: General;  Laterality: N/A;  . LESION REMOVAL N/A 07/22/2018   Procedure: EXCISION OF PERIANAL LESION;  Surgeon: Ileana Roup, MD;  Location: [facility];  Service: General;  Laterality: N/A;  . LUMBAR LAMINECTOMY/DECOMPRESSION MICRODISCECTOMY Right 04/07/2018   Procedure: Right Lumbar Four-Five Lumbar Five-Sacral One Laminectomy/Microdiscectomy;  Surgeon: Kristeen Miss, MD;  Location: [facility];  Service: Neurosurgery;  Laterality: Right;  Right Lumbar Four-Five Lumbar Five-Sacral One Laminectomy/Microdiscectomy  . RECTAL EXAM UNDER ANESTHESIA N/A 07/22/2018   Procedure: ANORECTAL EXAM UNDER ANESTHESIA ERAS PATHWAY;  Surgeon: Ileana Roup, MD;  Location: [facility];  Service: General;  Laterality: N/A;  . THYROIDECTOMY    . TONSILLECTOMY      Allergies  Allergen Reactions  . Ambien [Zolpidem Tartrate] Other (See Comments)    UNSPECIFIED REACTION   .  Fioricet-Codeine [Butalbital-Apap-Caff-Cod] Other (See Comments)    UNSPECIFIED REACTION   . Macrobid [Nitrofurantoin] Other (See Comments)    UNSPECIFIED REACTION   . Codeine Other (See Comments)    About 50 years ago, took Codeine for scratched cornea while pregnant, and it made her extremely sedated.  . Hydrocodone-Acetaminophen Nausea And Vomiting  . Keflex [Cephalexin] Rash  . Percocet [Oxycodone-Acetaminophen] Nausea And Vomiting    Outpatient Encounter Medications as of 06/21/2020  Medication Sig  . acetaminophen (TYLENOL) 500 MG tablet Take 500 mg by mouth See admin instructions. 500mg  twice a  day scheduled and may take 500mg  twice a day as needed for pain twice daily.  Marland Kitchen albuterol (ACCUNEB) 0.63 MG/3ML nebulizer solution Take 1 ampule by nebulization every 6 (six) hours as needed for wheezing.  Marland Kitchen buPROPion (WELLBUTRIN XL) 150 MG 24 hr tablet Take 150 mg by mouth daily. Take with the 300mg  tablet for total 450mg .  . buPROPion (WELLBUTRIN XL) 300 MG 24 hr tablet Take 450 mg by mouth daily. Take with the 150mg  tablet for total 450mg .  . COMBIGAN 0.2-0.5 % ophthalmic solution Place 1 drop into the left eye 2 (two) times daily.   Marland Kitchen dextromethorphan-guaiFENesin (ROBAFEN DM CGH/CHEST CONGEST) 10-100 MG/5ML liquid Take 10 mLs by mouth every 4 (four) hours as needed for cough.  . diltiazem (CARDIZEM CD) 180 MG 24 hr capsule Take 180 mg by mouth daily.  . Diphenhyd-Hydrocort-Nystatin (FIRST-DUKES MOUTHWASH MT) Use as directed 5 mLs in the mouth or throat 4 (four) times daily as needed (mouth discomfort). Swish and spit.  Marland Kitchen donepezil (ARICEPT) 5 MG tablet Take 5 mg by mouth at bedtime.  . dorzolamide (TRUSOPT) 2 % ophthalmic solution Place 1 drop into the left eye 3 (three) times daily.   Marland Kitchen estradiol (ESTRACE) 0.1 MG/GM vaginal cream Place 1 Applicatorful vaginally See admin instructions. Administer 3 times a week on Monday, Thursday, and Saturday.  . estrogen, conjugated,-medroxyprogesterone (PREMPRO) 0.625-2.5 MG tablet Take 1 tablet by mouth daily.  . Eyelid Cleansers (OCUSOFT EYELID CLEANSING) PADS Apply 1 each topically daily as needed (discharge in eyes).  . gabapentin (NEURONTIN) 100 MG capsule Take 100 mg by mouth 2 (two) times daily.  . hydrocortisone (ANUSOL-HC) 25 MG suppository Place 25 mg rectally 2 (two) times daily as needed for hemorrhoids or anal itching.  . lamoTRIgine (LAMICTAL) 150 MG tablet Take 1 tablet (150 mg total) by mouth at bedtime.  Marland Kitchen latanoprost (XALATAN) 0.005 % ophthalmic solution PLACE 1 DROP INTO THE LEFT EYE AT BEDTIME.  Marland Kitchen Lidocaine (ASPERCREME LIDOCAINE) 4 % PTCH  Apply 1 patch topically daily as needed (for lower back / right hip pain).   Marland Kitchen lidocaine (XYLOCAINE) 2 % jelly Apply 1 application topically every 8 (eight) hours as needed (to vaginal area for catheter discomfort).   Marland Kitchen loperamide (IMODIUM A-D) 2 MG tablet Take 4 mg by mouth daily as needed for diarrhea or loose stools.  Marland Kitchen LORazepam (ATIVAN) 0.5 MG tablet Take 0.5 tablets (0.25 mg total) by mouth daily as needed for anxiety.  Marland Kitchen LORazepam (ATIVAN) 0.5 MG tablet Take 1 tablet (0.5 mg total) by mouth 2 (two) times daily.  . meclizine (ANTIVERT) 25 MG tablet Take 25 mg by mouth 2 (two) times daily as needed for dizziness.  . memantine (NAMENDA) 10 MG tablet Take 1 tablet (10 mg total) by mouth at bedtime.  . ondansetron (ZOFRAN-ODT) 8 MG disintegrating tablet Take 8 mg by mouth 2 (two) times daily as needed for nausea or vomiting.  Marland Kitchen  phenylephrine-shark liver oil-mineral oil-petrolatum (PREPARATION H) 0.25-14-74.9 % rectal ointment Place 1 application rectally 4 (four) times daily as needed for hemorrhoids.  . polyethylene glycol (MIRALAX / GLYCOLAX) packet Take 17 g by mouth daily as needed for moderate constipation.  . Probiotic Product (ALIGN) 4 MG CAPS Take 4 mg by mouth daily.   . risperiDONE (RISPERDAL) 0.25 MG tablet Take 0.25 mg by mouth 2 (two) times daily.  Marland Kitchen RIVAROXABAN (XARELTO) VTE STARTER PACK (15 & 20 MG TABLETS) Follow package directions: Take one 15mg  tablet by mouth twice a day. On day 22, switch to one 20mg  tablet once a day. Take with food.  . sennosides-docusate sodium (SENOKOT-S) 8.6-50 MG tablet Take 1 tablet by mouth in the morning and at bedtime. Hold for loose stools or diarrhea.  . Wheat Dextrin (BENEFIBER PO) Take 15 mLs by mouth 2 (two) times daily as needed (constipation). Stir 1 tablespoon in 4-8 oz of non- carbonated beverage until dissolved.   No facility-administered encounter medications on file as of 06/21/2020.    Review of Systems:  Review of Systems    Constitutional: Positive for malaise/fatigue. Negative for chills and fever.  HENT: Positive for hearing loss.   Eyes: Positive for blurred vision.  Respiratory: Negative for cough and shortness of breath.   Cardiovascular: Positive for leg swelling. Negative for chest pain, palpitations, orthopnea and PND.  Gastrointestinal: Negative for abdominal pain, blood in stool, constipation, diarrhea and melena.  Genitourinary: Negative for dysuria.  Musculoskeletal: Positive for myalgias. Negative for falls and joint pain.  Skin: Negative for itching and rash.  Neurological: Negative for dizziness and loss of consciousness.  Endo/Heme/Allergies: Bruises/bleeds easily.  Psychiatric/Behavioral: Positive for depression and memory loss. The patient is nervous/anxious.     Health Maintenance  Topic Date Due  . HEMOGLOBIN A1C  12/10/2019  . FOOT EXAM  12/21/2019  . OPHTHALMOLOGY EXAM  03/24/2020  . INFLUENZA VACCINE  04/02/2020  . URINE MICROALBUMIN  06/09/2020  . TETANUS/TDAP  12/20/2026  . DEXA SCAN  Completed  . COVID-19 Vaccine  Completed  . PNA vac Low Risk Adult  Completed    Physical Exam: Vitals:   06/21/20 1352  BP: 138/82  Pulse: 78  Temp: 97.7 F (36.5 C)  TempSrc: Temporal  SpO2: 94%  Weight: 173 lb 12.8 oz (78.8 kg)  Height: 5\' 5"  (1.651 m)   Body mass index is 28.92 kg/m. Physical Exam Vitals reviewed.  Constitutional:      General: She is not in acute distress.    Appearance: Normal appearance. She is not ill-appearing or toxic-appearing.  HENT:     Head: Normocephalic and atraumatic.  Eyes:     Extraocular Movements: Extraocular movements intact.     Conjunctiva/sclera: Conjunctivae normal.     Pupils: Pupils are equal, round, and reactive to light.  Cardiovascular:     Rate and Rhythm: Rhythm irregular.     Pulses: Normal pulses.     Heart sounds: Normal heart sounds.  Pulmonary:     Effort: Pulmonary effort is normal.     Breath sounds: Normal breath  sounds. No wheezing, rhonchi or rales.  Abdominal:     General: Bowel sounds are normal. There is no distension.     Tenderness: There is no abdominal tenderness. There is no guarding or rebound.  Musculoskeletal:        General: Tenderness present. Normal range of motion.     Right lower leg: Edema present.     Left lower leg: No  edema.     Comments: Right posterior leg  Skin:    General: Skin is warm and dry.     Capillary Refill: Capillary refill takes less than 2 seconds.  Neurological:     Mental Status: She is alert.     Cranial Nerves: No cranial nerve deficit.     Comments: Short-term memory loss  Psychiatric:        Mood and Affect: Mood normal.     Labs reviewed: Basic Metabolic Panel: Recent Labs    04/09/20 2359 05/18/20 2039 05/18/20 2110  NA 140 140 142  K 4.3 4.3 4.2  CL 108 107 106  CO2 23 22  --   GLUCOSE 94 88 83  BUN 11 10 13   CREATININE 0.69 0.77 0.70  CALCIUM 8.6* 8.9  --   MG  --  2.0  --    Liver Function Tests: Recent Labs    04/09/20 2359 05/18/20 2039  AST 15 21  ALT 9 12  ALKPHOS 51 50  BILITOT 0.5 0.5  PROT 6.3* 5.8*  ALBUMIN 3.3* 3.5   No results for input(s): LIPASE, AMYLASE in the last 8760 hours. No results for input(s): AMMONIA in the last 8760 hours. CBC: Recent Labs    04/09/20 2359 05/18/20 2039 05/18/20 2110  WBC 11.9* 11.6*  --   NEUTROABS 3.8 3.3  --   HGB 12.4 12.8 13.3  HCT 37.1 39.7 39.0  MCV 98.4 100.3*  --   PLT 125* 166  --    Lipid Panel: No results for input(s): CHOL, HDL, LDLCALC, TRIG, CHOLHDL, LDLDIRECT in the last 8760 hours. Lab Results  Component Value Date   HGBA1C 5.4 06/11/2019    Assessment/Plan 1. Right leg pain -discussed with pt and caregiver that this is due to her DVT that has been unchanged since its' onset--remains throughout her RLE unfortunately -discussed option of lovenox, but she has no desire to take injections at least once a day at her age and with her other  problems -opted to continue NOAC as is -explained that muscle relaxer might cause her to be drowsy and she opted not to have it either at this time--pain is sporadic and goes away  2. Acute deep vein thrombosis (DVT) of other specified vein of right lower extremity ([place]) -cont xarelto -she prefers to be able to get up and walk with caregivers and walker which is permitted with her goals of care -she understands that should could get a sudden PE that could end her life but is ok with that  3. Adenopathy -due to #4, does not report local discomfort in groin  4. Anal cancer ([place]) -on hospice care for this, no new symptoms in rectal area, no changes to bowel habits  5. Vascular dementia with behavior disturbance ([place]) -has progressed some recently with more short-term memory challenges, cont AL and caregiver support  Labs/tests ordered:  No new Next appt:  10/25/2020  Marchello Rothgeb L. Rosario Kushner, D.O. [facility] [address], [place] [address] Cell Phone (Mon-Fri 8am-5pm):  [phone] On Call:  [phone] & follow prompts after 5pm & weekends Office Phone:  [phone] Office Fax:  [phone]

## 2020-06-23 MED ORDER — RIVAROXABAN 20 MG PO TABS: 20.0000 mg | ORAL_TABLET | Freq: Every day | ORAL | 5 refills | Status: DC

## 2020-07-07 ENCOUNTER — Encounter: Payer: Self-pay | Admitting: Internal Medicine

## 2020-07-19 ENCOUNTER — Telehealth: Payer: Self-pay

## 2020-07-19 NOTE — Telephone Encounter (Signed)
Daiva Eves, hospice nurse [phone]) is at [facility] today. She is asking about Ms. Buendia's pain and what management can be offered.   She asked if she could stop by and speak with you and I told her I would send you a telephone note instead since you have a full schedule. I will send a chat to [facility] and let her know also so that you can let you know in case you want to address this while you are there today.

## 2020-07-19 NOTE — Telephone Encounter (Signed)
I didn't hear about it.  I just read this at 6:48pm.  I will copy Christy on this to see if she can address her pain when she is at [employer] [employer] tomorrow.

## 2020-07-20 ENCOUNTER — Encounter: Payer: Self-pay | Admitting: Adult Health

## 2020-07-20 NOTE — Telephone Encounter (Signed)
I will address it today. Since this resident lives in [place] this should be addressed through the nursing supervisor. This is a new hospice nurse so I will let her know the proper route of communication.

## 2020-07-31 ENCOUNTER — Other Ambulatory Visit: Payer: Self-pay | Admitting: Adult Health

## 2020-07-31 MED ORDER — LORAZEPAM 0.5 MG PO TABS: 0.5000 mg | ORAL_TABLET | Freq: Two times a day (BID) | ORAL | 5 refills | Status: DC

## 2020-08-14 ENCOUNTER — Encounter (HOSPITAL_COMMUNITY): Payer: Self-pay

## 2020-08-14 ENCOUNTER — Emergency Department (HOSPITAL_COMMUNITY)

## 2020-08-14 ENCOUNTER — Other Ambulatory Visit: Payer: Self-pay

## 2020-08-14 ENCOUNTER — Emergency Department (HOSPITAL_COMMUNITY)
Admission: EM | Admit: 2020-08-14 | Discharge: 2020-08-14 | Disposition: A | Discharge status: Home / Self Care | Attending: Emergency Medicine | Admitting: Emergency Medicine

## 2020-08-14 DIAGNOSIS — Y92002 Bathroom of unspecified non-institutional (private) residence single-family (private) house as the place of occurrence of the external cause: Secondary | ICD-10-CM | POA: Diagnosis not present

## 2020-08-14 DIAGNOSIS — S0990XA Unspecified injury of head, initial encounter: Secondary | ICD-10-CM | POA: Diagnosis present

## 2020-08-14 DIAGNOSIS — F0390 Unspecified dementia without behavioral disturbance: Secondary | ICD-10-CM | POA: Diagnosis not present

## 2020-08-14 DIAGNOSIS — S0101XA Laceration without foreign body of scalp, initial encounter: Secondary | ICD-10-CM | POA: Diagnosis not present

## 2020-08-14 DIAGNOSIS — Z85048 Personal history of other malignant neoplasm of rectum, rectosigmoid junction, and anus: Secondary | ICD-10-CM | POA: Insufficient documentation

## 2020-08-14 DIAGNOSIS — W01198A Fall on same level from slipping, tripping and stumbling with subsequent striking against other object, initial encounter: Secondary | ICD-10-CM | POA: Diagnosis not present

## 2020-08-14 DIAGNOSIS — W19XXXA Unspecified fall, initial encounter: Secondary | ICD-10-CM

## 2020-08-14 LAB — CBC WITH DIFFERENTIAL/PLATELET
Abs Immature Granulocytes: 0.04 10*3/uL (ref 0.00–0.07)
Basophils Absolute: 0 10*3/uL (ref 0.0–0.1)
Basophils Relative: 0 %
Eosinophils Absolute: 0.1 10*3/uL (ref 0.0–0.5)
Eosinophils Relative: 1 %
HCT: 43.7 % (ref 36.0–46.0)
Hemoglobin: 13.6 g/dL (ref 12.0–15.0)
Immature Granulocytes: 0 %
Lymphocytes Relative: 59 %
Lymphs Abs: 6.9 10*3/uL — ABNORMAL HIGH (ref 0.7–4.0)
MCH: 31.5 pg (ref 26.0–34.0)
MCHC: 31.1 g/dL (ref 30.0–36.0)
MCV: 101.2 fL — ABNORMAL HIGH (ref 80.0–100.0)
Monocytes Absolute: 0.6 10*3/uL (ref 0.1–1.0)
Monocytes Relative: 5 %
Neutro Abs: 4.1 10*3/uL (ref 1.7–7.7)
Neutrophils Relative %: 35 %
Platelets: 199 10*3/uL (ref 150–400)
RBC: 4.32 MIL/uL (ref 3.87–5.11)
RDW: 14 % (ref 11.5–15.5)
WBC: 11.7 10*3/uL — ABNORMAL HIGH (ref 4.0–10.5)
nRBC: 0 % (ref 0.0–0.2)

## 2020-08-14 LAB — COMPREHENSIVE METABOLIC PANEL
ALT: 9 U/L (ref 0–44)
AST: 18 U/L (ref 15–41)
Albumin: 3.4 g/dL — ABNORMAL LOW (ref 3.5–5.0)
Alkaline Phosphatase: 59 U/L (ref 38–126)
Anion gap: 11 (ref 5–15)
BUN: 13 mg/dL (ref 8–23)
CO2: 22 mmol/L (ref 22–32)
Calcium: 9 mg/dL (ref 8.9–10.3)
Chloride: 107 mmol/L (ref 98–111)
Creatinine, Ser: 0.98 mg/dL (ref 0.44–1.00)
GFR, Estimated: 58 mL/min — ABNORMAL LOW (ref >60)
Glucose, Bld: 129 mg/dL — ABNORMAL HIGH (ref 70–99)
Potassium: 4.4 mmol/L (ref 3.5–5.1)
Sodium: 140 mmol/L (ref 135–145)
Total Bilirubin: 0.6 mg/dL (ref 0.3–1.2)
Total Protein: 6.5 g/dL (ref 6.5–8.1)

## 2020-08-14 LAB — PROTIME-INR
INR: 1.4 — ABNORMAL HIGH (ref 0.8–1.2)
Prothrombin Time: 16.9 seconds — ABNORMAL HIGH (ref 11.4–15.2)

## 2020-08-14 MED ORDER — SODIUM CHLORIDE 0.9 % IV BOLUS
1000.0000 mL | Freq: Once | INTRAVENOUS | Status: AC
  Administered 2020-08-14: 17:00:00 1000 mL via INTRAVENOUS

## 2020-08-14 MED ORDER — FENTANYL CITRATE (PF) 100 MCG/2ML IJ SOLN
50.0000 ug | Freq: Once | INTRAMUSCULAR | Status: AC
  Administered 2020-08-14: 16:00:00 50 ug via INTRAVENOUS
  Filled 2020-08-14: qty 2

## 2020-08-14 NOTE — ED Notes (Signed)
Patient transported to CT 

## 2020-08-14 NOTE — ED Triage Notes (Signed)
Pt was BIB by [facility] d/t a fall that happened while she was backing away from the bathroom sink. It was reported by her care taker that she fell backwards, landing on ceramic tile flooring & hit her head on the corner of the shower floor. Pt denies LOC, does have memory of fall, arrived to ED A/Ox4 with some confusion noted at baseline (per care taker). C-collar in place, dressing around head, no active bleeding noted, Pt blind in Left eye, Right pupil PEARRL. Pt endorses 8/10 pain in her head, bilateral elbows (where skin tears are noted) & bilateral knee pain, as well as tale bone pain.

## 2020-08-14 NOTE — Discharge Instructions (Addendum)
We discussed the results of your CT scan which were normal.  You may take ibuprofen or Tylenol to help with the pain.    I have also placed 3 staples to the back of your head, these will need to be removed within 7-10 days.

## 2020-08-14 NOTE — ED Notes (Signed)
Rn attempted to call report

## 2020-08-14 NOTE — ED Notes (Signed)
Patient transported to X-ray 

## 2020-08-14 NOTE — ED Provider Notes (Signed)
[facility] Provider Note   CSN: [id number] Arrival date & time: 08/14/20  1333     History Chief Complaint  Patient presents with  . Level 2, Fall on thinners    Bethany Oconnor is a 79 y.o. female.  79 y.o female with an extensive PMH of anxiety, CLL, paroxysmal A. fib, Lewy body dementia, thyroid disease, anal cancer for which she is on hospice as well as recent admission for right lower extremity DVT.  Patient presented to the ED via EMS status post fall.  According to caretaker [name] at the bedside, patient was at the sink, washing her hands when she suddenly fell backwards, striking the back of her head onto the shower.  Significant amount of bleeding was found at the scene.  Patient does report feeling overall weakness.  Orthostatic vitals were taken by EMS, these were positive in nature.  No recent sickness.  She denies any URI, urinary symptoms, chest pain, shortness of breath or abdominal pain.  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  The history is provided by the patient.       History reviewed. No pertinent past medical history.  There are no problems to display for this patient.      OB History   No obstetric history on file.     History reviewed. No pertinent family history.     Home Medications Prior to Admission medications   Medication Sig Start Date End Date Taking? Authorizing Provider  acetaminophen (TYLENOL) 500 MG tablet Take 500 mg by mouth See admin instructions. Take one tablet (500 mg) by mouth twice daily, may also take one tablet (500 mg) twice daily as needed for pain   Yes [provider]  albuterol (ACCUNEB) 0.63 MG/3ML nebulizer solution Take 1 ampule by nebulization every 6 (six) hours as needed for wheezing.   Yes [provider]  brimonidine-timolol (COMBIGAN) 0.2-0.5 % ophthalmic solution Place 1 drop into the left eye 2 (two) times  daily.   Yes [provider]  buPROPion (WELLBUTRIN XL) 150 MG 24 hr tablet Take 150 mg by mouth every morning. Take with a 300 mg tablet for a total dose of 450 mg every morning   Yes [provider]  buPROPion (WELLBUTRIN XL) 300 MG 24 hr tablet Take 300 mg by mouth every morning. Take with a 150 mg tablet for a total dose of 450 mg every morning   Yes [provider]  cetirizine (ZYRTEC) 10 MG tablet Take 10 mg by mouth daily as needed for allergies.   Yes [provider]  Dextromethorphan-guaiFENesin 10-100 MG/5ML liquid Take 10 mLs by mouth every 6 (six) hours as needed (cough/congestion).   Yes [provider]  diltiazem (CARDIZEM CD) 180 MG 24 hr capsule Take 180 mg by mouth daily.   Yes [provider]  Diphenhyd-Hydrocort-Nystatin (FIRST-DUKES MOUTHWASH MT) Use as directed 5 mLs in the mouth or throat See admin instructions. Swish and sip 5 mls by mouth 4 times daily as needed for mouth discomfort   Yes [provider]  donepezil (ARICEPT) 5 MG tablet Take 5 mg by mouth at bedtime.   Yes [provider]  dorzolamide (TRUSOPT) 2 % ophthalmic solution Place 1 drop into the left eye 3 (three) times daily.   Yes [provider]  estradiol (ESTRACE) 0.1 MG/GM vaginal cream Place 1 Applicatorful vaginally See admin instructions. Apply small amount vaginally three times weekly -  Monday, Thursday, Saturday   Yes [provider]  Eyelid Cleansers (OCUSOFT EYELID CLEANSING) PADS Place 1 patch into both eyes daily as needed (eye discharge).   Yes [provider]  fluticasone (FLONASE) 50 MCG/ACT nasal spray Place 2 sprays into both nostrils every 4 (four) hours as needed for allergies or rhinitis.   Yes [provider]  gabapentin (NEURONTIN) 100 MG capsule Take 200 mg by mouth 2 (two) times daily.   Yes [provider]  hydrocortisone (ANUSOL-HC) 25 MG suppository Place 25 mg rectally 2  (two) times daily as needed for hemorrhoids or anal itching.   Yes [provider]  HYDROmorphone (DILAUDID) 2 MG tablet Take 0.5 mg by mouth at bedtime.   Yes [provider]  ibuprofen (ADVIL) 400 MG tablet Take 400 mg by mouth every 8 (eight) hours as needed for moderate pain.   Yes [provider]  lamoTRIgine (LAMICTAL) 150 MG tablet Take 150 mg by mouth at bedtime. For bipolar depression   Yes [provider]  latanoprost (XALATAN) 0.005 % ophthalmic solution Place 1 drop into the left eye at bedtime.   Yes [provider]  lidocaine (XYLOCAINE) 2 % jelly 1 application by Other route See admin instructions. Apply topically to vaginal area three times daily as needed for catheter discomfort   Yes [provider]  Lidocaine 4 % PTCH Place 1 patch onto the skin daily as needed (lower back pain).   Yes [provider]  loperamide (IMODIUM) 2 MG capsule Take 4 mg by mouth daily as needed for diarrhea or loose stools.   Yes [provider]  LORazepam (ATIVAN) 0.5 MG tablet Take 0.25-0.5 mg by mouth See admin instructions. Take one tablet (0.5 mg) by mouth twice daily, may also take 1/2 tablet (0.25 mg) daily as needed for breakthrough anxiety   Yes [provider]  meclizine (ANTIVERT) 25 MG tablet Take 25 mg by mouth 2 (two) times daily as needed for dizziness.   Yes [provider]  memantine (NAMENDA) 10 MG tablet Take 10 mg by mouth every evening.   Yes [provider]  ondansetron (ZOFRAN) 8 MG tablet Take 8 mg by mouth 2 (two) times daily as needed for nausea or vomiting.   Yes [provider]  PHENYLEPHRINE-MINERAL OIL-PET RE Place 1 application rectally 4 (four) times daily as needed (hemorrhoids).   Yes [provider]  polyethylene glycol (MIRALAX / GLYCOLAX) 17 g packet Take 17 g by mouth daily as needed (constipation). Mix in 8 oz fluid   Yes [provider]   Probiotic Product (ALIGN) 4 MG CAPS Take 4 mg by mouth daily.   Yes [provider]  risperiDONE (RISPERDAL) 0.25 MG tablet Take 0.25 mg by mouth 2 (two) times daily.   Yes [provider]  rivaroxaban (XARELTO) 20 MG TABS tablet Take 20 mg by mouth at bedtime.   Yes [provider]  senna-docusate (SENOKOT-S) 8.6-50 MG tablet Take 1 tablet by mouth 2 (two) times daily.   Yes [provider]  sodium chloride (OCEAN) 0.65 % SOLN nasal spray Place 1 spray into both nostrils every hour as needed (dry nose).   Yes [provider]  Wheat Dextrin (BENEFIBER) POWD Take 15 mLs by mouth 2 (two) times daily as needed (constipation). Mix in 4-8 oz of any non-carbonate beverage (hot or cold) and stir well until dissolved.   Yes [provider]    Allergies    Ambien [  zolpidem], Codeine, Fioricet [butalbital-apap-caffeine], Macrobid [nitrofurantoin], Norco [hydrocodone-acetaminophen], Oxycodone, Percocet [oxycodone-acetaminophen], and Keflex [cephalexin]  Review of Systems   Review of Systems  Constitutional: Negative for fever.  HENT: Negative for sore throat.   Respiratory: Negative for shortness of breath.   Cardiovascular: Negative for chest pain.  Gastrointestinal: Negative for abdominal pain and vomiting.  Genitourinary: Negative for flank pain.  Musculoskeletal: Negative for back pain.  Skin: Positive for wound. Negative for pallor.  Neurological: Positive for syncope and weakness.    Physical Exam Updated Vital Signs BP (!) 147/71   Pulse 70   Temp (!) 97.5 F (36.4 C) (Oral)   Resp 16   Ht 5\' 4"  (1.626 m)   Wt 77.1 kg   SpO2 98%   BMI 29.18 kg/m   Physical Exam Vitals and nursing note reviewed.  HENT:     Head: Normocephalic.     Comments: Laceration to the back of her head, pressure dressing present.    Mouth/Throat:     Mouth: Mucous membranes are moist.  Eyes:     Pupils: Pupils are equal, round, and reactive to  light.     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  Neck:     Comments: Aspen c-collar present placed by EMS. Pulmonary:     Effort: Pulmonary effort is normal.     Breath sounds: No wheezing or rales.  Abdominal:     General: Abdomen is flat.     Palpations: Abdomen is soft.     Tenderness: There is no abdominal tenderness.     Comments: Abdomen is soft, nontender to palpation.  Skin:    General: Skin is warm.     Findings: Bruising present.     Comments: Skin tears to bilateral elbows.  Neurological:     Mental Status: She is alert. Mental status is at baseline.     Comments: ANO x4.  Caretaker at the bedside confirms patient is at her baseline.     ED Results / Procedures / Treatments   Labs (all labs ordered are listed, but only abnormal results are displayed) Labs Reviewed  CBC WITH DIFFERENTIAL/PLATELET - Abnormal; Notable for the following components:      Result Value   WBC 11.7 (*)    MCV 101.2 (*)    Lymphs Abs 6.9 (*)    All other components within normal limits  COMPREHENSIVE METABOLIC PANEL - Abnormal; Notable for the following components:   Glucose, Bld 129 (*)    Albumin 3.4 (*)    GFR, Estimated 58 (*)    All other components within normal limits  PROTIME-INR - Abnormal; Notable for the following components:   Prothrombin Time 16.9 (*)    INR 1.4 (*)    All other components within normal limits    EKG EKG Interpretation  Date/Time:  Monday August 14 2020 13:40:25 EST Ventricular Rate:  65 PR Interval:    QRS Duration: 126 QT Interval:  455 QTC Calculation: 474 R Axis:   78 Text Interpretation: Sinus rhythm Nonspecific intraventricular conduction delay No acute changes No old tracing to compare Confirmed by Varney Biles [phone]) on 08/14/2020 2:23:52 PM   Radiology DG Chest 1 View  Result Date: 08/14/2020 CLINICAL DATA:  Fall on blood thinners. EXAM: CHEST  1 VIEW COMPARISON:  Chest radiograph 09/01/2017. Chest CT  a 05/18/2020 FINDINGS: The cardiomediastinal contours are normal. Biapical pleuroparenchymal scarring, left greater than right. Pulmonary vasculature is normal. No consolidation, pleural effusion, or pneumothorax.  No acute osseous abnormalities are seen. IMPRESSION: No acute chest findings or evidence of acute injury. Electronically Signed   By: Keith Rake M.D.   On: 08/14/2020 15:09   CT Head Wo Contrast  Result Date: 08/14/2020 CLINICAL DATA:  Head trauma, minor. Neck trauma. Additional history provided: Fall. EXAM: CT HEAD WITHOUT CONTRAST CT CERVICAL SPINE WITHOUT CONTRAST TECHNIQUE: Multidetector CT imaging of the head and cervical spine was performed following the standard protocol without intravenous contrast. Multiplanar CT image reconstructions of the cervical spine were also generated. COMPARISON:  MRI head 07/01/2017. MRA head 07/09/2017. Prior CT head examinations 06/16/2017 and earlier. Radiographs of the cervical spine 07/04/2011. MRI of the cervical spine 12/02/2005. CT chest 05/18/2020. 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 Moderate cerebral atrophy with associated prominence of ventricles and sulci. Comparatively mild cerebellar atrophy. Advanced chronic small vessel ischemic disease within the cerebral white matter, thalami and pons. There is no acute intracranial hemorrhage. No demarcated cortical infarct. No extra-axial fluid collection. No evidence of intracranial mass. No midline shift. Vascular: No hyperdense vessel. 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 Atherosclerotic calcifications. Skull: Normal. Negative for fracture or focal lesion. Sinuses/Orbits: Visualized orbits show no acute finding. No significant paranasal sinus disease. Other: Left posterior scalp hematoma and possible laceration. CT  CERVICAL SPINE FINDINGS Alignment: Straightening of the expected cervical lordosis. Trace grade 1 anterolisthesis at C6-C7, C7-T1, T1-T2 and T2-T3. Cervicothoracic dextrocurvature with partially imaged thoracic levocurvature. Skull base and vertebrae: The basion-dental and atlanto-dental intervals are maintained.No evidence of acute fracture to the cervical spine. Soft tissues and spinal canal: No prevertebral fluid or swelling. No visible canal hematoma. Disc levels: Cervical spondylosis with multilevel disc space narrowing, disc bulges and facet arthrosis. Upper chest: No consolidation within the imaged lung apices. Redemonstrated hyperdense pleuroparenchymal scarring within the left lung apex. Other: 1.6 cm left thyroid lobe nodule. IMPRESSION: CT head: 1. No evidence of acute intracranial abnormality. 2. Left posterior scalp hematoma with possible laceration. 3. Stable, advanced chronic small vessel ischemic changes within the cerebral white matter, thalami and pons. 4. Redemonstrated moderate cerebral atrophy. 5. Prior anterior communicating artery aneurysm coiling. CT cervical spine: 1. No evidence of acute fracture to the cervical spine. 2. Trace grade 1 anterolisthesis at C6-C7, C7-T1, T1-T2 and T2-T3. 3. Cervicothoracic dextrocurvature with partially imaged thoracic levocurvature. 4. Cervical spondylosis as described. 5. 1.6 cm left thyroid lobe nodule. Nonemergent thyroid ultrasound is recommended for further evaluation. Electronically Signed   By: Kellie Simmering DO   On: 08/14/2020 14:51   CT Cervical Spine Wo Contrast  Result Date: 08/14/2020 CLINICAL DATA:  Head trauma, minor. Neck trauma. Additional history provided: Fall. EXAM: CT HEAD WITHOUT CONTRAST CT CERVICAL SPINE WITHOUT CONTRAST TECHNIQUE: Multidetector CT imaging of the head and cervical spine was performed following the standard protocol without intravenous contrast. Multiplanar CT image reconstructions of the cervical spine were also  generated. COMPARISON:  MRI head 07/01/2017. MRA head 07/09/2017. Prior CT head examinations 06/16/2017 and earlier. Radiographs of the cervical spine 07/04/2011. MRI of the cervical spine 12/02/2005. CT chest 05/18/2020. 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 Moderate cerebral atrophy with associated prominence of ventricles and sulci. Comparatively mild cerebellar atrophy. Advanced chronic small vessel ischemic disease within the cerebral white matter, thalami and pons. There is no acute intracranial  hemorrhage. No demarcated cortical infarct. No extra-axial fluid collection. No evidence of intracranial mass. No midline shift. Vascular: No hyperdense vessel. 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 Atherosclerotic calcifications. Skull: Normal. Negative for fracture or focal lesion. Sinuses/Orbits: Visualized orbits show no acute finding. No significant paranasal sinus disease. Other: Left posterior scalp hematoma and possible laceration. CT CERVICAL SPINE FINDINGS Alignment: Straightening of the expected cervical lordosis. Trace grade 1 anterolisthesis at C6-C7, C7-T1, T1-T2 and T2-T3. Cervicothoracic dextrocurvature with partially imaged thoracic levocurvature. Skull base and vertebrae: The basion-dental and atlanto-dental intervals are maintained.No evidence of acute fracture to the cervical spine. Soft tissues and spinal canal: No prevertebral fluid or swelling. No visible canal hematoma. Disc levels: Cervical spondylosis with multilevel disc space narrowing, disc bulges and facet arthrosis. Upper chest: No consolidation within the imaged lung apices. Redemonstrated hyperdense pleuroparenchymal scarring within the left lung apex. Other: 1.6 cm left thyroid lobe nodule. IMPRESSION: CT head: 1. No evidence of acute intracranial  abnormality. 2. Left posterior scalp hematoma with possible laceration. 3. Stable, advanced chronic small vessel ischemic changes within the cerebral white matter, thalami and pons. 4. Redemonstrated moderate cerebral atrophy. 5. Prior anterior communicating artery aneurysm coiling. CT cervical spine: 1. No evidence of acute fracture to the cervical spine. 2. Trace grade 1 anterolisthesis at C6-C7, C7-T1, T1-T2 and T2-T3. 3. Cervicothoracic dextrocurvature with partially imaged thoracic levocurvature. 4. Cervical spondylosis as described. 5. 1.6 cm left thyroid lobe nodule. Nonemergent thyroid ultrasound is recommended for further evaluation. Electronically Signed   By: Kellie Simmering DO   On: 08/14/2020 14:51   DG Shoulder Left  Result Date: 08/14/2020 CLINICAL DATA:  Fall on blood thinners. EXAM: LEFT SHOULDER - 2+ VIEW COMPARISON:  None. FINDINGS: There is no evidence of fracture or dislocation. There is no evidence of arthropathy or other focal bone abnormality. Included left lung is clear. Soft tissues are unremarkable. IMPRESSION: Negative radiographs of the left shoulder. Electronically Signed   By: Keith Rake M.D.   On: 08/14/2020 15:08    Procedures .Marland KitchenLaceration Repair  Date/Time: 08/14/2020 4:50 PM Performed by: Janeece Fitting, PA-C Authorized by: Janeece Fitting, PA-C   Consent:    Consent obtained:  Verbal   Consent given by:  Patient   Risks discussed:  Pain, infection, poor cosmetic result and poor wound healing Universal protocol:    Patient identity confirmed:  Verbally with patient Anesthesia:    Anesthesia method:  None Laceration details:    Location:  Scalp   Scalp location:  Crown   Length (cm):  5   Depth (mm):  0.5 Exploration:    Limited defect created (wound extended): no   Treatment:    Area cleansed with:  Saline   Amount of cleaning:  Extensive   Irrigation solution:  Sterile saline   Irrigation method:  Pressure wash and syringe Skin repair:    Repair  method:  Staples   Number of staples:  3 Approximation:    Approximation:  Close Repair type:    Repair type:  Simple Post-procedure details:    Dressing:  Open (no dressing)   Procedure completion:  Tolerated well, no immediate complications   (including critical care time)  Medications Ordered in ED Medications  fentaNYL (SUBLIMAZE) injection 50 mcg (50 mcg Intravenous Given 08/14/20 1620)  sodium chloride 0.9 % bolus 1,000 mL (1,000 mLs Intravenous New Bag/Given 08/14/20 1643)    ED Course  I have reviewed the triage  vital signs and the nursing notes.  Pertinent labs & imaging results that were available during my care of the patient were reviewed by me and considered in my medical decision making (see chart for details).    MDM Rules/Calculators/A&P  84-year-old female with extensive past medical history presents to the ED status post fall.  According to patient she was washing her hands when she suddenly fell backwards hitting her head onto the shower.  She is currently on Xarelto for a DVT to her right lower leg.  Recent hospital visit due to weakness, along with right-sided DVT which has been found stable.  Patient is placed on Aspen collar by EMS, does have a laceration present to the back of her head, bilateral skin tears.  She is at her baseline per caretaker at the bedside [name], will obtain imaging along with further studies to further evaluate.  Blood pressure is normotensive on today's visit, orthostatics were positive while in transportation.   CT Head/Neck: 1. No evidence of acute fracture to the cervical spine.  2. Trace grade 1 anterolisthesis at C6-C7, C7-T1, T1-T2 and T2-T3.  3. Cervicothoracic dextrocurvature with partially imaged thoracic  levocurvature.  4. Cervical spondylosis as described.  5. 1.6 cm left thyroid lobe nodule. Nonemergent thyroid ultrasound  is recommended for further evaluation.   Chest x-ray without any abnormal findings.  Shoulder  x-ray without any acute fracture.  I have personally repaired patient's wound to the back of her head, 3 staples were placed.  Bleeding was minimal although patient is on blood thinners.  She had also both of her dressings removed from bilateral elbows, minimal skin tears noted to both, will apply nonadhesive dressing to patient after extensive irrigation.  Given fentanyl along with bolus for pain.C collar removed by me.   5:17 PM nonadherent was also placed on patient's skin tears to bilateral elbows.  Patient is stable for discharge back to nursing home.  Care was discussed with caretaker Manuela Schwartz at the bedside.  Return precautions discussed at length.   Portions of this note were generated with [employer]. Dictation errors may occur despite best attempts at proofreading.  Final Clinical Impression(s) / ED Diagnoses Final diagnoses:  Fall, initial encounter  Injury of head, initial encounter  Laceration of scalp, initial encounter    Rx / DC Orders ED Discharge Orders    None       Janeece Fitting, PA-C 08/14/20 [place], MD 08/16/20 1011

## 2020-08-15 ENCOUNTER — Encounter: Payer: Self-pay | Admitting: Internal Medicine

## 2020-10-23 ENCOUNTER — Encounter: Payer: Self-pay | Admitting: Internal Medicine

## 2020-10-24 ENCOUNTER — Encounter: Payer: Self-pay | Admitting: *Deleted

## 2020-10-25 ENCOUNTER — Other Ambulatory Visit: Payer: Self-pay

## 2020-10-25 ENCOUNTER — Non-Acute Institutional Stay: Payer: Medicare Other | Admitting: Internal Medicine

## 2020-10-25 ENCOUNTER — Encounter: Payer: Self-pay | Admitting: Internal Medicine

## 2020-10-25 VITALS — BP 118/60 | HR 64 | Temp 98.4°F | Resp 17 | Ht 65.0 in | Wt 163.6 lb

## 2020-10-25 DIAGNOSIS — R599 Enlarged lymph nodes, unspecified: Secondary | ICD-10-CM | POA: Diagnosis not present

## 2020-10-25 DIAGNOSIS — I82511 Chronic embolism and thrombosis of right femoral vein: Secondary | ICD-10-CM

## 2020-10-25 DIAGNOSIS — M79604 Pain in right leg: Secondary | ICD-10-CM | POA: Diagnosis not present

## 2020-10-25 DIAGNOSIS — H543 Unqualified visual loss, both eyes: Secondary | ICD-10-CM

## 2020-10-25 DIAGNOSIS — F418 Other specified anxiety disorders: Secondary | ICD-10-CM

## 2020-10-25 DIAGNOSIS — C210 Malignant neoplasm of anus, unspecified: Secondary | ICD-10-CM

## 2020-10-25 NOTE — Progress Notes (Signed)
Location:  [facility] Provider:  Lacie Landry L. Mariea Clonts, D.O., C.M.D.  Code Status: DNR, hospice Goals of Care:  Advanced Directives 08/14/2020  Does Patient Have a Medical Advance Directive? No  Type of Advance Directive -  Does patient want to make changes to medical advance directive? -  Copy of [place] in Chart? -  Would patient like information on creating a medical advance directive? No - Patient declined  Pre-existing out of facility DNR order (yellow form or pink MOST form) -   Chief Complaint  Patient presents with  . Medical Management of Chronic Issues    Medical Management of Chronic Issues. 4 Month Follow up    HPI: Patient is a 80 y.o. female seen today for medical management of chronic diseases.    She is doing ok at present.  She admits to considerable confusion when she has received her medications "feeling loopy" which she does not like.  She describes her pain as being a feeling like a roling pin is going up and down her left thigh.  The left leg stays swollen due to her diffuse LLE DVT.  She takes dilaudid for the pain.  She is also on risperdal for agitation and to calm her.    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  She is somewhat unaware of these.    Bowels and bladder are still working ok with current regimen.    Past Medical History:  Diagnosis Date  . Anxiety   . Arthritis   . Cerebral aneurysm    s/p endovascular colling 2008  . CLL (chronic lymphocytic leukemia) (HCC)    CLL  . Depression   . Hearing loss   . Macular degeneration   . Neuropathy   . Paroxysmal atrial fibrillation (HCC)   . Pneumonia    08-04-17  . PONV (postoperative nausea and vomiting)   . Thyroid disease     Past Surgical History:  Procedure Laterality Date  . ANEURYSM COILING    . CATARACT EXTRACTION     bilateral  . CERVICAL CONE BIOPSY     . COLONOSCOPY    . EVALUATION UNDER ANESTHESIA WITH HEMORRHOIDECTOMY N/A 09/03/2017   Procedure: EXAM UNDER ANESTHESIA, EXCISION OF ANAL LESION, POSSIBLE HEMORRHOIDECTOMY;  Surgeon: Ileana Roup, MD;  Location: [facility];  Service: General;  Laterality: N/A;  . LESION REMOVAL N/A 07/22/2018   Procedure: EXCISION OF PERIANAL LESION;  Surgeon: Ileana Roup, MD;  Location: [place];  Service: General;  Laterality: N/A;  . LUMBAR LAMINECTOMY/DECOMPRESSION MICRODISCECTOMY Right 04/07/2018   Procedure: Right Lumbar Four-Five Lumbar Five-Sacral One Laminectomy/Microdiscectomy;  Surgeon: Kristeen Miss, MD;  Location: [place];  Service: Neurosurgery;  Laterality: Right;  Right Lumbar Four-Five Lumbar Five-Sacral One Laminectomy/Microdiscectomy  . RECTAL EXAM UNDER ANESTHESIA N/A 07/22/2018   Procedure: ANORECTAL EXAM UNDER ANESTHESIA ERAS PATHWAY;  Surgeon: Ileana Roup, MD;  Location: [place];  Service: General;  Laterality: N/A;  . THYROIDECTOMY    . TONSILLECTOMY      Allergies  Allergen Reactions  . Ambien [Zolpidem Tartrate] Other (See Comments)    UNSPECIFIED REACTION   . Ambien [Zolpidem] Other (See Comments)    Unknown reaction  . Codeine Other (See Comments)    Caused extreme sedation (about 60 yrs ago)  . Fioricet [Butalbital-Apap-Caffeine] Other (See Comments)    Unknown reaction  . Fioricet-Codeine [Butalbital-Apap-Caff-Cod] Other (See  Comments)    UNSPECIFIED REACTION   . Macrobid [Nitrofurantoin] Other (See Comments)    UNSPECIFIED REACTION   . Macrobid [Nitrofurantoin] Other (See Comments)    Unknown reaction  . Norco [Hydrocodone-Acetaminophen] Nausea And Vomiting  . Oxycodone Nausea And Vomiting  . Percocet [Oxycodone-Acetaminophen] Nausea And Vomiting  . Codeine Other (See Comments)    About 50 years ago, took Codeine for scratched cornea while pregnant, and it made her extremely sedated.  . Hydrocodone-Acetaminophen Nausea And Vomiting  . Keflex [Cephalexin]  Rash  . Percocet [Oxycodone-Acetaminophen] Nausea And Vomiting    Outpatient Encounter Medications as of 10/25/2020  Medication Sig  . acetaminophen (TYLENOL) 500 MG tablet Take 500 mg by mouth See admin instructions. Take one tablet (500 mg) by mouth twice daily, may also take one tablet (500 mg) twice daily as needed for pain  . albuterol (ACCUNEB) 0.63 MG/3ML nebulizer solution Take 1 ampule by nebulization every 6 (six) hours as needed for wheezing.  Marland Kitchen aspirin EC 81 MG tablet Take 81 mg by mouth daily. Swallow whole.  . bisacodyl (DULCOLAX) 5 MG EC tablet Take 10 mg by mouth daily. Hold for loose stools.  . brimonidine-timolol (COMBIGAN) 0.2-0.5 % ophthalmic solution Place 1 drop into the left eye 2 (two) times daily.  Marland Kitchen buPROPion (WELLBUTRIN XL) 150 MG 24 hr tablet Take 150 mg by mouth every morning. Take with a 300 mg tablet for a total dose of 450 mg every morning  . buPROPion (WELLBUTRIN XL) 300 MG 24 hr tablet Take 300 mg by mouth every morning. Take with a 150 mg tablet for a total dose of 450 mg every morning  . diltiazem (CARDIZEM CD) 180 MG 24 hr capsule Take 180 mg by mouth daily.  . Diphenhyd-Hydrocort-Nystatin (FIRST-DUKES MOUTHWASH MT) Use as directed 5 mLs in the mouth or throat 4 (four) times daily as needed (mouth discomfort). Swish and spit.  Marland Kitchen donepezil (ARICEPT) 5 MG tablet Take 5 mg by mouth every other day. Discontinue 10/29/2020  . dorzolamide (TRUSOPT) 2 % ophthalmic solution Place 1 drop into the left eye 3 (three) times daily.  Marland Kitchen estradiol (ESTRACE) 0.1 MG/GM vaginal cream Place 1 Applicatorful vaginally See admin instructions. Apply small amount vaginally three times weekly - Monday, Thursday, Saturday  . Eyelid Cleansers (OCUSOFT EYELID CLEANSING) PADS Place 1 patch into both eyes daily as needed (eye discharge).  . fluticasone (FLONASE) 50 MCG/ACT nasal spray Place 2 sprays into both nostrils daily as needed for allergies or rhinitis.  Marland Kitchen gabapentin (NEURONTIN) 100 MG  capsule Take 100 mg by mouth 2 (two) times daily.  Marland Kitchen HYDROmorphone (DILAUDID) 2 MG tablet Take 2 mg by mouth 4 (four) times daily.  Marland Kitchen ibuprofen (ADVIL) 400 MG tablet Take 400 mg by mouth every 8 (eight) hours as needed.  . lamoTRIgine (LAMICTAL) 150 MG tablet Take 150 mg by mouth at bedtime. For bipolar depression  . latanoprost (XALATAN) 0.005 % ophthalmic solution PLACE 1 DROP INTO THE LEFT EYE AT BEDTIME.  Marland Kitchen loperamide (IMODIUM A-D) 2 MG tablet Take 4 mg by mouth daily as needed for diarrhea or loose stools.  Marland Kitchen LORazepam (ATIVAN) 0.5 MG tablet Take 1 tablet (0.5 mg total) by mouth 2 (two) times daily. (Patient taking differently: Take 0.5 mg by mouth 3 (three) times daily. May also take 1/2 tablet daily as needed for breakthrough anxiety)  . memantine (NAMENDA) 10 MG tablet Take 1 tablet (10 mg total) by mouth at bedtime. (Patient taking differently: Take 10 mg by  mouth every other day. D/C on 10/29/2020)  . ondansetron (ZOFRAN-ODT) 8 MG disintegrating tablet Take 8 mg by mouth 2 (two) times daily as needed for nausea or vomiting.  . phenylephrine-shark liver oil-mineral oil-petrolatum (PREPARATION H) 0.25-14-74.9 % rectal ointment Place 1 application rectally 4 (four) times daily as needed for hemorrhoids.  . polyethylene glycol (MIRALAX / GLYCOLAX) 17 g packet Take 17 g by mouth daily as needed (constipation). Mix in 8 oz fluid  . risperiDONE (RISPERDAL) 0.5 MG tablet Take one tablet by mouth twice daily as needed.  . sennosides-docusate sodium (SENOKOT-S) 8.6-50 MG tablet Take 1 tablet by mouth in the morning and at bedtime. Hold for loose stools or diarrhea.  . sodium chloride (OCEAN) 0.65 % SOLN nasal spray Place 1 spray into both nostrils every hour as needed (dry nose).  Marland Kitchen tiZANidine (ZANAFLEX) 4 MG tablet Take 4 mg by mouth every 6 (six) hours as needed for muscle spasms. Take one tablet by mouth four times daily.  . Vitamins A & D (VITAMIN A & D) ointment Apply 1 application topically 2  (two) times daily. To right groin  . [DISCONTINUED] acetaminophen (TYLENOL) 500 MG tablet Take 500 mg by mouth See admin instructions. 500mg  twice a day scheduled and may take 500mg  twice a day as needed for pain twice daily.  . [DISCONTINUED] albuterol (ACCUNEB) 0.63 MG/3ML nebulizer solution Take 1 ampule by nebulization every 6 (six) hours as needed for wheezing.  . [DISCONTINUED] buPROPion (WELLBUTRIN XL) 150 MG 24 hr tablet Take 150 mg by mouth daily. Take with the 300mg  tablet for total 450mg .  . [DISCONTINUED] buPROPion (WELLBUTRIN XL) 300 MG 24 hr tablet Take 450 mg by mouth daily. Take with the 150mg  tablet for total 450mg .  . [DISCONTINUED] cetirizine (ZYRTEC) 10 MG tablet Take 10 mg by mouth daily as needed for allergies.  . [DISCONTINUED] COMBIGAN 0.2-0.5 % ophthalmic solution Place 1 drop into the left eye 2 (two) times daily.   . [DISCONTINUED] dextromethorphan-guaiFENesin (ROBAFEN DM CGH/CHEST CONGEST) 10-100 MG/5ML liquid Take 10 mLs by mouth every 4 (four) hours as needed for cough.  . [DISCONTINUED] Dextromethorphan-guaiFENesin 10-100 MG/5ML liquid Take 10 mLs by mouth every 6 (six) hours as needed (cough/congestion).  . [DISCONTINUED] diltiazem (CARDIZEM CD) 180 MG 24 hr capsule Take 180 mg by mouth daily.  . [DISCONTINUED] Diphenhyd-Hydrocort-Nystatin (FIRST-DUKES MOUTHWASH MT) Use as directed 5 mLs in the mouth or throat See admin instructions. Swish and sip 5 mls by mouth 4 times daily as needed for mouth discomfort  . [DISCONTINUED] donepezil (ARICEPT) 5 MG tablet Take 5 mg by mouth at bedtime.  . [DISCONTINUED] dorzolamide (TRUSOPT) 2 % ophthalmic solution Place 1 drop into the left eye 3 (three) times daily.   . [DISCONTINUED] estradiol (ESTRACE) 0.1 MG/GM vaginal cream Place 1 Applicatorful vaginally See admin instructions. Administer 3 times a week on Monday, Thursday, and Saturday.  . [DISCONTINUED] Eyelid Cleansers (OCUSOFT EYELID CLEANSING) PADS Apply 1 each topically  daily as needed (discharge in eyes).  . [DISCONTINUED] gabapentin (NEURONTIN) 100 MG capsule Take 200 mg by mouth 2 (two) times daily.  . [DISCONTINUED] hydrocortisone (ANUSOL-HC) 25 MG suppository Place 25 mg rectally 2 (two) times daily as needed for hemorrhoids or anal itching.  . [DISCONTINUED] hydrocortisone (ANUSOL-HC) 25 MG suppository Place 25 mg rectally 2 (two) times daily as needed for hemorrhoids or anal itching.  . [DISCONTINUED] ibuprofen (ADVIL) 400 MG tablet Take 400 mg by mouth every 8 (eight) hours as needed for moderate pain.  . [  DISCONTINUED] lamoTRIgine (LAMICTAL) 150 MG tablet Take 1 tablet (150 mg total) by mouth at bedtime.  . [DISCONTINUED] latanoprost (XALATAN) 0.005 % ophthalmic solution Place 1 drop into the left eye at bedtime.  . [DISCONTINUED] Lidocaine (ASPERCREME LIDOCAINE) 4 % PTCH Apply 1 patch topically daily as needed (for lower back / right hip pain).   . [DISCONTINUED] lidocaine (XYLOCAINE) 2 % jelly Apply 1 application topically every 8 (eight) hours as needed (to vaginal area for catheter discomfort).   . [DISCONTINUED] lidocaine (XYLOCAINE) 2 % jelly 1 application by Other route See admin instructions. Apply topically to vaginal area three times daily as needed for catheter discomfort  . [DISCONTINUED] Lidocaine 4 % PTCH Place 1 patch onto the skin daily as needed (lower back pain).  . [DISCONTINUED] loperamide (IMODIUM) 2 MG capsule Take 4 mg by mouth daily as needed for diarrhea or loose stools.  . [DISCONTINUED] LORazepam (ATIVAN) 0.5 MG tablet Take 0.5 tablets (0.25 mg total) by mouth daily as needed for anxiety.  . [DISCONTINUED] LORazepam (ATIVAN) 0.5 MG tablet Take 0.25-0.5 mg by mouth See admin instructions. Take one tablet (0.5 mg) by mouth twice daily, may also take 1/2 tablet (0.25 mg) daily as needed for breakthrough anxiety  . [DISCONTINUED] meclizine (ANTIVERT) 25 MG tablet Take 25 mg by mouth 2 (two) times daily as needed for dizziness.  .  [DISCONTINUED] meclizine (ANTIVERT) 25 MG tablet Take 25 mg by mouth 2 (two) times daily as needed for dizziness.  . [DISCONTINUED] memantine (NAMENDA) 10 MG tablet Take 10 mg by mouth every evening.  . [DISCONTINUED] ondansetron (ZOFRAN) 8 MG tablet Take 8 mg by mouth 2 (two) times daily as needed for nausea or vomiting.  . [DISCONTINUED] PHENYLEPHRINE-MINERAL OIL-PET RE Place 1 application rectally 4 (four) times daily as needed (hemorrhoids).  . [DISCONTINUED] polyethylene glycol (MIRALAX / GLYCOLAX) packet Take 17 g by mouth daily as needed for moderate constipation.  . [DISCONTINUED] Probiotic Product (ALIGN) 4 MG CAPS Take 4 mg by mouth daily.   . [DISCONTINUED] Probiotic Product (ALIGN) 4 MG CAPS Take 4 mg by mouth daily.  . [DISCONTINUED] risperiDONE (RISPERDAL) 0.25 MG tablet Take 0.25 mg by mouth 2 (two) times daily.  . [DISCONTINUED] rivaroxaban (XARELTO) 20 MG TABS tablet Take 1 tablet (20 mg total) by mouth daily with supper.  . [DISCONTINUED] rivaroxaban (XARELTO) 20 MG TABS tablet Take 20 mg by mouth at bedtime.  . [DISCONTINUED] senna-docusate (SENOKOT-S) 8.6-50 MG tablet Take 1 tablet by mouth 2 (two) times daily.  . [DISCONTINUED] Wheat Dextrin (BENEFIBER PO) Take 15 mLs by mouth 2 (two) times daily as needed (constipation). Stir 1 tablespoon in 4-8 oz of non- carbonated beverage until dissolved.  . [DISCONTINUED] Wheat Dextrin (BENEFIBER) POWD Take 15 mLs by mouth 2 (two) times daily as needed (constipation). Mix in 4-8 oz of any non-carbonate beverage (hot or cold) and stir well until dissolved.   No facility-administered encounter medications on file as of 10/25/2020.    Review of Systems:  Review of Systems  Constitutional: Positive for malaise/fatigue and weight loss. Negative for chills and fever.  HENT: Positive for hearing loss. Negative for congestion and sore throat.   Eyes: Positive for blurred vision.       Vision nearly gone now--cannot recognize me even close up   Respiratory: Negative for cough and shortness of breath.   Cardiovascular: Positive for leg swelling (RLE). Negative for chest pain and palpitations.  Gastrointestinal: Negative for abdominal pain, blood in stool, constipation, diarrhea, melena  and nausea.  Genitourinary: Negative for dysuria.  Musculoskeletal: Negative for falls and joint pain.  Neurological: Negative for dizziness and loss of consciousness.  Endo/Heme/Allergies: Bruises/bleeds easily.  Psychiatric/Behavioral: Positive for depression, hallucinations and memory loss. The patient is nervous/anxious. The patient does not have insomnia.     Health Maintenance  Topic Date Due  . COVID-19 Vaccine (3 - Inadvertent risk 4-dose series) 11/09/2019  . HEMOGLOBIN A1C  12/10/2019  . FOOT EXAM  12/21/2019  . OPHTHALMOLOGY EXAM  03/24/2020  . URINE MICROALBUMIN  06/09/2020  . TETANUS/TDAP  12/20/2026  . INFLUENZA VACCINE  Completed  . DEXA SCAN  Completed  . PNA vac Low Risk Adult  Completed  . HPV VACCINES  Aged Out    Physical Exam: Vitals:   10/25/20 1418  BP: 118/60  Pulse: 64  Resp: 17  Temp: 98.4 F (36.9 C)  TempSrc: Oral  SpO2: (!) 88%  Weight: 163 lb 9.6 oz (74.2 kg)  Height: 5\' 5"  (1.651 m)   Body mass index is 27.22 kg/m. Physical Exam Vitals and nursing note reviewed.  Constitutional:      General: She is not in acute distress.    Appearance: She is not toxic-appearing.     Comments: Weight loss notable on exam  HENT:     Head: Normocephalic and atraumatic.     Ears:     Comments: HOH despite hearing aids Eyes:     Conjunctiva/sclera: Conjunctivae normal.     Comments: Minimal vision  Cardiovascular:     Rate and Rhythm: Normal rate and regular rhythm.  Pulmonary:     Effort: Pulmonary effort is normal.     Breath sounds: Normal breath sounds. No rales.  Abdominal:     General: Bowel sounds are normal.     Palpations: Abdomen is soft.     Tenderness: There is no abdominal tenderness. There  is no guarding or rebound.  Musculoskeletal:        General: Normal range of motion.     Right lower leg: Edema present.     Left lower leg: No edema.     Comments: RLE 3+ edema  Skin:    Comments: Right groin with two visible nodules with one draining foul-smelling drainage, not painful during exam  Neurological:     General: No focal deficit present.     Mental Status: She is alert.     Motor: Weakness present.     Comments: Resting comfortably in recliner, oriented to person, place, familiar staff, caregivers  Psychiatric:        Mood and Affect: Mood normal.     Labs reviewed: Basic Metabolic Panel: Recent Labs    04/09/20 2359 05/18/20 2039 05/18/20 2110 08/14/20 1354  NA 140 140 142 140  K 4.3 4.3 4.2 4.4  CL 108 107 106 107  CO2 23 22  --  22  GLUCOSE 94 88 83 129*  BUN 11 10 13 13   CREATININE 0.69 0.77 0.70 0.98  CALCIUM 8.6* 8.9  --  9.0  MG  --  2.0  --   --    Liver Function Tests: Recent Labs    04/09/20 2359 05/18/20 2039 08/14/20 1354  AST 15 21 18   ALT 9 12 9   ALKPHOS 51 50 59  BILITOT 0.5 0.5 0.6  PROT 6.3* 5.8* 6.5  ALBUMIN 3.3* 3.5 3.4*   No results for input(s): LIPASE, AMYLASE in the last 8760 hours. No results for input(s): AMMONIA in the last  8760 hours. CBC: Recent Labs    04/09/20 2359 05/18/20 2039 05/18/20 2110 08/14/20 1354  WBC 11.9* 11.6*  --  11.7*  NEUTROABS 3.8 3.3  --  4.1  HGB 12.4 12.8 13.3 13.6  HCT 37.1 39.7 39.0 43.7  MCV 98.4 100.3*  --  101.2*  PLT 125* 166  --  199   Lipid Panel: No results for input(s): CHOL, HDL, LDLCALC, TRIG, CHOLHDL, LDLDIRECT in the last 8760 hours. Lab Results  Component Value Date   HGBA1C 5.4 06/11/2019    Procedures since last visit: No results found.  Assessment/Plan 1. Right leg pain -due to DVT and some obstructive pathology with lymphadenopathy from anal ca mets -cont dilaudid per hospice recs  2. Anal cancer ([place]) -has now metastasized to inguinal nodes with  lesions in groin -cont comfort-based treatment per her goals of care  3. Adenopathy -secondary to #2 -denies associated local pain  4. 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 Low vision, both eyes -progressively worse due to macular degeneration  6. Depression with anxiety -under reasonable control especially in view of her current illness -she misses her husband who passed away several years ago -risperdal had been scheduled but was made prn due to level of sedation after dilaudid initially adjusted and nursing concerned on the weekend about resident's lethargy  Labs/tests ordered: none, cont comfort care  Next appt:  Prn in her apt  Bantam L. Lexi Conaty, D.O. [facility] [address], [place] [address] Cell Phone (Mon-Fri 8am-5pm):  [phone] On Call:  [phone] & follow prompts after 5pm & weekends Office Phone:  [phone] Office Fax:  [phone]

## 2020-11-06 ENCOUNTER — Other Ambulatory Visit: Payer: Self-pay | Admitting: Adult Health

## 2020-11-06 MED ORDER — LORAZEPAM 0.5 MG PO TABS: 0.5000 mg | ORAL_TABLET | Freq: Three times a day (TID) | ORAL | 2 refills | Status: DC

## 2020-11-15 ENCOUNTER — Other Ambulatory Visit: Payer: Self-pay | Admitting: *Deleted

## 2020-11-15 MED ORDER — LORAZEPAM 1 MG PO TABS: 1.0000 mg | ORAL_TABLET | Freq: Four times a day (QID) | ORAL | 1 refills | Status: AC

## 2020-11-15 NOTE — Telephone Encounter (Signed)
Received refill Request from [facility].  Medication was different in patient's chart from what was being requested.   Reviewed Matrix: Ativan (lorazepam) - Schedule IV tablet; 1 mg; amt: 1 mg; oral Special Instructions: Anxiety Four Times A Day 08:00 AM, 12:00 PM, 04:00 PM, 08:00 PM 11/06/2020- Open Ended   Medication updated and Pended and sent to Dr. Mariea Clonts for approval.

## 2020-11-29 IMAGING — PT NM PET TUM IMG INITIAL (PI) SKULL BASE T - THIGH
1 of 8 series · 1 of 25 positions shown · non-contrast
Comparison: None.

CLINICAL DATA: Initial treatment strategy for anal cancer.

EXAM:
NUCLEAR MEDICINE PET SKULL BASE TO THIGH
TECHNIQUE: 7.6 mCi F-18 FDG was injected intravenously. Full-ring PET imaging
was performed from the skull base to thigh after the radiotracer. CT
data was obtained and used for attenuation correction and anatomic
localization.
Fasting blood glucose: 94 mg/dl

[Series 4: ct sk_thigh 5.0 b31f · axial · 5.0mm · 0.98mm/px · 1 of 226 slices shown]
[im 226/226  brain]
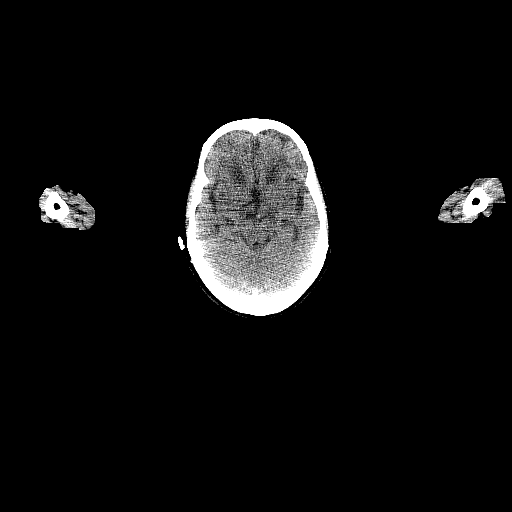

[1 of 25 positions shown; findings below may reference images not displayed]

FINDINGS: Mediastinal blood pool activity: SUV max

NECK: No hypermetabolic lymph nodes in the neck.

Incidental CT findings: none

CHEST: Hypermetabolic calcified left-sided pleural plaques evident.
Patient had a chest CT 09/06/2014 documenting no interval change in
the calcified pleural plaques. No calcified right pleural plaques.

Incidental CT findings: Tiny left thyroid nodules, similar to prior.
Atherosclerotic calcification is noted in the wall of the thoracic
aorta. Coronary artery calcification is evident.

ABDOMEN/PELVIS: No abnormal hypermetabolic activity within the
liver, pancreas, adrenal glands, or spleen. No hypermetabolic lymph
nodes in the abdomen.

2.0 cm short axis right groin lymph node is hypermetabolic with SUV
max = 15.

Hypermetabolism identified right anal wall with SUV max = 8.6.

Incidental CT findings: Stable cyst anterior right liver. There is
abdominal aortic atherosclerosis without aneurysm. Diverticular
changes noted left colon.

3.9 x 2.3 cm left adnexal cyst was 3.6 x 2.2 cm on 11/14/2016. No
older comparative imaging available. No hypermetabolism associated
with this lesion.

Left groin hernia contains only fat.

SKELETON: No focal hypermetabolic activity to suggest skeletal
metastasis.

Incidental CT findings: No worrisome lytic or sclerotic osseous
abnormality.
IMPRESSION: 1. Focal hypermetabolism in the right anus consistent with known
primary tumor.
2. Hypermetabolic right inguinal metastatic lymph node. No other
metastatic disease identified in the neck, chest, abdomen, or
pelvis.
3. Hypermetabolic calcified left pleural plaques. These plaques were
visualized on CT chest of 09/06/2014 and are unchanged in the
interval. No right hemithoracic pleural plaques. Given unilateral
disease, prior hemothorax or empyema would be a consideration.
Asbestos related pleural disease is typically a bilateral process.
4. 3.9 x 2.3 cm left adnexal cyst without hypermetabolic activity.
Attention on follow-up recommended.
5.  Aortic Atherosclerois ([id number]-170.0)

## 2020-12-01 DEATH — deceased
